# Patient Record
Sex: Female | Born: 1960
Health system: Southern US, Community
[De-identification: ages and names within clinical notes are randomized; demographics above are authoritative.]

## PROBLEM LIST (undated history)

## (undated) ENCOUNTER — Emergency Department (HOSPITAL_BASED_OUTPATIENT_CLINIC_OR_DEPARTMENT_OTHER): Discharge status: Against Medical Advice | Payer: 59

## (undated) DIAGNOSIS — J449 Chronic obstructive pulmonary disease, unspecified: Secondary | ICD-10-CM

## (undated) DIAGNOSIS — K635 Polyp of colon: Secondary | ICD-10-CM

## (undated) DIAGNOSIS — R011 Cardiac murmur, unspecified: Secondary | ICD-10-CM

## (undated) DIAGNOSIS — K760 Fatty (change of) liver, not elsewhere classified: Secondary | ICD-10-CM

## (undated) DIAGNOSIS — F419 Anxiety disorder, unspecified: Secondary | ICD-10-CM

## (undated) DIAGNOSIS — M1990 Unspecified osteoarthritis, unspecified site: Secondary | ICD-10-CM

## (undated) DIAGNOSIS — I10 Essential (primary) hypertension: Secondary | ICD-10-CM

## (undated) DIAGNOSIS — J45909 Unspecified asthma, uncomplicated: Secondary | ICD-10-CM

## (undated) DIAGNOSIS — F32A Depression, unspecified: Secondary | ICD-10-CM

## (undated) DIAGNOSIS — Z8619 Personal history of other infectious and parasitic diseases: Secondary | ICD-10-CM

## (undated) DIAGNOSIS — R748 Abnormal levels of other serum enzymes: Secondary | ICD-10-CM

## (undated) DIAGNOSIS — Z87442 Personal history of urinary calculi: Secondary | ICD-10-CM

## (undated) DIAGNOSIS — J40 Bronchitis, not specified as acute or chronic: Secondary | ICD-10-CM

## (undated) DIAGNOSIS — K529 Noninfective gastroenteritis and colitis, unspecified: Secondary | ICD-10-CM

## (undated) DIAGNOSIS — K219 Gastro-esophageal reflux disease without esophagitis: Secondary | ICD-10-CM

## (undated) DIAGNOSIS — F329 Major depressive disorder, single episode, unspecified: Secondary | ICD-10-CM

## (undated) HISTORY — DX: Noninfective gastroenteritis and colitis, unspecified: K52.9

## (undated) HISTORY — DX: Gastro-esophageal reflux disease without esophagitis: K21.9

## (undated) HISTORY — DX: Personal history of other infectious and parasitic diseases: Z86.19

## (undated) HISTORY — DX: Cardiac murmur, unspecified: R01.1

## (undated) HISTORY — PX: FRACTURE SURGERY: SHX138

## (undated) HISTORY — DX: Abnormal levels of other serum enzymes: R74.8

## (undated) HISTORY — DX: Polyp of colon: K63.5

## (undated) HISTORY — PX: CHOLECYSTECTOMY: SHX55

## (undated) HISTORY — DX: Fatty (change of) liver, not elsewhere classified: K76.0

## (undated) HISTORY — DX: Unspecified osteoarthritis, unspecified site: M19.90

## (undated) HISTORY — PX: NEPHRECTOMY: SHX65

## (undated) HISTORY — PX: TONSILLECTOMY: SUR1361

## (undated) HISTORY — PX: FETAL RADIO FREQUENCY ABLATION: SHX1614

---

## 1999-07-14 ENCOUNTER — Ambulatory Visit (HOSPITAL_COMMUNITY): Admission: RE | Admit: 1999-07-14 | Discharge: 1999-07-14 | Payer: Self-pay | Admitting: Family Medicine

## 1999-07-14 ENCOUNTER — Encounter: Payer: Self-pay | Admitting: Family Medicine

## 1999-12-03 ENCOUNTER — Ambulatory Visit (HOSPITAL_COMMUNITY): Admission: RE | Admit: 1999-12-03 | Discharge: 1999-12-03 | Payer: Self-pay | Admitting: Family Medicine

## 1999-12-03 ENCOUNTER — Encounter: Payer: Self-pay | Admitting: Family Medicine

## 2000-06-09 ENCOUNTER — Other Ambulatory Visit: Admission: RE | Admit: 2000-06-09 | Discharge: 2000-06-09 | Payer: Self-pay | Admitting: *Deleted

## 2001-03-21 ENCOUNTER — Ambulatory Visit (HOSPITAL_BASED_OUTPATIENT_CLINIC_OR_DEPARTMENT_OTHER): Admission: RE | Admit: 2001-03-21 | Discharge: 2001-03-21 | Payer: Self-pay | Admitting: Orthopaedic Surgery

## 2001-07-19 ENCOUNTER — Ambulatory Visit (HOSPITAL_BASED_OUTPATIENT_CLINIC_OR_DEPARTMENT_OTHER): Admission: RE | Admit: 2001-07-19 | Discharge: 2001-07-20 | Payer: Self-pay | Admitting: Orthopedic Surgery

## 2001-07-19 ENCOUNTER — Encounter: Payer: Self-pay | Admitting: Emergency Medicine

## 2003-01-08 ENCOUNTER — Encounter: Payer: Self-pay | Admitting: Family Medicine

## 2003-01-08 ENCOUNTER — Encounter: Admission: RE | Admit: 2003-01-08 | Discharge: 2003-01-08 | Payer: Self-pay | Admitting: Family Medicine

## 2003-01-21 ENCOUNTER — Encounter: Payer: Self-pay | Admitting: General Surgery

## 2003-01-24 ENCOUNTER — Encounter: Payer: Self-pay | Admitting: General Surgery

## 2003-01-24 ENCOUNTER — Ambulatory Visit (HOSPITAL_COMMUNITY): Admission: RE | Admit: 2003-01-24 | Discharge: 2003-01-25 | Payer: Self-pay | Admitting: General Surgery

## 2003-01-24 ENCOUNTER — Encounter (INDEPENDENT_AMBULATORY_CARE_PROVIDER_SITE_OTHER): Payer: Self-pay | Admitting: Specialist

## 2004-12-31 ENCOUNTER — Encounter: Admission: RE | Admit: 2004-12-31 | Discharge: 2005-03-31 | Payer: Self-pay | Admitting: Family Medicine

## 2005-07-07 ENCOUNTER — Ambulatory Visit: Payer: Self-pay | Admitting: Pulmonary Disease

## 2005-12-03 ENCOUNTER — Ambulatory Visit (HOSPITAL_COMMUNITY): Admission: RE | Admit: 2005-12-03 | Discharge: 2005-12-03 | Payer: Self-pay | Admitting: Family Medicine

## 2006-05-04 ENCOUNTER — Ambulatory Visit: Payer: Self-pay | Admitting: Internal Medicine

## 2006-05-20 ENCOUNTER — Ambulatory Visit: Payer: Self-pay | Admitting: Internal Medicine

## 2006-05-20 LAB — PULMONARY FUNCTION TEST

## 2006-05-23 ENCOUNTER — Ambulatory Visit: Payer: Self-pay | Admitting: Internal Medicine

## 2007-04-06 HISTORY — PX: JOINT REPLACEMENT: SHX530

## 2008-10-21 ENCOUNTER — Inpatient Hospital Stay (HOSPITAL_COMMUNITY): Admission: RE | Admit: 2008-10-21 | Discharge: 2008-10-24 | Payer: Self-pay | Admitting: Orthopedic Surgery

## 2009-03-12 ENCOUNTER — Ambulatory Visit (HOSPITAL_COMMUNITY): Admission: RE | Admit: 2009-03-12 | Discharge: 2009-03-12 | Payer: Self-pay | Admitting: Family Medicine

## 2009-06-24 ENCOUNTER — Emergency Department (HOSPITAL_COMMUNITY): Admission: EM | Admit: 2009-06-24 | Discharge: 2009-06-24 | Payer: Self-pay | Admitting: Family Medicine

## 2010-07-12 LAB — CBC
HCT: 36.9 % (ref 36.0–46.0)
Hemoglobin: 10.5 g/dL — ABNORMAL LOW (ref 12.0–15.0)
Hemoglobin: 12.1 g/dL (ref 12.0–15.0)
MCHC: 33.4 g/dL (ref 30.0–36.0)
MCHC: 33.6 g/dL (ref 30.0–36.0)
Platelets: 225 10*3/uL (ref 150–400)
Platelets: 244 10*3/uL (ref 150–400)
Platelets: 245 10*3/uL (ref 150–400)
RBC: 3.47 MIL/uL — ABNORMAL LOW (ref 3.87–5.11)
RBC: 3.81 MIL/uL — ABNORMAL LOW (ref 3.87–5.11)
RDW: 13.9 % (ref 11.5–15.5)
RDW: 14.1 % (ref 11.5–15.5)
RDW: 14.5 % (ref 11.5–15.5)
RDW: 14.3 % (ref 11.5–15.5)
WBC: 7.8 10*3/uL (ref 4.0–10.5)
WBC: 5.6 10*3/uL (ref 4.0–10.5)

## 2010-07-12 LAB — BASIC METABOLIC PANEL
BUN: 1 mg/dL — ABNORMAL LOW (ref 6–23)
BUN: 4 mg/dL — ABNORMAL LOW (ref 6–23)
CO2: 31 mEq/L (ref 19–32)
Calcium: 8.2 mg/dL — ABNORMAL LOW (ref 8.4–10.5)
Calcium: 8.4 mg/dL (ref 8.4–10.5)
Calcium: 8.4 mg/dL (ref 8.4–10.5)
Creatinine, Ser: 0.49 mg/dL (ref 0.4–1.2)
Creatinine, Ser: 0.53 mg/dL (ref 0.4–1.2)
Creatinine, Ser: 0.62 mg/dL (ref 0.4–1.2)
GFR calc Af Amer: >60 mL/min (ref >60)
GFR calc Af Amer: >60 mL/min (ref >60)
GFR calc non Af Amer: >60 mL/min (ref >60)
GFR calc non Af Amer: >60 mL/min (ref >60)
Glucose, Bld: 108 mg/dL — ABNORMAL HIGH (ref 70–99)
Potassium: 3.2 mEq/L — ABNORMAL LOW (ref 3.5–5.1)
Sodium: 140 mEq/L (ref 135–145)

## 2010-07-12 LAB — COMPREHENSIVE METABOLIC PANEL
ALT: 53 U/L — ABNORMAL HIGH (ref 0–35)
Albumin: 3.3 g/dL — ABNORMAL LOW (ref 3.5–5.2)
Alkaline Phosphatase: 145 U/L — ABNORMAL HIGH (ref 39–117)
BUN: 12 mg/dL (ref 6–23)
Chloride: 107 mEq/L (ref 96–112)
Potassium: 4 mEq/L (ref 3.5–5.1)
Sodium: 141 mEq/L (ref 135–145)
Total Bilirubin: 0.8 mg/dL (ref 0.3–1.2)
Total Protein: 6.6 g/dL (ref 6.0–8.3)

## 2010-07-12 LAB — URINALYSIS, ROUTINE W REFLEX MICROSCOPIC
Bilirubin Urine: NEGATIVE
Glucose, UA: NEGATIVE mg/dL
Hgb urine dipstick: NEGATIVE
Specific Gravity, Urine: 1.017 (ref 1.005–1.030)
pH: 7.5 (ref 5.0–8.0)

## 2010-07-12 LAB — PROTIME-INR
INR: 1.1 (ref 0.00–1.49)
INR: 1.2 (ref 0.00–1.49)
INR: 1.9 — ABNORMAL HIGH (ref 0.00–1.49)
Prothrombin Time: 14 seconds (ref 11.6–15.2)
Prothrombin Time: 15.9 seconds — ABNORMAL HIGH (ref 11.6–15.2)
Prothrombin Time: 23.3 seconds — ABNORMAL HIGH (ref 11.6–15.2)

## 2010-07-12 LAB — PREGNANCY, URINE: Preg Test, Ur: NEGATIVE

## 2010-07-12 LAB — TYPE AND SCREEN
ABO/RH(D): O POS
Antibody Screen: NEGATIVE

## 2010-08-18 NOTE — Discharge Summary (Signed)
[name], [name]                ACCOUNT NO.:  [id number]   MEDICAL RECORD NO.:  [id number]          PATIENT TYPE:  INP   LOCATION:  1601                         FACILITY:  [facility]   PHYSICIAN:  Ollen Gross, M.D.    DATE OF BIRTH:  01/19/1961   DATE OF ADMISSION:  10/21/2008  DATE OF DISCHARGE:  10/24/2008                               DISCHARGE SUMMARY   ADMITTING DIAGNOSES:  1. Osteoarthritis, left knee.  2. Past history of shingles.  3. Hypertension.  4. Hypercholesterolemia.  5. Hiatal hernia.  6. Hemorrhoids.  7. History of renal calculi.  8. History of renal cell carcinoma.  9. Past history of left hip fracture.   DISCHARGE DIAGNOSES:  1. Osteoarthritis, left knee, status post left total knee replacement      arthroplasty.  2. Postoperative hypokalemia.  3. Postoperative hyponatremia.  4. Past history of shingles.  5. Hypertension.  6. Hypercholesterolemia.  7. Hiatal hernia.  8. Hemorrhoids.  9. History of renal calculi.  10.History of renal cell carcinoma.  11.Past history of left hip fracture.   PROCEDURE:  On October 21, 2008, left total knee.  Surgeon Dr. Lequita Halt.  Assistant Avel Peace, P.A.-C.  Spinal anesthesia with Duramorph added.  Tourniquet time of 36 minutes.   CONSULTS:  None.   BRIEF HISTORY:  [name] is a 50 year old female with end-stage  arthritis of the left knee, progressively getting worse with time.  She  has failed nonoperative management including injection and now presents  for a total knee arthroplasty.   LABORATORY DATA:  Preoperative CBC showed a hemoglobin of 12.1,  hematocrit of 36.9, white cell count 5.6, platelets 245.  Chem panel on  admission:  Low albumin of 3.3, elevated AST of 60, elevated ALT of 53,  elevated alkaline phosphatase of 145.  PT/INR 13.1 and 1.0, PTT of 28.  Preop UA was negative.  Serial CBCs were followed throughout the  hospital course.  Hemoglobin dropped down to 11.5, then 10.9, last noted  at 10.5.   Serial BMET were followed.  Sodium did drop from 133 and came  back at 140.  Potassium dropped down to 3.2, last noted at 3.1.  She was  on potassium supplements and would recheck on an outpatient basis.  Serial pro times followed per Coumadin protocol.  Last noted PT/INR 23.3  and 1.9.   X-rays:  Chest x-ray showed no acute cardiopulmonary disease.  No change  from previous study.   EKG September 25, 2008:  Sinus rhythm, probably normal, was confirmed, unable  to read signature.   HOSPITAL COURSE:  The patient admitted to the [facility],  taken to the OR, underwent the above-stated procedure without  complication.  The patient tolerated the procedure well and later  transferred to the recovery room and orthopedic floor on PCA and p.o.  analgesics.  Given 24 hours postoperative IV antibiotics.  Did have  spinal with Duramorph added.  Doing pretty well on the morning of day 1.  Had some pain last night, especially after the spinal wore off.  A  little bit better on  the morning of day 1, started getting up out of  bed.  Hemoglobin was stable.  Sodium was a little low so decreased  fluids.  The patient was doing a little bit better by day 2.  She was up  ambulating over 60 feet.  Dressing changed, incision looked good.  Potassium was a little low so put on potassium supplements.  By day 3,  she was progressing well.  Potassium was still low.  She was on  potassium supplements when she went home.  Recheck on an outpatient  basis but meeting her goals and discharged home.   DISCHARGE INSTRUCTIONS:  1. The patient was discharged home on October 24, 2008.  2. Discharge diagnoses:  Please see above.  3. Discharge medications:  Darvocet, Coumadin and Robaxin.  4. Follow up in 2 weeks.   ACTIVITY:  Total knee protocol.  Home health PT.  Home health nursing.  Weightbearing as tolerated.   DISPOSITION:  Home.   CONDITION ON DISCHARGE:  Improved.      Alexzandrew L. Perkins,  P.A.C.      Ollen Gross, M.D.  Electronically Signed    ALP/MEDQ  D:  10/24/2008  T:  10/24/2008  Job:  [id number]   cc:   Ollen Gross, M.D.  Fax: [phone]   Dalbert Mayotte, M.D.

## 2010-08-18 NOTE — Op Note (Signed)
[name], [name]                ACCOUNT NO.:  [id number]   MEDICAL RECORD NO.:  [id number]          PATIENT TYPE:  INP   LOCATION:  0005                         FACILITY:  [facility]   PHYSICIAN:  Ollen Gross, M.D.    DATE OF BIRTH:  Nov 03, 1960   DATE OF PROCEDURE:  10/21/2008  DATE OF DISCHARGE:                               OPERATIVE REPORT   PREOPERATIVE DIAGNOSIS:  Osteoarthritis, left knee.   POSTOPERATIVE DIAGNOSIS:  Osteoarthritis, left knee.   PROCEDURE:  Left total knee arthroplasty.   SURGEON:  Dr. Lequita Halt.   ASSISTANT:  Avel Peace. P.A.-C.   ANESTHESIA:  Spinal with Duramorph.   ESTIMATED BLOOD LOSS:  Minimal.   DRAINS:  None.   TOURNIQUET TIME:  36 minutes at 300 mmHg.   COMPLICATIONS:  None.   CONDITION:  Stable to recovery.   BRIEF CLINICAL NOTE:  [name] is a 50 year old female who has end-stage  arthritis of the left knee with progressively worsening pain and  dysfunction.  She has failed nonoperative management including  injections and presents now for left total knee arthroplasty.   PROCEDURE IN DETAIL:  After successful administration of spinal  anesthetic, a tourniquet is placed on her left thigh, and left lower  extremity was prepped and draped in the usual sterile fashion.  Extremity is wrapped in Esmarch, knee flexed, tourniquet inflated to 300  mmHg.  Midline incision is made with a 10 blade through subcutaneous  tissue to the level of the extensor mechanism.  A fresh blade is used  make a medial parapatellar arthrotomy.  Soft tissue on the proximal  medial tibia subperiosteally elevated to the joint line with the knife  into the semimembranosus bursa with a Cobb elevator.  Soft tissue  laterally is elevated with attention being paid to avoiding patellar  tendon on the tibial tubercle.  Patella subluxed laterally, knee flexed  90 degrees and ACL and PCL removed.  Drill is used create a starting  hole in the distal femur, and the canal was  thoroughly irrigated.  A 5-  degree left valgus alignment guide is placed and referencing off the  posterior condyles rotations marked and a block pinned to remove 10 mm  off the distal femur.  Distal femoral resection is made with an  oscillating saw.  Sizing block is placed and size 2.5 is most  appropriate.  Rotation is marked off the epicondylar axis.  The size 2.5  cutting block is placed, and the anterior, posterior and chamfer cuts  were made.   The tibia is subluxed forward, and the menisci are removed.  Extramedullary tibial alignment guide is placed referencing proximally  at the medial aspect of the tibial tubercle and distally along the  second metatarsal axis of the tibial crest.  Block is pinned to remove  about 10 mm off the nondeficient lateral side.  Tibial resection is made  with an oscillating saw.  Sizing guide is placed, and size 2.5 is most  appropriate.  The proximal tibia is then prepared with a modular drill  and keel punch for the size 2.5.  Femoral  preparation is then completed  with the intercondylar cut.   Size 2.5 mobile bearing tibial trial, 2.5 posterior stabilized femoral  trial and a 10-mm posterior stabilized rotating platform insert trial  are placed.  With a 10 she, hyperextends a tiny bit.  We subsequently  went to a 12.5 which allowed for full extension with excellent varus-  valgus and anterior-posterior balance throughout full range of motion.  Patella is everted and thickness measured to be 23 mm.  Freehand  resection taken to 13 mm, 35 template is placed, lug holes are drilled,  trial patella is placed and it tracks normally.  Osteophytes are removed  off the posterior femur with the trial in place.  All trials are  removed, and the cut bone surfaces are prepared with pulsatile lavage.  Cement is mixed, and once ready for implantation, the size 2.5 mobile  bearing tibial tray, 2.5 posterior stabilized femur and 35 patella are  cemented in  place, and the patella is held with a clamp.  Trial 12.5-mm  insert is placed, knee held in full extension and all extruded cement  removed.  When the cement is fully hardened, then the permanent 12.5 mm  posterior stabilized rotating platform insert is placed into the tibial  tray.  The wound is copiously irrigated with saline solution and the  FloSeal injected on the posterior capsule, mediolateral gutters and  suprapatellar area.  Moist sponge is placed and tourniquet released for  a total time of 36 minutes.  Sponges removed after 2 minutes.  Minimal  bleeding is encountered.  The bleeding that is encountered is stopped  with electrocautery.  The wound is again irrigated to remove the FloSeal  and then the arthrotomy closed with interrupted #1 PDS.  Flexion against  gravity to 140 degrees.  Subcutaneous tissue closed with interrupted 2-0  Vicryl and subcuticular running 4-0 Monocryl.  The incision is then  cleaned and dried, and Steri-Strips and a bulky sterile dressing are  applied.  She is then placed into a knee immobilizer, awakened and  transferred to recovery in stable condition.      Ollen Gross, M.D.  Electronically Signed     FA/MEDQ  D:  10/21/2008  T:  10/21/2008  Job:  [id number]

## 2010-08-21 NOTE — Op Note (Signed)
NAME:  [name], [name]                          ACCOUNT NO.:  [id number]   MEDICAL RECORD NO.:  [id number]                   PATIENT TYPE:  OIB   LOCATION:  2888                                 FACILITY:  [facility]   PHYSICIAN:  Adolph Pollack, M.D.            DATE OF BIRTH:  1960/06/30   DATE OF PROCEDURE:  01/24/2003  DATE OF DISCHARGE:                                 OPERATIVE REPORT   PREOPERATIVE DIAGNOSES:  Symptomatic cholelithiasis and elevated liver  function tests.   POSTOPERATIVE DIAGNOSES:  Symptomatic cholelithiasis and elevated liver  function tests.   PROCEDURE:  1. Laparoscopic cholecystectomy with intraoperative cholangiogram.  2. A wedge liver biopsy.   SURGEON:  Adolph Pollack, M.D.   ASSISTANT:  Anselm Pancoast. Zachery Dakins, M.D.   ANESTHESIA:  General.   INDICATIONS FOR PROCEDURE:  The patient is a 50 year old female with biliary  colic-type pain.  She is noted to have elevation of alkaline phosphatase,  AST, ALT and cholesterol.  An ultrasound performed demonstrated multiple  gallstones, but no common bile duct dilatation, and no gallbladder wall  thickening.  She presents now for an elective cholecystectomy.  We also  discussed, in the holding area, the possibility of needing to do a liver  biopsy, if we could not explain her findings by way of a cholangiogram or  during the cholecystectomy.   DESCRIPTION OF PROCEDURE:  She is seen in the holding area and then brought  to the operating room and placed supine on the operating room table, and a  general anesthetic was administered.  Her abdominal wall was sterilely  prepped and draped.  Local anesthetic consisting of dilute Marcaine was  infiltrated in the subumbilical region, and a longitudinal small  subumbilical scar was made, incising the skin and  the subcutaneous tissue  sharply until the midline fascia was identified.  A small incision was made  in the midline fascia, and then using blunt  dissection, the peritoneal  cavity was entered under direct vision.  A pursestring suture of #0 Vicryl  was placed around the fascial edges.  A Hasson trocar was introduced into  the peritoneal cavity, and a pneumoperitoneum was created by the  insufflation of CO2 gas.  The laparoscope was introduced, and she was placed in a reverse  Trendelenburg position with the right side tilted slightly up.  Under direct  vision, a #11 mm trocar was placed through an epigastric incision, and two 5  mm trocars were placed through the right mid-lateral abdomen.  The fundus of  the gallbladder was grasped, and then adhesions between the duodenum and  omentum and the body of the gallbladder were noted and were lysed sharply.  The fundus was then retracted toward the right shoulder, and the  infundibulum grasped.  Using careful blunt dissection and select cautery,  the infundibulum was mobilized.  There is an anterior branch of the cystic  artery coursing  over the cystic duct, that was clipped and divided.  A  window was then created around the cystic duct, and a clip placed at the  cystic duct/gallbladder junction.  A small incision was created in the  cystic duct.  A cholangiocatheter was passed through the anterior abdominal  wall and placed into the cystic duct, and a cholangiogram was performed.  Under real time fluoroscopy dilute contrast material was injected into the  cystic duct, which was of moderate length.  The common hepatic, right and  left hepatic, and the common bile ducts all filled promptly, and contrast  was splashed into the duodenum promptly from the common bile duct without  obvious evidence of obstruction.  The final report is pending the  radiologist's interpretation.  The cholangiocatheter was removed.  The cystic duct was clipped three times,  on the staying inside, and then divided sharply.  The posterior branch of  the cystic artery was identified, clipped, and divided.  The  gallbladder was  then dissected free from the liver bed with electrocautery, and placed in an  Endo Pouch bag.  Because I could not find an obvious reason for  the elevated liver function  tests, based on what I had seen so far, I performed a wedge liver biopsy  bluntly, and placed this in formalin and sent it to pathology.  I then  cauterized the biopsy site.  I inspected the gallbladder fossa.  Bleeding points were controlled with  cautery.  I inspected again and irrigated out the area.  I noticed no  further bleeding.  I noticed no bile leak.  I then evacuated the fluid.  The  gallbladder was then removed in the Endo Pouch bag with the subumbilical  incision.  Under laparoscopic vision the subumbilical fascial defect was  closed by tightening up and tightening down the pursestring suture.  The  remaining trocars were removed, and the pneumoperitoneum was released.  The skin incisions were closed with #4-0 Monocryl subcuticular stitches.  Steri-Strips and sterile dressings were applied.  She tolerated the procedure well without any apparent complications, and was  taken to the recovery room in satisfactory condition.                                                Adolph Pollack, M.D.    Kari Baars  D:  01/24/2003  T:  01/24/2003  Job:  [id number]   cc:   Duncan Dull, M.D.  [address]  [place]  [place] [address]  Fax: [phone]

## 2010-08-21 NOTE — Assessment & Plan Note (Signed)
[facility]                             PULMONARY OFFICE NOTE   NAME:TERRELLHanya, [name]                       MRN:          [id number]  DATE:05/04/2006                            DOB:          [birth year]    PROBLEM:  A 50 year old woman, self referred, for evaluation of  pulmonary nodules and cough.   HISTORY:  She is a nonsmoker who has had several episodes of chest  tightness, shortness of breath, and cough particularly associated with  fall season.  With these episodes she also tends to have nasal  congestion and sneezing.  There was a past history of skin test positive  allergy evaluation and trial of allergy vaccine as a child.  She was  seen in April of 2007 by Dr. Danice Goltz who thought she might be  describing esophageal reflux and mild asthma with rhinitis, treated then  with Advair, Zegerid and Nasonex.  She had a normal spirometry at that  visit with an FEV1 of 117% predicted.  Subsequently, she had workup for  chest pain with CT that ruled out a pulmonary embolism.  I did not quite  follow the sequence, but with these evaluations she was found to have a  mass on her left kidney, and ultimately had a partial left nephrectomy  for renal cell carcinoma.  Her first CT scan had been done at [facility].  A second one was done at [facility] in December of 2007.  We do not  have the radiology reports, but that second CT reportedly showed some  small nodules.  It is not clear if they were seen on the first film done  at a different location.  Her primary concern now is with the nodules,  given her history of renal cell carcinoma.  Currently, she feels well.   MEDICATIONS:  1. Nexium 40 mg p.r.n.  2. Lexapro 10 mg.  3. Hydrochlorothiazide 25 mg.  4. Advair 250/50.  5. Albuterol resource inhaler.   No medication allergy.   REVIEW OF SYSTEMS:  Cough is productive of white sputum in the mornings.  Acid indigestion, usually prevented with her  Nexium.  Mild dyspnea,  which she thinks is probably normal, noticed mostly when she first tries  to climb 3 flights of stairs in to work in the morning.  She walks about  3 times a week to walk her mother, implying that this is not very  aggressive walking.  She does feel tight in the chest in cold air.  Nasal congestion, some nasal itching, and itching of skin without  visible rash.  She has not noticed bleeding, adenopathy, fever, or  weight loss.   PAST HISTORY:  1. Hypertension.  2. Asthma.  3. Renal cell carcinoma with left partial nephrectomy, July 2007.  4. Allergic rhinitis with allergy vaccine as a child.  5. Esophageal reflux.  6. Chronically elevated liver enzymes with negative workup for viral      infections.  She understands diagnosis was fatty liver.  7. Surgery for cholecystectomy and her nephrectomy.  8. Tuberculosis skin test negative, no history of  pneumonia.  No      history of blood clots or heart disease.   SOCIAL HISTORY:  Never smoked, no alcohol.  Divorced with a son who is  in his early 64s.  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   FAMILY HISTORY:  Her son is on allergy vaccine per Dr. Lucie Leather.  Father  and sister have allergy histories.  Father with asthma and heart  disease.  Maternal grandmother with rheumatism.  Father with prostate  cancer.   OBJECTIVE:  Weight 189 pounds, BP 122/78, pulse 75, room air saturation  98%.  She is overweight, pleasant, and seemingly quite comfortable.  SKIN:  No rash.  ADENOPATHY:  None found at the neck, shoulders, or axillae.  HEENT:  Crusting mucous in the nose without obstruction.  Pharynx clear,  voice quality normal.  No neck vein distension.  Conjunctivae not  injected.  CHEST:  Quiet, clear lung fields.  No cough or wheeze.  There is a  healed left flank incision.  HEART:  Regular rhythm without murmur or gallop.   EXTREMITIES:  No cyanosis, clubbing, or edema.   RADIOLOGY:  She brings a folder of prior CT scans and ultrasound results  with no reports.  Brief glance through the lung window views on her  chest CT does not show obvious nodules.  I will review the films again  when reports are available.   IMPRESSION:  1. Asthmatic bronchitis.  2. Allergic rhinitis.  3. Left partial nephrectomy for renal cell carcinoma, 2007.  4. Small lung nodules as incidental finding on chest CT.   PLAN:  1. She is going to provide CT scan reports for both prior studies.  2. Schedule return in 3 weeks, at which time we will plan either a      followup CT scan or PET scan going forward.  3. Schedule pulmonary function tests.  4. I will try to get addresses so we can send records to her other      treating physicians.     Clinton D. Maple Hudson, MD, Tonny Bollman, FACP  Electronically Signed    CDY/MedQ  DD: 05/04/2006  DT: 05/04/2006  Job #: [id number]   cc:   Dalbert Mayotte, M.D.  Bernie Covey, MD  Althea Charon, MD

## 2010-08-21 NOTE — Assessment & Plan Note (Signed)
[facility]                             PULMONARY OFFICE NOTE   NAME:[name], [name]                       MRN:          [id number]  DATE:05/23/2006                            DOB:          [birth year]    PROBLEM:  1. Small lung nodules.  2. Asthmatic bronchitis.  3. Allergic rhinitis.  4. Left partial nephrectomy for renal cell carcinoma 2007.  5. Esophageal reflux.   HISTORY:  She brings the CT scans that she had.  I am not impressed with  the one that I read from December 7, but I do not have the Radiology  reports.  There was hilar prominence noted no chest x-ray of November  2007.  She feels well, but does need to continue using her Advair, and  occasionally needs her albuterol.   MEDICATIONS:  1. Nexium 40 mg.  2. Lexapro 10 mg.  3. Hydrochlorothiazide 25 mg.  4. Advair 250/50.  5. Rescue albuterol inhaler.   NO MEDICATION ALLERGY.   OBJECTIVE:  Weight 191 pounds.  BP 118/64.  Pulse 69.  Room air  saturation 98%.  She looks well.  I find no adenopathy.  LUNG FIELDS:  Clear.  HEART:  Sounds normal.  There is no edema.  Pulmonary function test May 20, 2006 showed mild obstructive change  only in small airways where there was significant response to  bronchodilator.  Measured lung volumes and diffusion were normal.  On a  6-minute walk test she went 492 meters with oxygen saturation maintained  and normal blood pressure response.   IMPRESSION:  1. Mild asthma.  2. History of small lung nodules and hilar prominence significant in      this nonsmoker, particularly because of her history of renal cell      carcinoma.   PLAN:  1. She is going to continue Advair, and use it once a day if that is      sufficient.  We will try to get the Radiology reports from her CT      scans, and make a decision about whether she needs      to be followed with CT or could be adequately followed with      occasional chest x-ray.  2. Schedule  return in 6 months, earlier p.r.n.     Clinton D. Maple Hudson, MD, Tonny Bollman, FACP  Electronically Signed    CDY/MedQ  DD: 05/28/2006  DT: 05/28/2006  Job #: [id number]   cc:   Dalbert Mayotte, M.D.

## 2010-08-21 NOTE — Op Note (Signed)
[place]  Patient:    [name], [name] Visit Number: [id number] MRN: [id number]          Service Type: Attending:  Lubertha Basque. Jerl Santos, M.D. Dictated by:   Lubertha Basque Jerl Santos, M.D. Proc. Date: 03/21/01                             Operative Report  PREOPERATIVE DIAGNOSIS:  Left knee chondromalacia.  POSTOPERATIVE DIAGNOSIS:  Left knee chondromalacia.  OPERATION PERFORMED: 1. Left knee chondroplasty medial femoral condyle and patellofemoral joint. 2. Left knee arthroscopic lateral release.  ANESTHESIA:  General.  ATTENDING SURGEON:  Lubertha Basque. Jerl Santos, M.D.  ASSISTANT:  Lindwood Qua, P.A.  INDICATIONS FOR PROCEDURE:  The patient is a 50 year old woman with a long history of left knee pain.  This has made it difficult for her to exercise and it bothers her at rest as well.  She has failed an exercise program and an oral anti-inflammatory.  At this point she is offered an arthroscopy.  The procedure was discussed with the patient and informed operative consent was obtained after discussion of possible complications of reaction to anesthesia and infection.  DESCRIPTION OF PROCEDURE:  The patient was taken to an operating suite where general anesthetic was applied with LMA.  She was then positioned supine and prepped and draped in normal sterile fashion.  After administration of preop intravenous antibiotics, an arthroscopy of the left knee was performed through a total of three portals.  The suprapatellar pouch was benign while the patellofemoral joint did track in a lateral position.  This was addressed with an arthroscopic lateral release through the additional third portal.  Once this was accomplished, the knee cap tracked in a much better position.  She also had some break down of the intertrochlear groove cartilage which was minor, involved some grade three change.  The medial compartment was notable for some grade 2 and grade 3 change  over about half the medial femoral condyle.  This was addressed with a thorough chondroplasty.  The medial meniscus itself was benign.  The ACL and the PCL were intact.  The lateral compartment was completely benign.  The knee was thoroughly irrigated at the end of the case followed by placement of Marcaine with epinephrine and morphine.  Adaptic was placed over her portals followed by dry gauze and a loose Ace wrap.  Estimated blood loss and intraoperative fluids can be obtained from Anesthesia records.  DISPOSITION:  The patient was extubated in the operating room and taken to the recovery room in stable condition.  Plans were for her to go home the same day and to follow up in the office in less than a week.  I will contact her by phone tonight. Dictated by:   Lubertha Basque Jerl Santos, M.D. Attending:  Lubertha Basque. Jerl Santos, M.D. DD:  03/21/01 TD:  03/21/01 Job: [id number]

## 2010-08-21 NOTE — H&P (Signed)
[name], [name]                ACCOUNT NO.:  [id number]   MEDICAL RECORD NO.:  [id number]           PATIENT TYPE:  INP   LOCATION:                               FACILITY:  [facility]   PHYSICIAN:  Ollen Gross, M.D.    DATE OF BIRTH:  [birth year]   DATE OF ADMISSION:  10/21/2008  DATE OF DISCHARGE:                              HISTORY & PHYSICAL   CHIEF COMPLAINT:  Left knee pain.   HISTORY OF PRESENT ILLNESS:  The patient is a 50 year old female who has  seen by Dr. Lequita Halt for ongoing knee pain.  She has been diagnosed with  arthritis that has been progressively getting worse with time.  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  Progressive pain, felt to be a good candidate.  Risks and benefits discussed.  The patient subsequently admitted to the  hospital.   ALLERGIES:  NO KNOWN DRUG ALLERGIES.   CURRENT MEDICATIONS:  Hydrochlorothiazide, potassium, citalopram.   PAST MEDICAL HISTORY:  1. Past history of shingles.  2. Hypertension.  3. Hypercholesterolemia.  4. Hiatal hernia.  5. Hemorrhoids.  6. History of renal calculi.  7. History of renal cell carcinoma.  8. Past history of a left hip fracture.   PAST SURGICAL HISTORY:  1. Tonsillectomy.  2. __________ dilatation.  3. Exploratory lap with ectopic pregnancy.  4. Left lateral release knee scope.  5. Left ankle fracture, ORIF.  6. Gallbladder surgery.  7. Left partial nephrectomy.   FAMILY HISTORY:  Father with arthritis, heart failure, coronary artery  disease and diabetes.  Mother with dementia.   SOCIAL HISTORY:  Married, Public affairs consultant, nonsmoker, one to  two drinks of alcohol per week, one son.   REVIEW OF SYSTEMS:  GENERAL:  No fevers, chills, night sweats.  NEURO:  No seizures, syncope or paralysis.  RESPIRATORY:  No shortness breath,  productive cough or hemoptysis.  CARDIOVASCULAR:  No chest pain, angina,  orthopnea.  GI:  No  nausea, diarrhea, constipation.  GU:  No dysuria,  hematuria or discharge.  MUSCULOSKELETAL:  Joint pain and swelling.   PHYSICAL EXAMINATION:  VITAL SIGNS:  Pulse 76, respirations 14, blood  pressure 142/76.  GENERAL:  A 50 year old white female well-nourished, well-developed, no  acute distress.  She is alert, oriented and cooperative.  HEENT:  Normocephalic/atraumatic, pupils are round and reactive,  oropharynx clear, EOMs intact.  NECK:  Supple.  CHEST: Clear.  HEART:  Regular rate and rhythm with a faint early systolic ejection  murmur noted.  ABDOMEN:  Soft, nontender, bowel sounds present.  RECTAL, BREASTS, GENITALIA:  Not done, not pertinent to present illness.  EXTREMITIES:  Left knee no effusion, marked crepitus, varus malalignment  deformity.   IMPRESSION:  Osteoarthritis of the left knee.   PLAN:  The patient admitted to [facility] to undergo left  total knee replacement arthroplasty.  Surgery will be performed by Dr.  Ollen Gross.      Alexzandrew L. Perkins, P.A.C.      Homero Fellers Aluisio,  M.D.  Electronically Signed    ALP/MEDQ  D:  10/21/2008  T:  10/21/2008  Job:  [id number]   cc:   Dr. Anderson Malta [facility]  Alfonse Flavors, M.D.  Fax: [phone]

## 2010-08-21 NOTE — Op Note (Signed)
[facility]  Patient:    [name], [name] Visit Number: [id number] MRN: [id number]          Service Type: EMS Location: [facility] Attending Physician:  Hanley Seamen Dictated by:   Alinda Deem, M.D. Proc. Date: 07/19/01 Admit Date:  07/19/2001 Discharge Date: 07/19/2001                             Operative Report  PREOPERATIVE DIAGNOSIS:  Left ankle supination external rotation type 4 fracture, subluxation.  POSTOPERATIVE DIAGNOSIS:  Left ankle supination external rotation type 4 fracture, subluxation.  OPERATION PERFORMED:  Open reduction internal fixation using the Depuy small fragment titanium screw and plate set.  A 6-hole one third tubular lateral plate with a single anterior to posterior lag screw.  SURGEON:  Alinda Deem, M.D.  ASSISTANT:  Dorthula Matas, P.A.-C.  ANESTHESIA:  General endotracheal.  ESTIMATED BLOOD LOSS:  Minimal.  FLUID REPLACEMENT:  800 cc crystalloid.  TOURNIQUET TIME:  35 minutes.  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  She presented to the [facility] Emergency Department.  X-rays revealed lateral subluxation of the talus beneath the tibia and she was prepared for surgical intervention to stabilize the ankle, decrease pain and increase function.  DESCRIPTION OF PROCEDURE:  The patient was identified by arm band and taken to the operating room at [facility] where the appropriate anesthetic monitors were attached and general endotracheal anesthesia induced with the patient in the supine position.  Tourniquet was applied high to the left calf. The left lower extremity was prepped and draped in the usual sterile fashion from the toes to the tourniquet.  The limb was wrapped with an Esmarch bandage.  Tourniquet inflated to 300 mHg and we began  the procedure by making an 8 cm lateral incision starting at the tip of the lateral malleolus and going proximally for about 8 cm.  Small bleeders in the skin and subcutaneous tissues were identified and cauterized.  We dissected down to the bone of the fibula using the tenotomy scissors being careful to avoid any significant branches of the superficial peroneal nerve or the sural nerve.  We immediately identified the spiral fracture of the fibula, made a longitudinal incision in the periosteum, reflected it anteriorly and posteriorly at the fracture site and then washed out the fracture site with normal saline solution.  Using a lions jaw clamp, we were able to reduce the fibula fracture and then placed an anterior proximal to posterior distal 3.5 mm cortical lag screw that I believe was 16 mm in length.  A 6-hole one third tubular titanium plate was then contoured to fit the lateral aspect of the fibula and fixed with three proximal bicortical screws, a fourth unicortical screw and then holes 5 and 6 were filled with 4 mm cancellous screws.  C-arm images were taken confirming anatomic reduction of the fracture and an intact mortise joint.  A hook test revealed the syndesmosis to be intact.  At this point the tourniquet was let down.  The wound was washed out with normal saline solution.  The subcutaneous tissue was closed with running 3-0 Vicryl suture and the skin with running interlocking 3-0 nylon suture.  A dressing of Xeroform, 4 x 4 dressing sponges, Webril  and an Ace wrap applied.  The patient was was then placed back in a cam walker boot, awakened and taken to the recovery room without difficulty. Dictated by:   Alinda Deem, M.D. Attending Physician:  Hanley Seamen DD:  07/19/01 TD:  07/20/01 Job: [id number]

## 2010-08-29 ENCOUNTER — Emergency Department (HOSPITAL_COMMUNITY)
Admission: EM | Admit: 2010-08-29 | Discharge: 2010-08-29 | Disposition: A | Discharge status: Home / Self Care | Payer: Commercial Managed Care - PPO | Attending: Emergency Medicine | Admitting: Emergency Medicine

## 2010-08-29 DIAGNOSIS — F410 Panic disorder [episodic paroxysmal anxiety] without agoraphobia: Secondary | ICD-10-CM | POA: Insufficient documentation

## 2010-08-29 DIAGNOSIS — I10 Essential (primary) hypertension: Secondary | ICD-10-CM | POA: Insufficient documentation

## 2010-08-29 DIAGNOSIS — R209 Unspecified disturbances of skin sensation: Secondary | ICD-10-CM | POA: Insufficient documentation

## 2010-08-29 DIAGNOSIS — Z79899 Other long term (current) drug therapy: Secondary | ICD-10-CM | POA: Insufficient documentation

## 2010-08-29 DIAGNOSIS — R000 Tachycardia, unspecified: Secondary | ICD-10-CM | POA: Insufficient documentation

## 2010-08-29 DIAGNOSIS — Z85528 Personal history of other malignant neoplasm of kidney: Secondary | ICD-10-CM | POA: Insufficient documentation

## 2011-04-09 DIAGNOSIS — M170 Bilateral primary osteoarthritis of knee: Secondary | ICD-10-CM | POA: Insufficient documentation

## 2011-04-09 DIAGNOSIS — Z889 Allergy status to unspecified drugs, medicaments and biological substances status: Secondary | ICD-10-CM | POA: Insufficient documentation

## 2011-04-09 DIAGNOSIS — Z85528 Personal history of other malignant neoplasm of kidney: Secondary | ICD-10-CM | POA: Insufficient documentation

## 2011-04-09 DIAGNOSIS — C649 Malignant neoplasm of unspecified kidney, except renal pelvis: Secondary | ICD-10-CM | POA: Insufficient documentation

## 2011-04-09 HISTORY — DX: Bilateral primary osteoarthritis of knee: M17.0

## 2011-05-09 ENCOUNTER — Encounter: Payer: Self-pay | Admitting: *Deleted

## 2011-05-09 ENCOUNTER — Emergency Department (INDEPENDENT_AMBULATORY_CARE_PROVIDER_SITE_OTHER)
Admission: EM | Admit: 2011-05-09 | Discharge: 2011-05-09 | Disposition: A | Discharge status: Home / Self Care | Payer: 59 | Source: Home / Self Care | Attending: Emergency Medicine | Admitting: Emergency Medicine

## 2011-05-09 DIAGNOSIS — R112 Nausea with vomiting, unspecified: Secondary | ICD-10-CM

## 2011-05-09 HISTORY — DX: Depression, unspecified: F32.A

## 2011-05-09 HISTORY — DX: Anxiety disorder, unspecified: F41.9

## 2011-05-09 HISTORY — DX: Major depressive disorder, single episode, unspecified: F32.9

## 2011-05-09 MED ORDER — PROMETHAZINE HCL 12.5 MG PO TABS: 12.5000 mg | ORAL_TABLET | Freq: Four times a day (QID) | ORAL | Status: AC | PRN

## 2011-05-09 NOTE — ED Provider Notes (Signed)
History     CSN: [id number]  Arrival date & time 05/09/11  1140   First MD Initiated Contact with Patient 05/09/11 1303      Chief Complaint  Patient presents with  . Emesis  . Generalized Body Aches    (Consider location/radiation/quality/duration/timing/severity/associated sxs/prior treatment) HPI [name] is a 51 y.o. female who complains of onset of cold symptoms for 1 days. 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 She did have her flu shot about 3-4 months ago. She was exposed to her sister who tested positive for flu about 2 weeks ago.   No sore throat No cough No pleuritic pain No wheezing No nasal congestion No post-nasal drainage No sinus pain/pressure No chest congestion No itchy/red eyes No earache No hemoptysis No SOB No chills/sweats No fever + nausea + vomiting x2 No abdominal pain No diarrhea No skin rashes No fatigue + myalgias No headache     Past Medical History  Diagnosis Date  . Anxiety   . Depression   . Renal cell carcinoma     hx of    Past Surgical History  Procedure Date  . Cholecystectomy   . Tonsillectomy   . Nephrectomy     partial LT  . Fetal radio frequency ablation     Family History  Problem Relation Age of Onset  . Hypertension Mother   . Hypertension Father     History  Substance Use Topics  . Smoking status: Never Smoker   . Smokeless tobacco: Not on file  . Alcohol Use: No    OB History    Grav Para Term Preterm Abortions TAB SAB Ect Mult Living                  Review of Systems  Allergies  Review of patient's allergies indicates no known allergies.  Home Medications   Current Outpatient Rx  Name Route Sig Dispense Refill  . DESVENLAFAXINE SUCCINATE ER 50 MG PO TB24 Oral Take 50 mg by mouth daily.    Marland Kitchen HYDROCHLOROTHIAZIDE PO Oral Take by mouth.    Marland Kitchen POTASSIUM CHLORIDE CRYS ER  20 MEQ PO TBCR Oral Take 40 mEq by mouth 2 (two) times daily.    Marland Kitchen PROMETHAZINE HCL 12.5 MG PO TABS Oral Take 1 tablet (12.5 mg total) by mouth every 6 (six) hours as needed for nausea. 22 tablet 0    BP 125/79  Pulse 71  Temp(Src) 98.8 F (37.1 C) (Oral)  Resp 16  Ht 5\' 3"  (1.6 m)  Wt 175 lb (79.379 kg)  BMI 31.00 kg/m2  SpO2 100%  LMP 05/09/2011  Physical Exam  Nursing note and vitals reviewed. Constitutional: She is oriented to person, place, and time. She appears well-developed and well-nourished.  HENT:  Head: Normocephalic and atraumatic.  Eyes: No scleral icterus.  Neck: Neck supple.  Cardiovascular: Regular rhythm and normal heart sounds.   Pulmonary/Chest: Effort normal and breath sounds normal. No respiratory distress.  Abdominal: Soft. There is no tenderness. There is no rigidity, no rebound, no guarding and no CVA tenderness.  Neurological: She is alert and oriented to person, place, and time.  Skin: Skin is warm and dry.  Psychiatric: She has a normal mood and affect. Her speech is normal.    ED Course  Procedures (including critical care time)  Labs Reviewed - No data  to display No results found.   1. Nausea & vomiting       MDM   At this point, with her exposure in the hospital, this is most likely a stomach virus. I do not see any reason why this would be the normal influenza. I do not see any red flags concerning that would lead [place] to need to do further testing. However with the history of a left partial nephrectomy 6 years ago, if she is worsening, then a CMP may be appropriate at that time. I gave her prescription for Phenergan but she states that she probably will not use it. Instead I gave her a note for work which she was satisfied with and states that she would just like to go home and hydrate, rest, eat a bland diet for the next few days.  Lily Kocher, MD 05/09/11 1346

## 2011-05-09 NOTE — ED Notes (Signed)
Pt c/o body aches and vomit x 2 , today. Pt states that her sister tested positive for the flu last wk. She took IBF @ 12:00N.

## 2011-06-02 ENCOUNTER — Other Ambulatory Visit (HOSPITAL_COMMUNITY): Payer: Self-pay | Admitting: Family Medicine

## 2011-06-07 ENCOUNTER — Other Ambulatory Visit: Payer: Self-pay | Admitting: Family Medicine

## 2011-06-07 DIAGNOSIS — N644 Mastodynia: Secondary | ICD-10-CM

## 2011-06-07 DIAGNOSIS — N63 Unspecified lump in unspecified breast: Secondary | ICD-10-CM

## 2011-06-08 ENCOUNTER — Other Ambulatory Visit: Payer: Self-pay | Admitting: Family Medicine

## 2011-06-08 ENCOUNTER — Ambulatory Visit
Admission: RE | Admit: 2011-06-08 | Discharge: 2011-06-08 | Disposition: A | Discharge status: Home / Self Care | Payer: 59 | Source: Ambulatory Visit | Attending: Family Medicine | Admitting: Family Medicine

## 2011-06-08 DIAGNOSIS — N644 Mastodynia: Secondary | ICD-10-CM

## 2011-06-08 DIAGNOSIS — N63 Unspecified lump in unspecified breast: Secondary | ICD-10-CM

## 2012-01-24 ENCOUNTER — Telehealth: Payer: Self-pay | Admitting: Internal Medicine

## 2012-01-24 ENCOUNTER — Ambulatory Visit (INDEPENDENT_AMBULATORY_CARE_PROVIDER_SITE_OTHER): Payer: 59 | Admitting: Internal Medicine

## 2012-01-24 ENCOUNTER — Encounter: Payer: Self-pay | Admitting: Internal Medicine

## 2012-01-24 VITALS — BP 132/92 | HR 90 | Ht 63.0 in | Wt 180.0 lb

## 2012-01-24 DIAGNOSIS — J209 Acute bronchitis, unspecified: Secondary | ICD-10-CM

## 2012-01-24 MED ORDER — ALBUTEROL SULFATE (2.5 MG/3ML) 0.083% IN NEBU: 2.5000 mg | INHALATION_SOLUTION | Freq: Four times a day (QID) | RESPIRATORY_TRACT | Status: DC | PRN

## 2012-01-24 MED ORDER — NEBULIZER COMPRESSOR KIT: PACK | Status: AC

## 2012-01-24 MED ORDER — ESOMEPRAZOLE MAGNESIUM 40 MG PO CPDR: 80.0000 mg | DELAYED_RELEASE_CAPSULE | Freq: Every day | ORAL | Status: DC

## 2012-01-24 MED ORDER — CLARITHROMYCIN 500 MG PO TABS: ORAL_TABLET | ORAL | Status: DC

## 2012-01-24 NOTE — Progress Notes (Signed)
01/24/12- 51 yoF never smoker who comes to reestablish because of acute bronchitis. Last here 05/23/2006 when she was being followed for small lung nodules, asthmatic bronchitis, allergic rhinitis complicated by history of esophageal reflux and left partial nephrectomy for renal cell carcinoma recurrent in 2013/radio ablation.  She had worked as a Buyer, retail. Now reports an acute bronchitis syndrome with onset around 11/15/2011. Initial early fever, scant clear sputum, now green discharge from nose and much wheezing. Treated so far with Augmentin and Levaquin that prednisone course 40 mg daily x6 days. She was restarted on Advair with a rescue inhaler. Nebulizer treatment has helped. Chest x-ray 9/ 2013-at [facility] says was clear. She is aware of some reflux. TUMS and ranitidine.  Prior to Admission medications   Medication Sig Start Date End Date Taking? Authorizing Provider  albuterol (PROVENTIL HFA;VENTOLIN HFA) 108 (90 BASE) MCG/ACT inhaler Inhale 2 puffs into the lungs every 6 (six) hours as needed.   Yes Historical Provider, MD  desvenlafaxine (PRISTIQ) 50 MG 24 hr tablet Take 50 mg by mouth daily.   Yes Historical Provider, MD  Fluticasone-Salmeterol (ADVAIR) 250-50 MCG/DOSE AEPB Inhale 1 puff into the lungs every 12 (twelve) hours.   Yes Historical Provider, MD  HYDROCHLOROTHIAZIDE PO Take by mouth.   Yes Historical Provider, MD  potassium chloride SA (K-DUR,KLOR-CON) 20 MEQ tablet Take 40 mEq by mouth 2 (two) times daily.   Yes Historical Provider, MD  albuterol (PROVENTIL) (2.5 MG/3ML) 0.083% nebulizer solution Take 3 mLs (2.5 mg total) by nebulization every 6 (six) hours as needed for wheezing or shortness of breath. 01/24/12 01/23/13  Waymon Budge, MD  clarithromycin (BIAXIN) 500 MG tablet 1 twice daily after meals 01/24/12 01/23/13  Waymon Budge, MD  esomeprazole (NEXIUM) 40 MG capsule Take 2 capsules (80 mg total) by mouth daily before breakfast. 01/24/12   Waymon Budge, MD  Respiratory Therapy Supplies (NEBULIZER COMPRESSOR) KIT Use as directed 01/24/12 01/23/13  Waymon Budge, MD   Past Medical History  Diagnosis Date  . Anxiety   . Depression   . Renal cell carcinoma     hx of   Past Surgical History  Procedure Date  . Cholecystectomy   . Tonsillectomy   . Nephrectomy     partial LT  . Fetal radio frequency ablation    Family History  Problem Relation Age of Onset  . Hypertension Mother   . Hypertension Father    History   Social History  . Marital Status: Married    Spouse Name: N/A    Number of Children: N/A  . Years of Education: N/A   Occupational History  . Not on file.   Social History Main Topics  . Smoking status: Never Smoker   . Smokeless tobacco: Not on file  . Alcohol Use: No  . Drug Use: No  . Sexually Active:    Other Topics Concern  . Not on file   Social History Narrative  . No narrative on file   ROS-see HPI Constitutional:   No-   weight loss, night sweats, fevers, chills, fatigue, lassitude. HEENT:   No-  headaches, difficulty swallowing, tooth/dental problems, sore throat,       No-  sneezing, itching, ear ache, +nasal congestion, post nasal drip,  CV:  No-   chest pain, orthopnea, PND, swelling in lower extremities, anasarca, dizziness, palpitations Resp: No-   shortness of breath with exertion or at rest.              +  productive cough,  + non-productive cough,  No- coughing up of blood.              No-   change in color of mucus.  + wheezing.   Skin: No-   rash or lesions. GI:  + heartburn, indigestion, No-abdominal pain, nausea, vomiting, diarrhea,                 change in bowel habits, loss of appetite GU: No-   dysuria, change in color of urine, no urgency or frequency.  No- flank pain. MS:  No-   joint pain or swelling.  No- decreased range of motion.  No- back pain. Neuro-     nothing unusual Psych:  No- change in mood or affect. No depression or anxiety.  No memory  loss.  OBJ- Physical Exam General- Alert, Oriented, Affect-appropriate, Distress- none acute Skin- rash-none, lesions- none, excoriation- none Lymphadenopathy- none Head- atraumatic            Eyes- Gross vision intact, PERRLA, conjunctivae and secretions clear            Ears- Hearing, canals-normal            Nose- + turbinate edema, no-Septal dev, mucus, polyps, erosion, perforation             Throat- Mallampati II , mucosa clear , drainage- none, tonsils- atrophic Neck- flexible , trachea midline, no stridor , thyroid nl, carotid no bruit Chest - symmetrical excursion , unlabored           Heart/CV- RRR , no murmur , no gallop  , no rub, nl s1 s2                           - JVD- none , edema- none, stasis changes- none, varices- none           Lung- + coarse breath sounds, unlabored, wheeze- none, cough- none , dullness-none, rub- none           Chest wall-  Abd- tender-no, distended-no, bowel sounds-present, HSM- no Br/ Gen/ Rectal- Not done, not indicated Extrem- cyanosis- none, clubbing, none, atrophy- none, strength- nl Neuro- grossly intact to observation

## 2012-01-24 NOTE — Patient Instructions (Addendum)
Sample Dulera 200-  2 puffs then rinse twice daily    Use for now instead of Advair 250. When the sample is used, go back to Advair.  Script for nebulizer compressor and for albuterol neb solution  Script for Biaxin antibiotic

## 2012-01-24 NOTE — Telephone Encounter (Signed)
Pt can also be reached at [phone].  [name]

## 2012-01-24 NOTE — Telephone Encounter (Signed)
Set pt up w/ CY today @ 2:15 pm.  Pt verbalized understanding & stated nothing further needed at this time.   [name]

## 2012-02-01 DIAGNOSIS — J45901 Unspecified asthma with (acute) exacerbation: Secondary | ICD-10-CM | POA: Insufficient documentation

## 2012-02-01 NOTE — Assessment & Plan Note (Signed)
Onset sounds viral but subacute, persistent pattern suggests a sustaining process. May be a bacterial superinfection importance of reflux and aspiration is unclear. Key therapy is anti-inflammatory. Plan-sample Dulera 200, Biaxin, prescribed a nebulizer machine and albuterol for home use, Neti pot

## 2012-02-25 ENCOUNTER — Encounter: Payer: Self-pay | Admitting: Internal Medicine

## 2012-02-25 ENCOUNTER — Ambulatory Visit (INDEPENDENT_AMBULATORY_CARE_PROVIDER_SITE_OTHER): Payer: 59 | Admitting: Internal Medicine

## 2012-02-25 VITALS — BP 102/70 | HR 74 | Ht 63.0 in | Wt 185.4 lb

## 2012-02-25 DIAGNOSIS — J45909 Unspecified asthma, uncomplicated: Secondary | ICD-10-CM

## 2012-02-25 MED ORDER — MOMETASONE FURO-FORMOTEROL FUM 100-5 MCG/ACT IN AERO: 2.0000 | INHALATION_SPRAY | Freq: Two times a day (BID) | RESPIRATORY_TRACT | Status: DC

## 2012-02-25 NOTE — Progress Notes (Signed)
01/24/12- 51 yoF never smoker who comes to reestablish because of acute bronchitis. Last here 05/23/2006 when she was being followed for small lung nodules, asthmatic bronchitis, allergic rhinitis complicated by history of esophageal reflux and left partial nephrectomy for renal cell carcinoma recurrent in 2013/radio ablation.  She had worked as a Buyer, retail. Now reports an acute bronchitis syndrome with onset around 11/15/2011. Initial early fever, scant clear sputum, now green discharge from nose and much wheezing. Treated so far with Augmentin and Levaquin that prednisone course 40 mg daily x6 days. She was restarted on Advair with a rescue inhaler. Nebulizer treatment has helped. Chest x-ray 9/ 2013-at [facility] says was clear. She is aware of some reflux. TUMS and ranitidine.  02/25/12-51 yoF never smoker who comes to reestablish because of acute bronchitis. FOLLOWS FOR: patient reports she feels "much better"--states dulera has made a great difference--no other concerns at this time Feeling much better. Nebulizer machine has been a big help initially. She has gone back from [facility] 200 to her familiar Advair is much less need for rescue inhaler. Occasional frontal and maxillary sinus pressure treated with Sudafed  ROS-see HPI Constitutional:   No-   weight loss, night sweats, fevers, chills, fatigue, lassitude. HEENT:   No-  headaches, difficulty swallowing, tooth/dental problems, sore throat,       No-  sneezing, itching, ear ache, +nasal congestion, post nasal drip,  CV:  No-   chest pain, orthopnea, PND, swelling in lower extremities, anasarca, dizziness, palpitations Resp: No-   shortness of breath with exertion or at rest.              +  productive cough,  + non-productive cough,  No- coughing up of blood.              No-   change in color of mucus.  + wheezing.   Skin: No-   rash or lesions. GI:  + heartburn, indigestion, No-abdominal pain, nausea, vomiting,  GU:  . MS:  No-   joint pain or swelling.   Neuro-     nothing unusual Psych:  No- change in mood or affect. No depression or anxiety.  No memory loss.  OBJ- Physical Exam General- Alert, Oriented, Affect-appropriate, Distress- none acute. Overweight. Skin- rash-none, lesions- none, excoriation- none Lymphadenopathy- none Head- atraumatic            Eyes- Gross vision intact, PERRLA, conjunctivae and secretions clear            Ears- Hearing, canals-normal            Nose- + turbinate edema, no-Septal dev, mucus, polyps, erosion, perforation             Throat- Mallampati II , mucosa clear , drainage- none, tonsils- atrophic Neck- flexible , trachea midline, no stridor , thyroid nl, carotid no bruit Chest - symmetrical excursion , unlabored           Heart/CV- RRR , no murmur , no gallop  , no rub, nl s1 s2                           - JVD- none , edema- none, stasis changes- none, varices- none           Lung- clear, unlabored, wheeze- none, cough- none , dullness-none, rub- none           Chest wall-  Abd-  Br/ Gen/ Rectal- Not done, not indicated  Extrem- cyanosis- none, clubbing, none, atrophy- none, strength- nl Neuro- grossly intact to observation

## 2012-02-25 NOTE — Patient Instructions (Addendum)
Sample and script for Dulera 100       2 puffs then rinse mouth, twice daily   Try this as your maintenance inhaler instead of Advair  Please call as needed

## 2012-03-11 NOTE — Assessment & Plan Note (Signed)
Albert controlled, managing his chronic asthma with bronchitis. We discussed medication choices and she wants to retry [facility] as she compares this with Advair.

## 2012-04-17 ENCOUNTER — Telehealth: Payer: Self-pay | Admitting: Internal Medicine

## 2012-04-17 MED ORDER — PREDNISONE 20 MG PO TABS: 20.0000 mg | ORAL_TABLET | Freq: Every day | ORAL | Status: DC

## 2012-04-17 MED ORDER — AZITHROMYCIN 250 MG PO TABS: ORAL_TABLET | ORAL | Status: DC

## 2012-04-17 NOTE — Telephone Encounter (Signed)
Per CY-offer Zpak #1 take as directed no refills and Prednisone 20 mg #3 take 1 po qd x 3 days no refills.

## 2012-04-17 NOTE — Telephone Encounter (Signed)
I spoke with pt. She is aware of CDY recs. rx has been sent to the pharmacy. Nothing further was needed

## 2012-04-17 NOTE — Telephone Encounter (Signed)
Spoke with pt states she feels like she is getting bronchitis again. 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 Wants to know since she  Has issues with her esophagus should she maybe come in for a Steroid injection,an abx. No Known Allergies Dr Maple Hudson Please advise Thank you

## 2012-04-21 ENCOUNTER — Telehealth: Payer: Self-pay | Admitting: Internal Medicine

## 2012-04-21 MED ORDER — MOMETASONE FURO-FORMOTEROL FUM 100-5 MCG/ACT IN AERO: 2.0000 | INHALATION_SPRAY | Freq: Two times a day (BID) | RESPIRATORY_TRACT | Status: DC

## 2012-04-21 NOTE — Telephone Encounter (Signed)
RX has been sent to the pharmacy. Nothing further was needed 

## 2012-08-07 ENCOUNTER — Encounter: Payer: Self-pay | Admitting: Internal Medicine

## 2012-08-07 ENCOUNTER — Ambulatory Visit (INDEPENDENT_AMBULATORY_CARE_PROVIDER_SITE_OTHER): Payer: 59 | Admitting: Internal Medicine

## 2012-08-07 VITALS — BP 116/72 | HR 81 | Ht 63.0 in | Wt 194.8 lb

## 2012-08-07 DIAGNOSIS — J45909 Unspecified asthma, uncomplicated: Secondary | ICD-10-CM

## 2012-08-07 MED ORDER — ALBUTEROL SULFATE HFA 108 (90 BASE) MCG/ACT IN AERS: 2.0000 | INHALATION_SPRAY | Freq: Four times a day (QID) | RESPIRATORY_TRACT | Status: DC | PRN

## 2012-08-07 NOTE — Patient Instructions (Addendum)
Script sent to refill rescue inhaler  Try reducing Dulera 100 to 1 puff then rinse, twice daily. If you start feeling the asthma coming back up, then slide back up to 2 puffs, twice daily.

## 2012-08-07 NOTE — Progress Notes (Signed)
01/24/12- 51 yoF never smoker who comes to reestablish because of acute bronchitis. Last here 05/23/2006 when she was being followed for small lung nodules, asthmatic bronchitis, allergic rhinitis complicated by history of esophageal reflux and left partial nephrectomy for renal cell carcinoma recurrent in 2013/radio ablation.  She had worked as a Buyer, retail. Now reports an acute bronchitis syndrome with onset around 11/15/2011. Initial early fever, scant clear sputum, now green discharge from nose and much wheezing. Treated so far with Augmentin and Levaquin that prednisone course 40 mg daily x6 days. She was restarted on Advair with a rescue inhaler. Nebulizer treatment has helped. Chest x-ray 9/ 2013-at [facility] says was clear. She is aware of some reflux. TUMS and ranitidine.  02/25/12-51 yoF never smoker who comes to reestablish because of acute bronchitis. FOLLOWS FOR: patient reports she feels "much better"--states dulera has made a great difference--no other concerns at this time Feeling much better. Nebulizer machine has been a big help initially. She has gone back from [facility] to her familiar Advair is much less need for rescue inhaler. Occasional frontal and maxillary sinus pressure treated with Sudafed  08/07/12- 51 yoF never smoker who comes to reestablish because of acute bronchitis. FOLLOWS FOR: patient states she is doing much better than last visit; no flare ups yet. Using rescue inhaler once every 2 or 3 days. Little problems this spring with pollen. Continues Dulera 2 puffs twice daily. Has not needed nebulizer since last winter.  ROS-see HPI Constitutional:   No-   weight loss, night sweats, fevers, chills, fatigue, lassitude. HEENT:   No-  headaches, difficulty swallowing, tooth/dental problems, sore throat,       No-  sneezing, itching, ear ache, +nasal congestion, post nasal drip,  CV:  No-  chest pain, orthopnea, PND, swelling in lower extremities,  anasarca, dizziness, palpitations Resp: +shortness of breath with exertion or at rest.              No-productive cough,  + non-productive cough,  No- coughing up of blood.              No-   change in color of mucus.  + wheezing.   Skin: No-   rash or lesions. GI:  + heartburn, indigestion, No-abdominal pain, nausea, vomiting,  GU: . MS:  No-   joint pain or swelling.   Neuro-     nothing unusual Psych:  No- change in mood or affect. No depression or anxiety.  No memory loss.  OBJ- Physical Exam General- Alert, Oriented, Affect-appropriate, Distress- none acute. Overweight. Skin- rash-none, lesions- none, excoriation- none Lymphadenopathy- none Head- atraumatic            Eyes- Gross vision intact, PERRLA, conjunctivae and secretions clear            Ears- Hearing, canals-normal            Nose- no- turbinate edema, no-Septal dev, mucus, polyps, erosion, perforation             Throat- Mallampati II , mucosa clear , drainage- none, tonsils- atrophic Neck- flexible , trachea midline, no stridor , thyroid nl, carotid no bruit Chest - symmetrical excursion , unlabored           Heart/CV- RRR , no murmur , no gallop  , no rub, nl s1 s2                           - JVD-  none , edema- none, stasis changes- none, varices- none           Lung- clear, unlabored, wheeze- none, cough- none , dullness-none, rub- none           Chest wall-  Abd-  Br/ Gen/ Rectal- Not done, not indicated Extrem- cyanosis- none, clubbing, none, atrophy- none, strength- nl Neuro- grossly intact to observation

## 2012-08-16 NOTE — Assessment & Plan Note (Signed)
Good control. We discussed her medications. Plan-refill rescue inhaler. She can try reducing Dulera 21 puff twice daily, to reduce cost.

## 2012-09-12 ENCOUNTER — Other Ambulatory Visit: Payer: Self-pay | Admitting: Internal Medicine

## 2012-09-20 ENCOUNTER — Telehealth: Payer: Self-pay | Admitting: Internal Medicine

## 2012-09-20 MED ORDER — AMOXICILLIN-POT CLAVULANATE 875-125 MG PO TABS: 1.0000 | ORAL_TABLET | Freq: Two times a day (BID) | ORAL | Status: DC

## 2012-09-20 NOTE — Telephone Encounter (Signed)
Pt c/o nasal congestion, sore throat, sinus pressure HA, wheezing (esp in pm), low grade fever, sweating @pm , cough in the beginning of this week. Started on Sunday 09/17/12 Pt increased Dulera back up to 2 puff twice daily from 1 puff bid  No Known Allergies [facility] [address]  Pt requesting recs per CY. Please advise Dr Maple Hudson. Thanks.

## 2012-09-20 NOTE — Telephone Encounter (Signed)
Pt aware of recs. rx called in 

## 2012-09-20 NOTE — Telephone Encounter (Signed)
Suggest augmentin 875 mg # 14, 1 twice daily for possible sinus infection and bronchitis

## 2012-09-21 DIAGNOSIS — Z85528 Personal history of other malignant neoplasm of kidney: Secondary | ICD-10-CM | POA: Insufficient documentation

## 2012-10-04 ENCOUNTER — Telehealth: Payer: Self-pay | Admitting: Internal Medicine

## 2012-10-04 MED ORDER — ALBUTEROL SULFATE HFA 108 (90 BASE) MCG/ACT IN AERS: 2.0000 | INHALATION_SPRAY | Freq: Four times a day (QID) | RESPIRATORY_TRACT | Status: DC | PRN

## 2012-10-04 NOTE — Telephone Encounter (Signed)
Proair HFA is covered under patients insurance and does not require PA to be done. I have sent new RX to pharmacy.

## 2012-10-19 ENCOUNTER — Telehealth: Payer: Self-pay | Admitting: Internal Medicine

## 2012-10-19 NOTE — Telephone Encounter (Signed)
Per the pt, her insurance will not pay for Ventolin but will cover ProAir. She has used this in the past and it did not work as well as Ventolin.  I advised her to contact her pharmacy, they should fax [place] so we may initiate he prior authorization. She agreed and verbalized understanding.

## 2012-10-23 MED ORDER — ALBUTEROL SULFATE HFA 108 (90 BASE) MCG/ACT IN AERS: 2.0000 | INHALATION_SPRAY | Freq: Four times a day (QID) | RESPIRATORY_TRACT | Status: DC | PRN

## 2012-10-23 NOTE — Addendum Note (Signed)
Addended by: Orma Flaming D on: 10/23/2012 11:29 AM   Modules accepted: Orders

## 2012-10-23 NOTE — Telephone Encounter (Signed)
Per CY send in Pro air with prn refills (covered HFA)- no need to do PA for Ventolin Rx has been sent

## 2012-12-05 ENCOUNTER — Telehealth: Payer: Self-pay | Admitting: Internal Medicine

## 2012-12-05 MED ORDER — CLARITHROMYCIN 500 MG PO TABS: 500.0000 mg | ORAL_TABLET | Freq: Two times a day (BID) | ORAL | Status: DC

## 2012-12-05 NOTE — Telephone Encounter (Signed)
I spoke with pt. She c/o sore throat, fever of 99-101, nasal congestion, lots of PND, facial pressure, bilateral ear pain, blows out green phlem from nose x 1 week. She has been taking allegra D and OTC decongestants. Please advise Dr. Maple Hudson thanks  Last OV 08/07/12 Pending 02/22/13 No Known Allergies

## 2012-12-05 NOTE — Telephone Encounter (Signed)
Per CY-start Biaxin 500 mg #14 take 1 po BID no refills.

## 2012-12-05 NOTE — Telephone Encounter (Signed)
Pt advised and rx sent. Stephine Langbehn, CMA  

## 2013-01-15 ENCOUNTER — Other Ambulatory Visit: Payer: Self-pay | Admitting: Internal Medicine

## 2013-02-12 ENCOUNTER — Encounter: Payer: Self-pay | Admitting: Internal Medicine

## 2013-02-12 ENCOUNTER — Ambulatory Visit (INDEPENDENT_AMBULATORY_CARE_PROVIDER_SITE_OTHER): Payer: 59 | Admitting: Internal Medicine

## 2013-02-12 ENCOUNTER — Encounter (INDEPENDENT_AMBULATORY_CARE_PROVIDER_SITE_OTHER): Payer: Self-pay

## 2013-02-12 VITALS — BP 120/78 | HR 80 | Ht 63.0 in | Wt 184.8 lb

## 2013-02-12 DIAGNOSIS — J45909 Unspecified asthma, uncomplicated: Secondary | ICD-10-CM

## 2013-02-12 DIAGNOSIS — J4521 Mild intermittent asthma with (acute) exacerbation: Secondary | ICD-10-CM

## 2013-02-12 DIAGNOSIS — J45901 Unspecified asthma with (acute) exacerbation: Secondary | ICD-10-CM

## 2013-02-12 MED ORDER — BENZONATATE 200 MG PO CAPS: 200.0000 mg | ORAL_CAPSULE | Freq: Three times a day (TID) | ORAL | Status: DC | PRN

## 2013-02-12 MED ORDER — AZITHROMYCIN 250 MG PO TABS: ORAL_TABLET | ORAL | Status: DC

## 2013-02-12 MED ORDER — METHYLPREDNISOLONE ACETATE 80 MG/ML IJ SUSP
80.0000 mg | Freq: Once | INTRAMUSCULAR | Status: AC
  Administered 2013-02-12: 80 mg via INTRAMUSCULAR

## 2013-02-12 NOTE — Patient Instructions (Signed)
Depo 80  Script for [place] for [employer] can try using your Neti pot saline rinse and you can try Delsym

## 2013-02-12 NOTE — Progress Notes (Signed)
01/24/12- 51 yoF never smoker who comes to reestablish because of acute bronchitis. Last here 05/23/2006 when she was being followed for small lung nodules, asthmatic bronchitis, allergic rhinitis complicated by history of esophageal reflux and left partial nephrectomy for renal cell carcinoma recurrent in 2013/radio ablation.  She had worked as a Buyer, retail. Now reports an acute bronchitis syndrome with onset around 11/15/2011. Initial early fever, scant clear sputum, now green discharge from nose and much wheezing. Treated so far with Augmentin and Levaquin that prednisone course 40 mg daily x6 days. She was restarted on Advair with a rescue inhaler. Nebulizer treatment has helped. Chest x-ray 9/ 2013-at [facility] says was clear. She is aware of some reflux. TUMS and ranitidine.  02/25/12-51 yoF never smoker who comes to reestablish because of acute bronchitis. FOLLOWS FOR: patient reports she feels "much better"--states dulera has made a great difference--no other concerns at this time Feeling much better. Nebulizer machine has been a big help initially. She has gone back from [facility] to her familiar Advair is much less need for rescue inhaler. Occasional frontal and maxillary sinus pressure treated with Sudafed  08/07/12- 51 yoF never smoker who comes to reestablish because of acute bronchitis. FOLLOWS FOR: patient states she is doing much better than last visit; no flare ups yet. Using rescue inhaler once every 2 or 3 days. Little problems this spring with pollen. Continues Dulera 2 puffs twice daily. Has not needed nebulizer since last winter.  02/12/13- 52 yoF never smoker followed for recurrent acute bronchitis with asthma FOLLOWS FOR: Sinus and chest congestion w/ cough w/ yellow mucus. Onset in October of head congestion, post nasal drip and cough-thick green. Not much wheeze. Initial headache is gone. Ears were stopped up and popped-improved. Got Augmentin June 18, Biaxin  September 2. Continues Dulera 100 but did not find rescue inhaler helpful.  ROS-see HPI Constitutional:   No-   weight loss, night sweats, fevers, chills, fatigue, lassitude. HEENT:   +headaches, difficulty swallowing, tooth/dental problems, sore throat,       No-  sneezing, itching, ear ache, +nasal congestion, +post nasal drip,  CV:  No-  chest pain, orthopnea, PND, swelling in lower extremities, anasarca, dizziness, palpitations Resp: +shortness of breath with exertion or at rest.              +productive cough,  + non-productive cough,  No- coughing up of blood.              +change in color of mucus. No- wheezing.   Skin: No-   rash or lesions. GI:  + heartburn, indigestion, No-abdominal pain, nausea, vomiting,  GU: . MS:  No-   joint pain or swelling.   Neuro-     nothing unusual Psych:  No- change in mood or affect. No depression or anxiety.  No memory loss.  OBJ- Physical Exam General- Alert, Oriented, Affect-appropriate, Distress- none acute. Overweight. Skin- rash-none, lesions- none, excoriation- none Lymphadenopathy- none Head- atraumatic            Eyes- Gross vision intact, PERRLA, conjunctivae and secretions clear            Ears- Hearing, canals-normal            Nose- no- turbinate edema, no-Septal dev, mucus, polyps, erosion, perforation             Throat- Mallampati II , mucosa-red , drainage- none, tonsils- atrophic, + hoarse Neck- flexible , trachea midline, no stridor , thyroid nl, carotid  no bruit Chest - symmetrical excursion , unlabored           Heart/CV- RRR , no murmur , no gallop  , no rub, nl s1 s2                           - JVD- none , edema- none, stasis changes- none, varices- none           Lung- clear, unlabored, wheeze- none, cough+ dry , dullness-none, rub- none           Chest wall-  Abd-  Br/ Gen/ Rectal- Not done, not indicated Extrem- cyanosis- none, clubbing, none, atrophy- none, strength- nl Neuro- grossly intact to observation

## 2013-02-13 ENCOUNTER — Telehealth: Payer: Self-pay | Admitting: Internal Medicine

## 2013-02-15 MED ORDER — ALBUTEROL SULFATE HFA 108 (90 BASE) MCG/ACT IN AERS: 2.0000 | INHALATION_SPRAY | Freq: Four times a day (QID) | RESPIRATORY_TRACT | Status: DC | PRN

## 2013-02-15 NOTE — Telephone Encounter (Signed)
After speaking with Lindsay-pt [facility]. I have the paper and will take care of this.

## 2013-02-15 NOTE — Telephone Encounter (Signed)
Called to int itate PA.  Called [phone] ID: [id number] I was advised pt plan does not cover ventolin. It covers proair. I advised will send this in for pt.  I called pt NA and no VM WCB

## 2013-02-19 NOTE — Telephone Encounter (Signed)
Pt is aware that we have done the prior auth and her insurance will not cover Ventolin. ProAir has been sent, pt is aware.

## 2013-02-19 NOTE — Telephone Encounter (Signed)
Spoke with pt spouse who advised pt is at work and to try cell number. I [id number] on cell. Carron Curie, CMA

## 2013-02-25 NOTE — Assessment & Plan Note (Addendum)
Acute exacerbation, mainly an upper respiratory infection with tracheobronchitis Plan-Z-Pak, Depo-Medrol, Delsym, Tessalon Perles

## 2013-03-14 ENCOUNTER — Telehealth: Payer: Self-pay | Admitting: Internal Medicine

## 2013-03-14 MED ORDER — PREDNISONE 10 MG PO TABS: ORAL_TABLET | ORAL | Status: DC

## 2013-03-14 NOTE — Telephone Encounter (Signed)
Spoke wit CY-sounds like asthma flare up and given her Prednisone 10 mg #20 take 4 x 2 days, 3 x 2 days, 2 x 2 days, 1 x 2 days, then stop no refills. Pt is aware and will start tonight; will call us if no better for OV.

## 2013-03-15 ENCOUNTER — Telehealth: Payer: Self-pay | Admitting: Internal Medicine

## 2013-03-15 MED ORDER — HYDROCODONE-HOMATROPINE 5-1.5 MG/5ML PO SYRP: 5.0000 mL | ORAL_SOLUTION | Freq: Four times a day (QID) | ORAL | Status: DC | PRN

## 2013-03-15 NOTE — Telephone Encounter (Signed)
Rx has been printed to be signed. Pt is aware that this will be ready before lunch so that she may pick it up.

## 2013-03-15 NOTE — Telephone Encounter (Addendum)
Spoke with pt. Called yesterday and was given Prednisone for chest tightness, cough and SOB. Has been using Tessalon and Delsym without any relief. Feels like she needs something called in for cough. She is currently out of work for the rest of the week and weekend. Did state that some times Codeine can make her itch.  No Known Allergies  Current Outpatient Prescriptions on File Prior to Visit  Medication Sig Dispense Refill  . albuterol (PROAIR HFA) 108 (90 BASE) MCG/ACT inhaler Inhale 2 puffs into the lungs every 6 (six) hours as needed for wheezing.  1 Inhaler  prn  . albuterol (PROAIR HFA) 108 (90 BASE) MCG/ACT inhaler Inhale 2 puffs into the lungs every 6 (six) hours as needed for wheezing or shortness of breath.  1 Inhaler  3  . albuterol (PROVENTIL) (2.5 MG/3ML) 0.083% nebulizer solution Take 3 mLs (2.5 mg total) by nebulization every 6 (six) hours as needed for wheezing or shortness of breath.  75 mL  12  . azithromycin (ZITHROMAX) 250 MG tablet 2 today then one daily  6 each  0  . benzonatate (TESSALON) 200 MG capsule Take 1 capsule (200 mg total) by mouth 3 (three) times daily as needed for cough.  30 capsule  1  . DULERA 100-5 MCG/ACT AERO INHALE 2 PUFFS INTO THE LUNGS 2 (TWO) TIMES DAILY. RINSE MOUTH  1 Inhaler  4  . hydrochlorothiazide (HYDRODIURIL) 25 MG tablet Take 25 mg by mouth daily.      Marland Kitchen NEXIUM 40 MG capsule TAKE 2 CAPSULES (80 MG TOTAL) BY MOUTH DAILY BEFORE BREAKFAST.  60 capsule  0  . potassium chloride SA (K-DUR,KLOR-CON) 20 MEQ tablet Take 40 mEq by mouth 2 (two) times daily.      . predniSONE (DELTASONE) 10 MG tablet Take 4x2 days, 3x2 days, 2x2 days, 1x2 days, then stop  20 tablet  0  . Vortioxetine HBr (BRINTELLIX) 20 MG TABS Take 1 tablet by mouth daily.       No current facility-administered medications on file prior to visit.     CY - please advise. Thanks.

## 2013-03-15 NOTE — Telephone Encounter (Signed)
Offer hydromet 200 ml, 1 teaspoon every 6 hours if needed for cough

## 2013-04-02 ENCOUNTER — Telehealth: Payer: Self-pay | Admitting: Internal Medicine

## 2013-04-02 NOTE — Telephone Encounter (Signed)
I called spoke with pt. She reports she finished a round of prednisone about 2 weeks ago. She c/o nasal congestion, facial pressure, wheezing, chest congestion, cough w/ yellow colored phlem. Denies any f/c/s/n/v. She has Allegra D, proair, hycodan cough syrup. She is scared to use all these medications in addition to her brintellix depression medications. Please advise Dr. Maple Hudson thanks  No Known Allergies   Current Outpatient Prescriptions on File Prior to Visit  Medication Sig Dispense Refill  . albuterol (PROAIR HFA) 108 (90 BASE) MCG/ACT inhaler Inhale 2 puffs into the lungs every 6 (six) hours as needed for wheezing.  1 Inhaler  prn  . albuterol (PROAIR HFA) 108 (90 BASE) MCG/ACT inhaler Inhale 2 puffs into the lungs every 6 (six) hours as needed for wheezing or shortness of breath.  1 Inhaler  3  . albuterol (PROVENTIL) (2.5 MG/3ML) 0.083% nebulizer solution Take 3 mLs (2.5 mg total) by nebulization every 6 (six) hours as needed for wheezing or shortness of breath.  75 mL  12  . azithromycin (ZITHROMAX) 250 MG tablet 2 today then one daily  6 each  0  . benzonatate (TESSALON) 200 MG capsule Take 1 capsule (200 mg total) by mouth 3 (three) times daily as needed for cough.  30 capsule  1  . DULERA 100-5 MCG/ACT AERO INHALE 2 PUFFS INTO THE LUNGS 2 (TWO) TIMES DAILY. RINSE MOUTH  1 Inhaler  4  . hydrochlorothiazide (HYDRODIURIL) 25 MG tablet Take 25 mg by mouth daily.      Marland Kitchen HYDROcodone-homatropine (HYCODAN) 5-1.5 MG/5ML syrup Take 5 mLs by mouth every 6 (six) hours as needed for cough.  200 mL  0  . NEXIUM 40 MG capsule TAKE 2 CAPSULES (80 MG TOTAL) BY MOUTH DAILY BEFORE BREAKFAST.  60 capsule  0  . potassium chloride SA (K-DUR,KLOR-CON) 20 MEQ tablet Take 40 mEq by mouth 2 (two) times daily.      . predniSONE (DELTASONE) 10 MG tablet Take 4x2 days, 3x2 days, 2x2 days, 1x2 days, then stop  20 tablet  0  . Vortioxetine HBr (BRINTELLIX) 20 MG TABS Take 1 tablet by mouth daily.       No current  facility-administered medications on file prior to visit.

## 2013-04-02 NOTE — Telephone Encounter (Signed)
She can use the listed meds as needed to get through this.

## 2013-04-02 NOTE — Telephone Encounter (Signed)
I called and spoke with pt. Aware of recs. Nothing further needed 

## 2013-04-09 ENCOUNTER — Ambulatory Visit: Payer: 59 | Admitting: Internal Medicine

## 2013-05-08 ENCOUNTER — Encounter: Payer: Self-pay | Admitting: Internal Medicine

## 2013-05-08 ENCOUNTER — Ambulatory Visit (INDEPENDENT_AMBULATORY_CARE_PROVIDER_SITE_OTHER): Payer: 59 | Admitting: Internal Medicine

## 2013-05-08 ENCOUNTER — Ambulatory Visit (INDEPENDENT_AMBULATORY_CARE_PROVIDER_SITE_OTHER)
Admission: RE | Admit: 2013-05-08 | Discharge: 2013-05-08 | Disposition: A | Discharge status: Home / Self Care | Payer: 59 | Source: Ambulatory Visit | Attending: Internal Medicine | Admitting: Internal Medicine

## 2013-05-08 VITALS — BP 118/76 | HR 91 | Ht 63.0 in | Wt 184.2 lb

## 2013-05-08 DIAGNOSIS — R918 Other nonspecific abnormal finding of lung field: Secondary | ICD-10-CM

## 2013-05-08 DIAGNOSIS — J45909 Unspecified asthma, uncomplicated: Secondary | ICD-10-CM

## 2013-05-08 DIAGNOSIS — R911 Solitary pulmonary nodule: Secondary | ICD-10-CM

## 2013-05-08 NOTE — Patient Instructions (Signed)
Order- CXR   Dx lung nodules  Please try to get [place] the disk of your CT abdomen from [facility]

## 2013-05-08 NOTE — Progress Notes (Signed)
01/24/12- 36 yoF never smoker who comes to reestablish because of acute bronchitis. Last here 05/23/2006 when she was being followed for small lung nodules, asthmatic bronchitis, allergic rhinitis complicated by history of esophageal reflux and left partial nephrectomy for renal cell carcinoma recurrent in 2013/radio ablation.  She had worked as a Statistician. Now reports an acute bronchitis syndrome with onset around 11/15/2011. Initial early fever, scant clear sputum, now green discharge from nose and much wheezing. Treated so far with Augmentin and Levaquin that prednisone course 40 mg daily x6 days. She was restarted on Advair with a rescue inhaler. Nebulizer treatment has helped. Chest x-ray 9/ 2013-at [facility] says was clear. She is aware of some reflux. TUMS and ranitidine.  02/25/12-51 yoF never smoker who comes to reestablish because of acute bronchitis. FOLLOWS FOR: patient reports she feels "much better"--states dulera has made a great difference--no other concerns at this time Feeling much better. Nebulizer machine has been a big help initially. She has gone back from [facility] to her familiar Advair is much less need for rescue inhaler. Occasional frontal and maxillary sinus pressure treated with Sudafed  08/07/12- 73 yoF never smoker who comes to reestablish because of acute bronchitis. FOLLOWS FOR: patient states she is doing much better than last visit; no flare ups yet. Using rescue inhaler once every 2 or 3 days. Little problems this spring with pollen. Continues Dulera 2 puffs twice daily. Has not needed nebulizer since last winter.  02/12/13- 64 yoF never smoker followed for recurrent acute bronchitis with asthma FOLLOWS FOR: Sinus and chest congestion w/ cough w/ yellow mucus. Onset in October of head congestion, post nasal drip and cough-thick green. Not much wheeze. Initial headache is gone. Ears were stopped up and popped-improved. Got Augmentin June 18, Biaxin  September 2. Continues Dulera 100 but did not find rescue inhaler helpful.  05/07/13- 52 yoF never smoker followed for recurrent acute bronchitis with asthma, Lung nodules FOLLOWS FOR:Pt states she has been doing well; Pt needs PA form filled to help get Ventolin HFA as Proair HFA does not help. [employer] will not cover with out the letter/PA from [place]. Acute bronchitis late December  Being Rx'd for colitis. CT abd in [place] [facility]- Saw lung nodules. She had had nodules in past which had seemed to clear. She is to bring [place] that disk.  Wants to use Ventolin HFA, saying Proair doesn't get in as well. C/O frontal "sinus" headache.  ROS-see HPI Constitutional:   No-   weight loss, night sweats, fevers, chills, fatigue, lassitude. HEENT:   +headaches, difficulty swallowing, tooth/dental problems, sore throat,       No-  sneezing, itching, ear ache, +nasal congestion, +post nasal drip,  CV:  No-  chest pain, orthopnea, PND, swelling in lower extremities, anasarca, dizziness, palpitations Resp: +shortness of breath with exertion or at rest.              +productive cough,  + non-productive cough,  No- coughing up of blood.              +change in color of mucus. No- wheezing.   Skin: No-   rash or lesions. GI:  + heartburn, indigestion, No-abdominal pain, nausea, vomiting,  GU: . MS:  No-   joint pain or swelling.   Neuro-     nothing unusual Psych:  No- change in mood or affect. No depression or anxiety.  No memory loss.  OBJ- Physical Exam General- Alert, Oriented, Affect-appropriate, Distress- none  acute. Overweight. Skin- rash-none, lesions- none, excoriation- none Lymphadenopathy- none Head- atraumatic            Eyes- Gross vision intact, PERRLA, conjunctivae and secretions clear            Ears- Hearing, canals-normal            Nose- no- turbinate edema, no-Septal dev, mucus, polyps, erosion, perforation             Throat- Mallampati II , mucosa-red , drainage- none, tonsils-  atrophic, + hoarse Neck- flexible , trachea midline, no stridor , thyroid nl, carotid no bruit Chest - symmetrical excursion , unlabored           Heart/CV- RRR , no murmur , no gallop  , no rub, nl s1 s2                           - JVD- none , edema- none, stasis changes- none, varices- none           Lung- clear, unlabored, wheeze- none, cough- none , dullness-none, rub- none           Chest wall-  Abd-  Br/ Gen/ Rectal- Not done, not indicated Extrem- cyanosis- none, clubbing, none, atrophy- none, strength- nl Neuro- grossly intact to observation

## 2013-06-01 DIAGNOSIS — R918 Other nonspecific abnormal finding of lung field: Secondary | ICD-10-CM | POA: Insufficient documentation

## 2013-06-01 NOTE — Assessment & Plan Note (Signed)
Prefers Ventolin over [place]- Rx Ventolin HFA, order CXR

## 2013-06-01 NOTE — Assessment & Plan Note (Addendum)
We need to see the disk from [facility] of CT abdomen if possible. These may be old granulomas, although previous nodules had not been seen again on f/u/ Plan- order CXR, patient to bring disk of abd CT from [facility]

## 2013-06-07 ENCOUNTER — Other Ambulatory Visit: Payer: Self-pay | Admitting: Orthopedic Surgery

## 2013-06-07 DIAGNOSIS — M25559 Pain in unspecified hip: Secondary | ICD-10-CM

## 2013-06-15 ENCOUNTER — Other Ambulatory Visit: Payer: 59

## 2013-07-18 ENCOUNTER — Encounter: Payer: Self-pay | Admitting: Internal Medicine

## 2013-07-25 ENCOUNTER — Telehealth: Payer: Self-pay | Admitting: Internal Medicine

## 2013-07-25 MED ORDER — AMOXICILLIN-POT CLAVULANATE 875-125 MG PO TABS: 1.0000 | ORAL_TABLET | Freq: Two times a day (BID) | ORAL | Status: DC

## 2013-07-25 NOTE — Telephone Encounter (Signed)
Offer augmentin 875, # 14, one twice daily

## 2013-07-25 NOTE — Telephone Encounter (Signed)
Called spoke w/ pt. Aware of CDY recs.  RX sent in. Nothing further needed

## 2013-07-25 NOTE — Telephone Encounter (Signed)
Called spoke with pt. She is blowing out green phlem from nose, PND, nasal congestion, facial pressure, HA x last thursday. Taking mucinex D and drinking lots of water. Wants an ABX. Please advise Dr. Annamaria Boots thanks ---[facility] No Known Allergies   Current Outpatient Prescriptions on File Prior to Visit  Medication Sig Dispense Refill  . albuterol (PROVENTIL) (2.5 MG/3ML) 0.083% nebulizer solution Take 3 mLs (2.5 mg total) by nebulization every 6 (six) hours as needed for wheezing or shortness of breath.  75 mL  12  . albuterol (VENTOLIN HFA) 108 (90 BASE) MCG/ACT inhaler Inhale 2 puffs into the lungs every 6 (six) hours as needed for wheezing or shortness of breath.      . benzonatate (TESSALON) 200 MG capsule Take 1 capsule (200 mg total) by mouth 3 (three) times daily as needed for cough.  30 capsule  1  . DULERA 100-5 MCG/ACT AERO INHALE 2 PUFFS INTO THE LUNGS 2 (TWO) TIMES DAILY. RINSE MOUTH  1 Inhaler  4  . hydrochlorothiazide (HYDRODIURIL) 25 MG tablet Take 25 mg by mouth daily.      Marland Kitchen NEXIUM 40 MG capsule TAKE 2 CAPSULES (80 MG TOTAL) BY MOUTH DAILY BEFORE BREAKFAST.  60 capsule  0  . potassium chloride SA (K-DUR,KLOR-CON) 20 MEQ tablet Take 40 mEq by mouth 2 (two) times daily.      . Vortioxetine HBr (BRINTELLIX) 20 MG TABS Take 1 tablet by mouth daily.       No current facility-administered medications on file prior to visit.

## 2013-07-30 ENCOUNTER — Encounter: Payer: Self-pay | Admitting: Internal Medicine

## 2013-07-30 ENCOUNTER — Encounter (INDEPENDENT_AMBULATORY_CARE_PROVIDER_SITE_OTHER): Payer: Self-pay

## 2013-07-30 ENCOUNTER — Telehealth: Payer: Self-pay | Admitting: Internal Medicine

## 2013-07-30 ENCOUNTER — Ambulatory Visit (INDEPENDENT_AMBULATORY_CARE_PROVIDER_SITE_OTHER): Payer: 59 | Admitting: Internal Medicine

## 2013-07-30 VITALS — BP 140/88 | HR 90 | Ht 63.0 in | Wt 184.0 lb

## 2013-07-30 DIAGNOSIS — J45909 Unspecified asthma, uncomplicated: Secondary | ICD-10-CM

## 2013-07-30 DIAGNOSIS — J069 Acute upper respiratory infection, unspecified: Secondary | ICD-10-CM

## 2013-07-30 MED ORDER — SULFAMETHOXAZOLE-TMP DS 800-160 MG PO TABS: ORAL_TABLET | ORAL | Status: DC

## 2013-07-30 MED ORDER — HYDROCODONE-HOMATROPINE 5-1.5 MG/5ML PO SYRP: 5.0000 mL | ORAL_SOLUTION | Freq: Four times a day (QID) | ORAL | Status: DC | PRN

## 2013-07-30 MED ORDER — METHYLPREDNISOLONE ACETATE 80 MG/ML IJ SUSP
80.0000 mg | Freq: Once | INTRAMUSCULAR | Status: AC
  Administered 2013-07-30: 80 mg via INTRAMUSCULAR

## 2013-07-30 NOTE — Telephone Encounter (Signed)
See if she can come in for just a quick visit for depo shot. Needs to be checked in as usual, but can double her onto schedule.

## 2013-07-30 NOTE — Telephone Encounter (Signed)
Spoke with pt and advised that we would double book ok per CY pt wanting depo injeciton

## 2013-07-30 NOTE — Telephone Encounter (Signed)
Pt states she called here on 07/25/13 for flare up and was given Augmentin Rx; she has used this since Thursday and feels like she is not getting any better. She has been using her albuerol HFa and neb tx (up to QID) and Dulera inhaler. Pt states the congestion in her chest is not any better-cough sounded bad on phone; also having fevers. Pt has been around ICU patients at work, her sick husband and her sick granddaughter as well. Pt feels like she needs a steroid shot to help get over this. CY please advise. Thanks.   No Known Allergies

## 2013-07-30 NOTE — Progress Notes (Signed)
01/24/12- 6 yoF never smoker who comes to reestablish because of acute bronchitis. Last here 05/23/2006 when she was being followed for small lung nodules, asthmatic bronchitis, allergic rhinitis complicated by history of esophageal reflux and left partial nephrectomy for renal cell carcinoma recurrent in 2013/radio ablation.  She had worked as a Statistician. Now reports an acute bronchitis syndrome with onset around 11/15/2011. Initial early fever, scant clear sputum, now green discharge from nose and much wheezing. Treated so far with Augmentin and Levaquin that prednisone course 40 mg daily x6 days. She was restarted on Advair with a rescue inhaler. Nebulizer treatment has helped. Chest x-ray 9/ 2013-at [facility] says was clear. She is aware of some reflux. TUMS and ranitidine.  02/25/12-51 yoF never smoker who comes to reestablish because of acute bronchitis. FOLLOWS FOR: patient reports she feels "much better"--states dulera has made a great difference--no other concerns at this time Feeling much better. Nebulizer machine has been a big help initially. She has gone back from [facility] 200 to her familiar Advair is much less need for rescue inhaler. Occasional frontal and maxillary sinus pressure treated with Sudafed  08/07/12- 89 yoF never smoker who comes to reestablish because of acute bronchitis. FOLLOWS FOR: patient states she is doing much better than last visit; no flare ups yet. Using rescue inhaler once every 2 or 3 days. Little problems this spring with pollen. Continues Dulera 2 puffs twice daily. Has not needed nebulizer since last winter.  02/12/13- 14 yoF never smoker followed for recurrent acute bronchitis with asthma FOLLOWS FOR: Sinus and chest congestion w/ cough w/ yellow mucus. Onset in October of head congestion, post nasal drip and cough-thick green. Not much wheeze. Initial headache is gone. Ears were stopped up and popped-improved. Got Augmentin June 18, Biaxin  September 2. Continues Dulera 100 but did not find rescue inhaler helpful.  05/07/13- 52 yoF never smoker followed for recurrent acute bronchitis with asthma, Lung nodules FOLLOWS FOR:Pt states she has been doing well; Pt needs PA form filled to help get Ventolin HFA as Proair HFA does not help. UMR will not cover with out the letter/PA from [place]. Acute bronchitis late December  Being Rx'd for colitis. [place] abd [place] [facility]- Saw lung nodules. She had had nodules in past which had seemed to clear. She is to bring [place] that disk.  Wants to use Ventolin HFA, saying Proair doesn't get in as well. C/O frontal "sinus" headache.  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  On augmentin 1 week ago upper respiratory infection/bronchitis but kept working. Taking Augmentin,  Nebs, hydrocodone cough syrup. Says "Bactrim always works" CXR 05/08/13 IMPRESSION:  No acute abnormalities.  If patient has a history of lung nodule, recommend any prior imaging  be obtained for comparison, in order to determine if further workup  is required based on prior imaging.  Electronically Signed  By: Lavonia Dana M.D.  On: 05/08/2013 12:55   ROS-see HPI Constitutional:   No-   weight loss, night sweats, fevers, chills, fatigue, lassitude. HEENT:   +headaches, difficulty swallowing, tooth/dental problems, sore throat,       No-  sneezing, itching, ear ache, +nasal congestion, +post nasal drip,  CV:  No-  chest pain, orthopnea, PND, swelling in lower extremities, anasarca, dizziness, palpitations Resp: +shortness of breath with exertion or at rest.              +  productive cough,  + non-productive cough,  No- coughing up of blood.              +change in color of mucus. No- wheezing.   Skin: No-   rash or lesions. GI:  + heartburn, indigestion, No-abdominal pain, nausea,  vomiting,  GU: . MS:  No-   joint pain or swelling.   Neuro-     nothing unusual Psych:  No- change in mood or affect. No depression or anxiety.  No memory loss.  OBJ- Physical Exam General- Alert, Oriented, Affect-appropriate, Distress- none acute. Overweight. Skin- rash-none, lesions- none, excoriation- none Lymphadenopathy- none Head- atraumatic            Eyes- Gross vision intact, PERRLA, conjunctivae and secretions clear            Ears- ? Fluid TMS            Nose- no- turbinate edema, no-Septal dev, mucus, polyps, erosion, perforation             Throat- Mallampati II , mucosa-red , drainage- none, tonsils- atrophic, + hoarse Neck- flexible , trachea midline, no stridor , thyroid nl, carotid no bruit Chest - symmetrical excursion , unlabored           Heart/CV- RRR , no murmur , no gallop  , no rub, nl s1 s2                           - JVD- none , edema- none, stasis changes- none, varices- none           Lung- +coarse, unlabored, wheeze- none, cough- none , dullness-none, rub- none           Chest wall-  Abd-  Br/ Gen/ Rectal- Not done, not indicated Extrem- cyanosis- none, clubbing, none, atrophy- none, strength- nl Neuro- grossly intact to observation

## 2013-07-30 NOTE — Patient Instructions (Signed)
Depo 80  Script sent to [facility] for [employer] for cough syrup

## 2013-08-07 ENCOUNTER — Telehealth: Payer: Self-pay | Admitting: Internal Medicine

## 2013-08-07 MED ORDER — ALBUTEROL SULFATE (2.5 MG/3ML) 0.083% IN NEBU: 2.5000 mg | INHALATION_SOLUTION | Freq: Four times a day (QID) | RESPIRATORY_TRACT | Status: DC | PRN

## 2013-08-07 MED ORDER — PREDNISONE 10 MG PO TABS: ORAL_TABLET | ORAL | Status: DC

## 2013-08-07 NOTE — Telephone Encounter (Signed)
Offer prednisone taper 10 mg, # 20, 4 X 2 DAYS, 3 X 2 DAYS, 2 X 2 DAYS, 1 X 2 DAYS  

## 2013-08-07 NOTE — Telephone Encounter (Signed)
Called spoke with patient, advised of CY's recs as stated below.  Pt okay with this recommendation and verbalized her understanding.  Rx sent to verified pharmacy.  Pt does also request a refill on her Albuterol Neb Soln to the [place].  This has been done as well.  Pt aware to call the office for sooner follow up if her symptoms do not improve or worsen.  Nothing further needed at this time; will sign off.

## 2013-08-07 NOTE — Telephone Encounter (Signed)
Pt states that she was seen x 1 week ago by CDY. Completed 7day course Bactrim. Pt states that she still has a deep down wheeze that she cannot get rid of even with the use of her MDI and nebulizer. Pt states that she still has some chest congestion deep down with SOB. Pt states that she is more SOB than she was before she got sick and not yet reached her baseline.  Pt states that the steroid injection worked great x 1 week and then her symptoms returned.  Using Dulera 2 puff BID, Albuterol and Hydrocodone cough syrup daily (approx every 6 hrs). Pt states that she does not feel like she needs another abx. Denies fever.  Requesting further recommendations.  No Known Allergies  Please advise Dr Annamaria Boots. Thanks.

## 2013-08-25 DIAGNOSIS — J069 Acute upper respiratory infection, unspecified: Secondary | ICD-10-CM | POA: Insufficient documentation

## 2013-08-25 NOTE — Assessment & Plan Note (Signed)
Question mild serous otitis, rhinosinusitis and tracheobronchitis. She has more faith in Bactrim Plan-Depo-Medrol, Bactrim, refill hydrocodone cough syrup

## 2013-08-25 NOTE — Assessment & Plan Note (Signed)
Plan Depo-Medrol, refill cough syrup

## 2013-11-05 ENCOUNTER — Ambulatory Visit: Payer: 59 | Admitting: Internal Medicine

## 2013-11-14 ENCOUNTER — Ambulatory Visit: Payer: 59 | Admitting: Internal Medicine

## 2013-11-14 ENCOUNTER — Encounter (INDEPENDENT_AMBULATORY_CARE_PROVIDER_SITE_OTHER): Payer: Self-pay

## 2013-11-14 ENCOUNTER — Encounter: Payer: Self-pay | Admitting: Internal Medicine

## 2013-11-14 MED ORDER — MOMETASONE FURO-FORMOTEROL FUM 100-5 MCG/ACT IN AERO: INHALATION_SPRAY | RESPIRATORY_TRACT | Status: DC

## 2013-11-14 NOTE — Patient Instructions (Signed)
Please call as needed 

## 2013-11-14 NOTE — Progress Notes (Signed)
01/24/12- 26 yoF never smoker who comes to reestablish because of acute bronchitis. Last here 05/23/2006 when she was being followed for small lung nodules, asthmatic bronchitis, allergic rhinitis complicated by history of esophageal reflux and left partial nephrectomy for renal cell carcinoma recurrent in 2013/radio ablation.  She had worked as a Statistician. Now reports an acute bronchitis syndrome with onset around 11/15/2011. Initial early fever, scant clear sputum, now green discharge from nose and much wheezing. Treated so far with Augmentin and Levaquin that prednisone course 40 mg daily x6 days. She was restarted on Advair with a rescue inhaler. Nebulizer treatment has helped. Chest x-ray 9/ 2013-at [facility] says was clear. She is aware of some reflux. TUMS and ranitidine.  02/25/12-51 yoF never smoker who comes to reestablish because of acute bronchitis. FOLLOWS FOR: patient reports she feels "much better"--states dulera has made a great difference--no other concerns at this time Feeling much better. Nebulizer machine has been a big help initially. She has gone back from [facility] 200 to her familiar Advair is much less need for rescue inhaler. Occasional frontal and maxillary sinus pressure treated with Sudafed  08/07/12- 30 yoF never smoker who comes to reestablish because of acute bronchitis. FOLLOWS FOR: patient states she is doing much better than last visit; no flare ups yet. Using rescue inhaler once every 2 or 3 days. Little problems this spring with pollen. Continues Dulera 2 puffs twice daily. Has not needed nebulizer since last winter.  02/12/13- 29 yoF never smoker followed for recurrent acute bronchitis with asthma FOLLOWS FOR: Sinus and chest congestion w/ cough w/ yellow mucus. Onset in October of head congestion, post nasal drip and cough-thick green. Not much wheeze. Initial headache is gone. Ears were stopped up and popped-improved. Got Augmentin June 18, Biaxin  September 2. Continues Dulera 100 but did not find rescue inhaler helpful.  05/07/13- 52 yoF never smoker followed for recurrent acute bronchitis with asthma, Lung nodules FOLLOWS FOR:Pt states she has been doing well; Pt needs PA form filled to help get Ventolin HFA as Proair HFA does not help. UMR will not cover with out the letter/PA from [place]. Acute bronchitis late December  Being Rx'd for colitis. CT abd [place] [facility]- Saw lung nodules. She had had nodules in past which had seemed to clear. She is to bring [place] that disk.  Wants to use Ventolin HFA, saying Proair doesn't get in as well. C/O frontal "sinus" headache.  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  On augmentin 1 week ago upper respiratory infection/bronchitis but kept working. Taking Augmentin,  Nebs, hydrocodone cough syrup. Says "Bactrim always works" CXR 05/08/13 IMPRESSION:  No acute abnormalities.  If patient has a history of lung nodule, recommend any prior imaging  be obtained for comparison, in order to determine if further workup  is required based on prior imaging.  Electronically Signed  By: Lavonia Dana M.D.  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    ROS-see HPI Constitutional:   No-   weight loss, night sweats, fevers, chills, fatigue, lassitude. HEENT:   +headaches, difficulty swallowing, tooth/dental problems, sore throat,       No-  sneezing,  itching, ear ache, +nasal congestion, +post nasal drip,  CV:  No-  chest pain, orthopnea, PND, swelling in lower extremities, anasarca, dizziness, palpitations Resp: +shortness of breath with exertion or at rest.               +productive cough,  + non-productive cough,  No- coughing up of blood.              +change in color of mucus. No- wheezing.   Skin: No-   rash or lesions. GI:  + heartburn, indigestion, No-abdominal pain, nausea, vomiting,  GU: . MS:  No-   joint pain or swelling.   Neuro-     nothing unusual Psych:  No- change in mood or affect. No depression or anxiety.  No memory loss.  OBJ- Physical Exam General- Alert, Oriented, Affect-appropriate, Distress- none acute. Overweight. Skin- rash-none, lesions- none, excoriation- none Lymphadenopathy- none Head- atraumatic            Eyes- Gross vision intact, PERRLA, conjunctivae and secretions clear            Ears- ? Fluid TMS            Nose- no- turbinate edema, no-Septal dev, mucus, polyps, erosion, perforation             Throat- Mallampati II , mucosa-red , drainage- none, tonsils- atrophic, + hoarse Neck- flexible , trachea midline, no stridor , thyroid nl, carotid no bruit Chest - symmetrical excursion , unlabored           Heart/CV- RRR , no murmur , no gallop  , no rub, nl s1 s2                           - JVD- none , edema- none, stasis changes- none, varices- none           Lung- +coarse, unlabored, wheeze- none, cough- none , dullness-none, rub- none           Chest wall-  Abd-  Br/ Gen/ Rectal- Not done, not indicated Extrem- cyanosis- none, clubbing, none, atrophy- none, strength- nl Neuro- grossly intact to observation

## 2013-11-19 ENCOUNTER — Telehealth: Payer: Self-pay | Admitting: Internal Medicine

## 2013-11-19 MED ORDER — PREDNISONE 20 MG PO TABS: ORAL_TABLET | ORAL | Status: DC

## 2013-11-19 NOTE — Telephone Encounter (Signed)
Best bet would be to offer prednisone 20 mg, # 6,  2 today then one daily

## 2013-11-19 NOTE — Telephone Encounter (Signed)
Pt saw CDY 11/14/13. Pt reports on Friday she had to go help clean out her deceased mothers home. It was hot, dusty. It has caused her to have a coughing spell. Pt reports she has prod cough-yellow phlem, chest tx and wheezing w/ coughing spells. Pt is using her hand held nebs every couple hrs.  The MDI irritates her cough.  Please advise CDY thanks  Allergies  Allergen Reactions  . Codeine Itching     Current Outpatient Prescriptions on File Prior to Visit  Medication Sig Dispense Refill  . albuterol (PROVENTIL) (2.5 MG/3ML) 0.083% nebulizer solution Take 3 mLs (2.5 mg total) by nebulization every 6 (six) hours as needed for wheezing or shortness of breath.  75 mL  12  . albuterol (VENTOLIN HFA) 108 (90 BASE) MCG/ACT inhaler Inhale 2 puffs into the lungs every 6 (six) hours as needed for wheezing or shortness of breath.      . benzonatate (TESSALON) 200 MG capsule Take 1 capsule (200 mg total) by mouth 3 (three) times daily as needed for cough.  30 capsule  1  . hydrochlorothiazide (HYDRODIURIL) 25 MG tablet Take 25 mg by mouth daily.      . mometasone-formoterol (DULERA) 100-5 MCG/ACT AERO INHALE 2 PUFFS INTO THE LUNGS 2 (TWO) TIMES DAILY. RINSE MOUTH  1 Inhaler  10  . NEXIUM 40 MG capsule TAKE 2 CAPSULES (80 MG TOTAL) BY MOUTH DAILY BEFORE BREAKFAST.  60 capsule  0  . potassium chloride SA (K-DUR,KLOR-CON) 20 MEQ tablet Take 40 mEq by mouth 2 (two) times daily.      . Vortioxetine HBr (BRINTELLIX) 20 MG TABS Take 1 tablet by mouth daily.       No current facility-administered medications on file prior to visit.

## 2013-11-19 NOTE — Telephone Encounter (Signed)
Spoke with pt and advised of Dr Janee Morn recommendations.  Rx sent to pharmacy.  Pt verbalized understanding.

## 2013-12-13 ENCOUNTER — Encounter: Payer: Self-pay | Admitting: Internal Medicine

## 2013-12-14 ENCOUNTER — Telehealth: Payer: Self-pay | Admitting: Internal Medicine

## 2013-12-14 NOTE — Telephone Encounter (Signed)
Called and spoke with pt and she stated that she had a recent CT done yesterday morning at [facility]---they did send this to CY.  Pt stated that she has stopped coughing and she is feeling a little better.  Pt stated that she is off on Monday, Tuesday and Wednesday of next week.  She is wanting to see what CY recs for this and if she needs to come in to be seen before her appt on 03/06/2014.  CY please advise. Thanks  Last ov--11/14/2013 Next ov--03/06/2014  Allergies  Allergen Reactions  . Codeine Itching    Current Outpatient Prescriptions on File Prior to Visit  Medication Sig Dispense Refill  . albuterol (PROVENTIL) (2.5 MG/3ML) 0.083% nebulizer solution Take 3 mLs (2.5 mg total) by nebulization every 6 (six) hours as needed for wheezing or shortness of breath.  75 mL  12  . albuterol (VENTOLIN HFA) 108 (90 BASE) MCG/ACT inhaler Inhale 2 puffs into the lungs every 6 (six) hours as needed for wheezing or shortness of breath.      . benzonatate (TESSALON) 200 MG capsule Take 1 capsule (200 mg total) by mouth 3 (three) times daily as needed for cough.  30 capsule  1  . hydrochlorothiazide (HYDRODIURIL) 25 MG tablet Take 25 mg by mouth daily.      . mometasone-formoterol (DULERA) 100-5 MCG/ACT AERO INHALE 2 PUFFS INTO THE LUNGS 2 (TWO) TIMES DAILY. RINSE MOUTH  1 Inhaler  10  . NEXIUM 40 MG capsule TAKE 2 CAPSULES (80 MG TOTAL) BY MOUTH DAILY BEFORE BREAKFAST.  60 capsule  0  . potassium chloride SA (K-DUR,KLOR-CON) 20 MEQ tablet Take 40 mEq by mouth 2 (two) times daily.      . predniSONE (DELTASONE) 20 MG tablet Take 2 tablets today then 1 tablet daily until gone  6 tablet  0  . Vortioxetine HBr (BRINTELLIX) 20 MG TABS Take 1 tablet by mouth daily.       No current facility-administered medications on file prior to visit.

## 2013-12-14 NOTE — Telephone Encounter (Signed)
i have sent an email to the pt and she will be notified that CY will recheck the computer system for the results of the CT done at [facility].  Will forward back to [facility] .

## 2013-12-14 NOTE — Telephone Encounter (Signed)
I have not seen anything yet of a CT report on her from [facility]. Care Everywhere has nothing more recent than July. I will leave this query open and look again first of week.

## 2013-12-17 ENCOUNTER — Telehealth: Payer: Self-pay | Admitting: Internal Medicine

## 2013-12-17 ENCOUNTER — Ambulatory Visit (INDEPENDENT_AMBULATORY_CARE_PROVIDER_SITE_OTHER): Payer: 59 | Admitting: Internal Medicine

## 2013-12-17 ENCOUNTER — Encounter: Payer: Self-pay | Admitting: Internal Medicine

## 2013-12-17 VITALS — BP 124/72 | HR 71 | Ht 63.0 in | Wt 180.0 lb

## 2013-12-17 DIAGNOSIS — J4530 Mild persistent asthma, uncomplicated: Secondary | ICD-10-CM

## 2013-12-17 DIAGNOSIS — Z23 Encounter for immunization: Secondary | ICD-10-CM

## 2013-12-17 DIAGNOSIS — K219 Gastro-esophageal reflux disease without esophagitis: Secondary | ICD-10-CM

## 2013-12-17 DIAGNOSIS — J069 Acute upper respiratory infection, unspecified: Secondary | ICD-10-CM

## 2013-12-17 DIAGNOSIS — J45909 Unspecified asthma, uncomplicated: Secondary | ICD-10-CM

## 2013-12-17 DIAGNOSIS — R918 Other nonspecific abnormal finding of lung field: Secondary | ICD-10-CM

## 2013-12-17 MED ORDER — NEXIUM 40 MG PO PACK: 40.0000 mg | PACK | Freq: Every day | ORAL | Status: DC

## 2013-12-17 NOTE — Progress Notes (Signed)
01/24/12- 87 yoF never smoker who comes to reestablish because of acute bronchitis. Last here 05/23/2006 when she was being followed for small lung nodules, asthmatic bronchitis, allergic rhinitis complicated by history of esophageal reflux and left partial nephrectomy for renal cell carcinoma recurrent in 2013/radio ablation.  She had worked as a Statistician. Now reports an acute bronchitis syndrome with onset around 11/15/2011. Initial early fever, scant clear sputum, now green discharge from nose and much wheezing. Treated so far with Augmentin and Levaquin that prednisone course 40 mg daily x6 days. She was restarted on Advair with a rescue inhaler. Nebulizer treatment has helped. Chest x-ray 9/ 2013-at [facility] says was clear. She is aware of some reflux. TUMS and ranitidine.  02/25/12-51 yoF never smoker who comes to reestablish because of acute bronchitis. FOLLOWS FOR: patient reports she feels "much better"--states dulera has made a great difference--no other concerns at this time Feeling much better. Nebulizer machine has been a big help initially. She has gone back from [facility] 200 to her familiar Advair is much less need for rescue inhaler. Occasional frontal and maxillary sinus pressure treated with Sudafed  08/07/12- 21 yoF never smoker who comes to reestablish because of acute bronchitis. FOLLOWS FOR: patient states she is doing much better than last visit; no flare ups yet. Using rescue inhaler once every 2 or 3 days. Little problems this spring with pollen. Continues Dulera 2 puffs twice daily. Has not needed nebulizer since last winter.  02/12/13- 13 yoF never smoker followed for recurrent acute bronchitis with asthma FOLLOWS FOR: Sinus and chest congestion w/ cough w/ yellow mucus. Onset in October of head congestion, post nasal drip and cough-thick green. Not much wheeze. Initial headache is gone. Ears were stopped up and popped-improved. Got Augmentin June 18, Biaxin  September 2. Continues Dulera 100 but did not find rescue inhaler helpful.  05/07/13- 52 yoF never smoker followed for recurrent acute bronchitis with asthma, Lung nodules FOLLOWS FOR:Pt states she has been doing well; Pt needs PA form filled to help get Ventolin HFA as Proair HFA does not help. [employer] will not cover with out the letter/PA from [place]. Acute bronchitis late December  Being Rx'd for colitis. CT abd in [place]- Saw lung nodules. She had had nodules in past which had seemed to clear. She is to bring [place] that disk.  Wants to use Ventolin HFA, saying Proair doesn't get in as well. C/O frontal "sinus" headache.  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  On augmentin 1 week ago upper respiratory infection/bronchitis but kept working. Taking Augmentin,  Nebs, hydrocodone cough syrup. Says "Bactrim always works" CXR 05/08/13 IMPRESSION:  No acute abnormalities.  If patient has a history of lung nodule, recommend any prior imaging  be obtained for comparison, in order to determine if further workup  is required based on prior imaging.  Electronically Signed  By: Lavonia Dana M.D.  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  Brings disk Was at [facility] for follow-up of history of renal cell carcinoma and had CT searching for mets.   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 Now little cough producing  scant yellow sputum daily and occasional streak of blood. Night sweats which may be menopausal. No adenopathy. Prefers brand name Nexium for GERD control.  ROS-see HPI Constitutional:   No-   weight loss, night sweats, fevers, chills, fatigue, lassitude. HEENT:   +headaches, difficulty swallowing, tooth/dental problems, sore throat,       No-  sneezing, itching, ear ache, +nasal congestion, +post nasal drip,  CV:  No-  chest pain, orthopnea, PND, swelling in lower extremities, anasarca, dizziness, palpitations Resp: +shortness of breath with exertion or at rest.              +productive cough,  + non-productive cough,  + coughing up of blood.              +change in color of mucus. No- wheezing.   Skin: No-   rash or lesions. GI:  + heartburn, indigestion, No-abdominal pain, nausea, vomiting,  GU: . MS:  No-   joint pain or swelling.   Neuro-     nothing unusual Psych:  No- change in mood or affect. No depression or anxiety.  No memory loss.  OBJ- Physical Exam General- Alert, Oriented, Affect-appropriate, Distress- none acute. Overweight. Skin- rash-none, lesions- none, excoriation- none Lymphadenopathy- none Head- atraumatic            Eyes- Gross vision intact, PERRLA, conjunctivae and secretions clear            Ears- ? Fluid TMS            Nose- no- turbinate edema, no-Septal dev, mucus, polyps, erosion,                        perforation             Throat- Mallampati II , mucosa-red , drainage- none, tonsils- atrophic, +                      hoarse Neck- flexible , trachea midline, no stridor , thyroid nl, carotid no bruit Chest - symmetrical excursion , unlabored           Heart/CV- RRR , no murmur , no gallop  , no rub, nl s1 s2                           - JVD- none , edema- none, stasis changes- none, varices- none           Lung- clear, unlabored, wheeze- none, cough + slight dry , dullness-none,                            rub- none           Chest wall-  Abd-  Br/ Gen/  Rectal- Not done, not indicated Extrem- cyanosis- none, clubbing, none, atrophy- none, strength- nl Neuro- grossly intact to observation

## 2013-12-17 NOTE — Telephone Encounter (Signed)
Disk reviewed. Could only find mediastinal windows. She is going to bring another disk when able.

## 2013-12-17 NOTE — Telephone Encounter (Signed)
Patient is going to bring by hard disk of CT.

## 2013-12-17 NOTE — Telephone Encounter (Signed)
Per 12/17/13 OV: Script for brand name nexium sent   Called [facility] pharm and confirmed RX for brand name nexium was sent. Nothing further needed

## 2013-12-17 NOTE — Patient Instructions (Addendum)
Pneumococcal 23 pneumovax  Order- sputum culture - routine C&S, Fungal, AFB   Dx chronic bronchitis without exacerbation  If we were to culture Mycobacterium avium/ "atypical AFB" we might want your GI doctor's permission to treat with biaxin, rifampin and ethambutol, or a similar combination  Script for brand name nexium sent

## 2013-12-17 NOTE — Telephone Encounter (Signed)
Will forward to CY's box to wait for hard copy of CT disk.

## 2014-02-18 ENCOUNTER — Ambulatory Visit: Payer: 59 | Admitting: Internal Medicine

## 2014-03-06 ENCOUNTER — Ambulatory Visit (INDEPENDENT_AMBULATORY_CARE_PROVIDER_SITE_OTHER): Payer: 59 | Admitting: Internal Medicine

## 2014-03-06 ENCOUNTER — Encounter: Payer: Self-pay | Admitting: Internal Medicine

## 2014-03-06 ENCOUNTER — Encounter (INDEPENDENT_AMBULATORY_CARE_PROVIDER_SITE_OTHER): Payer: Self-pay

## 2014-03-06 VITALS — BP 122/80 | HR 84 | Ht 63.0 in | Wt 186.4 lb

## 2014-03-06 DIAGNOSIS — J4540 Moderate persistent asthma, uncomplicated: Secondary | ICD-10-CM

## 2014-03-06 DIAGNOSIS — R918 Other nonspecific abnormal finding of lung field: Secondary | ICD-10-CM

## 2014-03-06 NOTE — Patient Instructions (Signed)
Plan on the repeat CT scan in September, 2016 at [facility] as discussed.   Please call as needed. We will deal with the insurance and your inhaler as needed.

## 2014-03-06 NOTE — Progress Notes (Signed)
01/24/12- 89 yoF never smoker who comes to reestablish because of acute bronchitis. Last here 05/23/2006 when she was being followed for small lung nodules, asthmatic bronchitis, allergic rhinitis complicated by history of esophageal reflux and left partial nephrectomy for renal cell carcinoma recurrent in 2013/radio ablation.  She had worked as a Statistician. Now reports an acute bronchitis syndrome with onset around 11/15/2011. Initial early fever, scant clear sputum, now green discharge from nose and much wheezing. Treated so far with Augmentin and Levaquin that prednisone course 40 mg daily x6 days. She was restarted on Advair with a rescue inhaler. Nebulizer treatment has helped. Chest x-ray 9/ 2013-at [facility] says was clear. She is aware of some reflux. TUMS and ranitidine.  02/25/12-51 yoF never smoker who comes to reestablish because of acute bronchitis. FOLLOWS FOR: patient reports she feels "much better"--states dulera has made a great difference--no other concerns at this time Feeling much better. Nebulizer machine has been a big help initially. She has gone back from [facility] 200 to her familiar Advair is much less need for rescue inhaler. Occasional frontal and maxillary sinus pressure treated with Sudafed  08/07/12- 9 yoF never smoker who comes to reestablish because of acute bronchitis. FOLLOWS FOR: patient states she is doing much better than last visit; no flare ups yet. Using rescue inhaler once every 2 or 3 days. Little problems this spring with pollen. Continues Dulera 2 puffs twice daily. Has not needed nebulizer since last winter.  02/12/13- 59 yoF never smoker followed for recurrent acute bronchitis with asthma FOLLOWS FOR: Sinus and chest congestion w/ cough w/ yellow mucus. Onset in October of head congestion, post nasal drip and cough-thick green. Not much wheeze. Initial headache is gone. Ears were stopped up and popped-improved. Got Augmentin June 18, Biaxin  September 2. Continues Dulera 100 but did not find rescue inhaler helpful.  05/07/13- 52 yoF never smoker followed for recurrent acute bronchitis with asthma, Lung nodules FOLLOWS FOR:Pt states she has been doing well; Pt needs PA form filled to help get Ventolin HFA as Proair HFA does not help. [employer] will not cover with out the letter/PA from [place]. Acute bronchitis late December  Being Rx'd for colitis. CT abd in [place] [facility]- Saw lung nodules. She had had nodules in past which had seemed to clear. She is to bring [place] that disk.  Wants to use Ventolin HFA, saying Proair doesn't get in as well. C/O frontal "sinus" headache.  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  On augmentin 1 week ago upper respiratory infection/bronchitis but kept working. Taking Augmentin,  Nebs, hydrocodone cough syrup. Says "Bactrim always works" CXR 05/08/13 IMPRESSION:  No acute abnormalities.  If patient has a history of lung nodule, recommend any prior imaging  be obtained for comparison, in order to determine if further workup  is required based on prior imaging.  Electronically Signed  By: Lavonia Dana M.D.  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 Brings disk  Was sent to [facility] by Dr Baxter Flattery sed rate, ANA abnl. CT chest [facility] 12/13/13- report- RUL multiple ground glass, tree in bud c/w inflammation. I couldn't open  disk.   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 Unable to cough up sputum for culture as planned at last visit. Pending repeat CT chest/ abd/ pelvis at [facility] 9/ 2016 f/u hx renal cell CA.  ROS-see HPI Constitutional:   No-   weight loss, night sweats, fevers, chills, fatigue, lassitude. HEENT:   +headaches, difficulty swallowing, tooth/dental problems, sore throat,       No-  sneezing, itching, ear ache, +nasal congestion, +post nasal drip,  CV:  No-  chest pain, orthopnea, PND, swelling in lower extremities, anasarca, dizziness, palpitations Resp: +shortness of breath with exertion or at rest.             No-productive cough,  + non-productive cough,  No- coughing up of blood.              No-change in color of mucus. No- wheezing.   Skin: No-   rash or lesions. GI:  + heartburn, indigestion, No-abdominal pain, nausea, vomiting,  GU: . MS:  No-   joint pain or swelling.   Neuro-     nothing unusual Psych:  No- change in mood or affect. No depression or anxiety.  No memory loss.  OBJ- Physical Exam General- Alert, Oriented, Affect-appropriate, Distress- none acute. Overweight. Skin- rash-none, lesions- none, excoriation- none Lymphadenopathy- none Head- atraumatic            Eyes- Gross vision intact, PERRLA, conjunctivae and secretions clear            Ears- clear            Nose- no- turbinate edema, no-Septal dev, mucus, polyps, erosion,                                perforation             Throat- Mallampati II , mucosa-red , drainage- none, tonsils- atrophic, +                     hoarse Neck- flexible , trachea midline, no stridor , thyroid nl, carotid no bruit Chest - symmetrical  excursion , unlabored           Heart/CV- RRR , no murmur , no gallop  , no rub, nl s1 s2                           - JVD- none , edema- none, stasis changes- none, varices- none           Lung- +coarse, unlabored, wheeze- none, cough- none , dullness-none,                    rub- none           Chest wall-  Abd-  Br/ Gen/ Rectal- Not done, not indicated Extrem- cyanosis- none, clubbing, none, atrophy- none, strength- nl Neuro- grossly intact to observation

## 2014-03-07 DIAGNOSIS — K219 Gastro-esophageal reflux disease without esophagitis: Secondary | ICD-10-CM | POA: Insufficient documentation

## 2014-03-07 DIAGNOSIS — R918 Other nonspecific abnormal finding of lung field: Secondary | ICD-10-CM

## 2014-03-07 HISTORY — DX: Other nonspecific abnormal finding of lung field: R91.8

## 2014-03-07 NOTE — Assessment & Plan Note (Signed)
Nexium has worked better than other PPIs for symptom control plan-educated again on importance of reflux precautions to prevent aspiration

## 2014-03-07 NOTE — Assessment & Plan Note (Signed)
Better symptom control. She feels more "secure" with less cough, no productive cough, no wheezing. She credits [facility] which she says works better [employer].

## 2014-03-07 NOTE — Assessment & Plan Note (Signed)
Pattern of recurrent acute exacerbations of chronic bronchitis. Currently fair control

## 2014-03-07 NOTE — Assessment & Plan Note (Signed)
Pattern favors atypical infection such as MAIC Plan-sputum culture

## 2014-03-07 NOTE — Assessment & Plan Note (Signed)
Pattern on CT was consistent with [facility] but she denies active symptoms and has little or no cough now. If she were to remain this stable, we can wait until September 2016 for follow-up CT which is planned as [facility] to follow-up her renal cell cancer. She needs to establish a new primary physician as discussed.

## 2014-06-12 ENCOUNTER — Telehealth: Payer: Self-pay | Admitting: Internal Medicine

## 2014-06-12 NOTE — Telephone Encounter (Signed)
PA form for the dulera 100 was received and filled out and placed on CY cart to be signed so this can be faxed back.  Will forward to [place] to follow up on PA

## 2014-06-14 MED ORDER — BUDESONIDE-FORMOTEROL FUMARATE 160-4.5 MCG/ACT IN AERO: 2.0000 | INHALATION_SPRAY | Freq: Two times a day (BID) | RESPIRATORY_TRACT | Status: DC

## 2014-06-14 NOTE — Telephone Encounter (Signed)
PA was denied for dulera since the pt must try and fail 2 of the following medications:   advair diskus, advair HFA, Breo ellipta, symbicort.   Pt has tried and failed advair diskus.  Per CY---change the dulera to symbicort 160  2 puffs BID rinse after each use.  Called and spoke with pt and she is aware of med change per CY and nothing further is needed.

## 2014-07-16 ENCOUNTER — Ambulatory Visit: Payer: 59 | Admitting: Internal Medicine

## 2014-10-23 DIAGNOSIS — R748 Abnormal levels of other serum enzymes: Secondary | ICD-10-CM | POA: Insufficient documentation

## 2014-10-23 DIAGNOSIS — F329 Major depressive disorder, single episode, unspecified: Secondary | ICD-10-CM | POA: Insufficient documentation

## 2015-01-07 ENCOUNTER — Other Ambulatory Visit: Payer: Self-pay | Admitting: Internal Medicine

## 2015-03-05 ENCOUNTER — Encounter: Payer: Self-pay | Admitting: Family Medicine

## 2015-03-05 ENCOUNTER — Ambulatory Visit (INDEPENDENT_AMBULATORY_CARE_PROVIDER_SITE_OTHER): Payer: 59 | Admitting: Family Medicine

## 2015-03-05 VITALS — BP 119/77 | HR 80 | Temp 98.6°F | Resp 18 | Ht 62.5 in | Wt 187.0 lb

## 2015-03-05 DIAGNOSIS — Z8719 Personal history of other diseases of the digestive system: Secondary | ICD-10-CM | POA: Insufficient documentation

## 2015-03-05 DIAGNOSIS — F419 Anxiety disorder, unspecified: Secondary | ICD-10-CM | POA: Diagnosis not present

## 2015-03-05 DIAGNOSIS — I10 Essential (primary) hypertension: Secondary | ICD-10-CM

## 2015-03-05 DIAGNOSIS — E785 Hyperlipidemia, unspecified: Secondary | ICD-10-CM | POA: Diagnosis not present

## 2015-03-05 DIAGNOSIS — Z7689 Persons encountering health services in other specified circumstances: Secondary | ICD-10-CM

## 2015-03-05 DIAGNOSIS — R918 Other nonspecific abnormal finding of lung field: Secondary | ICD-10-CM

## 2015-03-05 DIAGNOSIS — R748 Abnormal levels of other serum enzymes: Secondary | ICD-10-CM

## 2015-03-05 DIAGNOSIS — F329 Major depressive disorder, single episode, unspecified: Secondary | ICD-10-CM

## 2015-03-05 DIAGNOSIS — R945 Abnormal results of liver function studies: Secondary | ICD-10-CM

## 2015-03-05 DIAGNOSIS — Z85528 Personal history of other malignant neoplasm of kidney: Secondary | ICD-10-CM | POA: Insufficient documentation

## 2015-03-05 DIAGNOSIS — Z1239 Encounter for other screening for malignant neoplasm of breast: Secondary | ICD-10-CM

## 2015-03-05 DIAGNOSIS — C649 Malignant neoplasm of unspecified kidney, except renal pelvis: Secondary | ICD-10-CM

## 2015-03-05 DIAGNOSIS — Z6833 Body mass index (BMI) 33.0-33.9, adult: Secondary | ICD-10-CM | POA: Diagnosis not present

## 2015-03-05 DIAGNOSIS — K760 Fatty (change of) liver, not elsewhere classified: Secondary | ICD-10-CM | POA: Insufficient documentation

## 2015-03-05 HISTORY — DX: Essential (primary) hypertension: I10

## 2015-03-05 NOTE — Progress Notes (Signed)
Patient ID: [name], female   DOB: 09/09/1960, 54 y.o.   MRN: [id number]      Patient ID: [name], _0 @    DOB: 09-02-1960, 54 y.o.   MRN: [id number]  Subjective:  Patient presents for new patient establishment . All past medical history, surgical history, allergies, family history, immunizations and social history was obtained from the patient today and entered into the electronic medical record. Records are requested from her prior PCP, and will be reviewed at the time they are received. All medical records will be updated at that time.  All recent labs, ED visits and hospitalizations within the last year were reviewed.  Hyperlipidemia: Patient states she has a history of hyperlipidemia, which was checked approximately one month ago. Her psychiatric medications also cause her to have increase in her cholesterol. She is currently not on any medications for her elevated cholesterol. She is attempting to watch her diet, choosing healthier options and exercising more frequently. She is not taking fish oil supplementation. She has a history of elevated liver enzymes.  Hypertension: Patient is compliant with HCTZ 25 mg daily. She denies any side effects, chest pain, shortness of breath, lower extremity edema.  Anxiety/depression: Patient is closely monitored by Dr.Kaur for anxiety and depression. She is prescribed Symbyax, Brintellix and Adderall. She is currently on short-term disability for her anxiety and depression. Patient has had the recent loss of her mother within the past year. She has her son and her granddaughter living with her, which she admits she enjoys, but does have added stress. He was the caretaker up with her parents who both had Alzheimer's. She had also been diagnosed with renal cell carcinoma. She feels like she is doing much better on the current medications, and continues to follow with psychiatry.  Asthma with bronchitis, moderate persistent/multiple  pulmonary nodules: Patient follows with Dr. Annamaria Boots, pulmonology. She is prescribed albuterol and Symbicort. She reports compliance with  inhalers and is using them correctly. Patient to follow-up with pulmonology. CT scans are followed through [facility].  Health maintenance:  Colonoscopy: No family history. Last colonoscopy 2015, follow-up every 5 years or colitis.  Mammogram: No family history. Last mammogram 2013, mammogram ordered today. Cervical cancer screening: Last Pap 3 years ago, abnormal Paps. Will have Pap smear completed with complete physical. Immunizations: Unknown immunization record, up-to-date on flu shot. Records requested. Infectious disease screening: Unknown HIV and hepatitis C screening, records are requested. DEXA: Screening to start at age 6 Assistive device: None Oxygen use: None Patient has a Dental home. Hospitalizations/ED visits: Reviewed gastroenterology visits at [facility]. All of the records requested.  Past Medical History  Diagnosis Date  . Anxiety   . Depression   . Renal cell carcinoma     hx of  . Colitis    Allergies  Allergen Reactions  . Codeine Itching   Past Surgical History  Procedure Laterality Date  . Cholecystectomy    . Tonsillectomy    . Nephrectomy      partial LT  . Fetal radio frequency ablation     Family History  Problem Relation Age of Onset  . Hypertension Mother   . Hypertension Father    Social History   Social History  . Marital Status: Married    Spouse Name: N/A  . Number of Children: N/A  . Years of Education: N/A   Occupational History  . Not on file.   Social History Main Topics  . Smoking status: Never  Smoker   . Smokeless tobacco: Never Used  . Alcohol Use: No  . Drug Use: No  . Sexual Activity: Not on file   Other Topics Concern  . Not on file   Social History Narrative    Objective: BP 119/77 mmHg  Pulse 80  Temp(Src) 98.6 F (37 C) (Temporal)  Resp 18  Ht 5' 2.5" (1.588 m)   Wt 187 lb (84.823 kg)  BMI 33.64 kg/m2  SpO2 97%  LMP 04/03/2013 Gen: Afebrile. No acute distress. Nontoxic in appearance, well-developed, well-nourished, female. Talkative, pleasant.  HENT: AT. [place]. Bilateral TM visualized rt TM normal in appearence, left TM with mild fluid/? Redness posterior TM, normal external auditory canal. MMM, no oral lesions,   Eyes:Pupils Equal Round Reactive to light, Extraocular movements intact,  Conjunctiva without redness, discharge or icterus. Neck/lymp/endocrine: Supple,No lymphadenopathy,  CV: RRR, No edema, +2/4 P posterior tibialis pulses. Chest: CTAB, no wheeze, rhonchi or crackles.  Abd: Soft. obese. NTND. BS present. No Masses palpated. No hepatosplenomegaly. No rebound tenderness or guarding. Skin: No rashes, purpura or petechiae.  Neuro/Msk:  Normal gait. PERLA. EOMi. Alert. Oriented x3.   Psych: Appears anxious and sad at times becomes tearful. Normal  dress and demeanor. Normal speech. Normal thought content and judgment.   Assessment/plan: Philomina Leon is a 54 y.o. female present for establishment of care. Health maintenance:  Colonoscopy: No family history. Last colonoscopy 2015, follow-up every 5 years or colitis.  Mammogram: No family history. Last mammogram 2013, mammogram ordered today. Cervical cancer screening: Last Pap 3 years ago, abnormal Paps. Will have Pap smear completed with complete physical in December, with fasting labs prior. Immunizations: Unknown immunization record, up-to-date on flu shot. Records requested. Infectious disease screening: Unknown HIV and hepatitis C screening, records are requested. DEXA: Screening to start at age 36 Patient was encouraged to exercise greater than 150 minutes a week. Patient was encouraged to choose a diet filled with fresh fruits and vegetables, and lean meats. AVS provided to patient today for education/recommendation on gender specific health and safety maintenance. I'll health  care maintenance will be called at the time of her complete physical with records have been reviewed.   Hyperlipidemia - Lipid panel; Future - TSH; Future   Anxiety and depression - Continue current medications and following with psychiatry.  Essential hypertension, benign - Continue HCTZ 25 mg daily. - CBC w/Diff; Future - Comp Met (CMET); Future  BMI 33.0-33.9,adult - HgB A1c; Future  Multiple pulmonary nodules determined by computed tomography of lung - Patient will continue monitoring through pulmonology, Dr. Annamaria Boots.  Renal cell carcinoma, unspecified laterality [facility]) Patient to continue follow-ups with [facility]  Elevated liver enzymes/Hepatic steatosis/History of colitis - Patient to continue follow-up with gastroenterologist Dr. Joyce Gross [facility])  1-2 months for CPE with PAP and fasting labs    Howard Pouch, [place]

## 2015-03-05 NOTE — Progress Notes (Signed)
Pre visit review using our clinic review tool, if applicable. No additional management support is needed unless otherwise documented below in the visit note. 

## 2015-03-05 NOTE — Patient Instructions (Signed)
It was a pleasure meeting you today. We will complete a preventive/PAP in 1 month. Make appt, for fasting labs a few days prior and we will review it all together.  Try to get records sent from prior PCP.   Health Maintenance, Female Adopting a healthy lifestyle and getting preventive care can go a long way to promote health and wellness. Talk with your health care provider about what schedule of regular examinations is right for you. This is a good chance for you to check in with your provider about disease prevention and staying healthy. In between checkups, there are plenty of things you can do on your own. Experts have done a lot of research about which lifestyle changes and preventive measures are most likely to keep you healthy. Ask your health care provider for more information. WEIGHT AND DIET  Eat a healthy diet  Be sure to include plenty of vegetables, fruits, low-fat dairy products, and lean protein.  Do not eat a lot of foods high in solid fats, added sugars, or salt.  Get regular exercise. This is one of the most important things you can do for your health.  Most adults should exercise for at least 150 minutes each week. The exercise should increase your heart rate and make you sweat (moderate-intensity exercise).  Most adults should also do strengthening exercises at least twice a week. This is in addition to the moderate-intensity exercise.  Maintain a healthy weight  Body mass index (BMI) is a measurement that can be used to identify possible weight problems. It estimates body fat based on height and weight. Your health care provider can help determine your BMI and help you achieve or maintain a healthy weight.  For females 64 years of age and older:   A BMI below 18.5 is considered underweight.  A BMI of 18.5 to 24.9 is normal.  A BMI of 25 to 29.9 is considered overweight.  A BMI of 30 and above is considered obese.  Watch levels of cholesterol and blood  lipids  You should start having your blood tested for lipids and cholesterol at 54 years of age, then have this test every 5 years.  You may need to have your cholesterol levels checked more often if:  Your lipid or cholesterol levels are high.  You are older than 54 years of age.  You are at high risk for heart disease.  CANCER SCREENING   Lung Cancer  Lung cancer screening is recommended for adults 15-47 years old who are at high risk for lung cancer because of a history of smoking.  A yearly low-dose CT scan of the lungs is recommended for people who:  Currently smoke.  Have quit within the past 15 years.  Have at least a 30-pack-year history of smoking. A pack year is smoking an average of one pack of cigarettes a day for 1 year.  Yearly screening should continue until it has been 15 years since you quit.  Yearly screening should stop if you develop a health problem that would prevent you from having lung cancer treatment.  Breast Cancer  Practice breast self-awareness. This means understanding how your breasts normally appear and feel.  It also means doing regular breast self-exams. Let your health care provider know about any changes, no matter how small.  If you are in your 20s or 30s, you should have a clinical breast exam (CBE) by a health care provider every 1-3 years as part of a regular health exam.  If you are 40 or older, have a CBE every year. Also consider having a breast X-ray (mammogram) every year.  If you have a family history of breast cancer, talk to your health care provider about genetic screening.  If you are at high risk for breast cancer, talk to your health care provider about having an MRI and a mammogram every year.  Breast cancer gene (BRCA) assessment is recommended for women who have family members with BRCA-related cancers. BRCA-related cancers include:  Breast.  Ovarian.  Tubal.  Peritoneal cancers.  Results of the assessment  will determine the need for genetic counseling and BRCA1 and BRCA2 testing. Cervical Cancer Your health care provider may recommend that you be screened regularly for cancer of the pelvic organs (ovaries, uterus, and vagina). This screening involves a pelvic examination, including checking for microscopic changes to the surface of your cervix (Pap test). You may be encouraged to have this screening done every 3 years, beginning at age 24.  For women ages 59-65, health care providers may recommend pelvic exams and Pap testing every 3 years, or they may recommend the Pap and pelvic exam, combined with testing for human papilloma virus (HPV), every 5 years. Some types of HPV increase your risk of cervical cancer. Testing for HPV may also be done on women of any age with unclear Pap test results.  Other health care providers may not recommend any screening for nonpregnant women who are considered low risk for pelvic cancer and who do not have symptoms. Ask your health care provider if a screening pelvic exam is right for you.  If you have had past treatment for cervical cancer or a condition that could lead to cancer, you need Pap tests and screening for cancer for at least 20 years after your treatment. If Pap tests have been discontinued, your risk factors (such as having a new sexual partner) need to be reassessed to determine if screening should resume. Some women have medical problems that increase the chance of getting cervical cancer. In these cases, your health care provider may recommend more frequent screening and Pap tests. Colorectal Cancer  This type of cancer can be detected and often prevented.  Routine colorectal cancer screening usually begins at 54 years of age and continues through 54 years of age.  Your health care provider may recommend screening at an earlier age if you have risk factors for colon cancer.  Your health care provider may also recommend using home test kits to check  for hidden blood in the stool.  A small camera at the end of a tube can be used to examine your colon directly (sigmoidoscopy or colonoscopy). This is done to check for the earliest forms of colorectal cancer.  Routine screening usually begins at age 26.  Direct examination of the colon should be repeated every 5-10 years through 54 years of age. However, you may need to be screened more often if early forms of precancerous polyps or small growths are found. Skin Cancer  Check your skin from head to toe regularly.  Tell your health care provider about any new moles or changes in moles, especially if there is a change in a mole's shape or color.  Also tell your health care provider if you have a mole that is larger than the size of a pencil eraser.  Always use sunscreen. Apply sunscreen liberally and repeatedly throughout the day.  Protect yourself by wearing long sleeves, pants, a wide-brimmed hat, and sunglasses whenever you  are outside. HEART DISEASE, DIABETES, AND HIGH BLOOD PRESSURE   High blood pressure causes heart disease and increases the risk of stroke. High blood pressure is more likely to develop in:  People who have blood pressure in the high end of the normal range (130-139/85-89 mm Hg).  People who are overweight or obese.  People who are African American.  If you are 18-39 years of age, have your blood pressure checked every 3-5 years. If you are 40 years of age or older, have your blood pressure checked every year. You should have your blood pressure measured twice--once when you are at a hospital or clinic, and once when you are not at a hospital or clinic. Record the average of the two measurements. To check your blood pressure when you are not at a hospital or clinic, you can use:  An automated blood pressure machine at a pharmacy.  A home blood pressure monitor.  If you are between 55 years and 79 years old, ask your health care provider if you should take  aspirin to prevent strokes.  Have regular diabetes screenings. This involves taking a blood sample to check your fasting blood sugar level.  If you are at a normal weight and have a low risk for diabetes, have this test once every three years after 54 years of age.  If you are overweight and have a high risk for diabetes, consider being tested at a younger age or more often. PREVENTING INFECTION  Hepatitis B  If you have a higher risk for hepatitis B, you should be screened for this virus. You are considered at high risk for hepatitis B if:  You were born in a country where hepatitis B is common. Ask your health care provider which countries are considered high risk.  Your parents were born in a high-risk country, and you have not been immunized against hepatitis B (hepatitis B vaccine).  You have HIV or AIDS.  You use needles to inject street drugs.  You live with someone who has hepatitis B.  You have had sex with someone who has hepatitis B.  You get hemodialysis treatment.  You take certain medicines for conditions, including cancer, organ transplantation, and autoimmune conditions. Hepatitis C  Blood testing is recommended for:  Everyone born from 1945 through 1965.  Anyone with known risk factors for hepatitis C. Sexually transmitted infections (STIs)  You should be screened for sexually transmitted infections (STIs) including gonorrhea and chlamydia if:  You are sexually active and are younger than 54 years of age.  You are older than 54 years of age and your health care provider tells you that you are at risk for this type of infection.  Your sexual activity has changed since you were last screened and you are at an increased risk for chlamydia or gonorrhea. Ask your health care provider if you are at risk.  If you do not have HIV, but are at risk, it may be recommended that you take a prescription medicine daily to prevent HIV infection. This is called  pre-exposure prophylaxis (PrEP). You are considered at risk if:  You are sexually active and do not regularly use condoms or know the HIV status of your partner(s).  You take drugs by injection.  You are sexually active with a partner who has HIV. Talk with your health care provider about whether you are at high risk of being infected with HIV. If you choose to begin PrEP, you should first be tested for   HIV. You should then be tested every 3 months for as long as you are taking PrEP.  PREGNANCY   If you are premenopausal and you may become pregnant, ask your health care provider about preconception counseling.  If you may become pregnant, take 400 to 800 micrograms (mcg) of folic acid every day.  If you want to prevent pregnancy, talk to your health care provider about birth control (contraception). OSTEOPOROSIS AND MENOPAUSE   Osteoporosis is a disease in which the bones lose minerals and strength with aging. This can result in serious bone fractures. Your risk for osteoporosis can be identified using a bone density scan.  If you are 65 years of age or older, or if you are at risk for osteoporosis and fractures, ask your health care provider if you should be screened.  Ask your health care provider whether you should take a calcium or vitamin D supplement to lower your risk for osteoporosis.  Menopause may have certain physical symptoms and risks.  Hormone replacement therapy may reduce some of these symptoms and risks. Talk to your health care provider about whether hormone replacement therapy is right for you.  HOME CARE INSTRUCTIONS   Schedule regular health, dental, and eye exams.  Stay current with your immunizations.   Do not use any tobacco products including cigarettes, chewing tobacco, or electronic cigarettes.  If you are pregnant, do not drink alcohol.  If you are breastfeeding, limit how much and how often you drink alcohol.  Limit alcohol intake to no more than 1  drink per day for nonpregnant women. One drink equals 12 ounces of beer, 5 ounces of wine, or 1 ounces of hard liquor.  Do not use street drugs.  Do not share needles.  Ask your health care provider for help if you need support or information about quitting drugs.  Tell your health care provider if you often feel depressed.  Tell your health care provider if you have ever been abused or do not feel safe at home.   This information is not intended to replace advice given to you by your health care provider. Make sure you discuss any questions you have with your health care provider.   Document Released: 10/05/2010 Document Revised: 04/12/2014 Document Reviewed: 02/21/2013 Elsevier Interactive Patient Education 2016 [employer]  

## 2015-03-07 ENCOUNTER — Ambulatory Visit (INDEPENDENT_AMBULATORY_CARE_PROVIDER_SITE_OTHER)
Admission: RE | Admit: 2015-03-07 | Discharge: 2015-03-07 | Disposition: A | Discharge status: Home / Self Care | Payer: 59 | Source: Ambulatory Visit | Attending: Internal Medicine | Admitting: Internal Medicine

## 2015-03-07 ENCOUNTER — Ambulatory Visit (INDEPENDENT_AMBULATORY_CARE_PROVIDER_SITE_OTHER): Payer: 59 | Admitting: Internal Medicine

## 2015-03-07 ENCOUNTER — Encounter: Payer: Self-pay | Admitting: Internal Medicine

## 2015-03-07 VITALS — BP 120/76 | HR 78 | Ht 63.0 in | Wt 187.0 lb

## 2015-03-07 DIAGNOSIS — K219 Gastro-esophageal reflux disease without esophagitis: Secondary | ICD-10-CM | POA: Diagnosis not present

## 2015-03-07 DIAGNOSIS — R918 Other nonspecific abnormal finding of lung field: Secondary | ICD-10-CM

## 2015-03-07 DIAGNOSIS — A310 Pulmonary mycobacterial infection: Secondary | ICD-10-CM | POA: Diagnosis not present

## 2015-03-07 MED ORDER — BUDESONIDE-FORMOTEROL FUMARATE 160-4.5 MCG/ACT IN AERO: 2.0000 | INHALATION_SPRAY | Freq: Two times a day (BID) | RESPIRATORY_TRACT | Status: DC

## 2015-03-07 MED ORDER — ALBUTEROL SULFATE HFA 108 (90 BASE) MCG/ACT IN AERS: 2.0000 | INHALATION_SPRAY | Freq: Four times a day (QID) | RESPIRATORY_TRACT | Status: DC | PRN

## 2015-03-07 NOTE — Assessment & Plan Note (Signed)
Frequent throat clearing is suggestive. Reflux precautions emphasized

## 2015-03-07 NOTE — Assessment & Plan Note (Signed)
We have thought she probably has MAIC, but without progressive disease based on chest x-ray and symptoms Plan-chest x-ray

## 2015-03-07 NOTE — Progress Notes (Signed)
01/24/12- 89 yoF never smoker who comes to reestablish because of acute bronchitis. Last here 05/23/2006 when she was being followed for small lung nodules, asthmatic bronchitis, allergic rhinitis complicated by history of esophageal reflux and left partial nephrectomy for renal cell carcinoma recurrent in 2013/radio ablation.  She had worked as a Statistician. Now reports an acute bronchitis syndrome with onset around 11/15/2011. Initial early fever, scant clear sputum, now green discharge from nose and much wheezing. Treated so far with Augmentin and Levaquin that prednisone course 40 mg daily x6 days. She was restarted on Advair with a rescue inhaler. Nebulizer treatment has helped. Chest x-ray 9/ 2013-at [facility] says was clear. She is aware of some reflux. TUMS and ranitidine.  02/25/12-51 yoF never smoker who comes to reestablish because of acute bronchitis. FOLLOWS FOR: patient reports she feels "much better"--states dulera has made a great difference--no other concerns at this time Feeling much better. Nebulizer machine has been a big help initially. She has gone back from [facility] 200 to her familiar Advair is much less need for rescue inhaler. Occasional frontal and maxillary sinus pressure treated with Sudafed  08/07/12- 9 yoF never smoker who comes to reestablish because of acute bronchitis. FOLLOWS FOR: patient states she is doing much better than last visit; no flare ups yet. Using rescue inhaler once every 2 or 3 days. Little problems this spring with pollen. Continues Dulera 2 puffs twice daily. Has not needed nebulizer since last winter.  02/12/13- 59 yoF never smoker followed for recurrent acute bronchitis with asthma FOLLOWS FOR: Sinus and chest congestion w/ cough w/ yellow mucus. Onset in October of head congestion, post nasal drip and cough-thick green. Not much wheeze. Initial headache is gone. Ears were stopped up and popped-improved. Got Augmentin June 18, Biaxin  September 2. Continues Dulera 100 but did not find rescue inhaler helpful.  05/07/13- 52 yoF never smoker followed for recurrent acute bronchitis with asthma, Lung nodules FOLLOWS FOR:Pt states she has been doing well; Pt needs PA form filled to help get Ventolin HFA as Proair HFA does not help. [employer] will not cover with out the letter/PA from [place]. Acute bronchitis late December  Being Rx'd for colitis. CT abd in [place] [facility]- Saw lung nodules. She had had nodules in past which had seemed to clear. She is to bring [place] that disk.  Wants to use Ventolin HFA, saying Proair doesn't get in as well. C/O frontal "sinus" headache.  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  On augmentin 1 week ago upper respiratory infection/bronchitis but kept working. Taking Augmentin,  Nebs, hydrocodone cough syrup. Says "Bactrim always works" CXR 05/08/13 IMPRESSION:  No acute abnormalities.  If patient has a history of lung nodule, recommend any prior imaging  be obtained for comparison, in order to determine if further workup  is required based on prior imaging.  Electronically Signed  By: Lavonia Dana M.D.  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 Brings disk  Was sent to [facility] by Dr Baxter Flattery sed rate, ANA abnl. CT chest [facility] 12/13/13- report- RUL multiple ground glass, tree in bud c/w inflammation. I couldn't open  disk.   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 Unable to cough up sputum for culture as planned at last visit. Pending repeat CT chest/ abd/ pelvis at [facility] 9/ 2015 f/u hx renal cell CA.  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 MAIC, GERD, complicated by hx renal cell CA FOLLOWS FOR:Pt states she has done well overall; would like to discuss her yearly follow up times. Says lungs feel "great" especially since she moved to a new home. She questions if there might of been a respiratory irritant associated with old home but unclear. Perfume of a coworker tightness or chest and she is going to speak to her Freight forwarder. She is not sure she needs Symbicort and we discussed tapering off it with observation for trial. Very rare need for rescue inhaler. CT chest/abdomen 12/13/2013/[facility] (care everywhere)-Ground glass, tree-in-bud, and micronodular opacities in a bronchovascular pattern involving the right upper lobe are favored to be related to an infectious process. Recommend repeat chest CT after appropriate therapy to document resolution. No definite evidence of recurrent neoplasm or metastatic disease.  ROS-see HPI Constitutional:   No-   weight loss, night sweats, fevers, chills, fatigue, lassitude. HEENT:   +headaches, difficulty swallowing, tooth/dental problems, sore throat,       No-  sneezing, itching, ear ache, +nasal congestion, +post nasal drip,  CV:  No-  chest pain, orthopnea, PND, swelling in lower extremities, anasarca, dizziness, palpitations Resp: +shortness of breath with exertion or at rest.              No-productive cough,  + non-productive cough,  No- coughing up of blood.              No-change in color of mucus. No- wheezing.   Skin: No-   rash or lesions. GI:  + heartburn, indigestion, No-abdominal pain, nausea, vomiting,  GU: . MS:  No-   joint pain or swelling.   Neuro-     nothing unusual Psych:  No- change in mood or affect. No depression or anxiety.  No memory loss.  OBJ- Physical Exam General- Alert, Oriented, Affect-appropriate, Distress- none acute. + Overweight. Skin- rash-none, lesions- none, excoriation- none Lymphadenopathy- none Head- atraumatic            Eyes- Gross vision intact, PERRLA, conjunctivae and secretions clear            Ears- clear            Nose- no- turbinate edema, no-Septal dev, mucus, polyps, erosion, perforation             Throat- Mallampati II , mucosa-red , drainage- none, tonsils- atrophic, + frequent throat clearing Neck- flexible , trachea midline, no stridor , thyroid nl, carotid no bruit Chest - symmetrical excursion , unlabored           Heart/CV- RRR , no murmur , no gallop  , no rub, nl s1 s2                           -  JVD- none , edema- none, stasis changes- none, varices- none           Lung- clear unlabored, wheeze- none, cough- none , dullness-none, rub- none           Chest wall-  Abd-  Br/ Gen/ Rectal- Not done, not indicated Extrem- cyanosis- none, clubbing, none, atrophy- none, strength- nl Neuro- grossly intact to observation

## 2015-03-07 NOTE — Patient Instructions (Addendum)
Order- CXR  [place] to explore what Symbicort is doing for you- try 1 puff twice daily for a month. If you find you are needing more of your rescue inhaler then go back. If you are fine, then you can try dropping Symbicort off and see how you do.   Scripts sent refilling Symbicort and your albuterol rescue inhaler  Please call as needed

## 2015-03-18 ENCOUNTER — Other Ambulatory Visit: Payer: 59

## 2015-03-20 ENCOUNTER — Other Ambulatory Visit (INDEPENDENT_AMBULATORY_CARE_PROVIDER_SITE_OTHER): Payer: 59

## 2015-03-20 DIAGNOSIS — Z6833 Body mass index (BMI) 33.0-33.9, adult: Secondary | ICD-10-CM | POA: Diagnosis not present

## 2015-03-20 DIAGNOSIS — I10 Essential (primary) hypertension: Secondary | ICD-10-CM | POA: Diagnosis not present

## 2015-03-20 DIAGNOSIS — E785 Hyperlipidemia, unspecified: Secondary | ICD-10-CM

## 2015-03-20 LAB — CBC WITH DIFFERENTIAL/PLATELET
BASOS PCT: 0.6 % (ref 0.0–3.0)
Basophils Absolute: 0 10*3/uL (ref 0.0–0.1)
EOS PCT: 7.3 % — AB (ref 0.0–5.0)
Eosinophils Absolute: 0.4 10*3/uL (ref 0.0–0.7)
HCT: 41.4 % (ref 36.0–46.0)
Hemoglobin: 13.4 g/dL (ref 12.0–15.0)
LYMPHS ABS: 2 10*3/uL (ref 0.7–4.0)
Lymphocytes Relative: 32.1 % (ref 12.0–46.0)
MCHC: 32.4 g/dL (ref 30.0–36.0)
MCV: 87.2 fl (ref 78.0–100.0)
MONOS PCT: 5.2 % (ref 3.0–12.0)
Monocytes Absolute: 0.3 10*3/uL (ref 0.1–1.0)
NEUTROS ABS: 3.4 10*3/uL (ref 1.4–7.7)
NEUTROS PCT: 54.8 % (ref 43.0–77.0)
PLATELETS: 393 10*3/uL (ref 150.0–400.0)
RBC: 4.74 Mil/uL (ref 3.87–5.11)
RDW: 15.5 % (ref 11.5–15.5)
WBC: 6.2 10*3/uL (ref 4.0–10.5)

## 2015-03-20 LAB — COMPREHENSIVE METABOLIC PANEL
ALBUMIN: 3.8 g/dL (ref 3.5–5.2)
ALK PHOS: 165 U/L — AB (ref 39–117)
ALT: 58 U/L — AB (ref 0–35)
AST: 40 U/L — AB (ref 0–37)
BILIRUBIN TOTAL: 0.5 mg/dL (ref 0.2–1.2)
BUN: 12 mg/dL (ref 6–23)
CO2: 34 mEq/L — ABNORMAL HIGH (ref 19–32)
CREATININE: 0.71 mg/dL (ref 0.40–1.20)
Calcium: 9.7 mg/dL (ref 8.4–10.5)
Chloride: 102 mEq/L (ref 96–112)
GFR: 91.03 mL/min (ref >60.00)
GLUCOSE: 96 mg/dL (ref 70–99)
Potassium: 3.7 mEq/L (ref 3.5–5.1)
SODIUM: 141 meq/L (ref 135–145)
TOTAL PROTEIN: 7 g/dL (ref 6.0–8.3)

## 2015-03-20 LAB — LIPID PANEL
Cholesterol: 212 mg/dL — ABNORMAL HIGH (ref 0–200)
HDL: 58.2 mg/dL (ref >39.00)
LDL Cholesterol: 130 mg/dL — ABNORMAL HIGH (ref 0–99)
NonHDL: 153.81
TRIGLYCERIDES: 121 mg/dL (ref 0.0–149.0)
Total CHOL/HDL Ratio: 4
VLDL: 24.2 mg/dL (ref 0.0–40.0)

## 2015-03-20 LAB — TSH: TSH: 1.66 u[IU]/mL (ref 0.35–4.50)

## 2015-03-20 LAB — HEMOGLOBIN A1C: Hgb A1c MFr Bld: 5.8 % (ref 4.6–6.5)

## 2015-03-21 ENCOUNTER — Encounter: Payer: 59 | Admitting: Family Medicine

## 2015-03-21 ENCOUNTER — Telehealth: Payer: Self-pay | Admitting: Family Medicine

## 2015-03-21 NOTE — Telephone Encounter (Signed)
Please call pt: - her labs are stable. Her liver enzymes are elevated, but consistent with prior studies. We will review in more detail at her upcoming appt.

## 2015-03-21 NOTE — Telephone Encounter (Signed)
Spoke with patient reviewed lab results. 

## 2015-03-24 ENCOUNTER — Encounter: Payer: Self-pay | Admitting: Family Medicine

## 2015-03-24 ENCOUNTER — Other Ambulatory Visit (HOSPITAL_COMMUNITY)
Admission: RE | Admit: 2015-03-24 | Discharge: 2015-03-24 | Disposition: A | Discharge status: Home / Self Care | Payer: 59 | Source: Ambulatory Visit | Attending: Family Medicine | Admitting: Family Medicine

## 2015-03-24 ENCOUNTER — Ambulatory Visit (INDEPENDENT_AMBULATORY_CARE_PROVIDER_SITE_OTHER): Payer: 59 | Admitting: Family Medicine

## 2015-03-24 VITALS — BP 118/73 | HR 80 | Temp 98.9°F | Resp 18 | Ht 62.5 in | Wt 188.0 lb

## 2015-03-24 DIAGNOSIS — Z23 Encounter for immunization: Secondary | ICD-10-CM | POA: Diagnosis not present

## 2015-03-24 DIAGNOSIS — Z124 Encounter for screening for malignant neoplasm of cervix: Secondary | ICD-10-CM | POA: Diagnosis not present

## 2015-03-24 DIAGNOSIS — Z1159 Encounter for screening for other viral diseases: Secondary | ICD-10-CM

## 2015-03-24 DIAGNOSIS — Z1151 Encounter for screening for human papillomavirus (HPV): Secondary | ICD-10-CM | POA: Diagnosis present

## 2015-03-24 DIAGNOSIS — Z01419 Encounter for gynecological examination (general) (routine) without abnormal findings: Secondary | ICD-10-CM

## 2015-03-24 DIAGNOSIS — Z0000 Encounter for general adult medical examination without abnormal findings: Secondary | ICD-10-CM | POA: Diagnosis not present

## 2015-03-24 DIAGNOSIS — Z114 Encounter for screening for human immunodeficiency virus [HIV]: Secondary | ICD-10-CM

## 2015-03-24 DIAGNOSIS — I10 Essential (primary) hypertension: Secondary | ICD-10-CM

## 2015-03-24 MED ORDER — POTASSIUM CHLORIDE CRYS ER 20 MEQ PO TBCR: 20.0000 meq | EXTENDED_RELEASE_TABLET | Freq: Two times a day (BID) | ORAL | Status: DC

## 2015-03-24 MED ORDER — HYDROCHLOROTHIAZIDE 25 MG PO TABS: 25.0000 mg | ORAL_TABLET | Freq: Every day | ORAL | Status: DC

## 2015-03-24 NOTE — Progress Notes (Signed)
Subjective:    Patient ID: [name], female    DOB: 03/11/1961, 54 y.o.   MRN: [id number]  HPI  Well women exam: pt presents for CPE with PAP today. Pt states she has not had any abnormal PAPs in the past. She has not had a menses for over a year. She endorses a history of fibroids. She also endorses dyspareunia. She uses vaginal lubrication and condoms for protection. She is married and in a monogamous relationship. G2P1, with ectopic, and FT removal. She does endorse hot flashes. She has started exercising a few times a week at the [facility] and is walking on the days she is not going to the gym. She is starting to count calories and watch her intake.   Health maintenance:  Colonoscopy: No family history. Last colonoscopy 2015, follow-up every 5 years or colitis.  Mammogram: No family history. Last mammogram 2013, mammogram scheduled 04/23/2015. Cervical cancer screening: Last Pap 3 years ago, no abnormal Paps.  Immunizations: Unknown immunization record, up-to-date on flu shot. UTD PPSV 23, pnc 13 indicated. Infectious disease screening: Unknown HIV and hepatitis C screening DEXA: Screening to start at age 83  Review of Systems Negative, with the exception of above mentioned in HPI     Objective:   Physical Exam BP 118/73 mmHg  Pulse 80  Temp(Src) 98.9 F (37.2 C) (Temporal)  Resp 18  Ht 5' 2.5" (1.588 m)  Wt 188 lb (85.276 kg)  BMI 33.82 kg/m2  SpO2 95%  LMP 04/03/2013 Gen: Afebrile. No acute distress. Nontoxic in appearance, well developed, well nourished, obese female. Pleasant,talkative.  HENT: AT. [place]. Bilateral TM visualized and normal in appearance. MMM, no oral lesions. Bilateral nares without erythema or swelling. Throat without erythema or exudates. Good dentition. Eyes:Pupils Equal Round Reactive to light, Extraocular movements intact,  Conjunctiva without redness, discharge or icterus. Neck/lymp/endocrine: Supple,No lymphadenopathy, No thyromegaly CV: RRR,  No edema, +2/4 P posterior tibialis pulses Chest: CTAB, no wheeze or crackles. Good air movement, normal resp effort. Abd: Soft. obese. NTND. BS present.  MSK: No erythema, no soft tissue swelling. No obvious deformities. Left knee midline scar. Neurovascularly intact distally.  Skin: No rashes, purpura or petechiae.  Neuro: Normal gait. PERLA. EOMi. Alert. Oriented x3  Psych: Normal affect, dress and demeanor. Normal speech. Normal thought content and judgment.Marland Kitchen   GYN:  External genitalia within normal limits, normal hair distribution, no lesions. Urethral meatus normal, no lesions. Vaginal mucosa pink, moist, normal rugae, no lesions. No cystocele or rectocele. friable cervix with lesion, no discharge,  or bleeding noted on speculum exam, stenotic cervix.  Bimanual exam revealed mildly enlarged uterus .  No bladder/suprapubic fullness, masses or tenderness. No cervical motion tenderness.. Anus and perineum within normal limits, no lesions.     Assessment & Plan:  1. Cervical cancer screening - Cytology - PAP with HPV  2. Encounter for routine gynecological examination > 150 minutes a week of exercise. Congratulated her on starting a gym and walking.  - Continue to follow healthy diet, myfitnesspal recommended.   3. Encounter for preventive health examination - Immunizations updated today - HIV and Hep c collected - PAP completed.  Health maintenance:  Colonoscopy: No family history. Last colonoscopy 2015, follow-up every 5 years or colitis.  Mammogram: No family history. Last mammogram 2013, mammogram scheduled 04/23/2015. Cervical cancer screening: Last Pap 3 years ago, no abnormal Paps. PAP completed today, abnormal exam. Consider [place] if PAP not adequate or abnormal.  Immunizations: UTD flu shot.  UTD PPSV 23, pnc 13 and Tdap administered  Today.   Infectious disease screening: HIV and Hep C collected today. DEXA: Screening to start at age 53 Vit D encouraged, daily 800 u 4.  Immunization due - Pneumococcal conjugate vaccine 13-valent IM - Tdap vaccine greater than or equal to 7yo IM  5. Essential hypertension - stable. Refills provided to pt today - review of labs, low salt diet, exercise  - potassium chloride SA (K-DUR,KLOR-CON) 20 MEQ tablet; Take 1 tablet (20 mEq total) by mouth 2 (two) times daily.  Dispense: 180 tablet; Refill: 2 - hydrochlorothiazide (HYDRODIURIL) 25 MG tablet; Take 1 tablet (25 mg total) by mouth daily.  Dispense: 90 tablet; Refill: 2  - AVS on health maintenance given to pt for educations.   6 months with A1c prior to rooming

## 2015-03-24 NOTE — Patient Instructions (Signed)
Health Maintenance, Female Adopting a healthy lifestyle and getting preventive care can go a long way to promote health and wellness. Talk with your health care provider about what schedule of regular examinations is right for you. This is a good chance for you to check in with your provider about disease prevention and staying healthy. In between checkups, there are plenty of things you can do on your own. Experts have done a lot of research about which lifestyle changes and preventive measures are most likely to keep you healthy. Ask your health care provider for more information. WEIGHT AND DIET  Eat a healthy diet  Be sure to include plenty of vegetables, fruits, low-fat dairy products, and lean protein.  Do not eat a lot of foods high in solid fats, added sugars, or salt.  Get regular exercise. This is one of the most important things you can do for your health.  Most adults should exercise for at least 150 minutes each week. The exercise should increase your heart rate and make you sweat (moderate-intensity exercise).  Most adults should also do strengthening exercises at least twice a week. This is in addition to the moderate-intensity exercise.  Maintain a healthy weight  Body mass index (BMI) is a measurement that can be used to identify possible weight problems. It estimates body fat based on height and weight. Your health care provider can help determine your BMI and help you achieve or maintain a healthy weight.  For females 20 years of age and older:   A BMI below 18.5 is considered underweight.  A BMI of 18.5 to 24.9 is normal.  A BMI of 25 to 29.9 is considered overweight.  A BMI of 30 and above is considered obese.  Watch levels of cholesterol and blood lipids  You should start having your blood tested for lipids and cholesterol at 54 years of age, then have this test every 5 years.  You may need to have your cholesterol levels checked more often if:  Your lipid  or cholesterol levels are high.  You are older than 54 years of age.  You are at high risk for heart disease.  CANCER SCREENING   Lung Cancer  Lung cancer screening is recommended for adults 55-80 years old who are at high risk for lung cancer because of a history of smoking.  A yearly low-dose CT scan of the lungs is recommended for people who:  Currently smoke.  Have quit within the past 15 years.  Have at least a 30-pack-year history of smoking. A pack year is smoking an average of one pack of cigarettes a day for 1 year.  Yearly screening should continue until it has been 15 years since you quit.  Yearly screening should stop if you develop a health problem that would prevent you from having lung cancer treatment.  Breast Cancer  Practice breast self-awareness. This means understanding how your breasts normally appear and feel.  It also means doing regular breast self-exams. Let your health care provider know about any changes, no matter how small.  If you are in your 20s or 30s, you should have a clinical breast exam (CBE) by a health care provider every 1-3 years as part of a regular health exam.  If you are 40 or older, have a CBE every year. Also consider having a breast X-ray (mammogram) every year.  If you have a family history of breast cancer, talk to your health care provider about genetic screening.  If you   are at high risk for breast cancer, talk to your health care provider about having an MRI and a mammogram every year.  Breast cancer gene (BRCA) assessment is recommended for women who have family members with BRCA-related cancers. BRCA-related cancers include:  Breast.  Ovarian.  Tubal.  Peritoneal cancers.  Results of the assessment will determine the need for genetic counseling and BRCA1 and BRCA2 testing. Cervical Cancer Your health care provider may recommend that you be screened regularly for cancer of the pelvic organs (ovaries, uterus, and  vagina). This screening involves a pelvic examination, including checking for microscopic changes to the surface of your cervix (Pap test). You may be encouraged to have this screening done every 3 years, beginning at age 21.  For women ages 30-65, health care providers may recommend pelvic exams and Pap testing every 3 years, or they may recommend the Pap and pelvic exam, combined with testing for human papilloma virus (HPV), every 5 years. Some types of HPV increase your risk of cervical cancer. Testing for HPV may also be done on women of any age with unclear Pap test results.  Other health care providers may not recommend any screening for nonpregnant women who are considered low risk for pelvic cancer and who do not have symptoms. Ask your health care provider if a screening pelvic exam is right for you.  If you have had past treatment for cervical cancer or a condition that could lead to cancer, you need Pap tests and screening for cancer for at least 20 years after your treatment. If Pap tests have been discontinued, your risk factors (such as having a new sexual partner) need to be reassessed to determine if screening should resume. Some women have medical problems that increase the chance of getting cervical cancer. In these cases, your health care provider may recommend more frequent screening and Pap tests. Colorectal Cancer  This type of cancer can be detected and often prevented.  Routine colorectal cancer screening usually begins at 54 years of age and continues through 54 years of age.  Your health care provider may recommend screening at an earlier age if you have risk factors for colon cancer.  Your health care provider may also recommend using home test kits to check for hidden blood in the stool.  A small camera at the end of a tube can be used to examine your colon directly (sigmoidoscopy or colonoscopy). This is done to check for the earliest forms of colorectal  cancer.  Routine screening usually begins at age 50.  Direct examination of the colon should be repeated every 5-10 years through 54 years of age. However, you may need to be screened more often if early forms of precancerous polyps or small growths are found. Skin Cancer  Check your skin from head to toe regularly.  Tell your health care provider about any new moles or changes in moles, especially if there is a change in a mole's shape or color.  Also tell your health care provider if you have a mole that is larger than the size of a pencil eraser.  Always use sunscreen. Apply sunscreen liberally and repeatedly throughout the day.  Protect yourself by wearing long sleeves, pants, a wide-brimmed hat, and sunglasses whenever you are outside. HEART DISEASE, DIABETES, AND HIGH BLOOD PRESSURE   High blood pressure causes heart disease and increases the risk of stroke. High blood pressure is more likely to develop in:  People who have blood pressure in the high end   of the normal range (130-139/85-89 mm Hg).  People who are overweight or obese.  People who are African American.  If you are 38-23 years of age, have your blood pressure checked every 3-5 years. If you are 61 years of age or older, have your blood pressure checked every year. You should have your blood pressure measured twice--once when you are at a hospital or clinic, and once when you are not at a hospital or clinic. Record the average of the two measurements. To check your blood pressure when you are not at a hospital or clinic, you can use:  An automated blood pressure machine at a pharmacy.  A home blood pressure monitor.  If you are between 45 years and 39 years old, ask your health care provider if you should take aspirin to prevent strokes.  Have regular diabetes screenings. This involves taking a blood sample to check your fasting blood sugar level.  If you are at a normal weight and have a low risk for diabetes,  have this test once every three years after 54 years of age.  If you are overweight and have a high risk for diabetes, consider being tested at a younger age or more often. PREVENTING INFECTION  Hepatitis B  If you have a higher risk for hepatitis B, you should be screened for this virus. You are considered at high risk for hepatitis B if:  You were born in a country where hepatitis B is common. Ask your health care provider which countries are considered high risk.  Your parents were born in a high-risk country, and you have not been immunized against hepatitis B (hepatitis B vaccine).  You have HIV or AIDS.  You use needles to inject street drugs.  You live with someone who has hepatitis B.  You have had sex with someone who has hepatitis B.  You get hemodialysis treatment.  You take certain medicines for conditions, including cancer, organ transplantation, and autoimmune conditions. Hepatitis C  Blood testing is recommended for:  Everyone born from [birth year] through 1965.  Anyone with known risk factors for hepatitis C. Sexually transmitted infections (STIs)  You should be screened for sexually transmitted infections (STIs) including gonorrhea and chlamydia if:  You are sexually active and are younger than 54 years of age.  You are older than 53 years of age and your health care provider tells you that you are at risk for this type of infection.  Your sexual activity has changed since you were last screened and you are at an increased risk for chlamydia or gonorrhea. Ask your health care provider if you are at risk.  If you do not have HIV, but are at risk, it may be recommended that you take a prescription medicine daily to prevent HIV infection. This is called pre-exposure prophylaxis (PrEP). You are considered at risk if:  You are sexually active and do not regularly use condoms or know the HIV status of your partner(s).  You take drugs by injection.  You are sexually  active with a partner who has HIV. Talk with your health care provider about whether you are at high risk of being infected with HIV. If you choose to begin PrEP, you should first be tested for HIV. You should then be tested every 3 months for as long as you are taking PrEP.  PREGNANCY   If you are premenopausal and you may become pregnant, ask your health care provider about preconception counseling.  If you may  become pregnant, take 400 to 800 micrograms (mcg) of folic acid every day.  If you want to prevent pregnancy, talk to your health care provider about birth control (contraception). OSTEOPOROSIS AND MENOPAUSE   Osteoporosis is a disease in which the bones lose minerals and strength with aging. This can result in serious bone fractures. Your risk for osteoporosis can be identified using a bone density scan.  If you are 27 years of age or older, or if you are at risk for osteoporosis and fractures, ask your health care provider if you should be screened.  Ask your health care provider whether you should take a calcium or vitamin D supplement to lower your risk for osteoporosis.  Menopause may have certain physical symptoms and risks.  Hormone replacement therapy may reduce some of these symptoms and risks. Talk to your health care provider about whether hormone replacement therapy is right for you.  HOME CARE INSTRUCTIONS   Schedule regular health, dental, and eye exams.  Stay current with your immunizations.   Do not use any tobacco products including cigarettes, chewing tobacco, or electronic cigarettes.  If you are pregnant, do not drink alcohol.  If you are breastfeeding, limit how much and how often you drink alcohol.  Limit alcohol intake to no more than 1 drink per day for nonpregnant women. One drink equals 12 ounces of beer, 5 ounces of wine, or 1 ounces of hard liquor.  Do not use street drugs.  Do not share needles.  Ask your health care provider for help if  you need support or information about quitting drugs.  Tell your health care provider if you often feel depressed.  Tell your health care provider if you have ever been abused or do not feel safe at home.   This information is not intended to replace advice given to you by your health care provider. Make sure you discuss any questions you have with your health care provider.   Document Released: 10/05/2010 Document Revised: 04/12/2014 Document Reviewed: 02/21/2013 Elsevier Interactive Patient Education 2016 [employer].  Make certain to take at least 800 units of Vitamin D daily.  Continue the good work with exercise.  Use myfitnnesspal app to help with weight management.  Exercise at least 150 minutes a day, increase your water intake to at least 60-70 ounces a day, more if exercising.

## 2015-03-24 NOTE — Progress Notes (Signed)
Pre visit review using our clinic review tool, if applicable. No additional management support is needed unless otherwise documented below in the visit note. 

## 2015-03-25 ENCOUNTER — Telehealth: Payer: Self-pay | Admitting: Family Medicine

## 2015-03-25 LAB — HEPATITIS C ANTIBODY: HCV AB: NEGATIVE

## 2015-03-25 LAB — HIV ANTIBODY (ROUTINE TESTING W REFLEX): HIV 1&2 Ab, 4th Generation: NONREACTIVE

## 2015-03-27 LAB — CYTOLOGY - PAP

## 2015-03-27 NOTE — Telephone Encounter (Addendum)
Pleas call pt: - PAP results are neg for malignancy and HPV. Next screen 3 years. (If she asks we did get the transformation zone I was concerned about not getting). - Her Hep c and HIV screenings are negative.

## 2015-03-27 NOTE — Telephone Encounter (Signed)
LMOM for pt to CB for results. 

## 2015-03-28 NOTE — Telephone Encounter (Signed)
Patient aware of results.  Pt had no questions at this time.  

## 2015-04-08 DIAGNOSIS — F332 Major depressive disorder, recurrent severe without psychotic features: Secondary | ICD-10-CM | POA: Diagnosis not present

## 2015-04-08 MED FILL — TRINTELLIX 10 MG TABLET: 10 | 30 days supply | Qty: 30 | Fill #2

## 2015-04-22 DIAGNOSIS — F332 Major depressive disorder, recurrent severe without psychotic features: Secondary | ICD-10-CM | POA: Diagnosis not present

## 2015-04-23 ENCOUNTER — Ambulatory Visit
Admission: RE | Admit: 2015-04-23 | Discharge: 2015-04-23 | Disposition: A | Discharge status: Home / Self Care | Payer: 59 | Source: Ambulatory Visit | Attending: Family Medicine | Admitting: Family Medicine

## 2015-04-23 DIAGNOSIS — Z1231 Encounter for screening mammogram for malignant neoplasm of breast: Secondary | ICD-10-CM | POA: Diagnosis not present

## 2015-04-23 DIAGNOSIS — Z1239 Encounter for other screening for malignant neoplasm of breast: Secondary | ICD-10-CM

## 2015-04-24 MED FILL — OLANZAPINE-FLUOXETINE 6-25: 6-25 | 30 days supply | Qty: 30 | Fill #3

## 2015-04-24 MED FILL — SYMBICORT 160-4.5 MCG INH: 160-4.5 | 30 days supply | Qty: 10 | Fill #0

## 2015-05-02 DIAGNOSIS — F410 Panic disorder [episodic paroxysmal anxiety] without agoraphobia: Secondary | ICD-10-CM | POA: Diagnosis not present

## 2015-05-02 MED FILL — RESTASIS 0.05% EYE EMULSION: 0.05 | 45 days supply | Qty: 90 | Fill #0

## 2015-05-02 MED FILL — PHENTERMINE 37.5 MG TABLET: 37.5 | 90 days supply | Qty: 90 | Fill #0

## 2015-05-07 DIAGNOSIS — F332 Major depressive disorder, recurrent severe without psychotic features: Secondary | ICD-10-CM | POA: Diagnosis not present

## 2015-05-16 ENCOUNTER — Ambulatory Visit (INDEPENDENT_AMBULATORY_CARE_PROVIDER_SITE_OTHER): Payer: 59 | Admitting: Family Medicine

## 2015-05-16 ENCOUNTER — Encounter: Payer: Self-pay | Admitting: Family Medicine

## 2015-05-16 VITALS — BP 127/80 | HR 63 | Temp 97.8°F | Resp 20 | Wt 191.0 lb

## 2015-05-16 DIAGNOSIS — R319 Hematuria, unspecified: Secondary | ICD-10-CM

## 2015-05-16 DIAGNOSIS — R8299 Other abnormal findings in urine: Secondary | ICD-10-CM | POA: Diagnosis not present

## 2015-05-16 DIAGNOSIS — R300 Dysuria: Secondary | ICD-10-CM

## 2015-05-16 DIAGNOSIS — N390 Urinary tract infection, site not specified: Secondary | ICD-10-CM | POA: Diagnosis not present

## 2015-05-16 DIAGNOSIS — R8290 Unspecified abnormal findings in urine: Secondary | ICD-10-CM | POA: Diagnosis not present

## 2015-05-16 LAB — POC URINALSYSI DIPSTICK (AUTOMATED)
Bilirubin, UA: NEGATIVE
GLUCOSE UA: NEGATIVE
Ketones, UA: NEGATIVE
NITRITE UA: POSITIVE
Protein, UA: NEGATIVE
Spec Grav, UA: 1.01
UROBILINOGEN UA: 0.2
pH, UA: 6

## 2015-05-16 MED ORDER — CEPHALEXIN 500 MG PO CAPS: 500.0000 mg | ORAL_CAPSULE | Freq: Four times a day (QID) | ORAL | Status: DC

## 2015-05-16 MED FILL — CEPHALEXIN 500 MG CAPSULE: 500 | 7 days supply | Qty: 28 | Fill #0

## 2015-05-16 NOTE — Patient Instructions (Signed)
Daily caloric intake calculator (google this, and use to calculate) this is a moving target as your weight changes.  You do have UTI today, I have called in keflex for you to take, this usually is most sensitive.   Urinary Tract Infection Urinary tract infections (UTIs) can develop anywhere along your urinary tract. Your urinary tract is your body's drainage system for removing wastes and extra water. Your urinary tract includes two kidneys, two ureters, a bladder, and a urethra. Your kidneys are a pair of bean-shaped organs. Each kidney is about the size of your fist. They are located below your ribs, one on each side of your spine. CAUSES Infections are caused by microbes, which are microscopic organisms, including fungi, viruses, and bacteria. These organisms are so small that they can only be seen through a microscope. Bacteria are the microbes that most commonly cause UTIs. SYMPTOMS  Symptoms of UTIs may vary by age and gender of the patient and by the location of the infection. Symptoms in young women typically include a frequent and intense urge to urinate and a painful, burning feeling in the bladder or urethra during urination. Older women and men are more likely to be tired, shaky, and weak and have muscle aches and abdominal pain. A fever may mean the infection is in your kidneys. Other symptoms of a kidney infection include pain in your back or sides below the ribs, nausea, and vomiting. DIAGNOSIS To diagnose a UTI, your caregiver will ask you about your symptoms. Your caregiver will also ask you to provide a urine sample. The urine sample will be tested for bacteria and white blood cells. White blood cells are made by your body to help fight infection. TREATMENT  Typically, UTIs can be treated with medication. Because most UTIs are caused by a bacterial infection, they usually can be treated with the use of antibiotics. The choice of antibiotic and length of treatment depend on your symptoms  and the type of bacteria causing your infection. HOME CARE INSTRUCTIONS  If you were prescribed antibiotics, take them exactly as your caregiver instructs you. Finish the medication even if you feel better after you have only taken some of the medication.  Drink enough water and fluids to keep your urine clear or pale yellow.  Avoid caffeine, tea, and carbonated beverages. They tend to irritate your bladder.  Empty your bladder often. Avoid holding urine for long periods of time.  Empty your bladder before and after sexual intercourse.  After a bowel movement, women should cleanse from front to back. Use each tissue only once. SEEK MEDICAL CARE IF:   You have back pain.  You develop a fever.  Your symptoms do not begin to resolve within 3 days. SEEK IMMEDIATE MEDICAL CARE IF:   You have severe back pain or lower abdominal pain.  You develop chills.  You have nausea or vomiting.  You have continued burning or discomfort with urination. MAKE SURE YOU:   Understand these instructions.  Will watch your condition.  Will get help right away if you are not doing well or get worse.   This information is not intended to replace advice given to you by your health care provider. Make sure you discuss any questions you have with your health care provider.   Document Released: 12/30/2004 Document Revised: 12/11/2014 Document Reviewed: 04/30/2011 Elsevier Interactive Patient Education [employer].

## 2015-05-16 NOTE — Progress Notes (Signed)
Patient ID: [name], female   DOB: 02/15/1961, 55 y.o.   MRN: [id number]    [name] , [birth year], 55 y.o., female MRN: [id number]  CC: Dysuria Subjective: Pt presents for an acute OV with complaints of dysuia of 1 week duration. Associated symptoms include suprapubic pressure, urinary frequency. Pt has tried AZO to ease their symptoms. She has a history of RCC, with partial nephrectomy. She denies any fever, chills, nausea, vomit, abdominal pain, back pain or vaginal irritation.   Allergies  Allergen Reactions  . Codeine Itching   Social History  Substance Use Topics  . Smoking status: Never Smoker   . Smokeless tobacco: Never Used  . Alcohol Use: No   Past Medical History  Diagnosis Date  . Anxiety     psychiatrist: Dr. Robina Ade   . Depression     psychiatrist: Dr. Robina Ade  . Renal cell carcinoma     hx of left and right; Dr. Nena Alexander  . Colitis   . Fatty liver   . Elevated liver enzymes     Dr. Rulon Abide following  . Arthritis   . History of chicken pox   . Colon polyps   . GERD (gastroesophageal reflux disease)   . Heart murmur    Past Surgical History  Procedure Laterality Date  . Cholecystectomy    . Tonsillectomy    . Nephrectomy      partial LT  . Fetal radio frequency ablation    . Fracture surgery Right     ankle  . Joint replacement  2009    knee   Family History  Problem Relation Age of Onset  . Hypertension Mother   . Alzheimer's disease Mother   . Arthritis Mother   . Hypertension Father   . Alzheimer's disease Father   . COPD Father   . Heart disease Father   . Arthritis Father   . Hypertension Sister      Medication List       This list is accurate as of: 05/16/15 11:24 AM.  Always use your most recent med list.               albuterol (2.5 MG/3ML) 0.083% nebulizer solution  Commonly known as:  PROVENTIL  Take 3 mLs (2.5 mg total) by nebulization every 6 (six) hours as needed for wheezing or shortness of breath.     albuterol 108 (90 Base) MCG/ACT inhaler  Commonly known as:  VENTOLIN HFA  Inhale 2 puffs into the lungs every 6 (six) hours as needed for wheezing or shortness of breath.     ALPRAZolam 0.5 MG 24 hr tablet  Commonly known as:  XANAX XR  Take 0.5 mg by mouth daily. 2 tabs q AM     amphetamine-dextroamphetamine 20 MG tablet  Commonly known as:  ADDERALL  Take 20 mg by mouth 3 (three) times daily.     BRINTELLIX 10 MG Tabs  Generic drug:  Vortioxetine HBr  Take 1 tablet by mouth daily.     budesonide-formoterol 160-4.5 MCG/ACT inhaler  Commonly known as:  SYMBICORT  Inhale 2 puffs into the lungs 2 (two) times daily. Rinse after each use     hydrochlorothiazide 25 MG tablet  Commonly known as:  HYDRODIURIL  Take 1 tablet (25 mg total) by mouth daily.     NEXIUM 40 MG capsule  Generic drug:  esomeprazole  TAKE 1 CAPSULE BY MOUTH ONCE DAILY BEFORE BREAKFAST     OLANZapine-FLUoxetine 6-25 MG capsule  Commonly known as:  SYMBYAX  Take 1 capsule by mouth at bedtime.     potassium chloride SA 20 MEQ tablet  Commonly known as:  K-DUR,KLOR-CON  Take 1 tablet (20 mEq total) by mouth 2 (two) times daily.     RESTASIS 0.05 % ophthalmic emulsion  Generic drug:  cycloSPORINE         ROS: Negative, with the exception of above mentioned in HPI   Objective:  BP 127/80 mmHg  Pulse 63  Temp(Src) 97.8 F (36.6 C) (Oral)  Resp 20  Wt 191 lb (86.637 kg)  SpO2 95%  LMP 04/03/2013 Body mass index is 34.36 kg/(m^2). Gen: Afebrile. No acute distress. Nontoxic in appearance. Well developed well nourished.  HENT: AT. [place]. Bilateral TM visualized and normal in appearance. MMM, no oral lesions.  Eyes:Pupils Equal Round Reactive to light, Extraocular movements intact,  Conjunctiva without redness, discharge or icterus. CV: RRR no murmur Chest: CTAB, no wheeze or crackles. Good air movement, normal resp effort.  Abd: Soft. Obese,  ND. Suprapubic pressure to palpation. BS present. No  guarding or rebound tenderness.  MSK: No CVA tenderness bilateral Neuro: Normal gait. PERLA. EOMi. Alert. Oriented x3   Assessment/Plan: [name] is a 55 y.o. female present for acute OV for  Dysuria/abnormal urine - POCT Urinalysis Dipstick (Automated): +leuks, nitrite and blood - Urine Culture Urinary tract infection with hematuria, site unspecified - hydrate - cephALEXin (KEFLEX) 500 MG capsule; Take 1 capsule (500 mg total) by mouth 4 (four) times daily.  Dispense: 28 capsule; Refill: 0   > 25 minutes spent with patient, >50% of time spent face to face counseling patient and coordinating care.  Renee Raoul Pitch, DO  [place]

## 2015-05-19 ENCOUNTER — Telehealth: Payer: Self-pay | Admitting: Family Medicine

## 2015-05-19 LAB — URINE CULTURE: Colony Count: >=100000

## 2015-05-19 NOTE — Telephone Encounter (Signed)
Spoke with patient reviewed culture results . 

## 2015-05-19 NOTE — Telephone Encounter (Signed)
Please call pt: - urine culture with E. Coli growth--> sensitive to abx we started. Continie abx to completion.

## 2015-05-20 DIAGNOSIS — F332 Major depressive disorder, recurrent severe without psychotic features: Secondary | ICD-10-CM | POA: Diagnosis not present

## 2015-05-22 MED FILL — VENTOLIN HFA 90 MCG INHALER: 108 (90 BAS | 25 days supply | Qty: 18 | Fill #1

## 2015-05-22 MED FILL — KLOR-CON M20 TABLET: 20 | 90 days supply | Qty: 180 | Fill #0

## 2015-05-22 MED FILL — SYMBICORT 160-4.5 MCG INH: 160-4.5 | 30 days supply | Qty: 10 | Fill #1

## 2015-05-22 MED FILL — HYDROCHLOROTHIAZIDE 25 MG T: 25 | 90 days supply | Qty: 90 | Fill #0

## 2015-05-22 MED FILL — OLANZAPINE-FLUOXETINE 6-25: 6-25 | 30 days supply | Qty: 30 | Fill #4

## 2015-05-23 MED FILL — AMPHETAMINE SALTS 20 MG TAB: 20 | 30 days supply | Qty: 90 | Fill #0

## 2015-06-03 DIAGNOSIS — F332 Major depressive disorder, recurrent severe without psychotic features: Secondary | ICD-10-CM | POA: Diagnosis not present

## 2015-06-10 DIAGNOSIS — R748 Abnormal levels of other serum enzymes: Secondary | ICD-10-CM | POA: Diagnosis not present

## 2015-06-10 DIAGNOSIS — Z8719 Personal history of other diseases of the digestive system: Secondary | ICD-10-CM | POA: Diagnosis not present

## 2015-06-10 DIAGNOSIS — K760 Fatty (change of) liver, not elsewhere classified: Secondary | ICD-10-CM | POA: Diagnosis not present

## 2015-06-10 DIAGNOSIS — K219 Gastro-esophageal reflux disease without esophagitis: Secondary | ICD-10-CM | POA: Diagnosis not present

## 2015-06-11 DIAGNOSIS — F900 Attention-deficit hyperactivity disorder, predominantly inattentive type: Secondary | ICD-10-CM | POA: Diagnosis not present

## 2015-06-11 DIAGNOSIS — F410 Panic disorder [episodic paroxysmal anxiety] without agoraphobia: Secondary | ICD-10-CM | POA: Diagnosis not present

## 2015-06-11 DIAGNOSIS — F411 Generalized anxiety disorder: Secondary | ICD-10-CM | POA: Diagnosis not present

## 2015-06-11 DIAGNOSIS — F341 Dysthymic disorder: Secondary | ICD-10-CM | POA: Diagnosis not present

## 2015-06-13 MED FILL — DEXTROAMP-AMP 30 MG TABLET: 30 | 30 days supply | Qty: 30 | Fill #0

## 2015-06-13 MED FILL — DEXTROAMP-AMPHET ER 30 MG C: 30 | 30 days supply | Qty: 30 | Fill #0

## 2015-06-19 MED FILL — OLANZAPINE-FLUOXETINE 3-25: 3-25 | 30 days supply | Qty: 30 | Fill #1

## 2015-06-21 ENCOUNTER — Encounter: Payer: Self-pay | Admitting: Emergency Medicine

## 2015-06-21 ENCOUNTER — Emergency Department (INDEPENDENT_AMBULATORY_CARE_PROVIDER_SITE_OTHER)
Admission: EM | Admit: 2015-06-21 | Discharge: 2015-06-21 | Disposition: A | Discharge status: Home / Self Care | Payer: 59 | Source: Home / Self Care | Attending: Family Medicine | Admitting: Family Medicine

## 2015-06-21 DIAGNOSIS — J101 Influenza due to other identified influenza virus with other respiratory manifestations: Secondary | ICD-10-CM

## 2015-06-21 LAB — POCT INFLUENZA A/B
INFLUENZA B, POC: NEGATIVE
Influenza A, POC: POSITIVE — AB

## 2015-06-21 MED ORDER — PREDNISONE 50 MG PO TABS: ORAL_TABLET | ORAL | Status: DC

## 2015-06-21 MED ORDER — METHYLPREDNISOLONE SODIUM SUCC 125 MG IJ SOLR
125.0000 mg | Freq: Once | INTRAMUSCULAR | Status: AC
  Administered 2015-06-21: 125 mg via INTRAMUSCULAR

## 2015-06-21 MED ORDER — OSELTAMIVIR PHOSPHATE 75 MG PO CAPS: 75.0000 mg | ORAL_CAPSULE | Freq: Two times a day (BID) | ORAL | Status: DC

## 2015-06-21 NOTE — Discharge Instructions (Signed)
Begin prednisone Sunday, June 22, 2015. Take plain guaifenesin (1200mg  extended release tabs such as Mucinex) twice daily, with plenty of water, for cough and congestion.  May add Pseudoephedrine (30mg , one or two every 4 to 6 hours) for sinus congestion.  Get adequate rest.   May use Afrin nasal spray (or generic oxymetazoline) twice daily for about 5 days and then discontinue.  Also recommend using saline nasal spray several times daily and saline nasal irrigation (AYR is a common brand).  Use Flonase nasal spray each morning after using Afrin nasal spray and saline nasal irrigation. Try warm salt water gargles for sore throat. Continue Symbicort.  Continue albuterol by nebulizer as needed. May take Delsym Cough Suppressant at bedtime for nighttime cough.   Stop all antihistamines for now, and other non-prescription cough/cold preparations. May take Tylenol as needed for pain, fever, etc.   Follow-up with family doctor if not improving about 6 days.    Influenza, Adult Influenza ("the flu") is a viral infection of the respiratory tract. It occurs more often in winter months because people spend more time in close contact with one another. Influenza can make you feel very sick. Influenza easily spreads from person to person (contagious). CAUSES  Influenza is caused by a virus that infects the respiratory tract. You can catch the virus by breathing in droplets from an infected person's cough or sneeze. You can also catch the virus by touching something that was recently contaminated with the virus and then touching your mouth, nose, or eyes. RISKS AND COMPLICATIONS You may be at risk for a more severe case of influenza if you smoke cigarettes, have diabetes, have chronic heart disease (such as heart failure) or lung disease (such as asthma), or if you have a weakened immune system. Elderly people and pregnant women are also at risk for more serious infections. The most common problem of influenza  is a lung infection (pneumonia). Sometimes, this problem can require emergency medical care and may be life threatening. SIGNS AND SYMPTOMS  Symptoms typically last 4 to 10 days and may include:  Fever.  Chills.  Headache, body aches, and muscle aches.  Sore throat.  Chest discomfort and cough.  Poor appetite.  Weakness or feeling tired.  Dizziness.  Nausea or vomiting. DIAGNOSIS  Diagnosis of influenza is often made based on your history and a physical exam. A nose or throat swab test can be done to confirm the diagnosis. TREATMENT  In mild cases, influenza goes away on its own. Treatment is directed at relieving symptoms. For more severe cases, your health care provider may prescribe antiviral medicines to shorten the sickness. Antibiotic medicines are not effective because the infection is caused by a virus, not by bacteria. HOME CARE INSTRUCTIONS  Take medicines only as directed by your health care provider.  Use a cool mist humidifier to make breathing easier.  Get plenty of rest until your temperature returns to normal. This usually takes 3 to 4 days.  Drink enough fluid to keep your urine clear or pale yellow.  Cover yourmouth and nosewhen coughing or sneezing,and wash your handswellto prevent thevirusfrom spreading.  Stay homefromwork orschool untilthe fever is gonefor at least 61full day. PREVENTION  An annual influenza vaccination (flu shot) is the best way to avoid getting influenza. An annual flu shot is now routinely recommended for all adults in the [place] IF:  You experiencechest pain, yourcough worsens,or you producemore mucus.  Youhave nausea,vomiting, ordiarrhea.  Your fever returns or  gets worse. SEEK IMMEDIATE MEDICAL CARE IF:  You havetrouble breathing, you become short of breath,or your skin ornails becomebluish.  You have severe painor stiffnessin the neck.  You develop a sudden headache, or pain in  the face or ear.  You have nausea or vomiting that you cannot control. MAKE SURE YOU:   Understand these instructions.  Will watch your condition.  Will get help right away if you are not doing well or get worse.   This information is not intended to replace advice given to you by your health care provider. Make sure you discuss any questions you have with your health care provider.   Document Released: 03/19/2000 Document Revised: 04/12/2014 Document Reviewed: 06/21/2011 Elsevier Interactive Patient Education [employer].

## 2015-06-21 NOTE — ED Notes (Signed)
Pt c/o fever, body aches and cough that started yesterday.

## 2015-06-21 NOTE — ED Provider Notes (Signed)
CSN: [id number]     Arrival date & time 06/21/15  1722 History   First MD Initiated Contact with Patient 06/21/15 1820     Chief Complaint  Patient presents with  . Generalized Body Aches      HPI Comments: Yesterday patient developed flu-like illness including myalgias, headache, fever/chills, fatigue, and cough.  Also has mild nasal congestion and sore throat.  Cough is non-productive and somewhat worse at night.  No pleuritic pain or shortness of breath, but she has been wheezing.  She has a history of bronchitis.  She notes that she has a family history of asthma (her father), and her respiratory illnesses are often prolonged.  The history is provided by the patient.    Past Medical History  Diagnosis Date  . Anxiety     psychiatrist: Dr. Robina Ade   . Depression     psychiatrist: Dr. Robina Ade  . Renal cell carcinoma     hx of left and right; Dr. Nena Alexander  . Colitis   . Fatty liver   . Elevated liver enzymes     Dr. Rulon Abide following  . Arthritis   . History of chicken pox   . Colon polyps   . GERD (gastroesophageal reflux disease)   . Heart murmur    Past Surgical History  Procedure Laterality Date  . Cholecystectomy    . Tonsillectomy    . Nephrectomy      partial LT  . Fetal radio frequency ablation    . Fracture surgery Right     ankle  . Joint replacement  2009    knee   Family History  Problem Relation Age of Onset  . Hypertension Mother   . Alzheimer's disease Mother   . Arthritis Mother   . Hypertension Father   . Alzheimer's disease Father   . COPD Father   . Heart disease Father   . Arthritis Father   . Hypertension Sister    Social History  Substance Use Topics  . Smoking status: Never Smoker   . Smokeless tobacco: Never Used  . Alcohol Use: No   OB History    Gravida Para Term Preterm AB TAB SAB Ectopic Multiple Living   2 1   1   1  1      Review of Systems + sore throat + cough + sneezing No pleuritic pain + wheezing + nasal congestion +  post-nasal drainage No sinus pain/pressure No itchy/red eyes ? Earache + dizzy No hemoptysis No SOB + fever, + chills No nausea No vomiting No abdominal pain No diarrhea No urinary symptoms No skin rash + fatigue + myalgias + headache Used OTC meds without relief  Allergies  Codeine  Home Medications   Prior to Admission medications   Medication Sig Start Date End Date Taking? Authorizing Provider  albuterol (PROVENTIL) (2.5 MG/3ML) 0.083% nebulizer solution Take 3 mLs (2.5 mg total) by nebulization every 6 (six) hours as needed for wheezing or shortness of breath. 08/07/13   Deneise Lever, MD  albuterol (VENTOLIN HFA) 108 (90 BASE) MCG/ACT inhaler Inhale 2 puffs into the lungs every 6 (six) hours as needed for wheezing or shortness of breath. 03/07/15   Deneise Lever, MD  ALPRAZolam (XANAX XR) 0.5 MG 24 hr tablet Take 0.5 mg by mouth daily. 2 tabs q AM    Historical Provider, MD  amphetamine-dextroamphetamine (ADDERALL) 20 MG tablet Take 20 mg by mouth 3 (three) times daily.    Historical Provider, MD  budesonide-formoterol (  SYMBICORT) 160-4.5 MCG/ACT inhaler Inhale 2 puffs into the lungs 2 (two) times daily. Rinse after each use 03/07/15   Deneise Lever, MD  cephALEXin (KEFLEX) 500 MG capsule Take 1 capsule (500 mg total) by mouth 4 (four) times daily. 05/16/15   Renee A Kuneff, DO  hydrochlorothiazide (HYDRODIURIL) 25 MG tablet Take 1 tablet (25 mg total) by mouth daily. 03/24/15   Renee A Kuneff, DO  NEXIUM 40 MG capsule TAKE 1 CAPSULE BY MOUTH ONCE DAILY BEFORE BREAKFAST 01/07/15   Deneise Lever, MD  OLANZapine-FLUoxetine (SYMBYAX) 6-25 MG capsule Take 1 capsule by mouth at bedtime.    Historical Provider, MD  oseltamivir (TAMIFLU) 75 MG capsule Take 1 capsule (75 mg total) by mouth every 12 (twelve) hours. 06/21/15   Kandra Nicolas, MD  potassium chloride SA (K-DUR,KLOR-CON) 20 MEQ tablet Take 1 tablet (20 mEq total) by mouth 2 (two) times daily. 03/24/15   Renee A Kuneff,  DO  predniSONE (DELTASONE) 50 MG tablet Take one tab by mouth with food once daily for five days 06/21/15   Kandra Nicolas, MD  RESTASIS 0.05 % ophthalmic emulsion  05/02/15   Historical Provider, MD  Vortioxetine HBr (BRINTELLIX) 10 MG TABS Take 1 tablet by mouth daily.    Historical Provider, MD   Meds Ordered and Administered this Visit   Medications  methylPREDNISolone sodium succinate (SOLU-MEDROL) 125 mg/2 mL injection 125 mg (125 mg Intramuscular Given 06/21/15 1910)    BP 124/83 mmHg  Pulse 112  Temp(Src) 99.8 F (37.7 C) (Oral)  SpO2 95%  LMP 04/03/2013 No data found.   Physical Exam Nursing notes and Vital Signs reviewed. Appearance:  Patient appears stated age, and in no acute distress Eyes:  Pupils are equal, round, and reactive to light and accomodation.  Extraocular movement is intact.  Conjunctivae are not inflamed  Ears:  Canals normal.  Tympanic membranes normal.  Nose:  Mildly congested turbinates.  No sinus tenderness.    Pharynx:  Normal Neck:  Supple.  Tender enlarged posterior nodes are palpated bilaterally  Lungs:  Clear to auscultation.  Breath sounds are equal.  Moving air well. Heart:  Regular rate and rhythm without murmurs, rubs, or gallops.  Abdomen:  Nontender without masses or hepatosplenomegaly.  Bowel sounds are present.  No CVA or flank tenderness.  Extremities:  No edema.  Skin:  No rash present.   ED Course  Procedures none    Labs Reviewed  POCT INFLUENZA A/B - Abnormal; Notable for the following:    Influenza A, POC Positive (*)    All other components within normal limits      MDM   1. Influenza A    Begin Tamiflu. Because of her history of reactive airways disease, administered Solumedrol 125mg  IM Begin prednisone burst Sunday, June 22, 2015. Take plain guaifenesin (1200mg  extended release tabs such as Mucinex) twice daily, with plenty of water, for cough and congestion.  May add Pseudoephedrine (30mg , one or two every 4 to 6  hours) for sinus congestion.  Get adequate rest.   May use Afrin nasal spray (or generic oxymetazoline) twice daily for about 5 days and then discontinue.  Also recommend using saline nasal spray several times daily and saline nasal irrigation (AYR is a common brand).  Use Flonase nasal spray each morning after using Afrin nasal spray and saline nasal irrigation. Try warm salt water gargles for sore throat. Continue Symbicort.  Continue albuterol by nebulizer as needed. May take Delsym Cough  Suppressant at bedtime for nighttime cough.   Stop all antihistamines for now, and other non-prescription cough/cold preparations. May take Tylenol as needed for pain, fever, etc.   Follow-up with family doctor if not improving about 6 days.    Kandra Nicolas, MD 06/24/15 2140

## 2015-06-26 ENCOUNTER — Telehealth: Payer: Self-pay | Admitting: Family Medicine

## 2015-06-26 NOTE — Telephone Encounter (Signed)
error 

## 2015-06-27 ENCOUNTER — Encounter: Payer: Self-pay | Admitting: Family Medicine

## 2015-06-27 ENCOUNTER — Ambulatory Visit (INDEPENDENT_AMBULATORY_CARE_PROVIDER_SITE_OTHER): Payer: 59 | Admitting: Family Medicine

## 2015-06-27 ENCOUNTER — Telehealth: Payer: Self-pay | Admitting: Family Medicine

## 2015-06-27 ENCOUNTER — Ambulatory Visit (INDEPENDENT_AMBULATORY_CARE_PROVIDER_SITE_OTHER)
Admission: RE | Admit: 2015-06-27 | Discharge: 2015-06-27 | Disposition: A | Discharge status: Home / Self Care | Payer: 59 | Source: Ambulatory Visit | Attending: Family Medicine | Admitting: Family Medicine

## 2015-06-27 VITALS — BP 128/67 | HR 76 | Temp 98.6°F | Resp 20 | Wt 189.8 lb

## 2015-06-27 DIAGNOSIS — J209 Acute bronchitis, unspecified: Secondary | ICD-10-CM

## 2015-06-27 DIAGNOSIS — R05 Cough: Secondary | ICD-10-CM | POA: Diagnosis not present

## 2015-06-27 MED ORDER — IPRATROPIUM-ALBUTEROL 0.5-2.5 (3) MG/3ML IN SOLN
3.0000 mL | Freq: Once | RESPIRATORY_TRACT | Status: AC
  Administered 2015-06-27: 3 mL via RESPIRATORY_TRACT

## 2015-06-27 MED ORDER — DOXYCYCLINE HYCLATE 100 MG PO TABS: 100.0000 mg | ORAL_TABLET | Freq: Two times a day (BID) | ORAL | Status: DC

## 2015-06-27 MED ORDER — HYDROCODONE-HOMATROPINE 5-1.5 MG/5ML PO SYRP: 5.0000 mL | ORAL_SOLUTION | Freq: Four times a day (QID) | ORAL | Status: DC | PRN

## 2015-06-27 MED FILL — DOXYCYCLINE HYCLATE 100 MG: 100 | 10 days supply | Qty: 20 | Fill #0

## 2015-06-27 MED FILL — HYDROCODONE-HOMATROPINE SYR: 5-1.5 | 9 days supply | Qty: 180 | Fill #0

## 2015-06-27 NOTE — Progress Notes (Signed)
Patient ID: [name], female   DOB: 07/31/1960, 55 y.o.   MRN: [id number]    [name] , [birth year], 55 y.o., female MRN: [id number]  CC: cough  Subjective: Pt presents for an acute OV with complaints of cough of 1 week duration. Associated symptoms include wheezing, cough, nasal congestion, rhinorhea, chest congestion.Pt has tried  Delsym, hot toddy/honey, to ease their symptoms. She can not sleep because of coughing. She is eating and drinking ok. Patient went to UC and was treated with solumedrol, tamiflu, prednisone 50 mg x5 d. She has been needing her albuterol and restarted Symbicort. Many people in the house are sick.  Asthma history.   Allergies  Allergen Reactions  . Codeine Itching   Social History  Substance Use Topics  . Smoking status: Never Smoker   . Smokeless tobacco: Never Used  . Alcohol Use: No   Past Medical History  Diagnosis Date  . Anxiety     psychiatrist: Dr. Robina Ade   . Depression     psychiatrist: Dr. Robina Ade  . Renal cell carcinoma     hx of left and right; Dr. Nena Alexander  . Colitis   . Fatty liver   . Elevated liver enzymes     Dr. Rulon Abide following  . Arthritis   . History of chicken pox   . Colon polyps   . GERD (gastroesophageal reflux disease)   . Heart murmur    Past Surgical History  Procedure Laterality Date  . Cholecystectomy    . Tonsillectomy    . Nephrectomy      partial LT  . Fetal radio frequency ablation    . Fracture surgery Right     ankle  . Joint replacement  2009    knee   Family History  Problem Relation Age of Onset  . Hypertension Mother   . Alzheimer's disease Mother   . Arthritis Mother   . Hypertension Father   . Alzheimer's disease Father   . COPD Father   . Heart disease Father   . Arthritis Father   . Hypertension Sister      Medication List       This list is accurate as of: 06/27/15 10:58 AM.  Always use your most recent med list.               albuterol (2.5 MG/3ML) 0.083%  nebulizer solution  Commonly known as:  PROVENTIL  Take 3 mLs (2.5 mg total) by nebulization every 6 (six) hours as needed for wheezing or shortness of breath.     albuterol 108 (90 Base) MCG/ACT inhaler  Commonly known as:  VENTOLIN HFA  Inhale 2 puffs into the lungs every 6 (six) hours as needed for wheezing or shortness of breath.     ALPRAZolam 0.5 MG 24 hr tablet  Commonly known as:  XANAX XR  Take 0.5 mg by mouth daily. 2 tabs q AM     amphetamine-dextroamphetamine 20 MG tablet  Commonly known as:  ADDERALL  Take 20 mg by mouth 3 (three) times daily.     BRINTELLIX 10 MG Tabs  Generic drug:  Vortioxetine HBr  Take 1 tablet by mouth daily.     TRINTELLIX 5 MG Tabs  Generic drug:  Vortioxetine HBr  Take 20 mg by mouth daily.     budesonide-formoterol 160-4.5 MCG/ACT inhaler  Commonly known as:  SYMBICORT  Inhale 2 puffs into the lungs 2 (two) times daily. Rinse after each use  hydrochlorothiazide 25 MG tablet  Commonly known as:  HYDRODIURIL  Take 1 tablet (25 mg total) by mouth daily.     NEXIUM 40 MG capsule  Generic drug:  esomeprazole  TAKE 1 CAPSULE BY MOUTH ONCE DAILY BEFORE BREAKFAST     potassium chloride SA 20 MEQ tablet  Commonly known as:  K-DUR,KLOR-CON  Take 1 tablet (20 mEq total) by mouth 2 (two) times daily.     RESTASIS 0.05 % ophthalmic emulsion  Generic drug:  cycloSPORINE         ROS: Negative, with the exception of above mentioned in HPI   Objective:  BP 128/67 mmHg  Pulse 76  Temp(Src) 98.6 F (37 C)  Resp 20  Wt 189 lb 12 oz (86.07 kg)  SpO2 95%  LMP 04/03/2013 Body mass index is 34.13 kg/(m^2). Gen: Afebrile. No acute distress. Nontoxic in appearance, well-developed, well-nourished, Caucasian female. Very pleasant. HENT: AT. [place]. Bilateral TM visualized, air fluid level left ear.. MMM, no oral lesions. Bilateral nares with erythema, no bogginess. Throat without erythema or exudates. Postnasal drip present. Deep cough present.  Hoarseness present.  Eyes:Pupils Equal Round Reactive to light, Extraocular movements intact,  Conjunctiva without redness, discharge or icterus. Neck/lymp/endocrine: Supple, No lymphadenopathy CV: RRR  Chest: Expiratory wheeze, crackles at the inspiration bilateral bases. Good air movement, normal resp effort.  Abd: Soft. NTND. BS present Skin: No rashes, purpura or petechiae.  Neuro: Normal gait. PERLA. EOMi. Alert. Oriented x3   Assessment/Plan: Velvia Ciresi is a 55 y.o. female present for acute OV for  1. Acute bronchitis, unspecified organism - DuoNeb treatment provided in the office, very minimal improvement in wheezing. Prescribe doxycycline and Hycodan for cough. Patient was encouraged to rest, hydrate. Continue Flonase and Mucinex. - DG Chest 2 View; Future - doxycycline (VIBRA-TABS) 100 MG tablet; Take 1 tablet (100 mg total) by mouth 2 (two) times daily.  Dispense: 20 tablet; Refill: 0 - Follow-up one week if no improvement, sooner if worsening.  electronically signed by:  Howard Pouch, DO  [place]

## 2015-06-27 NOTE — Addendum Note (Signed)
Addended by: Leota Jacobsen on: 06/27/2015 01:57 PM   Modules accepted: Orders

## 2015-06-27 NOTE — Patient Instructions (Signed)
Doxycycline prescribed. Hycodan called in for night time cough (no refills). Please get CXR.  Acute Bronchitis Bronchitis is inflammation of the airways that extend from the windpipe into the lungs (bronchi). The inflammation often causes mucus to develop. This leads to a cough, which is the most common symptom of bronchitis.  In acute bronchitis, the condition usually develops suddenly and goes away over time, usually in a couple weeks. Smoking, allergies, and asthma can make bronchitis worse. Repeated episodes of bronchitis may cause further lung problems.  CAUSES Acute bronchitis is most often caused by the same virus that causes a cold. The virus can spread from person to person (contagious) through coughing, sneezing, and touching contaminated objects. SIGNS AND SYMPTOMS   Cough.   Fever.   Coughing up mucus.   Body aches.   Chest congestion.   Chills.   Shortness of breath.   Sore throat.  DIAGNOSIS  Acute bronchitis is usually diagnosed through a physical exam. Your health care provider will also ask you questions about your medical history. Tests, such as chest X-rays, are sometimes done to rule out other conditions.  TREATMENT  Acute bronchitis usually goes away in a couple weeks. Oftentimes, no medical treatment is necessary. Medicines are sometimes given for relief of fever or cough. Antibiotic medicines are usually not needed but may be prescribed in certain situations. In some cases, an inhaler may be recommended to help reduce shortness of breath and control the cough. A cool mist vaporizer may also be used to help thin bronchial secretions and make it easier to clear the chest.  HOME CARE INSTRUCTIONS  Get plenty of rest.   Drink enough fluids to keep your urine clear or pale yellow (unless you have a medical condition that requires fluid restriction). Increasing fluids may help thin your respiratory secretions (sputum) and reduce chest congestion, and it  will prevent dehydration.   Take medicines only as directed by your health care provider.  If you were prescribed an antibiotic medicine, finish it all even if you start to feel better.  Avoid smoking and secondhand smoke. Exposure to cigarette smoke or irritating chemicals will make bronchitis worse. If you are a smoker, consider using nicotine gum or skin patches to help control withdrawal symptoms. Quitting smoking will help your lungs heal faster.   Reduce the chances of another bout of acute bronchitis by washing your hands frequently, avoiding people with cold symptoms, and trying not to touch your hands to your mouth, nose, or eyes.   Keep all follow-up visits as directed by your health care provider.  SEEK MEDICAL CARE IF: Your symptoms do not improve after 1 week of treatment.  SEEK IMMEDIATE MEDICAL CARE IF:  You develop an increased fever or chills.   You have chest pain.   You have severe shortness of breath.  You have bloody sputum.   You develop dehydration.  You faint or repeatedly feel like you are going to pass out.  You develop repeated vomiting.  You develop a severe headache. MAKE SURE YOU:   Understand these instructions.  Will watch your condition.  Will get help right away if you are not doing well or get worse.   This information is not intended to replace advice given to you by your health care provider. Make sure you discuss any questions you have with your health care provider.   Document Released: 04/29/2004 Document Revised: 04/12/2014 Document Reviewed: 09/12/2012 Elsevier Interactive Patient Education [employer].

## 2015-06-27 NOTE — Telephone Encounter (Signed)
Please call pt: CXR is normal, No signs of pneumonia. Complete abx as prescribed. F/U 1 week if worsening or not improving symptoms.

## 2015-06-30 NOTE — Telephone Encounter (Signed)
Spoke with patient reviewed xray results and instructions. Patient verbalized understanding. 

## 2015-07-01 DIAGNOSIS — F332 Major depressive disorder, recurrent severe without psychotic features: Secondary | ICD-10-CM | POA: Diagnosis not present

## 2015-07-07 DIAGNOSIS — K7581 Nonalcoholic steatohepatitis (NASH): Secondary | ICD-10-CM | POA: Diagnosis not present

## 2015-07-07 DIAGNOSIS — K760 Fatty (change of) liver, not elsewhere classified: Secondary | ICD-10-CM | POA: Diagnosis not present

## 2015-07-07 DIAGNOSIS — R748 Abnormal levels of other serum enzymes: Secondary | ICD-10-CM | POA: Diagnosis not present

## 2015-07-09 ENCOUNTER — Encounter: Payer: Self-pay | Admitting: Family Medicine

## 2015-07-09 ENCOUNTER — Ambulatory Visit (INDEPENDENT_AMBULATORY_CARE_PROVIDER_SITE_OTHER): Payer: 59 | Admitting: Family Medicine

## 2015-07-09 VITALS — BP 120/71 | HR 82 | Temp 98.7°F | Resp 20 | Wt 188.2 lb

## 2015-07-09 DIAGNOSIS — H65192 Other acute nonsuppurative otitis media, left ear: Secondary | ICD-10-CM | POA: Diagnosis not present

## 2015-07-09 DIAGNOSIS — J4540 Moderate persistent asthma, uncomplicated: Secondary | ICD-10-CM | POA: Diagnosis not present

## 2015-07-09 DIAGNOSIS — J209 Acute bronchitis, unspecified: Secondary | ICD-10-CM

## 2015-07-09 MED ORDER — AMOXICILLIN 500 MG PO CAPS: 500.0000 mg | ORAL_CAPSULE | Freq: Three times a day (TID) | ORAL | Status: DC

## 2015-07-09 NOTE — Progress Notes (Signed)
Patient ID: [name], female   DOB: Sep 30, 1960, 55 y.o.   MRN: [id number]    Kearsten Eaves , 11-13-1960, 55 y.o., female MRN: [id number]  CC: cough  Subjective: Pt presents for an acute OV with complaints of continued to cough, fatigue, hoarseness. Patient was diagnosed with influenza, treated with solumedrol, tamiflu, prednisone 50 mg x5 d in urgent care. She then presented to this office approximately a 1 week after with asthma exacerbation/bronchitis features with diffuse wheezing. She was treated with doxycycline at that time. Her chest x-ray was normal. She has continued Symbicort, and has not needed albuterol. She states her throat feels like there is a dry tickle or something stuck in her throat and she feels like she is constantly clearing her throat without any production. She has been keeping herself well-hydrated, still using cough syrup only as needed and occasional dose of Mucinex. She does admit to occasional sweats. She did have an event where she came dizzy and had nausea 1. She has never experienced anything like that prior. Was resolved within seconds.   Allergies  Allergen Reactions  . Codeine Itching   Social History  Substance Use Topics  . Smoking status: Never Smoker   . Smokeless tobacco: Never Used  . Alcohol Use: No   Past Medical History  Diagnosis Date  . Anxiety     psychiatrist: Dr. Robina Ade   . Depression     psychiatrist: Dr. Robina Ade  . Renal cell carcinoma     hx of left and right; Dr. Nena Alexander  . Colitis   . Fatty liver   . Elevated liver enzymes     Dr. Rulon Abide following  . Arthritis   . History of chicken pox   . Colon polyps   . GERD (gastroesophageal reflux disease)   . Heart murmur    Past Surgical History  Procedure Laterality Date  . Cholecystectomy    . Tonsillectomy    . Nephrectomy      partial LT  . Fetal radio frequency ablation    . Fracture surgery Right     ankle  . Joint replacement  2009    knee   Family  History  Problem Relation Age of Onset  . Hypertension Mother   . Alzheimer's disease Mother   . Arthritis Mother   . Hypertension Father   . Alzheimer's disease Father   . COPD Father   . Heart disease Father   . Arthritis Father   . Hypertension Sister      Medication List       This list is accurate as of: 07/09/15  4:07 PM.  Always use your most recent med list.               albuterol (2.5 MG/3ML) 0.083% nebulizer solution  Commonly known as:  PROVENTIL  Take 3 mLs (2.5 mg total) by nebulization every 6 (six) hours as needed for wheezing or shortness of breath.     albuterol 108 (90 Base) MCG/ACT inhaler  Commonly known as:  VENTOLIN HFA  Inhale 2 puffs into the lungs every 6 (six) hours as needed for wheezing or shortness of breath.     ALPRAZolam 0.5 MG 24 hr tablet  Commonly known as:  XANAX XR  Take 0.5 mg by mouth daily. 2 tabs q AM     amphetamine-dextroamphetamine 20 MG tablet  Commonly known as:  ADDERALL  Take 20 mg by mouth 3 (three) times daily.     budesonide-formoterol  160-4.5 MCG/ACT inhaler  Commonly known as:  SYMBICORT  Inhale 2 puffs into the lungs 2 (two) times daily. Rinse after each use     hydrochlorothiazide 25 MG tablet  Commonly known as:  HYDRODIURIL  Take 1 tablet (25 mg total) by mouth daily.     HYDROcodone-homatropine 5-1.5 MG/5ML syrup  Commonly known as:  HYCODAN  Take 5 mLs by mouth every 6 (six) hours as needed for cough.     NEXIUM 40 MG capsule  Generic drug:  esomeprazole  TAKE 1 CAPSULE BY MOUTH ONCE DAILY BEFORE BREAKFAST     potassium chloride SA 20 MEQ tablet  Commonly known as:  K-DUR,KLOR-CON  Take 1 tablet (20 mEq total) by mouth 2 (two) times daily.     RESTASIS 0.05 % ophthalmic emulsion  Generic drug:  cycloSPORINE     TRINTELLIX 20 MG Tabs  Generic drug:  Vortioxetine HBr  Take 20 mg by mouth daily.         ROS: Negative, with the exception of above mentioned in HPI  Objective:  BP 120/71 mmHg   Pulse 82  Temp(Src) 98.7 F (37.1 C)  Resp 20  Wt 188 lb 4 oz (85.39 kg)  SpO2 96%  LMP 04/03/2013 Body mass index is 33.86 kg/(m^2). Gen: Afebrile. No acute distress. Nontoxic in appearance, well-developed, well-nourished, Caucasian female. Very pleasant. HENT: AT. [place]. Bilateral TM visualized, air fluid level left ear ? Versus small red mass behind tympanic membrane. Right tympanic membrane without erythema, bulging or abnormality. Mildly dry mucous membranes. Bilateral nares with erythema, mild bogginess. Throat without erythema or exudates. Postnasal drip present. Deep cough present. Hoarseness present.  Eyes:Pupils Equal Round Reactive to light, Extraocular movements intact,  Conjunctiva without redness, discharge or icterus. Neck/lymp/endocrine: Supple, No lymphadenopathy CV: RRR  Chest: Clear to auscultation bilaterally, no wheezing, rhonchi or rales. Good air movement, normal respiratory effort. Skin: No rashes, purpura or petechiae.  Neuro: Normal gait. PERLA. EOMi. Alert. Oriented x3   Assessment/Plan: Yomira Marinelli is a 55 y.o. female present for acute OV for   Acute bronchitis, unspecified organism/sinus infection/left ear effusion - Patient still with bronchitis like symptoms and sinus symptoms. Left ear ?  persistent effusion, small pink/red area behind tympanic membrane at the 7:00 position. - Discussed with patient today to want to cover her persisting symptoms and sinus/ear symptoms with amoxicillin 3 times a day for 7 days. I do want to follow-up with her in approximately 3 weeks to ensure vertigo has resolved and left ear abnormality has resolved. His area of concern still remains on exam of her left ear, will refer her to ENT at that time. - Patient is continue antihistamine and Flonase, and asthma medications. - amoxicillin (AMOXIL) 500 MG capsule; Take 1 capsule (500 mg total) by mouth 3 (three) times daily.  Dispense: 21 capsule; Refill: 0  electronically signed  by:  Howard Pouch, DO  [place]

## 2015-07-09 NOTE — Patient Instructions (Signed)
Follow up in 3-4 weeks to re-check ear.  continue flonase and mucinex if needed. Rest and get more hydration.  Amoxicillin called in for 7 days TID .

## 2015-07-15 DIAGNOSIS — F332 Major depressive disorder, recurrent severe without psychotic features: Secondary | ICD-10-CM | POA: Diagnosis not present

## 2015-07-22 DIAGNOSIS — F411 Generalized anxiety disorder: Secondary | ICD-10-CM | POA: Diagnosis not present

## 2015-07-22 DIAGNOSIS — F900 Attention-deficit hyperactivity disorder, predominantly inattentive type: Secondary | ICD-10-CM | POA: Diagnosis not present

## 2015-07-22 DIAGNOSIS — F410 Panic disorder [episodic paroxysmal anxiety] without agoraphobia: Secondary | ICD-10-CM | POA: Diagnosis not present

## 2015-07-22 DIAGNOSIS — F3342 Major depressive disorder, recurrent, in full remission: Secondary | ICD-10-CM | POA: Diagnosis not present

## 2015-07-28 ENCOUNTER — Ambulatory Visit (INDEPENDENT_AMBULATORY_CARE_PROVIDER_SITE_OTHER): Payer: 59 | Admitting: Family Medicine

## 2015-07-28 ENCOUNTER — Encounter: Payer: Self-pay | Admitting: Family Medicine

## 2015-07-28 VITALS — BP 113/78 | HR 98 | Temp 98.9°F | Resp 20 | Wt 187.2 lb

## 2015-07-28 DIAGNOSIS — K219 Gastro-esophageal reflux disease without esophagitis: Secondary | ICD-10-CM

## 2015-07-28 DIAGNOSIS — R490 Dysphonia: Secondary | ICD-10-CM | POA: Diagnosis not present

## 2015-07-28 DIAGNOSIS — H9392 Unspecified disorder of left ear: Secondary | ICD-10-CM

## 2015-07-28 DIAGNOSIS — Z01118 Encounter for examination of ears and hearing with other abnormal findings: Secondary | ICD-10-CM | POA: Insufficient documentation

## 2015-07-28 DIAGNOSIS — H524 Presbyopia: Secondary | ICD-10-CM | POA: Diagnosis not present

## 2015-07-28 NOTE — Progress Notes (Signed)
Patient ID: [name], female   DOB: Sep 30, 1960, 55 y.o.   MRN: [id number]    [name] , [birth year], 55 y.o., female MRN: [id number]  CC: left ear pressure Subjective:  Left ear/hoarseness: patient returns for evaluation of her left ear. She has been seen a few times over the last 6 months for sinus infections/bronchitis. Last two exams noted an abnormality in left ear anatomy that is not consistent with right ear exam. There has been a small pink/red appearance behind TM at 7 o'clock position that has been unchanged. Pt denies hearing loss or tinnitus. She is experiencing vertigo, which is new. The vertigo started just prior to flu and her abnormal exam. Pt states the vertigo remains and is now daily, although not as severe as the first episode. Pt is has been changing medications over this time for her depression through her psychiatrist. She is still hoarse from "bronchitis". She does have GERD, and admits to stopping her  nexium. She has been treated for flu, sinus infection and bronchitis over the last 6 months. Her initial episode of vertigo she describes as severe and feeling like she needed somebody to hold her head up for her. She was seated at the time, eating her dinner. She reports feeling like her head was heavy and the room was spinning. She reports that lasted approximately 4 minutes, she then vomited twice in the feelings went away completely. Currently she reports her vertigo seems to occur mostly with head movements, although she is unable to decipher if it occurs with one side more than the other. She reports the vertigo is occurring daily. She is using the Flonase and the antihistamine. She does endorse mild left ear pressure, but no pain.   Prior note:  Pt presents for an acute OV with complaints of continued to cough, fatigue, hoarseness. Patient was diagnosed with influenza, treated with solumedrol, tamiflu, prednisone 50 mg x5 d in urgent care. She then  presented to this office approximately a 1 week after with asthma exacerbation/bronchitis features with diffuse wheezing. She was treated with doxycycline at that time. Her chest x-ray was normal. She has continued Symbicort, and has not needed albuterol. She states her throat feels like there is a dry tickle or something stuck in her throat and she feels like she is constantly clearing her throat without any production. She has been keeping herself well-hydrated, still using cough syrup only as needed and occasional dose of Mucinex. She does admit to occasional sweats. She did have an event where she came dizzy and had nausea 1. She has never experienced anything like that prior. Was resolved within seconds.   Allergies  Allergen Reactions  . Codeine Itching   Social History  Substance Use Topics  . Smoking status: Never Smoker   . Smokeless tobacco: Never Used  . Alcohol Use: No   Past Medical History  Diagnosis Date  . Anxiety     psychiatrist: Dr. Robina Ade   . Depression     psychiatrist: Dr. Robina Ade  . Renal cell carcinoma     hx of left and right; Dr. Nena Alexander  . Colitis   . Fatty liver   . Elevated liver enzymes     Dr. Rulon Abide following  . Arthritis   . History of chicken pox   . Colon polyps   . GERD (gastroesophageal reflux disease)   . Heart murmur    Past Surgical History  Procedure Laterality Date  . Cholecystectomy    .  Tonsillectomy    . Nephrectomy      partial LT  . Fetal radio frequency ablation    . Fracture surgery Right     ankle  . Joint replacement  2009    knee   Family History  Problem Relation Age of Onset  . Hypertension Mother   . Alzheimer's disease Mother   . Arthritis Mother   . Hypertension Father   . Alzheimer's disease Father   . COPD Father   . Heart disease Father   . Arthritis Father   . Hypertension Sister      Medication List       This list is accurate as of: 07/28/15  9:56 AM.  Always use your most recent med list.                 albuterol (2.5 MG/3ML) 0.083% nebulizer solution  Commonly known as:  PROVENTIL  Take 3 mLs (2.5 mg total) by nebulization every 6 (six) hours as needed for wheezing or shortness of breath.     albuterol 108 (90 Base) MCG/ACT inhaler  Commonly known as:  VENTOLIN HFA  Inhale 2 puffs into the lungs every 6 (six) hours as needed for wheezing or shortness of breath.     ALPRAZolam 0.5 MG 24 hr tablet  Commonly known as:  XANAX XR  Take 0.5 mg by mouth daily. 2 tabs q AM     amphetamine-dextroamphetamine 20 MG tablet  Commonly known as:  ADDERALL  Take 20 mg by mouth 3 (three) times daily.     budesonide-formoterol 160-4.5 MCG/ACT inhaler  Commonly known as:  SYMBICORT  Inhale 2 puffs into the lungs 2 (two) times daily. Rinse after each use     hydrochlorothiazide 25 MG tablet  Commonly known as:  HYDRODIURIL  Take 1 tablet (25 mg total) by mouth daily.     NEXIUM 40 MG capsule  Generic drug:  esomeprazole  TAKE 1 CAPSULE BY MOUTH ONCE DAILY BEFORE BREAKFAST     potassium chloride SA 20 MEQ tablet  Commonly known as:  K-DUR,KLOR-CON  Take 1 tablet (20 mEq total) by mouth 2 (two) times daily.     RESTASIS 0.05 % ophthalmic emulsion  Generic drug:  cycloSPORINE     TRINTELLIX 20 MG Tabs  Generic drug:  Vortioxetine HBr  Take 20 mg by mouth daily.         ROS: Negative, with the exception of above mentioned in HPI  Objective:  BP 113/78 mmHg  Pulse 98  Temp(Src) 98.9 F (37.2 C) (Oral)  Resp 20  Wt 187 lb 4 oz (84.936 kg)  SpO2 98%  LMP 04/03/2013 Body mass index is 33.68 kg/(m^2). Gen: Afebrile. No acute distress. Nontoxic in appearance, well-developed, well-nourished, Caucasian female. Very pleasant. HENT: AT. [place]. Bilateral TM visualized, small red mass? behind tympanic membrane 7:00 position. Right tympanic membrane without erythema, bulging or abnormality. Mildly dry mucous membranes. Bilateral nares with erythema or bogginess. Throat without erythema or  exudates. No cough present, mild hoarseness present.  Eyes:Pupils Equal Round Reactive to light, Extraocular movements intact,  Conjunctiva without redness, discharge or icterus. Neck/lymp/endocrine: Supple, No lymphadenopathy CV: RRR  Chest: Clear to auscultation bilaterally, no wheezing, rhonchi or rales. Good air movement, normal respiratory effort. Skin: No rashes, purpura or petechiae.  Neuro: Normal gait. PERLA. EOMi. Alert. Oriented x3   Assessment/Plan: [name] is a 55 y.o. female present for acute OV for  Abnormal ear exam, left With new vertigo  Hoarseness/history of GERD - Discussed with patient possible etiologies of difference exams between the 2 ears. Considered persistent effusion, however exam has not changed over the last few months. New vertigo, although sounds more positional would still prefer to send to ENT for formal evaluation. - Patient is to continue antihistamines including Flonase - AVS on vertigo and BPPV provided to patient today. - She is to restart her Nexium to ensure hoarseness is not from her GERD. - Ambulatory referral to ENT for all the above.   electronically signed by:  Howard Pouch, DO  [place]

## 2015-07-28 NOTE — Patient Instructions (Signed)
Vertigo Vertigo means you feel like you or your surroundings are moving when they are not. Vertigo can be dangerous if it occurs when you are at work, driving, or performing difficult activities.  CAUSES  Vertigo occurs when there is a conflict of signals sent to your brain from the visual and sensory systems in your body. There are many different causes of vertigo, including:  Infections, especially in the inner ear.  A bad reaction to a drug or misuse of alcohol and medicines.  Withdrawal from drugs or alcohol.  Rapidly changing positions, such as lying down or rolling over in bed.  A migraine headache.  Decreased blood flow to the brain.  Increased pressure in the brain from a head injury, infection, tumor, or bleeding. SYMPTOMS  You may feel as though the world is spinning around or you are falling to the ground. Because your balance is upset, vertigo can cause nausea and vomiting. You may have involuntary eye movements (nystagmus). DIAGNOSIS  Vertigo is usually diagnosed by physical exam. If the cause of your vertigo is unknown, your caregiver may perform imaging tests, such as an MRI scan (magnetic resonance imaging). TREATMENT  Most cases of vertigo resolve on their own, without treatment. Depending on the cause, your caregiver may prescribe certain medicines. If your vertigo is related to body position issues, your caregiver may recommend movements or procedures to correct the problem. In rare cases, if your vertigo is caused by certain inner ear problems, you may need surgery. HOME CARE INSTRUCTIONS   Follow your caregiver's instructions.  Avoid driving.  Avoid operating heavy machinery.  Avoid performing any tasks that would be dangerous to you or others during a vertigo episode.  Tell your caregiver if you notice that certain medicines seem to be causing your vertigo. Some of the medicines used to treat vertigo episodes can actually make them worse in some people. SEEK  IMMEDIATE MEDICAL CARE IF:   Your medicines do not relieve your vertigo or are making it worse.  You develop problems with talking, walking, weakness, or using your arms, hands, or legs.  You develop severe headaches.  Your nausea or vomiting continues or gets worse.  You develop visual changes.  A family member notices behavioral changes.  Your condition gets worse. MAKE SURE YOU:  Understand these instructions.  Will watch your condition.  Will get help right away if you are not doing well or get worse.   This information is not intended to replace advice given to you by your health care provider. Make sure you discuss any questions you have with your health care provider.   Document Released: 12/30/2004 Document Revised: 06/14/2011 Document Reviewed: 07/15/2014 Elsevier Interactive Patient Education 2016 [employer]  Benign Positional Vertigo Vertigo is the feeling that you or your surroundings are moving when they are not. Benign positional vertigo is the most common form of vertigo. The cause of this condition is not serious (is benign). This condition is triggered by certain movements and positions (is positional). This condition can be dangerous if it occurs while you are doing something that could endanger you or others, such as driving.  CAUSES In many cases, the cause of this condition is not known. It may be caused by a disturbance in an area of the inner ear that helps your brain to sense movement and balance. This disturbance can be caused by a viral infection (labyrinthitis), head injury, or repetitive motion. RISK FACTORS This condition is more likely to develop in:  Women.  People who are 55 years of age or older. SYMPTOMS Symptoms of this condition usually happen when you move your head or your eyes in different directions. Symptoms may start suddenly, and they usually last for less than a minute. Symptoms may include:  Loss of balance and  falling.  Feeling like you are spinning or moving.  Feeling like your surroundings are spinning or moving.  Nausea and vomiting.  Blurred vision.  Dizziness.  Involuntary eye movement (nystagmus). Symptoms can be mild and cause only slight annoyance, or they can be severe and interfere with daily life. Episodes of benign positional vertigo may return (recur) over time, and they may be triggered by certain movements. Symptoms may improve over time. DIAGNOSIS This condition is usually diagnosed by medical history and a physical exam of the head, neck, and ears. You may be referred to a health care provider who specializes in ear, nose, and throat (ENT) problems (otolaryngologist) or a provider who specializes in disorders of the nervous system (neurologist). You may have additional testing, including:  MRI.  A CT scan.  Eye movement tests. Your health care provider may ask you to change positions quickly while he or she watches you for symptoms of benign positional vertigo, such as nystagmus. Eye movement may be tested with an electronystagmogram (ENG), caloric stimulation, the Dix-Hallpike test, or the roll test.  An electroencephalogram (EEG). This records electrical activity in your brain.  Hearing tests. TREATMENT Usually, your health care provider will treat this by moving your head in specific positions to adjust your inner ear back to normal. Surgery may be needed in severe cases, but this is rare. In some cases, benign positional vertigo may resolve on its own in 2-4 weeks. HOME CARE INSTRUCTIONS Safety  Move slowly.Avoid sudden body or head movements.  Avoid driving.  Avoid operating heavy machinery.  Avoid doing any tasks that would be dangerous to you or others if a vertigo episode would occur.  If you have trouble walking or keeping your balance, try using a cane for stability. If you feel dizzy or unstable, sit down right away.  Return to your normal activities as  told by your health care provider. Ask your health care provider what activities are safe for you. General Instructions  Take over-the-counter and prescription medicines only as told by your health care provider.  Avoid certain positions or movements as told by your health care provider.  Drink enough fluid to keep your urine clear or pale yellow.  Keep all follow-up visits as told by your health care provider. This is important. SEEK MEDICAL CARE IF:  You have a fever.  Your condition gets worse or you develop new symptoms.  Your family or friends notice any behavioral changes.  Your nausea or vomiting gets worse.  You have numbness or a "pins and needles" sensation. SEEK IMMEDIATE MEDICAL CARE IF:  You have difficulty speaking or moving.  You are always dizzy.  You faint.  You develop severe headaches.  You have weakness in your legs or arms.  You have changes in your hearing or vision.  You develop a stiff neck.  You develop sensitivity to light.   This information is not intended to replace advice given to you by your health care provider. Make sure you discuss any questions you have with your health care provider.   Document Released: 12/28/2005 Document Revised: 12/11/2014 Document Reviewed: 07/15/2014 Elsevier Interactive Patient Education [employer].

## 2015-07-29 DIAGNOSIS — F332 Major depressive disorder, recurrent severe without psychotic features: Secondary | ICD-10-CM | POA: Diagnosis not present

## 2015-07-29 MED FILL — LASTACAFT 0.25% EYE DROPS: 0.25 | 30 days supply | Qty: 3 | Fill #0

## 2015-07-29 MED FILL — VENTOLIN HFA 90 MCG INHALER: 108 (90 BAS | 25 days supply | Qty: 18 | Fill #2

## 2015-07-29 MED FILL — RESTASIS MULTIDOSE 0.05% EY: 0.05 | 82 days supply | Qty: 17 | Fill #0

## 2015-07-29 MED FILL — ALPRAZolam ER 0.5 MG TB24: 0.5 | 90 days supply | Qty: 270 | Fill #0

## 2015-07-29 MED FILL — TRINTELLIX 5 MG TABLET: 5 | 90 days supply | Qty: 90 | Fill #0

## 2015-07-29 MED FILL — SYMBICORT 160-4.5 MCG INH: 160-4.5 | 30 days supply | Qty: 10 | Fill #2

## 2015-08-11 ENCOUNTER — Ambulatory Visit: Payer: 59 | Admitting: Family Medicine

## 2015-08-13 DIAGNOSIS — K219 Gastro-esophageal reflux disease without esophagitis: Secondary | ICD-10-CM | POA: Insufficient documentation

## 2015-08-19 MED FILL — HYDROCHLOROTHIAZIDE 25 MG T: 25 | 90 days supply | Qty: 90 | Fill #1

## 2015-08-19 MED FILL — OLANZAPINE-FLUOXETINE 3-25: 3-25 | 30 days supply | Qty: 30 | Fill #0

## 2015-08-27 ENCOUNTER — Ambulatory Visit: Payer: 59 | Admitting: Family Medicine

## 2015-09-02 MED FILL — DEXTROAMP-AMPHET ER 30 MG C: 30 | 30 days supply | Qty: 30 | Fill #0

## 2015-09-02 MED FILL — DEXTROAMP-AMP 30 MG TABLET: 30 | 30 days supply | Qty: 30 | Fill #0

## 2015-09-03 ENCOUNTER — Other Ambulatory Visit: Payer: Self-pay | Admitting: Internal Medicine

## 2015-09-03 MED ORDER — BUDESONIDE-FORMOTEROL FUMARATE 160-4.5 MCG/ACT IN AERO: 2.0000 | INHALATION_SPRAY | Freq: Two times a day (BID) | RESPIRATORY_TRACT | Status: DC

## 2015-09-03 NOTE — Telephone Encounter (Signed)
Per CY-okay to refill 90 day supply with 3 additional refills. Rx has been sent and nothing further needed at this time.

## 2015-09-22 ENCOUNTER — Ambulatory Visit (INDEPENDENT_AMBULATORY_CARE_PROVIDER_SITE_OTHER): Admitting: Family Medicine

## 2015-09-22 ENCOUNTER — Encounter: Payer: Self-pay | Admitting: Family Medicine

## 2015-09-22 VITALS — BP 134/81 | HR 67 | Temp 97.9°F | Resp 20 | Ht 63.0 in | Wt 189.0 lb

## 2015-09-22 DIAGNOSIS — R7309 Other abnormal glucose: Secondary | ICD-10-CM | POA: Insufficient documentation

## 2015-09-22 DIAGNOSIS — E785 Hyperlipidemia, unspecified: Secondary | ICD-10-CM

## 2015-09-22 DIAGNOSIS — F329 Major depressive disorder, single episode, unspecified: Secondary | ICD-10-CM

## 2015-09-22 DIAGNOSIS — Z6833 Body mass index (BMI) 33.0-33.9, adult: Secondary | ICD-10-CM

## 2015-09-22 DIAGNOSIS — I10 Essential (primary) hypertension: Secondary | ICD-10-CM | POA: Diagnosis not present

## 2015-09-22 DIAGNOSIS — F418 Other specified anxiety disorders: Secondary | ICD-10-CM | POA: Diagnosis not present

## 2015-09-22 DIAGNOSIS — F419 Anxiety disorder, unspecified: Secondary | ICD-10-CM

## 2015-09-22 LAB — HEMOGLOBIN A1C: HEMOGLOBIN A1C: 5.6 % (ref 4.6–6.5)

## 2015-09-22 NOTE — Patient Instructions (Signed)
Watch your diet closely.  Try the PREP program.  Take your BP a few times a week, and if above 130/80, would consider increasing HCTZ.  We will check your a1c today and call you with results. December for preventive exam with fasting labs.

## 2015-09-22 NOTE — Progress Notes (Signed)
Patient ID: [name], female   DOB: [birth year], 55 y.o.   MRN: [id number]    [name] , 1960-05-04, 55 y.o., female MRN: [id number]  CC: chronic medical issues Subjective: Hypertension: Patient reports compliance with HCTZ 25 mg daily. Her BP is borderline today. She reports not exercising as much as she would like. She does body pump sometimes and enjoys it. She denies chest pain, dizziness, shortness of breath or LE edema.  Hyperlipidemia/elevated a1c/BMI >30: Pts weight is stable. She is back on symbyax, and feels better on that medicine compared to the [place] she was tried on. She does not take fish oil supplement. Her last cholesterol in 03/2015 was just mildly above goal total 212 and LDL 130. Her HDL was good (58). Heart disease in her father. Her last a1c 03/2015 5.8, she is on on symbyax.   Allergies  Allergen Reactions  . Codeine Itching   Social History  Substance Use Topics  . Smoking status: Never Smoker   . Smokeless tobacco: Never Used  . Alcohol Use: No   Past Medical History  Diagnosis Date  . Anxiety     psychiatrist: Dr. Robina Ade   . Depression     psychiatrist: Dr. Robina Ade  . Renal cell carcinoma     hx of left and right; Dr. Nena Alexander  . Colitis   . Fatty liver   . Elevated liver enzymes     Dr. Rulon Abide following  . Arthritis   . History of chicken pox   . Colon polyps   . GERD (gastroesophageal reflux disease)   . Heart murmur    Past Surgical History  Procedure Laterality Date  . Cholecystectomy    . Tonsillectomy    . Nephrectomy      partial LT  . Fetal radio frequency ablation    . Fracture surgery Right     ankle  . Joint replacement  2009    knee   Family History  Problem Relation Age of Onset  . Hypertension Mother   . Alzheimer's disease Mother   . Arthritis Mother   . Hypertension Father   . Alzheimer's disease Father   . COPD Father   . Heart disease Father   . Arthritis Father   . Hypertension Sister        Medication List       This list is accurate as of: 09/22/15  9:25 AM.  Always use your most recent med list.               albuterol (2.5 MG/3ML) 0.083% nebulizer solution  Commonly known as:  PROVENTIL  Take 3 mLs (2.5 mg total) by nebulization every 6 (six) hours as needed for wheezing or shortness of breath.     albuterol 108 (90 Base) MCG/ACT inhaler  Commonly known as:  VENTOLIN HFA  Inhale 2 puffs into the lungs every 6 (six) hours as needed for wheezing or shortness of breath.     ALPRAZolam 0.5 MG 24 hr tablet  Commonly known as:  XANAX XR  Take 0.5 mg by mouth daily. 2 tabs q AM     amphetamine-dextroamphetamine 20 MG tablet  Commonly known as:  ADDERALL  Take 20 mg by mouth 3 (three) times daily.     budesonide-formoterol 160-4.5 MCG/ACT inhaler  Commonly known as:  SYMBICORT  Inhale 2 puffs into the lungs 2 (two) times daily. Rinse after each use     hydrochlorothiazide 25 MG tablet  Commonly known as:  HYDRODIURIL  Take 1 tablet (25 mg total) by mouth daily.     LASTACAFT 0.25 % Soln  Generic drug:  Alcaftadine     NEXIUM 40 MG capsule  Generic drug:  esomeprazole  TAKE 1 CAPSULE BY MOUTH ONCE DAILY BEFORE BREAKFAST     OLANZapine-FLUoxetine 3-25 MG capsule  Commonly known as:  SYMBYAX     potassium chloride SA 20 MEQ tablet  Commonly known as:  K-DUR,KLOR-CON  Take 1 tablet (20 mEq total) by mouth 2 (two) times daily.     RESTASIS 0.05 % ophthalmic emulsion  Generic drug:  cycloSPORINE     TRINTELLIX 20 MG Tabs  Generic drug:  vortioxetine HBr  Take 20 mg by mouth daily.         ROS: Negative, with the exception of above mentioned in HPI   Objective:  BP 134/81 mmHg  Pulse 67  Temp(Src) 97.9 F (36.6 C)  Resp 20  Ht 5\' 3"  (1.6 m)  Wt 189 lb (85.73 kg)  BMI 33.49 kg/m2  SpO2 96%  LMP 04/03/2013 Body mass index is 33.49 kg/(m^2). Gen: Afebrile. No acute distress. Nontoxic in appearance, well developed, well nourished female.  Pleasant.  HENT: AT. .  MMM, no oral lesions. Eyes:Pupils Equal Round Reactive to light, Extraocular movements intact,  Conjunctiva without redness, discharge or icterus. CV: RRR no murmur, trace (left only) edema, +2/4 P posterior tibialis pulses Chest: CTAB, no wheeze or crackles.  Abd: Soft.obese. NTND. BS present Neuro: Normal gait. PERLA. EOMi. Alert. Oriented x3 Psych: Normal affect, dress and demeanor. Normal speech. Normal thought content and judgment.  Assessment/Plan: [name] is a 55 y.o. female present for acute OV for  Essential hypertension, benign - stable, but borderline. Pt to take her BP at home a few times a week. If consisently 130's/80's or higher would consider HCTZ 50 mg daily.  - Lower sodium diet. - diet and exercise counseling.   BMI 33.0-33.9,adult - diet and exericise- PREP program encouraged.   Anxiety and depression - symbyax continued - Continue following with psych - Watch lipids and a1c on med  Elevated hemoglobin A1c - diet and exercise.  - HgB A1c  Hyperlipidemia - diet and exercise counseling.  - Consider fish oil supplement.  - Follow yearly.    - F/U December for Preventive/CPE/fasting labs.   > 25 minutes spent with patient, >50% of time spent face to face counseling patient and coordinating care.  electronically signed by:  Howard Pouch, DO  [place]

## 2015-09-23 ENCOUNTER — Telehealth: Payer: Self-pay | Admitting: Family Medicine

## 2015-09-23 NOTE — Telephone Encounter (Signed)
Spoke with patient reviewed lab results. 

## 2015-09-23 NOTE — Telephone Encounter (Signed)
Please call pt: - her a1c is good 5.6, this is mildly improved from prior (5.8).

## 2015-10-02 MED FILL — AMPHETAMINE SALTS 30 MG TAB: 30 | 30 days supply | Qty: 30 | Fill #0

## 2015-10-02 MED FILL — DEXTROAMP-AMPHET ER 30 MG C: 30 | 30 days supply | Qty: 30 | Fill #0

## 2015-10-20 ENCOUNTER — Telehealth: Payer: Self-pay | Admitting: Family Medicine

## 2015-10-20 NOTE — Telephone Encounter (Signed)
Patient states her insurance won't cover her last OV due to the coding incorrectly per billing office.  Can you please contact her?  Pt states she does have two insurance carriers, [employer] and [employer].

## 2015-10-31 NOTE — Telephone Encounter (Signed)
I called patient and informed her that her OV had been reviewed by coders and resubmitted to insurance.

## 2015-11-03 MED FILL — AMPHETAMINE SALTS 30 MG TAB: 30 | 30 days supply | Qty: 30 | Fill #0

## 2015-11-03 MED FILL — DEXTROAMP-AMPHET ER 30 MG C: 30 | 30 days supply | Qty: 30 | Fill #0

## 2015-11-11 MED FILL — HYDROCHLOROTHIAZIDE 25 MG T: 25 | 90 days supply | Qty: 90 | Fill #2

## 2015-11-18 ENCOUNTER — Telehealth: Payer: Self-pay | Admitting: Internal Medicine

## 2015-11-18 MED ORDER — PREDNISONE 10 MG PO TABS
ORAL_TABLET | ORAL | 0 refills | Status: DC
Start: 2015-11-18 — End: 2016-03-08

## 2015-11-18 MED ORDER — PREDNISONE 10 MG PO TABS: ORAL_TABLET | ORAL | 0 refills | Status: DC

## 2015-11-18 MED ORDER — DOXYCYCLINE HYCLATE 100 MG PO TABS
ORAL_TABLET | ORAL | 0 refills | Status: DC
Start: 2015-11-18 — End: 2015-11-18

## 2015-11-18 MED ORDER — DOXYCYCLINE HYCLATE 100 MG PO TABS: ORAL_TABLET | ORAL | 0 refills | Status: DC

## 2015-11-18 NOTE — Telephone Encounter (Signed)
Likely we will be able to get her in with an NP before I can see her.  Offer prednisone 10 mg, # 20, 4 X 2 DAYS, 3 X 2 DAYS, 2 X 2 DAYS, 1 X 2 DAYS  Offer doxycycline 100 mg, # 8, 2 today then one daily

## 2015-11-18 NOTE — Telephone Encounter (Signed)
Patient notified of Dr. Janee Morn recommendations. Rx sent to pharmacy in [place].  Patient aware. Nothing further needed.

## 2015-11-18 NOTE — Telephone Encounter (Signed)
Spoke with pt. States that she is getting bronchitis. Reports cough, chest congestion and chest tightness. Cough is producing green mucus. Denies wheezing, fever or SOB. She is currently out of town and is wondering if CY would give her some prednisone and antibiotic. CY - please advise. Thanks.  Allergies  Allergen Reactions  . Codeine Itching   Current Outpatient Prescriptions on File Prior to Visit  Medication Sig Dispense Refill  . albuterol (PROVENTIL) (2.5 MG/3ML) 0.083% nebulizer solution Take 3 mLs (2.5 mg total) by nebulization every 6 (six) hours as needed for wheezing or shortness of breath. 75 mL 12  . albuterol (VENTOLIN HFA) 108 (90 BASE) MCG/ACT inhaler Inhale 2 puffs into the lungs every 6 (six) hours as needed for wheezing or shortness of breath. 1 Inhaler 12  . ALPRAZolam (XANAX XR) 0.5 MG 24 hr tablet Take 0.5 mg by mouth daily. 2 tabs q AM    . amphetamine-dextroamphetamine (ADDERALL) 20 MG tablet Take 20 mg by mouth 3 (three) times daily.    . budesonide-formoterol (SYMBICORT) 160-4.5 MCG/ACT inhaler Inhale 2 puffs into the lungs 2 (two) times daily. Rinse after each use 3 Inhaler 3  . hydrochlorothiazide (HYDRODIURIL) 25 MG tablet Take 1 tablet (25 mg total) by mouth daily. 90 tablet 2  . LASTACAFT 0.25 % SOLN   2  . NEXIUM 40 MG capsule TAKE 1 CAPSULE BY MOUTH ONCE DAILY BEFORE BREAKFAST 30 capsule 2  . OLANZapine-FLUoxetine (SYMBYAX) 3-25 MG capsule     . potassium chloride SA (K-DUR,KLOR-CON) 20 MEQ tablet Take 1 tablet (20 mEq total) by mouth 2 (two) times daily. 180 tablet 2  . RESTASIS 0.05 % ophthalmic emulsion   3  . Vortioxetine HBr (TRINTELLIX) 20 MG TABS Take 20 mg by mouth daily.     No current facility-administered medications on file prior to visit.

## 2015-11-21 ENCOUNTER — Ambulatory Visit (INDEPENDENT_AMBULATORY_CARE_PROVIDER_SITE_OTHER): Admitting: Adult Health

## 2015-11-21 ENCOUNTER — Telehealth: Payer: Self-pay | Admitting: Internal Medicine

## 2015-11-21 ENCOUNTER — Encounter: Payer: Self-pay | Admitting: Adult Health

## 2015-11-21 DIAGNOSIS — J4541 Moderate persistent asthma with (acute) exacerbation: Secondary | ICD-10-CM

## 2015-11-21 MED ORDER — LEVALBUTEROL HCL 0.63 MG/3ML IN NEBU
0.6300 mg | INHALATION_SOLUTION | Freq: Once | RESPIRATORY_TRACT | Status: AC
  Administered 2015-11-21: 0.63 mg via RESPIRATORY_TRACT

## 2015-11-21 MED ORDER — HYDROCODONE-HOMATROPINE 5-1.5 MG/5ML PO SYRP: 5.0000 mL | ORAL_SOLUTION | Freq: Four times a day (QID) | ORAL | 0 refills | Status: DC | PRN

## 2015-11-21 MED ORDER — LEVALBUTEROL HCL 0.63 MG/3ML IN NEBU: 0.6300 mg | INHALATION_SOLUTION | Freq: Four times a day (QID) | RESPIRATORY_TRACT | 5 refills | Status: DC | PRN

## 2015-11-21 MED ORDER — LEVALBUTEROL TARTRATE 45 MCG/ACT IN AERO: 2.0000 | INHALATION_SPRAY | RESPIRATORY_TRACT | 5 refills | Status: DC | PRN

## 2015-11-21 MED ORDER — METHYLPREDNISOLONE ACETATE 80 MG/ML IJ SUSP
120.0000 mg | Freq: Once | INTRAMUSCULAR | Status: AC
  Administered 2015-11-21: 120 mg via INTRAMUSCULAR

## 2015-11-21 MED FILL — LEVALBUTEROL TAR HFA 45MCG: 45 | 16 days supply | Qty: 15 | Fill #0

## 2015-11-21 MED FILL — HYDROCODONE-HOMATROPINE SYR: 5-1.5 | 12 days supply | Qty: 240 | Fill #0

## 2015-11-21 NOTE — Telephone Encounter (Signed)
Pt states that she has not seen much improvement since starting the Prednisone and is having chest tightness and wheezing. Pt is requesting to come in and be seen and maybe get a Depo injection today.   Pt states that she is using Albuterol in her neb machine and it does not seem to be helping as well has it has recently - states that she has used Atrovent in the past and it worked pretty good. Pt asking if this could be an option in helping to improve her bronchitis. Please advise Dr Annamaria Boots. Thanks.    Medication List       Accurate as of 11/21/15 10:07 AM. Always use your most recent med list.          albuterol (2.5 MG/3ML) 0.083% nebulizer solution Commonly known as:  PROVENTIL Take 3 mLs (2.5 mg total) by nebulization every 6 (six) hours as needed for wheezing or shortness of breath.   albuterol 108 (90 Base) MCG/ACT inhaler Commonly known as:  VENTOLIN HFA Inhale 2 puffs into the lungs every 6 (six) hours as needed for wheezing or shortness of breath.   ALPRAZolam 0.5 MG 24 hr tablet Commonly known as:  XANAX XR Take 0.5 mg by mouth daily. 2 tabs q AM   amphetamine-dextroamphetamine 20 MG tablet Commonly known as:  ADDERALL Take 20 mg by mouth 3 (three) times daily.   budesonide-formoterol 160-4.5 MCG/ACT inhaler Commonly known as:  SYMBICORT Inhale 2 puffs into the lungs 2 (two) times daily. Rinse after each use   doxycycline 100 MG tablet Commonly known as:  VIBRA-TABS 2 tablets today, then 1 tablet daily   hydrochlorothiazide 25 MG tablet Commonly known as:  HYDRODIURIL Take 1 tablet (25 mg total) by mouth daily.   LASTACAFT 0.25 % Soln Generic drug:  Alcaftadine   NEXIUM 40 MG capsule Generic drug:  esomeprazole TAKE 1 CAPSULE BY MOUTH ONCE DAILY BEFORE BREAKFAST   OLANZapine-FLUoxetine 3-25 MG capsule Commonly known as:  SYMBYAX   potassium chloride SA 20 MEQ tablet Commonly known as:  K-DUR,KLOR-CON Take 1 tablet (20 mEq total) by mouth 2 (two) times  daily.   predniSONE 10 MG tablet Commonly known as:  DELTASONE 4 X 2 DAYS, 3 X 2 DAYS, 2 X 2 DAYS, 1 X 2 DAYS   RESTASIS 0.05 % ophthalmic emulsion Generic drug:  cycloSPORINE   TRINTELLIX 20 MG Tabs Generic drug:  vortioxetine HBr Take 20 mg by mouth daily.      Allergies  Allergen Reactions  . Codeine Itching

## 2015-11-21 NOTE — Addendum Note (Signed)
Addended by: Osa Craver on: 11/21/2015 02:55 PM   Modules accepted: Orders

## 2015-11-21 NOTE — Telephone Encounter (Signed)
TP are you able to work this pt in today?  Please advise. thanks

## 2015-11-21 NOTE — Progress Notes (Signed)
Subjective:    Patient ID: [name], female    DOB: [birth year], 55 y.o.   MRN: [id number]  HPI 55 yo female never smoker followed for  Hx of renal carcinoma s/p partial left nephrectomy and ablation to right.   11/21/2015 Acute OV  Pt presents for an acute office visit.  She complains of 2 weeks of cough, congestion , wheezing .  chest congestion/tightness, low grade fever at times, sinus congestion, wheezing, prod cough with green/white colored mucus, occ nausea due to the cough. Denies any vomiting.  Called in Doxycycline and Prednisone taper on 8/14. , has 2-3 days left.  Feels she needs a steroid shot, this has always helped her in the past.  Has a lot of throat clearing.  Cough is worse in evening and night.  Says she would like to change to xopenex from albuterol , told it works better .   Was told she could have MA[facility] at [facility] . Had CT chest and Abd that GG and tree in bud and micronodular opacities in bronchovascular patern in RUL . She is watched with serial CT chest  Was not able to give sputum culture.  Has upcoming CT soon.   She was a Therapist, sports.  Remains on Symbicort .  She denies chest pain, orthopnea, edema or fever.      Past Medical History:  Diagnosis Date  . Anxiety    psychiatrist: Dr. Robina Ade   . Arthritis   . Colitis   . Colon polyps   . Depression    psychiatrist: Dr. Robina Ade  . Elevated liver enzymes    Dr. Rulon Abide following  . Fatty liver   . GERD (gastroesophageal reflux disease)   . Heart murmur   . History of chicken pox   . Renal cell carcinoma    hx of left and right; Dr. Nena Alexander   Current Outpatient Prescriptions on File Prior to Visit  Medication Sig Dispense Refill  . albuterol (PROVENTIL) (2.5 MG/3ML) 0.083% nebulizer solution Take 3 mLs (2.5 mg total) by nebulization every 6 (six) hours as needed for wheezing or shortness of breath. 75 mL 12  . albuterol (VENTOLIN HFA) 108 (90 BASE) MCG/ACT inhaler Inhale 2 puffs into the lungs  every 6 (six) hours as needed for wheezing or shortness of breath. 1 Inhaler 12  . ALPRAZolam (XANAX XR) 0.5 MG 24 hr tablet Take 0.5 mg by mouth daily. 2 tabs q AM    . amphetamine-dextroamphetamine (ADDERALL) 20 MG tablet Take 20 mg by mouth 3 (three) times daily.    . budesonide-formoterol (SYMBICORT) 160-4.5 MCG/ACT inhaler Inhale 2 puffs into the lungs 2 (two) times daily. Rinse after each use 3 Inhaler 3  . doxycycline (VIBRA-TABS) 100 MG tablet 2 tablets today, then 1 tablet daily 8 tablet 0  . hydrochlorothiazide (HYDRODIURIL) 25 MG tablet Take 1 tablet (25 mg total) by mouth daily. 90 tablet 2  . LASTACAFT 0.25 % SOLN   2  . NEXIUM 40 MG capsule TAKE 1 CAPSULE BY MOUTH ONCE DAILY BEFORE BREAKFAST 30 capsule 2  . OLANZapine-FLUoxetine (SYMBYAX) 3-25 MG capsule Take 1 capsule by mouth every evening.     . potassium chloride SA (K-DUR,KLOR-CON) 20 MEQ tablet Take 1 tablet (20 mEq total) by mouth 2 (two) times daily. 180 tablet 2  . predniSONE (DELTASONE) 10 MG tablet 4 X 2 DAYS, 3 X 2 DAYS, 2 X 2 DAYS, 1 X 2 DAYS 20 tablet 0  . RESTASIS 0.05 % ophthalmic  emulsion Place 1 drop into both eyes 2 (two) times daily.   3  . Vortioxetine HBr (TRINTELLIX) 20 MG TABS Take 20 mg by mouth daily.     No current facility-administered medications on file prior to visit.      Review of Systems Constitutional:   No  weight loss, night sweats,  Fevers, chills, fatigue, or  lassitude.  HEENT:   No headaches,  Difficulty swallowing,  Tooth/dental problems, or  Sore throat,                No sneezing, itching, ear ache,  +nasal congestion, post nasal drip,   CV:  No chest pain,  Orthopnea, PND, swelling in lower extremities, anasarca, dizziness, palpitations, syncope.   GI  No heartburn, indigestion, abdominal pain, nausea, vomiting, diarrhea, change in bowel habits, loss of appetite, bloody stools.   Resp:    No chest wall deformity  Skin: no rash or lesions.  GU: no dysuria, change in color of  urine, no urgency or frequency.  No flank pain, no hematuria   MS:  No joint pain or swelling.  No decreased range of motion.  No back pain.  Psych:  No change in mood or affect. No depression or anxiety.  No memory loss.         Objective:   Physical Exam Vitals:   11/21/15 1417  BP: 126/74  Pulse: 81  Temp: 98.3 F (36.8 C)  TempSrc: Oral  SpO2: 97%  Weight: 196 lb (88.9 kg)  Height: 5\' 2"  (1.575 m)   GEN: A/Ox3; pleasant , NAD   HEENT:  [place]/AT,  EACs-clear, TMs-wnl, NOSE-clear, THROAT-clear, no lesions, no postnasal drip or exudate noted.   NECK:  Supple w/ fair ROM; no JVD; normal carotid impulses w/o bruits; no thyromegaly or nodules palpated; no lymphadenopathy.    RESP  Few trace wheezes noted ,  no accessory muscle use, no dullness to percussion  CARD:  RRR, no m/r/g  , no peripheral edema, pulses intact, no cyanosis or clubbing.  GI:   Soft & nt; nml bowel sounds; no organomegaly or masses detected.   Musco: Warm bil, no deformities or joint swelling noted.   Neuro: alert, no focal deficits noted.    Skin: Warm, no lesions or rashes  Tammy Parrett NP-C  [facility]  11/21/2015        Assessment & Plan:

## 2015-11-21 NOTE — Telephone Encounter (Signed)
Tell her to come at 2:15 and will work in , please place on 2:45 slot today

## 2015-11-21 NOTE — Telephone Encounter (Signed)
Please see if an NP can work her in today

## 2015-11-21 NOTE — Patient Instructions (Signed)
Finish Doxycycline and Prednisone .  Mucinex and Delsym As needed  Cough/congestion  Hydromet 1 tsp every 4-6 hr as needed for cough , may make you sleepy.  May try Xopenex neb in place of albuterol .  Please contact office for sooner follow up if symptoms do not improve or worsen or seek emergency care  .follow up Dr. Annamaria Boots  In 3 months and As needed  .

## 2015-11-21 NOTE — Assessment & Plan Note (Signed)
Slow to resolve flare  xopenex neb x 1 in office  Depo medrol 120mg  im x 1   Plan  Patient Instructions  Finish Doxycycline and Prednisone .  Mucinex and Delsym As needed  Cough/congestion  Hydromet 1 tsp every 4-6 hr as needed for cough , may make you sleepy.  May try Xopenex neb in place of albuterol .  Please contact office for sooner follow up if symptoms do not improve or worsen or seek emergency care  .follow up Dr. Annamaria Boots  In 3 months and As needed  .

## 2015-11-21 NOTE — Telephone Encounter (Signed)
Called and spoke with pt and she is aware of appt with TP today at 215.  Nothing further is needed.

## 2015-12-12 MED FILL — AMPHETAMINE SALTS 30 MG TAB: 30 | 30 days supply | Qty: 30 | Fill #0

## 2015-12-12 MED FILL — DEXTROAMP-AMPHET ER 30 MG C: 30 | 30 days supply | Qty: 30 | Fill #0

## 2016-01-06 ENCOUNTER — Ambulatory Visit (INDEPENDENT_AMBULATORY_CARE_PROVIDER_SITE_OTHER)

## 2016-01-06 DIAGNOSIS — Z23 Encounter for immunization: Secondary | ICD-10-CM

## 2016-02-09 MED FILL — HYDROCHLOROTHIAZIDE 25 MG T: 25 | 90 days supply | Qty: 90 | Fill #1

## 2016-03-08 ENCOUNTER — Encounter: Payer: Self-pay | Admitting: Internal Medicine

## 2016-03-08 ENCOUNTER — Ambulatory Visit (INDEPENDENT_AMBULATORY_CARE_PROVIDER_SITE_OTHER): Admitting: Internal Medicine

## 2016-03-08 VITALS — BP 112/62 | HR 86

## 2016-03-08 DIAGNOSIS — K219 Gastro-esophageal reflux disease without esophagitis: Secondary | ICD-10-CM | POA: Diagnosis not present

## 2016-03-08 DIAGNOSIS — R918 Other nonspecific abnormal finding of lung field: Secondary | ICD-10-CM

## 2016-03-08 DIAGNOSIS — J449 Chronic obstructive pulmonary disease, unspecified: Secondary | ICD-10-CM | POA: Diagnosis not present

## 2016-03-08 DIAGNOSIS — A310 Pulmonary mycobacterial infection: Secondary | ICD-10-CM

## 2016-03-08 MED ORDER — UMECLIDINIUM-VILANTEROL 62.5-25 MCG/INH IN AEPB: INHALATION_SPRAY | RESPIRATORY_TRACT | 12 refills | Status: DC

## 2016-03-08 MED ORDER — UMECLIDINIUM-VILANTEROL 62.5-25 MCG/INH IN AEPB: 1.0000 | INHALATION_SPRAY | Freq: Every day | RESPIRATORY_TRACT | 0 refills | Status: DC

## 2016-03-08 NOTE — Patient Instructions (Addendum)
Sample and Rx Anoro Ellipta  Inhale 1 puff, once daily       Try this instead of Symbicort  Order- CT chest  High Resolution-    Dx MAIC/ lung nodules RUL  Compare with [facility] 2015  Please call as needed

## 2016-03-08 NOTE — Progress Notes (Signed)
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 MAIC, GERD, complicated by history renal cell CA  CT chest [facility] 12/13/13- Ground glass, tree-in-bud, and micronodular opacities in a bronchovascular pattern involving the right upper lobe are favored to be related to an infectious process. Recommend repeat chest CT after appropriate therapy to document resolution.  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 MAIC, GERD, complicated by hx renal cell CA FOLLOWS FOR:Pt states she has done well overall; would like to discuss her yearly follow up times. Says lungs feel "great" especially since she moved to a new home. She questions if there might of been a respiratory irritant associated with old home but unclear. Perfume of a coworker tightness or chest and she is going to speak to her Freight forwarder. She is not sure she needs Symbicort and we discussed tapering off it with observation for trial. Very rare need for rescue inhaler. CT chest/abdomen 12/13/2013/[facility] (care everywhere)-Ground glass, tree-in-bud, and micronodular opacities in a bronchovascular pattern involving the right upper lobe are favored to be related to an infectious process. Recommend repeat chest CT after appropriate therapy to document resolution. No definite evidence of recurrent neoplasm or metastatic disease.  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 MAIC, GERD, complicated by history renal cell CA FOLLOW FOR: Yearly follow-up- LOV 11/21/15- NP-  acute bronchitis-doxycycline, prednisone, Hydromet, Depo-Medrol Follow-up at that [facility] with yearly chest CT tracking suspected, unproven MAIC Had flu type A March 2017. Acute bronchitis in  August which resolved. Today feels well but admits chronic cough. Very wheezy with exacerbations. Little seasonal rhinitis problem. CXR 06/27/2015 IMPRESSION: No active cardiopulmonary disease.  ROS-see HPI Constitutional:   No-   weight loss, night sweats, fevers, chills, fatigue, lassitude. HEENT:   +headaches, difficulty swallowing, tooth/dental problems, sore throat,       No-  sneezing, itching, ear ache, +nasal congestion, +post nasal drip,  CV:  No-  chest pain, orthopnea, PND, swelling in lower extremities, anasarca, dizziness, palpitations Resp: +shortness of breath with exertion or at rest.             No-productive cough,  + non-productive cough,  No- coughing up of blood.              No-change in color of mucus. No- wheezing.   Skin: No-   rash or lesions. GI:  + heartburn, indigestion, No-abdominal pain, nausea, vomiting,  GU: . MS:  No-   joint pain or swelling.   Neuro-     nothing unusual Psych:  No- change in mood or affect. No depression or anxiety.  No memory loss.  OBJ- Physical Exam General- Alert, Oriented, Affect-appropriate, Distress- none acute. + Overweight. Skin- rash-none, lesions- none, excoriation- none Lymphadenopathy- none Head- atraumatic            Eyes- Gross vision intact, PERRLA, conjunctivae and secretions clear            Ears- clear            Nose- no- turbinate edema, no-Septal dev, mucus, polyps, erosion, perforation             Throat- Mallampati II , mucosa-red , drainage- none, tonsils- atrophic,  Neck- flexible , trachea midline, no stridor , thyroid nl, carotid no bruit Chest - symmetrical excursion , unlabored  Heart/CV- RRR , no murmur , no gallop  , no rub, nl s1 s2                           - JVD- none , edema- none, stasis changes- none, varices- none           Lung- clear unlabored, wheeze- none, cough- none , dullness-none, rub- none           Chest wall-  Abd-  Br/ Gen/ Rectal- Not done, not indicated Extrem-  cyanosis- none, clubbing, none, atrophy- none, strength- nl Neuro- grossly intact to observation

## 2016-03-15 ENCOUNTER — Ambulatory Visit (HOSPITAL_BASED_OUTPATIENT_CLINIC_OR_DEPARTMENT_OTHER)
Admission: RE | Admit: 2016-03-15 | Discharge: 2016-03-15 | Disposition: A | Discharge status: Home / Self Care | Source: Ambulatory Visit | Attending: Internal Medicine | Admitting: Internal Medicine

## 2016-03-15 DIAGNOSIS — R918 Other nonspecific abnormal finding of lung field: Secondary | ICD-10-CM | POA: Insufficient documentation

## 2016-03-15 DIAGNOSIS — A310 Pulmonary mycobacterial infection: Secondary | ICD-10-CM | POA: Diagnosis present

## 2016-03-24 ENCOUNTER — Telehealth: Payer: Self-pay | Admitting: Family Medicine

## 2016-03-24 ENCOUNTER — Encounter: Payer: Self-pay | Admitting: Family Medicine

## 2016-03-24 ENCOUNTER — Ambulatory Visit (INDEPENDENT_AMBULATORY_CARE_PROVIDER_SITE_OTHER): Admitting: Family Medicine

## 2016-03-24 VITALS — BP 124/80 | HR 72 | Temp 97.8°F | Resp 18 | Ht 62.0 in | Wt 190.8 lb

## 2016-03-24 DIAGNOSIS — Z6833 Body mass index (BMI) 33.0-33.9, adult: Secondary | ICD-10-CM

## 2016-03-24 DIAGNOSIS — Z1329 Encounter for screening for other suspected endocrine disorder: Secondary | ICD-10-CM

## 2016-03-24 DIAGNOSIS — Z8719 Personal history of other diseases of the digestive system: Secondary | ICD-10-CM

## 2016-03-24 DIAGNOSIS — I10 Essential (primary) hypertension: Secondary | ICD-10-CM

## 2016-03-24 DIAGNOSIS — R7309 Other abnormal glucose: Secondary | ICD-10-CM | POA: Diagnosis not present

## 2016-03-24 DIAGNOSIS — Z0000 Encounter for general adult medical examination without abnormal findings: Secondary | ICD-10-CM | POA: Diagnosis not present

## 2016-03-24 DIAGNOSIS — E559 Vitamin D deficiency, unspecified: Secondary | ICD-10-CM | POA: Insufficient documentation

## 2016-03-24 DIAGNOSIS — E785 Hyperlipidemia, unspecified: Secondary | ICD-10-CM | POA: Diagnosis not present

## 2016-03-24 DIAGNOSIS — Z1239 Encounter for other screening for malignant neoplasm of breast: Secondary | ICD-10-CM

## 2016-03-24 DIAGNOSIS — E2839 Other primary ovarian failure: Secondary | ICD-10-CM

## 2016-03-24 DIAGNOSIS — Z1231 Encounter for screening mammogram for malignant neoplasm of breast: Secondary | ICD-10-CM | POA: Diagnosis not present

## 2016-03-24 LAB — COMPREHENSIVE METABOLIC PANEL
ALT: 58 U/L — AB (ref 0–35)
AST: 42 U/L — AB (ref 0–37)
Albumin: 4 g/dL (ref 3.5–5.2)
Alkaline Phosphatase: 184 U/L — ABNORMAL HIGH (ref 39–117)
BILIRUBIN TOTAL: 0.5 mg/dL (ref 0.2–1.2)
BUN: 10 mg/dL (ref 6–23)
CO2: 30 meq/L (ref 19–32)
CREATININE: 0.68 mg/dL (ref 0.40–1.20)
Calcium: 9.6 mg/dL (ref 8.4–10.5)
Chloride: 98 mEq/L (ref 96–112)
GFR: 95.32 mL/min (ref >60.00)
GLUCOSE: 106 mg/dL — AB (ref 70–99)
Potassium: 3.6 mEq/L (ref 3.5–5.1)
Sodium: 139 mEq/L (ref 135–145)
Total Protein: 7.3 g/dL (ref 6.0–8.3)

## 2016-03-24 LAB — LIPID PANEL
CHOLESTEROL: 259 mg/dL — AB (ref 0–200)
HDL: 61.6 mg/dL (ref >39.00)
LDL CALC: 169 mg/dL — AB (ref 0–99)
NONHDL: 197.41
Total CHOL/HDL Ratio: 4
Triglycerides: 143 mg/dL (ref 0.0–149.0)
VLDL: 28.6 mg/dL (ref 0.0–40.0)

## 2016-03-24 LAB — CBC WITH DIFFERENTIAL/PLATELET
BASOS ABS: 0.1 10*3/uL (ref 0.0–0.1)
Basophils Relative: 0.8 % (ref 0.0–3.0)
EOS ABS: 0.4 10*3/uL (ref 0.0–0.7)
Eosinophils Relative: 6.3 % — ABNORMAL HIGH (ref 0.0–5.0)
HCT: 40.6 % (ref 36.0–46.0)
Hemoglobin: 13.4 g/dL (ref 12.0–15.0)
LYMPHS ABS: 2.3 10*3/uL (ref 0.7–4.0)
LYMPHS PCT: 33.7 % (ref 12.0–46.0)
MCHC: 33 g/dL (ref 30.0–36.0)
MCV: 86.6 fl (ref 78.0–100.0)
Monocytes Absolute: 0.3 10*3/uL (ref 0.1–1.0)
Monocytes Relative: 5.1 % (ref 3.0–12.0)
NEUTROS ABS: 3.7 10*3/uL (ref 1.4–7.7)
NEUTROS PCT: 54.1 % (ref 43.0–77.0)
PLATELETS: 385 10*3/uL (ref 150.0–400.0)
RBC: 4.69 Mil/uL (ref 3.87–5.11)
RDW: 15.8 % — ABNORMAL HIGH (ref 11.5–15.5)
WBC: 6.9 10*3/uL (ref 4.0–10.5)

## 2016-03-24 LAB — HEMOGLOBIN A1C: Hgb A1c MFr Bld: 5.8 % (ref 4.6–6.5)

## 2016-03-24 LAB — TSH: TSH: 1.42 u[IU]/mL (ref 0.35–4.50)

## 2016-03-24 LAB — VITAMIN D 25 HYDROXY (VIT D DEFICIENCY, FRACTURES): VITD: 19.31 ng/mL — AB (ref 30.00–100.00)

## 2016-03-24 MED ORDER — VITAMIN D (ERGOCALCIFEROL) 1.25 MG (50000 UNIT) PO CAPS: 50000.0000 [IU] | ORAL_CAPSULE | ORAL | 0 refills | Status: DC

## 2016-03-24 MED ORDER — POTASSIUM CHLORIDE CRYS ER 20 MEQ PO TBCR: 20.0000 meq | EXTENDED_RELEASE_TABLET | Freq: Two times a day (BID) | ORAL | 2 refills | Status: DC

## 2016-03-24 MED ORDER — HYDROCHLOROTHIAZIDE 25 MG PO TABS: 25.0000 mg | ORAL_TABLET | Freq: Every day | ORAL | 1 refills | Status: DC

## 2016-03-24 MED FILL — KLOR-CON M20 TABLET: 20 | 90 days supply | Qty: 180 | Fill #0

## 2016-03-24 NOTE — Telephone Encounter (Signed)
Please call patient: - Her CMP/liver enzymes are stable from prior collections.  - her cholesterol is elevated, this can be secondary to her medication. Of course continuing to diet and exercise will help lower her cholesterol, she could also try 1-2 grams of *Fish oil supplementation daily if able to tolerate. Would want to try this before considering other medications for cholesterol secondary to her elevated liver enzymes. - Her vitamin D he is rather low (19), I have called in a once a week supplementation for 12 weeks, she will then use over-the-counter supplementation [phone] units. I would advise she be rechecked at the end of the 12 weeks to make certain that she is well supplemented (can be lab appointment only) order placed. - Her A1c is increasing to 5.8, this is again prediabetes. Will need to recheck in 6 months with a provider appointment.

## 2016-03-24 NOTE — Patient Instructions (Signed)

## 2016-03-24 NOTE — Progress Notes (Signed)
Patient ID: [name], female  DOB: [birth year], 55 y.o.   MRN: [id number] Patient Care Team    Relationship Specialty Notifications Start End  Betty Hillock, DO PCP - General Family Medicine  03/05/15     Subjective:  [name] is a 55 y.o.  Female  present for CPE. All past medical history, surgical history, allergies, family history, immunizations, medications and social history were updated in the electronic medical record today. All recent labs, ED visits and hospitalizations within the last year were reviewed.  Health maintenance:  Colonoscopy: No family history. Last colonoscopy 01/2016, follow-up every 5 years or colitis.  Mammogram: No family history. Last mammogram 2017, Birads 1.mammogram ordered today. 04/2016 scheduled. Breast center.  Cervical cancer screening: Last Pap 3 years ago, normal, follow every 3-5 yr. HPV neg. Immunizations: Tdap 03/2015 UTD,  flu shot UTD 2017. PNA series completed.  Infectious disease screening: HIV and Hep C completed.  DEXA: Screening to start at age 70, post meno, colitis. Will order screen today.  Assistive device: None Oxygen use: None Patient has a Dental home. Hospitalizations/ED visits: Reviewed gastroenterology visits  Depression screen [facility] 2/9 03/24/2016  Decreased Interest 0  Down, Depressed, Hopeless 0  PHQ - 2 Score 0     Immunization History  Administered Date(s) Administered  . Influenza Split 01/24/2012, 01/06/2013, 01/03/2014, 01/15/2015  . Influenza,inj,Quad PF,36+ Mos 01/06/2016  . Pneumococcal Conjugate-13 03/24/2015  . Pneumococcal Polysaccharide-23 12/17/2013  . Tdap 03/24/2015    Past Medical History:  Diagnosis Date  . Anxiety    psychiatrist: Dr. Robina Ade   . Arthritis   . Colitis   . Colon polyps   . Depression    psychiatrist: Dr. Robina Ade  . Elevated liver enzymes    Dr. Rulon Abide following  . Fatty liver   . GERD (gastroesophageal reflux disease)   . Heart murmur   . History  of chicken pox   . Renal cell carcinoma    hx of left and right; Dr. Nena Alexander   Allergies  Allergen Reactions  . Codeine Itching   Past Surgical History:  Procedure Laterality Date  . CHOLECYSTECTOMY    . FETAL RADIO FREQUENCY ABLATION    . FRACTURE SURGERY Right    ankle  . JOINT REPLACEMENT  2009   knee  . NEPHRECTOMY     partial LT  . TONSILLECTOMY     Family History  Problem Relation Age of Onset  . Hypertension Mother   . Alzheimer's disease Mother   . Arthritis Mother   . Hypertension Father   . Alzheimer's disease Father   . COPD Father   . Heart disease Father   . Arthritis Father   . Hypertension Sister    Social History   Social History  . Marital status: Married    Spouse name: N/A  . Number of children: N/A  . Years of education: N/A   Occupational History  . Not on file.   Social History Main Topics  . Smoking status: Never Smoker  . Smokeless tobacco: Never Used  . Alcohol use No  . Drug use: No  . Sexual activity: Yes   Other Topics Concern  . Not on file   Social History Narrative   Married. 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   Lives with her husband, son and granddaughter.   Drinks caffeinated beverages.   Wears her seatbelt, wears a bicycle helmet, there is a  smoke detector in home. There are no firearms in the home.   Patient feels safe in her relationships.   Allergies as of 03/24/2016      Reactions   Codeine Itching      Medication List       Accurate as of 03/24/16  2:48 PM. Always use your most recent med list.          ALPRAZolam 0.5 MG 24 hr tablet Commonly known as:  XANAX XR Take 0.5 mg by mouth daily. 2 tabs q AM   amphetamine-dextroamphetamine 20 MG tablet Commonly known as:  ADDERALL Take 20 mg by mouth 3 (three) times daily.   budesonide-formoterol 160-4.5 MCG/ACT inhaler Commonly known as:  SYMBICORT Inhale 2 puffs into the lungs 2 (two) times daily. Rinse after  each use   hydrochlorothiazide 25 MG tablet Commonly known as:  HYDRODIURIL Take 1 tablet (25 mg total) by mouth daily.   levalbuterol 0.63 MG/3ML nebulizer solution Commonly known as:  XOPENEX Take 3 mLs (0.63 mg total) by nebulization every 6 (six) hours as needed for wheezing or shortness of breath.   levalbuterol 45 MCG/ACT inhaler Commonly known as:  XOPENEX HFA Inhale 2 puffs into the lungs every 4 (four) hours as needed for wheezing or shortness of breath.   NEXIUM 40 MG capsule Generic drug:  esomeprazole TAKE 1 CAPSULE BY MOUTH ONCE DAILY BEFORE BREAKFAST   OLANZapine-FLUoxetine 3-25 MG capsule Commonly known as:  SYMBYAX Take 1 capsule by mouth every evening.   potassium chloride SA 20 MEQ tablet Commonly known as:  K-DUR,KLOR-CON Take 1 tablet (20 mEq total) by mouth 2 (two) times daily.   RESTASIS 0.05 % ophthalmic emulsion Generic drug:  cycloSPORINE Place 1 drop into both eyes 2 (two) times daily.   TRINTELLIX 20 MG Tabs Generic drug:  vortioxetine HBr Take 20 mg by mouth daily.   umeclidinium-vilanterol 62.5-25 MCG/INH Aepb Commonly known as:  ANORO ELLIPTA Inhale 1 puff daily   umeclidinium-vilanterol 62.5-25 MCG/INH Aepb Commonly known as:  ANORO ELLIPTA Inhale 1 puff into the lungs daily.        No results found for this or any previous visit (from the past 2160 hour(s)).  Ct Chest High Resolution  Result Date: 03/15/2016 CLINICAL DATA:  Lung nodules, renal cell carcinoma. Mycobacterium avium complex. EXAM: CT CHEST WITHOUT CONTRAST TECHNIQUE: Multidetector CT imaging of the chest was performed following the standard protocol without intravenous contrast. High resolution imaging of the lungs, as well as inspiratory and expiratory imaging, was performed. COMPARISON:  None. FINDINGS: Cardiovascular: Minimal atherosclerotic calcification of the aorta. Heart size normal. No pericardial effusion. Mediastinum/Nodes: No pathologically enlarged mediastinal  or axillary lymph nodes. Hilar regions are difficult to definitively evaluate without IV contrast. Esophagus is grossly unremarkable. Lungs/Pleura: No subpleural reticulation, traction bronchiectasis/ bronchiolectasis, ground-glass, architectural distortion or honeycombing. A few scattered pulmonary nodules measure up to 5 mm. No pleural fluid. Airway is unremarkable. No air trapping. Upper Abdomen: Visualized portions of the liver, adrenal glands, kidneys, spleen, pancreas, stomach and bowel are grossly unremarkable. Cholecystectomy. No upper abdominal adenopathy. Musculoskeletal: No worrisome lytic or sclerotic lesions. Degenerative changes are seen in the spine. IMPRESSION: 1. No evidence of interstitial lung disease or mycobacterium avium complex. 2. Scattered pulmonary nodules measure 5 mm or less in size. No follow-up needed if patient is low-risk (and has no known or suspected primary neoplasm). Non-contrast chest CT can be considered in 12 months if patient is high-risk. This recommendation follows the consensus statement: Guidelines  for Management of Incidental Pulmonary Nodules Detected on CT Images: From the Fleischner Society 2017; Radiology 2017; [phone]. Electronically Signed   By: Lorin Picket M.D.   On: 03/15/2016 12:30     ROS: 14 pt review of systems performed and negative (unless mentioned in an HPI)  Objective: BP 124/80 (BP Location: Right Arm, Patient Position: Sitting, Cuff Size: Large)   Pulse 72   Temp 97.8 F (36.6 C)   Resp 18   Ht 5' 2"  (1.575 m)   Wt 190 lb 12 oz (86.5 kg)   LMP 04/03/2013   SpO2 99%   BMI 34.89 kg/m  Gen: Afebrile. No acute distress. Nontoxic in appearance, well-developed, well-nourished,  pleasnat caucasian female.  HENT: AT. [place]. Bilateral TM visualized and normal in appearance, normal external auditory canal. MMM, no oral lesions, adequate dentition. Bilateral nares within normal limits. Throat without erythema, ulcerations or exudates. no  Cough on exam, no hoarseness on exam. Eyes:Pupils Equal Round Reactive to light, Extraocular movements intact,  Conjunctiva without redness, discharge or icterus. Neck/lymp/endocrine: Supple,no lymphadenopathy, no thyromegaly CV: RRR no murmur, no edema, +2/4 P posterior tibialis pulses. no carotid bruits. No JVD. Chest: CTAB, no wheeze, rhonchi or crackles. Normal  Respiratory effort. good Air movement. Abd: Soft. obese. NTND. BS present. no Masses palpated. No hepatosplenomegaly. No rebound tenderness or guarding. Skin: no rashes, purpura or petechiae. Warm and well-perfused. Skin intact. Neuro/Msk:  Normal gait. PERLA. EOMi. Alert. Oriented x3.  Cranial nerves II through XII intact. Muscle strength 5/5 upper/lower extremity. DTRs equal bilaterally. Psych: Normal affect, dress and demeanor. Normal speech. Normal thought content and judgment.   Assessment/plan: Tequita Marrs is a 55 y.o. female present for CPE. Encounter for preventive health examination BMI 33.0-33.9,adult Patient was encouraged to exercise greater than 150 minutes a week. Patient was encouraged to choose a diet filled with fresh fruits and vegetables, and lean meats. AVS provided to patient today for education/recommendation on gender specific health and safety maintenance. Colonoscopy: No family history. Last colonoscopy 01/2016, follow-up every 5 years or colitis.  Mammogram: No family history. Last mammogram 2017, Birads 1.mammogram ordered today. 04/2016 scheduled. Breast center.  Cervical cancer screening: Last Pap 3 years ago, normal, follow every 3-5 yr. HPV neg. Immunizations: Tdap 03/2015 UTD,  flu shot UTD 2017. PNA series completed.  Infectious disease screening: HIV and Hep C completed.  DEXA: Screening to start at age 85, post meno, colitis. Will order screen today. Breast cancer screening - MM DIGITAL SCREENING BILATERAL; Future Elevated hemoglobin A1c - HgB A1c History of colitis - Vitamin D (25  hydroxy) - TSH - HgB A1c - Lipid panel Hyperlipidemia, unspecified hyperlipidemia type - Lipid panel Essential hypertension, benign - CBC w/Diff - Comp Met (CMET) - hydrochlorothiazide (HYDRODIURIL) 25 MG tablet; Take 1 tablet (25 mg total) by mouth daily.  Dispense: 90 tablet; Refill: 1 - potassium chloride SA (K-DUR,KLOR-CON) 20 MEQ tablet; Take 1 tablet (20 mEq total) by mouth 2 (two) times daily.  Dispense: 180 tablet; Refill: 2 Estrogen deficiency - DG Bone Density; Future    Return in about 1 year (around 03/24/2017) for CPE.  Electronically signed by: Howard Pouch, DO [place]

## 2016-03-25 ENCOUNTER — Telehealth: Payer: Self-pay | Admitting: Family Medicine

## 2016-03-25 MED FILL — VIT D2 1.25 MG (50,000 UNIT: 1.25 MG | 84 days supply | Qty: 12 | Fill #0

## 2016-03-25 NOTE — Telephone Encounter (Signed)
Patient would like to get a hard copy of her lab results mailed to her. Thank you

## 2016-03-25 NOTE — Telephone Encounter (Signed)
Spoke with patient reviewed lab results and instructions. Patient verbalized understanding. 

## 2016-03-26 NOTE — Telephone Encounter (Signed)
Copy of labs mailed to patient.

## 2016-04-27 ENCOUNTER — Telehealth: Payer: Self-pay | Admitting: Internal Medicine

## 2016-04-27 MED ORDER — PREDNISONE 10 MG PO TABS: ORAL_TABLET | ORAL | 0 refills | Status: DC

## 2016-04-27 MED ORDER — AZITHROMYCIN 250 MG PO TABS: ORAL_TABLET | ORAL | 0 refills | Status: AC

## 2016-04-27 NOTE — Telephone Encounter (Signed)
Spoke with pt, who states last week she went on a family vacation to [place]. Pt states during her flight home, there were a lot of people coughing & sneezing. Pt has developed chest/head congestion, prod cough with green mucus & post nasal drip and is concerned that she is developing bronchitis X 2d  Pt denies any fever, chills or sweats. Pt is requesting an abx & prednisone to be sent in. Pt taking mucinex with no relief.  CY please advise. Thanks.   Current Outpatient Prescriptions on File Prior to Visit  Medication Sig Dispense Refill  . ALPRAZolam (XANAX XR) 0.5 MG 24 hr tablet Take 0.5 mg by mouth daily. 2 tabs q AM    . amphetamine-dextroamphetamine (ADDERALL) 20 MG tablet Take 20 mg by mouth 3 (three) times daily.    . budesonide-formoterol (SYMBICORT) 160-4.5 MCG/ACT inhaler Inhale 2 puffs into the lungs 2 (two) times daily. Rinse after each use 3 Inhaler 3  . hydrochlorothiazide (HYDRODIURIL) 25 MG tablet Take 1 tablet (25 mg total) by mouth daily. 90 tablet 1  . levalbuterol (XOPENEX HFA) 45 MCG/ACT inhaler Inhale 2 puffs into the lungs every 4 (four) hours as needed for wheezing or shortness of breath. 1 Inhaler 5  . levalbuterol (XOPENEX) 0.63 MG/3ML nebulizer solution Take 3 mLs (0.63 mg total) by nebulization every 6 (six) hours as needed for wheezing or shortness of breath. 75 mL 5  . NEXIUM 40 MG capsule TAKE 1 CAPSULE BY MOUTH ONCE DAILY BEFORE BREAKFAST 30 capsule 2  . OLANZapine-FLUoxetine (SYMBYAX) 3-25 MG capsule Take 1 capsule by mouth every evening.     . potassium chloride SA (K-DUR,KLOR-CON) 20 MEQ tablet Take 1 tablet (20 mEq total) by mouth 2 (two) times daily. 180 tablet 2  . RESTASIS 0.05 % ophthalmic emulsion Place 1 drop into both eyes 2 (two) times daily.   3  . umeclidinium-vilanterol (ANORO ELLIPTA) 62.5-25 MCG/INH AEPB Inhale 1 puff daily 60 each 12  . umeclidinium-vilanterol (ANORO ELLIPTA) 62.5-25 MCG/INH AEPB Inhale 1 puff into the lungs daily. 1 each 0   . Vitamin D, Ergocalciferol, (DRISDOL) 50000 units CAPS capsule Take 1 capsule (50,000 Units total) by mouth every 7 (seven) days. 12 capsule 0  . Vortioxetine HBr (TRINTELLIX) 20 MG TABS Take 20 mg by mouth daily.     No current facility-administered medications on file prior to visit.     Allergies  Allergen Reactions  . Codeine Itching

## 2016-04-27 NOTE — Telephone Encounter (Signed)
Offer prednisone 10 mg, # 20, 4 X 2 DAYS, 3 X 2 DAYS, 2 X 2 DAYS, 1 X 2 DAYS to use if needed           Zpak  250 mg, # 6, 2 today then one daily

## 2016-04-27 NOTE — Telephone Encounter (Signed)
Pt aware of CY's recommendations & voiced her understanding. Rx sent to preferred pharmacy. Nothing further needed.  

## 2016-05-06 ENCOUNTER — Telehealth: Payer: Self-pay | Admitting: Family Medicine

## 2016-05-06 NOTE — Telephone Encounter (Signed)
Hydrochlorothiazide 1 month supply [facility] . 90 day supply to [facility]. She only has enough medication to make it through Sunday. Please make sure all future refills go to [facility], Thanks

## 2016-05-07 ENCOUNTER — Other Ambulatory Visit: Payer: Self-pay | Admitting: *Deleted

## 2016-05-07 DIAGNOSIS — I10 Essential (primary) hypertension: Secondary | ICD-10-CM

## 2016-05-07 MED ORDER — HYDROCHLOROTHIAZIDE 25 MG PO TABS: 25.0000 mg | ORAL_TABLET | Freq: Every day | ORAL | 0 refills | Status: DC

## 2016-05-07 NOTE — Telephone Encounter (Signed)
HCTZ refilled. 

## 2016-05-13 ENCOUNTER — Ambulatory Visit
Admission: RE | Admit: 2016-05-13 | Discharge: 2016-05-13 | Disposition: A | Discharge status: Home / Self Care | Source: Ambulatory Visit | Attending: Family Medicine | Admitting: Family Medicine

## 2016-05-13 DIAGNOSIS — E2839 Other primary ovarian failure: Secondary | ICD-10-CM

## 2016-05-13 DIAGNOSIS — Z1239 Encounter for other screening for malignant neoplasm of breast: Secondary | ICD-10-CM

## 2016-05-14 ENCOUNTER — Telehealth: Payer: Self-pay | Admitting: Family Medicine

## 2016-05-14 DIAGNOSIS — M8580 Other specified disorders of bone density and structure, unspecified site: Secondary | ICD-10-CM | POA: Insufficient documentation

## 2016-05-14 DIAGNOSIS — M85851 Other specified disorders of bone density and structure, right thigh: Secondary | ICD-10-CM | POA: Insufficient documentation

## 2016-05-14 NOTE — Telephone Encounter (Signed)
Left message for patient to return call.

## 2016-05-14 NOTE — Telephone Encounter (Signed)
Please call pt: - Her bone density scan showed osteopenia (bone softening).  - maintaining bone health by adequate vit d and calcium supplementation. Weight bearing exercise.  - The medications available for osteopenia are mostly contraindicated in people with bad reflux (it can make worse). There is an IV format that is every two years for prevention, that has to be given in the hospital (sameday), but her condition is not quite to that point.  - Her last vit d was 03/24/2016 and she prescribed vit d,. I recommend she have the repeat lab 12 week after starting the Vit d with a provider appt to follow 2 days later, so we can discuss further recs and plan.  (please enter vit d repeat if not already in)

## 2016-05-17 NOTE — Telephone Encounter (Signed)
Spoke with patient reviewed results and instructions with patient . Patient has scheduled lab and office visit.

## 2016-06-18 ENCOUNTER — Other Ambulatory Visit (INDEPENDENT_AMBULATORY_CARE_PROVIDER_SITE_OTHER)

## 2016-06-18 DIAGNOSIS — E559 Vitamin D deficiency, unspecified: Secondary | ICD-10-CM

## 2016-06-18 LAB — VITAMIN D 25 HYDROXY (VIT D DEFICIENCY, FRACTURES): VITD: 41.4 ng/mL (ref 30.00–100.00)

## 2016-06-21 ENCOUNTER — Ambulatory Visit (INDEPENDENT_AMBULATORY_CARE_PROVIDER_SITE_OTHER): Admitting: Family Medicine

## 2016-06-21 ENCOUNTER — Encounter: Payer: Self-pay | Admitting: Family Medicine

## 2016-06-21 VITALS — BP 117/76 | HR 80 | Temp 98.2°F | Resp 20 | Ht 62.0 in | Wt 193.5 lb

## 2016-06-21 DIAGNOSIS — E559 Vitamin D deficiency, unspecified: Secondary | ICD-10-CM

## 2016-06-21 DIAGNOSIS — M8580 Other specified disorders of bone density and structure, unspecified site: Secondary | ICD-10-CM

## 2016-06-21 NOTE — Progress Notes (Signed)
[name] , [birth year], 56 y.o., female MRN: [id number] Patient Care Team    Relationship Specialty Notifications Start End  Ma Hillock, DO PCP - General Family Medicine  03/05/15     CC: osteopenia/Vit d deficiency Subjective:   Osteopenia/Vit D deficiency: pt present for follow up on vit d deficiency and osteopenia by DEXA last month. She has finished her vit d supplementation prescribed and has continued the 5000u PO daily. Her DEXA resulted with -1.5 density right femur and -1.3 lumbar spine. She has continued walking daily. She has severe GERD and would not be a candidate for bisphos. Vit D was 19 in December.   DEXA 05/13/2016:  ASSESSMENT: The BMD measured at Femur Neck Right is 0.829 g/cm2 with a T-score of -1.5. This patient is considered osteopenic according to [facility]) criteria.  Site Region Measured Date Measured Age YA BMD Significant CHANGE T-score DualFemur Neck Right 05/13/2016    55.5         -1.5    0.829 g/cm2  AP Spine  L1-L4      05/13/2016    55.5         -1.3    1.035 g/cm2   Depression screen PHQ 2/9 03/24/2016  Decreased Interest 0  Down, Depressed, Hopeless 0  PHQ - 2 Score 0    Allergies  Allergen Reactions  . Codeine Itching   Social History  Substance Use Topics  . Smoking status: Never Smoker  . Smokeless tobacco: Never Used  . Alcohol use No   Past Medical History:  Diagnosis Date  . Anxiety    psychiatrist: Dr. Robina Ade   . Arthritis   . Colitis   . Colon polyps   . Depression    psychiatrist: Dr. Robina Ade  . Elevated liver enzymes    Dr. Rulon Abide following  . Fatty liver   . GERD (gastroesophageal reflux disease)   . Heart murmur   . History of chicken pox   . Renal cell carcinoma    hx of left and right; Dr. Nena Alexander   Past Surgical History:  Procedure Laterality Date  . CHOLECYSTECTOMY    . FETAL RADIO FREQUENCY ABLATION    . FRACTURE SURGERY Right    ankle  . JOINT REPLACEMENT  2009   knee  . NEPHRECTOMY     partial LT  . TONSILLECTOMY     Family History  Problem Relation Age of Onset  . Hypertension Mother   . Alzheimer's disease Mother   . Arthritis Mother   . Hypertension Father   . Alzheimer's disease Father   . COPD Father   . Heart disease Father   . Arthritis Father   . Hypertension Sister    Allergies as of 06/21/2016      Reactions   Codeine Itching      Medication List       Accurate as of 06/21/16  8:17 AM. Always use your most recent med list.          ALPRAZolam 0.5 MG 24 hr tablet Commonly known as:  XANAX XR Take 0.5 mg by mouth daily. 2 tabs q AM   amphetamine-dextroamphetamine 20 MG tablet Commonly known as:  ADDERALL Take 20 mg by mouth 3 (three) times daily.   budesonide-formoterol 160-4.5 MCG/ACT inhaler Commonly known as:  SYMBICORT Inhale 2 puffs into the lungs 2 (two) times daily. Rinse after each use   hydrochlorothiazide 25 MG tablet Commonly known as:  HYDRODIURIL  Take 1 tablet (25 mg total) by mouth daily.   levalbuterol 0.63 MG/3ML nebulizer solution Commonly known as:  XOPENEX Take 3 mLs (0.63 mg total) by nebulization every 6 (six) hours as needed for wheezing or shortness of breath.   levalbuterol 45 MCG/ACT inhaler Commonly known as:  XOPENEX HFA Inhale 2 puffs into the lungs every 4 (four) hours as needed for wheezing or shortness of breath.   NEXIUM 40 MG capsule Generic drug:  esomeprazole TAKE 1 CAPSULE BY MOUTH ONCE DAILY BEFORE BREAKFAST   OLANZapine-FLUoxetine 3-25 MG capsule Commonly known as:  SYMBYAX Take 1 capsule by mouth every evening.   potassium chloride SA 20 MEQ tablet Commonly known as:  K-DUR,KLOR-CON Take 1 tablet (20 mEq total) by mouth 2 (two) times daily.   RESTASIS 0.05 % ophthalmic emulsion Generic drug:  cycloSPORINE Place 1 drop into both eyes 2 (two) times daily.   TRINTELLIX 20 MG Tabs Generic drug:  vortioxetine HBr Take 20 mg by mouth daily.     umeclidinium-vilanterol 62.5-25 MCG/INH Aepb Commonly known as:  ANORO ELLIPTA Inhale 1 puff into the lungs daily.   Vitamin D-3 5000 units Tabs Take 5,000 Units by mouth daily.       No results found for this or any previous visit (from the past 24 hour(s)). No results found.   ROS: Negative, with the exception of above mentioned in HPI   Objective:  BP 117/76 (BP Location: Right Arm, Patient Position: Sitting, Cuff Size: Normal)   Pulse 80   Temp 98.2 F (36.8 C)   Resp 20   Ht 5\' 2"  (1.575 m)   Wt 193 lb 8 oz (87.8 kg)   LMP 04/22/2013   SpO2 97%   BMI 35.39 kg/m  Body mass index is 35.39 kg/m. Gen: Afebrile. No acute distress. Nontoxic in appearance, well developed, well nourished.  HENT: AT. [place].MMM Eyes:Pupils Equal Round Reactive to light, Extraocular movements intact,  Conjunctiva without redness, discharge or icterus. Neuro: Normal gait.  Alert. Oriented x3  Psych: Normal affect, dress and demeanor. Normal speech. Normal thought content and judgment.  Assessment/Plan: [name] is a 56 y.o. female present for f/u OV for  Vitamin D deficiency Osteopenia, unspecified location - Vit d much improved and normal (41).  - continue Vit D OTC 5000u daily with a meal.  - discussed prescribed management options with her GERD and contraindication with Bisphos.  - Rpt DEXA in 3 years.  - continue weight bearing exercises.  - will follow Vit D yearly at [place] expectations re: course of current medical issues.  Discussed self-management of symptoms.  Outlined signs and symptoms indicating need for more acute intervention.  Patient verbalized understanding and all questions were answered.  Patient received an After-Visit Summary.   electronically signed by:  Howard Pouch, DO  [place]

## 2016-06-21 NOTE — Patient Instructions (Signed)
Continue walking and Vit D supplementation.    Osteoporosis Osteoporosis is the thinning and loss of density in the bones. Osteoporosis makes the bones more brittle, fragile, and likely to break (fracture). Over time, osteoporosis can cause the bones to become so weak that they fracture after a simple fall. The bones most likely to fracture are the bones in the hip, wrist, and spine. What are the causes? The exact cause is not known. What increases the risk? Anyone can develop osteoporosis. You may be at greater risk if you have a family history of the condition or have poor nutrition. You may also have a higher risk if you are:  Female.  21 years old or older.  A smoker.  Not physically active.  White or Asian.  Slender. What are the signs or symptoms? A fracture might be the first sign of the disease, especially if it results from a fall or injury that would not usually cause a bone to break. Other signs and symptoms include:  Low back and neck pain.  Stooped posture.  Height loss. How is this diagnosed? To make a diagnosis, your health care provider may:  Take a medical history.  Perform a physical exam.  Order tests, such as:  A bone mineral density test.  A dual-energy X-ray absorptiometry test. How is this treated? The goal of osteoporosis treatment is to strengthen your bones to reduce your risk of a fracture. Treatment may involve:  Making lifestyle changes, such as:  Eating a diet rich in calcium.  Doing weight-bearing and muscle-strengthening exercises.  Stopping tobacco use.  Limiting alcohol intake.  Taking medicine to slow the process of bone loss or to increase bone density.  Monitoring your levels of calcium and vitamin D. Follow these instructions at home:  Include calcium and vitamin D in your diet. Calcium is important for bone health, and vitamin D helps the body absorb calcium.  Perform weight-bearing and muscle-strengthening exercises  as directed by your health care provider.  Do not use any tobacco products, including cigarettes, chewing tobacco, and electronic cigarettes. If you need help quitting, ask your health care provider.  Limit your alcohol intake.  Take medicines only as directed by your health care provider.  Keep all follow-up visits as directed by your health care provider. This is important.  Take precautions at home to lower your risk of falling, such as:  Keeping rooms well lit and clutter free.  Installing safety rails on stairs.  Using rubber mats in the bathroom and other areas that are often wet or slippery. Get help right away if: You fall or injure yourself. This information is not intended to replace advice given to you by your health care provider. Make sure you discuss any questions you have with your health care provider. Document Released: 12/30/2004 Document Revised: 08/25/2015 Document Reviewed: 08/30/2013 Elsevier Interactive Patient Education  2017 [employer].

## 2016-08-02 ENCOUNTER — Other Ambulatory Visit: Payer: Self-pay | Admitting: Family Medicine

## 2016-08-02 DIAGNOSIS — I10 Essential (primary) hypertension: Secondary | ICD-10-CM

## 2016-08-15 DIAGNOSIS — J449 Chronic obstructive pulmonary disease, unspecified: Secondary | ICD-10-CM | POA: Insufficient documentation

## 2016-08-15 NOTE — Assessment & Plan Note (Addendum)
Without exacerbation at this time. Will need to watch for atypical AFB infection. Plan-CT chest without contrast to compare with the 2015 study. Change S[employer] to [employer]

## 2016-08-15 NOTE — Assessment & Plan Note (Signed)
Again explained potential contribution of low-grade recurrent aspiration to chronic cough and emphasized reflux precautions.

## 2016-08-16 ENCOUNTER — Telehealth: Payer: Self-pay | Admitting: Internal Medicine

## 2016-08-16 MED ORDER — AZITHROMYCIN 250 MG PO TABS: ORAL_TABLET | ORAL | 0 refills | Status: AC

## 2016-08-16 MED ORDER — ALBUTEROL SULFATE HFA 108 (90 BASE) MCG/ACT IN AERS: 2.0000 | INHALATION_SPRAY | Freq: Four times a day (QID) | RESPIRATORY_TRACT | 2 refills | Status: DC | PRN

## 2016-08-16 MED ORDER — PREDNISONE 20 MG PO TABS: ORAL_TABLET | ORAL | 0 refills | Status: DC

## 2016-08-16 NOTE — Telephone Encounter (Signed)
Pt reports of non prod cough, itchy throat, wheezing, nasal drainage green in color, and voice hoarseness x1w Pt states she has been doing neb treatment q6h with mild improvement. Pt states she does not feel that Xopenex helps as well as ventolin, pt is requesting Rx for ventolin. Rx for Ventolin has been sent to preferred pharmacy. Pt states previously CY has prescribed Zpak and Prednisone with improvement.   JN please advise, as CY is unavailable. Thanks.

## 2016-08-16 NOTE — Telephone Encounter (Signed)
Patient is also out of albuterol inhaler and needs this called in also which helping with her wheezing.

## 2016-08-16 NOTE — Telephone Encounter (Signed)
Go ahead and send in a Z-pak and Prednisone 40mg  daily x4 days. Thanks.

## 2016-08-16 NOTE — Telephone Encounter (Signed)
Pt is aware of JN's recommendations and voiced her understanding. Rx has been sent to preferred pharmacy. Nothing further needed.

## 2016-09-21 ENCOUNTER — Other Ambulatory Visit: Payer: Self-pay | Admitting: Internal Medicine

## 2016-09-24 ENCOUNTER — Ambulatory Visit: Admitting: Family Medicine

## 2016-10-09 ENCOUNTER — Other Ambulatory Visit: Payer: Self-pay | Admitting: Family Medicine

## 2016-10-09 DIAGNOSIS — I10 Essential (primary) hypertension: Secondary | ICD-10-CM

## 2016-10-11 ENCOUNTER — Telehealth: Payer: Self-pay | Admitting: *Deleted

## 2016-10-11 NOTE — Telephone Encounter (Signed)
Spoke with patient regarding refill on her HCTZ she states she thinks she has enough to get her through until her appt on 10/25/16. If she runs short she will call and let us know so we can call in a small amount to local pharmacy to get her through per Dr Raoul Pitch.

## 2016-10-25 ENCOUNTER — Encounter: Payer: Self-pay | Admitting: Family Medicine

## 2016-10-25 ENCOUNTER — Ambulatory Visit (INDEPENDENT_AMBULATORY_CARE_PROVIDER_SITE_OTHER): Admitting: Family Medicine

## 2016-10-25 DIAGNOSIS — I10 Essential (primary) hypertension: Secondary | ICD-10-CM | POA: Diagnosis not present

## 2016-10-25 MED ORDER — HYDROCHLOROTHIAZIDE 25 MG PO TABS: 25.0000 mg | ORAL_TABLET | Freq: Every day | ORAL | 1 refills | Status: DC

## 2016-10-25 NOTE — Patient Instructions (Signed)
Continue HCTZ 25 mg a day, refills provided.  Start fish oil supplement and baby ASA (if can tolerate). Continue exercising and making dietary modifications.  F/u 6 months on HTN.   If you need the referral placed, please call in next week and I will be happy to place for you.    Please help [place] help you:  We are honored you have chosen [facility] for your Primary Care home. Below you will find basic instructions that you may need to access in the future. Please help [place] help you by reading the instructions, which cover many of the frequent questions we experience.   Prescription refills and request:  -In order to allow more efficient response time, please call your pharmacy for all refills. They will forward the request electronically to [place]. This allows for the quickest possible response. Request left on a nurse line can take longer to refill, since these are checked as time allows between office patients and other phone calls.  - refill request can take up to 3-5 working days to complete.  - If request is sent electronically and request is appropiate, it is usually completed in 1-2 business days.  - all patients will need to be seen routinely for all chronic medical conditions requiring prescription medications (see follow-up below). If you are overdue for follow up on your condition, you will be asked to make an appointment and we will call in enough medication to cover you until your appointment (up to 30 days).  - all controlled substances will require a face to face visit to request/refill.  - if you desire your prescriptions to go through a new pharmacy, and have an active script at original pharmacy, you will need to call your pharmacy and have scripts transferred to new pharmacy. This is completed between the pharmacy locations and not by your provider.    Results: If any images or labs were ordered, it can take up to 1 week to get results depending on the test ordered and the  lab/facility running and resulting the test. - Normal or stable results, which do not need further discussion, may be released to your mychart immediately with attached note to you. A call may not be generated for normal results. Please make certain to sign up for mychart. If you have questions on how to activate your mychart you can call the front office.  - If your results need further discussion, our office will attempt to contact you via phone, and if unable to reach you after 2 attempts, we will release your abnormal result to your mychart with instructions.  - All results will be automatically released in mychart after 1 week.  - Your provider will provide you with explanation and instruction on all relevant material in your results. Please keep in mind, results and labs may appear confusing or abnormal to the untrained eye, but it does not mean they are actually abnormal for you personally. If you have any questions about your results that are not covered, or you desire more detailed explanation than what was provided, you should make an appointment with your provider to do so.   Our office handles many outgoing and incoming calls daily. If we have not contacted you within 1 week about your results, please check your mychart to see if there is a message first and if not, then contact our office.  In helping with this matter, you help decrease call volume, and therefore allow [place] to be able  to respond to patients needs more efficiently.   Acute office visits (sick visit):  An acute visit is intended for a new problem and are scheduled in shorter time slots to allow schedule openings for patients with new problems. This is the appropriate visit to discuss a new problem. In order to provide you with excellent quality medical care with proper time for you to explain your problem, have an exam and receive treatment with instructions, these appointments should be limited to one new problem per visit. If you  experience a new problem, in which you desire to be addressed, please make an acute office visit, we save openings on the schedule to accommodate you. Please do not save your new problem for any other type of visit, let us take care of it properly and quickly for you.   Follow up visits:  Depending on your condition(s) your provider will need to see you routinely in order to provide you with quality care and prescribe medication(s). Most chronic conditions (Example: hypertension, Diabetes, depression/anxiety... etc), require visits a couple times a year. Your provider will instruct you on proper follow up for your personal medical conditions and history. Please make certain to make follow up appointments for your condition as instructed. Failing to do so could result in lapse in your medication treatment/refills. If you request a refill, and are overdue to be seen on a condition, we will always provide you with a 30 day script (once) to allow you time to schedule.    Medicare wellness (well visit): - we have a wonderful Nurse Maudie Mercury), that will meet with you and provide you will yearly medicare wellness visits. These visits should occur yearly (can not be scheduled less than 1 calendar year apart) and cover preventive health, immunizations, advance directives and screenings you are entitled to yearly through your medicare benefits. Do not miss out on your entitled benefits, this is when medicare will pay for these benefits to be ordered for you.  These are strongly encouraged by your provider and is the appropriate type of visit to make certain you are up to date with all preventive health benefits. If you have not had your medicare wellness exam in the last 12 months, please make certain to schedule one by calling the office and schedule your medicare wellness with Maudie Mercury as soon as possible.   Yearly physical (well visit):  - Adults are recommended to be seen yearly for physicals. Check with your insurance  and date of your last physical, most insurances require one calendar year between physicals. Physicals include all preventive health topics, screenings, medical exam and labs that are appropriate for gender/age and history. You may have fasting labs needed at this visit. This is a well visit (not a sick visit), new problems should not be covered during this visit (see acute visit).  - Pediatric patients are seen more frequently when they are younger. Your provider will advise you on well child visit timing that is appropriate for your their age. - This is not a medicare wellness visit. Medicare wellness exams do not have an exam portion to the visit. Some medicare companies allow for a physical, some do not allow a yearly physical. If your medicare allows a yearly physical you can schedule the medicare wellness with our nurse Maudie Mercury and have your physical with your provider after, on the same day. Please check with insurance for your full benefits.   Late Policy/No Shows:  - all new patients should arrive 15-30 minutes  earlier than appointment to allow [place] time  to  obtain all personal demographics,  insurance information and for you to complete office paperwork. - All established patients should arrive 10-15 minutes earlier than appointment time to update all information and be checked in .  - In our best efforts to run on time, if you are late for your appointment you will be asked to either reschedule or if able, we will work you back into the schedule. There will be a wait time to work you back in the schedule,  depending on availability.  - If you are unable to make it to your appointment as scheduled, please call 24 hours ahead of time to allow [place] to fill the time slot with someone else who needs to be seen. If you do not cancel your appointment ahead of time, you may be charged a no show fee.

## 2016-10-25 NOTE — Progress Notes (Signed)
Patient ID: [name], female   DOB: 1960/06/17, 56 y.o.   MRN: [id number]    [name] , 01-28-1961, 56 y.o., female MRN: [id number]  Chief Complaint  Patient presents with  . Hypertension    Subjective: Hypertension/hyperlipidemia/BMI 30: Pt reports compliance with HCTZ 25 mr QD. Blood pressures ranges at home normal per pt. Patient denies chest pain, shortness of breath or lower extremity edema. Pt does not take a  daily baby ASA. Pt is not prescribed statin. BMP: 03/24/2016, normal GFR (chronic elevated LFT) CBC: 03/24/2016 WNL Lipid: 03/24/2016 Tch 259, HDL 61, LDL169, Tg 143 Diet: Cutting out soda, sugar Exercise: routinely.  RF: HTN, HLD, obesity, FHx HD Lost 4 lbs. Since last visit.   H/O RCC:  Pt ha sa h/o RCC left kidney with partial nephrectomy and RCC right kidney with cryoablation. Scans obtained 09/01/2016 at her f/u urology appt had some potential changes to the lesion in her right kidney. She reports they are discussing her in a tumor board next week to decide if they need to move forward with further evaluation. She is also considering a second opinion just to be certain her kidney health is ok.   Allergies  Allergen Reactions  . Codeine Itching   Social History  Substance Use Topics  . Smoking status: Never Smoker  . Smokeless tobacco: Never Used  . Alcohol use No   Past Medical History:  Diagnosis Date  . Anxiety    psychiatrist: Dr. Robina Ade   . Arthritis   . Colitis   . Colon polyps   . Depression    psychiatrist: Dr. Robina Ade  . Elevated liver enzymes    Dr. Rulon Abide following  . Fatty liver   . GERD (gastroesophageal reflux disease)   . Heart murmur   . History of chicken pox   . Renal cell carcinoma    hx of left and right; Dr. Nena Alexander   Past Surgical History:  Procedure Laterality Date  . CHOLECYSTECTOMY    . FETAL RADIO FREQUENCY ABLATION    . FRACTURE SURGERY Right    ankle  . JOINT REPLACEMENT  2009   knee  . NEPHRECTOMY      partial LT  . TONSILLECTOMY     Family History  Problem Relation Age of Onset  . Hypertension Mother   . Alzheimer's disease Mother   . Arthritis Mother   . Hypertension Father   . Alzheimer's disease Father   . COPD Father   . Heart disease Father   . Arthritis Father   . Hypertension Sister    Allergies as of 10/25/2016      Reactions   Codeine Itching      Medication List       Accurate as of 10/25/16  4:40 PM. Always use your most recent med list.          albuterol 108 (90 Base) MCG/ACT inhaler Commonly known as:  PROVENTIL HFA;VENTOLIN HFA Inhale 2 puffs into the lungs every 6 (six) hours as needed for wheezing or shortness of breath.   ALPRAZolam 0.5 MG 24 hr tablet Commonly known as:  XANAX XR Take 0.5 mg by mouth daily. 2 tabs q AM   amphetamine-dextroamphetamine 20 MG tablet Commonly known as:  ADDERALL Take 20 mg by mouth 3 (three) times daily.   hydrochlorothiazide 25 MG tablet Commonly known as:  HYDRODIURIL Take 1 tablet (25 mg total) by mouth daily.   levalbuterol 0.63 MG/3ML nebulizer solution Commonly known as:  XOPENEX Take 3 mLs (0.63 mg total) by nebulization every 6 (six) hours as needed for wheezing or shortness of breath.   OLANZapine-FLUoxetine 3-25 MG capsule Commonly known as:  SYMBYAX Take 1 capsule by mouth every evening.   potassium chloride SA 20 MEQ tablet Commonly known as:  K-DUR,KLOR-CON Take 1 tablet (20 mEq total) by mouth 2 (two) times daily.   RESTASIS 0.05 % ophthalmic emulsion Generic drug:  cycloSPORINE Place 1 drop into both eyes 2 (two) times daily.   SYMBICORT 160-4.5 MCG/ACT inhaler Generic drug:  budesonide-formoterol USE 2 INHALATIONS TWICE A DAY (RINSE AFTER EACH USE, THIS WILL REPLACE THE DULERA)   Vitamin D-3 5000 units Tabs Take 5,000 Units by mouth daily.        ROS: Negative, with the exception of above mentioned in HPI   Objective:  BP 122/78 (BP Location: Right Arm, Cuff Size: Normal)    Pulse 87   Temp 98.4 F (36.9 C)   Resp 20   Ht 5\' 2"  (1.575 m)   Wt 189 lb 4 oz (85.8 kg)   LMP 04/22/2013   SpO2 97%   BMI 34.61 kg/m  Body mass index is 34.61 kg/m. Gen: Afebrile. No acute distress. Very pleasant caucasian female.  HENT: AT. [place].MMM.  Eyes:Pupils Equal Round Reactive to light, Extraocular movements intact,  Conjunctiva without redness, discharge or icterus. CV: RRR no murmur, no edema, +2/4 P posterior tibialis pulses Chest: CTAB, no wheeze or crackles Abd: Soft. NTND. BS present. no Masses palpated.  Skin: no rashes, purpura or petechiae.  Neuro:  Normal gait. PERLA. EOMi. Alert. Oriented.   Assessment/Plan: [name] is a 56 y.o. female present for acute OV for  Essential hypertension, benign BMI 33.0-33.9,adult Hyperlipidemia - stable, repeat improved.  - Continue hctz 25 mg QD.  - Low sodium diet. - diet and exercise counseling.  - start ASA (81) - start fish oil supplement.  - F/U 6 months.   RCC history:  - discussed findings with her today on her MRI. Advised her to follow the expert recommendations.  Tumor board is meeting next week, and she will find out then their recs.  - if she would like a second opinion after that, would be happy to refer her to the provider she desires.   electronically signed by:  Howard Pouch, DO  [place]

## 2016-11-09 ENCOUNTER — Ambulatory Visit: Admitting: Family Medicine

## 2016-11-25 ENCOUNTER — Encounter: Payer: Self-pay | Admitting: Internal Medicine

## 2016-11-25 ENCOUNTER — Other Ambulatory Visit

## 2016-11-25 ENCOUNTER — Ambulatory Visit (INDEPENDENT_AMBULATORY_CARE_PROVIDER_SITE_OTHER): Admitting: Internal Medicine

## 2016-11-25 VITALS — BP 126/76 | HR 63 | Ht 62.0 in | Wt 188.0 lb

## 2016-11-25 DIAGNOSIS — J3089 Other allergic rhinitis: Secondary | ICD-10-CM | POA: Diagnosis not present

## 2016-11-25 DIAGNOSIS — Z23 Encounter for immunization: Secondary | ICD-10-CM | POA: Diagnosis not present

## 2016-11-25 DIAGNOSIS — J0100 Acute maxillary sinusitis, unspecified: Secondary | ICD-10-CM

## 2016-11-25 DIAGNOSIS — R918 Other nonspecific abnormal finding of lung field: Secondary | ICD-10-CM

## 2016-11-25 MED ORDER — AZITHROMYCIN 250 MG PO TABS: ORAL_TABLET | ORAL | 1 refills | Status: DC

## 2016-11-25 MED ORDER — METHYLPREDNISOLONE ACETATE 80 MG/ML IJ SUSP
80.0000 mg | Freq: Once | INTRAMUSCULAR | Status: AC
  Administered 2016-11-25: 80 mg via INTRAMUSCULAR

## 2016-11-25 MED ORDER — PHENYLEPHRINE HCL 1 % NA SOLN
3.0000 [drp] | Freq: Once | NASAL | Status: AC
  Administered 2016-11-25: 3 [drp] via NASAL

## 2016-11-25 NOTE — Patient Instructions (Addendum)
Script for Zpak sent, with 1 refill you can take with you on your trip  Order- neb neo nasal     Dx acute maxillary sinusitis              Depo 37  Don't run out of your asthma meds- send for refills so you stay stocked up  Order- lab- Allergy profile     Dx seasonal and perennial allergid rhinitis  Flu vax

## 2016-11-25 NOTE — Progress Notes (Signed)
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 MAIC, GERD, complicated by history renal cell CA  CT chest [facility] 12/13/13- Ground glass, tree-in-bud, and micronodular opacities in a bronchovascular pattern involving the right upper lobe are favored to be related to an infectious process. Recommend repeat chest CT after appropriate therapy to document resolution. CT chest 03/15/16 No evidence of interstitial lung disease or mycobacterium aviumcomplex. 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 MAIC, GERD, complicated by history renal cell CA FOLLOW FOR: Yearly follow-up- LOV 11/21/15- NP-  acute bronchitis-doxycycline, prednisone, Hydromet, Depo-Medrol Follow-up at that [facility] with yearly chest CT tracking suspected, unproven MAIC Had flu type A March 2017. Acute bronchitis in August which resolved. Today feels well but admits chronic cough. Very wheezy with exacerbations. Little seasonal rhinitis problem. CXR 06/27/2015 IMPRESSION: No active cardiopulmonary disease.  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 MAIC, GERD, complicated by history renal cell CA ACUTE VISIT: Started last week-used salt water mixture and continues to have tightness in forehead and under the eyes. Pounding pulse in head when lying down at night. Has also tried Mucinex D and no relief. Pt feels like nothing is working to help clear up ? sinus infection. Pt has started to have "itchy" feelings in her chest. Pt due for cruise in 4 weeks.  Tells me about sneezing, frontal pressure, retro-orbital pressure pain. Blames season change. Going on a cruise which I think is the real trigger for this visit. CT  chest 03/15/16 IMPRESSION: 1. No evidence of interstitial lung disease or mycobacterium avium complex. 2. Scattered pulmonary nodules measure 5 mm or less in size. No follow-up needed if patient is low-risk (and has no known or suspected primary neoplasm). Non-contrast chest CT can be considered in 12 months if patient is high-risk. This recommendation follows the consensus statement: Guidelines for Management of Incidental Pulmonary Nodules Detected on CT Images: From the Fleischner Society 2017; Radiology 2017; 284:228-243.  ROS-see HPI   + = positive Constitutional:   No-   weight loss, night sweats, fevers, chills, fatigue, lassitude. HEENT:   +headaches, difficulty swallowing, tooth/dental problems, sore throat,       No-  sneezing, itching, + ear ache, +nasal congestion, +post nasal drip,  CV:  No-  chest pain, orthopnea, PND, swelling in lower extremities, anasarca, dizziness, palpitations Resp: +shortness of breath with exertion or at rest.             No-productive cough,  + non-productive cough,  No- coughing up of blood.              No-change in color of mucus. No- wheezing.   Skin: No-   rash or lesions. GI:  + heartburn, indigestion, No-abdominal pain, nausea, vomiting,  GU: . MS:  No-   joint pain or swelling.   Neuro-     nothing unusual Psych:  No- change in mood or affect. No depression or anxiety.  No memory loss.  OBJ- Physical Exam General- Alert, Oriented, Affect-appropriate, Distress- none acute. + Overweight. Skin- rash-none, lesions- none, excoriation- none Lymphadenopathy- none Head- atraumatic            Eyes- Gross vision intact, PERRLA, conjunctivae and secretions clear            Ears- clear  Nose- no- turbinate edema, no-Septal dev, mucus, polyps, erosion, perforation + sniffing            Throat- Mallampati II , mucosa-red , drainage+, tonsils- atrophic, + raspy voice Neck- flexible , trachea midline, no stridor , thyroid nl, carotid no  bruit Chest - symmetrical excursion , unlabored           Heart/CV- RRR , no murmur , no gallop  , no rub, nl s1 s2                           - JVD- none , edema- none, stasis changes- none, varices- none           Lung- clear unlabored, wheeze- none, cough- none , dullness-none, rub- none           Chest wall-  Abd-  Br/ Gen/ Rectal- Not done, not indicated Extrem- cyanosis- none, clubbing, none, atrophy- none, strength- nl Neuro- grossly intact to observation

## 2016-11-26 LAB — RESPIRATORY ALLERGY PROFILE REGION II ~~LOC~~
Allergen, Cedar tree, t12: <0.1 kU/L
Allergen, Cottonwood, t14: <0.1 kU/L
Allergen, D pternoyssinus,d7: <0.1 kU/L
Allergen, Mouse Urine Protein, e78: <0.1 kU/L
Allergen, Oak,t7: <0.1 kU/L
Bermuda Grass: <0.1 kU/L
Cat Dander: 0.96 kU/L — ABNORMAL HIGH
D. farinae: <0.1 kU/L
Dog Dander: 2.86 kU/L — ABNORMAL HIGH
IGE (IMMUNOGLOBULIN E), SERUM: 52 kU/L (ref <115)
Pecan/Hickory Tree IgE: <0.1 kU/L
Timothy Grass: <0.1 kU/L

## 2016-12-07 ENCOUNTER — Other Ambulatory Visit: Payer: Self-pay | Admitting: *Deleted

## 2016-12-07 MED ORDER — ALBUTEROL SULFATE HFA 108 (90 BASE) MCG/ACT IN AERS: 1.0000 | INHALATION_SPRAY | Freq: Four times a day (QID) | RESPIRATORY_TRACT | 5 refills | Status: DC | PRN

## 2016-12-08 DIAGNOSIS — J0100 Acute maxillary sinusitis, unspecified: Secondary | ICD-10-CM | POA: Insufficient documentation

## 2016-12-08 NOTE — Assessment & Plan Note (Signed)
Small nodules remain stable on latest chest CT. Nonspecific but I favor old inflammatory lesions. Plan-continue to follow

## 2016-12-08 NOTE — Assessment & Plan Note (Signed)
We will manage this is a bacterial infection. Plan-continue saline nasal rinse. Z-Pak, nasal nebulizer decongestant, Depo-Medrol, lab for allergy profile

## 2017-03-11 IMAGING — DX DG CHEST 2V
2 series · 2 of 2 positions shown · non-contrast
Comparison: 03/07/2015

CLINICAL DATA: Congestion, productive cough, and wheezing for 1
week; recent flu diagnosis; h/o bronchitis; non-smoker

EXAM:
CHEST  2 VIEW

[chest pa]
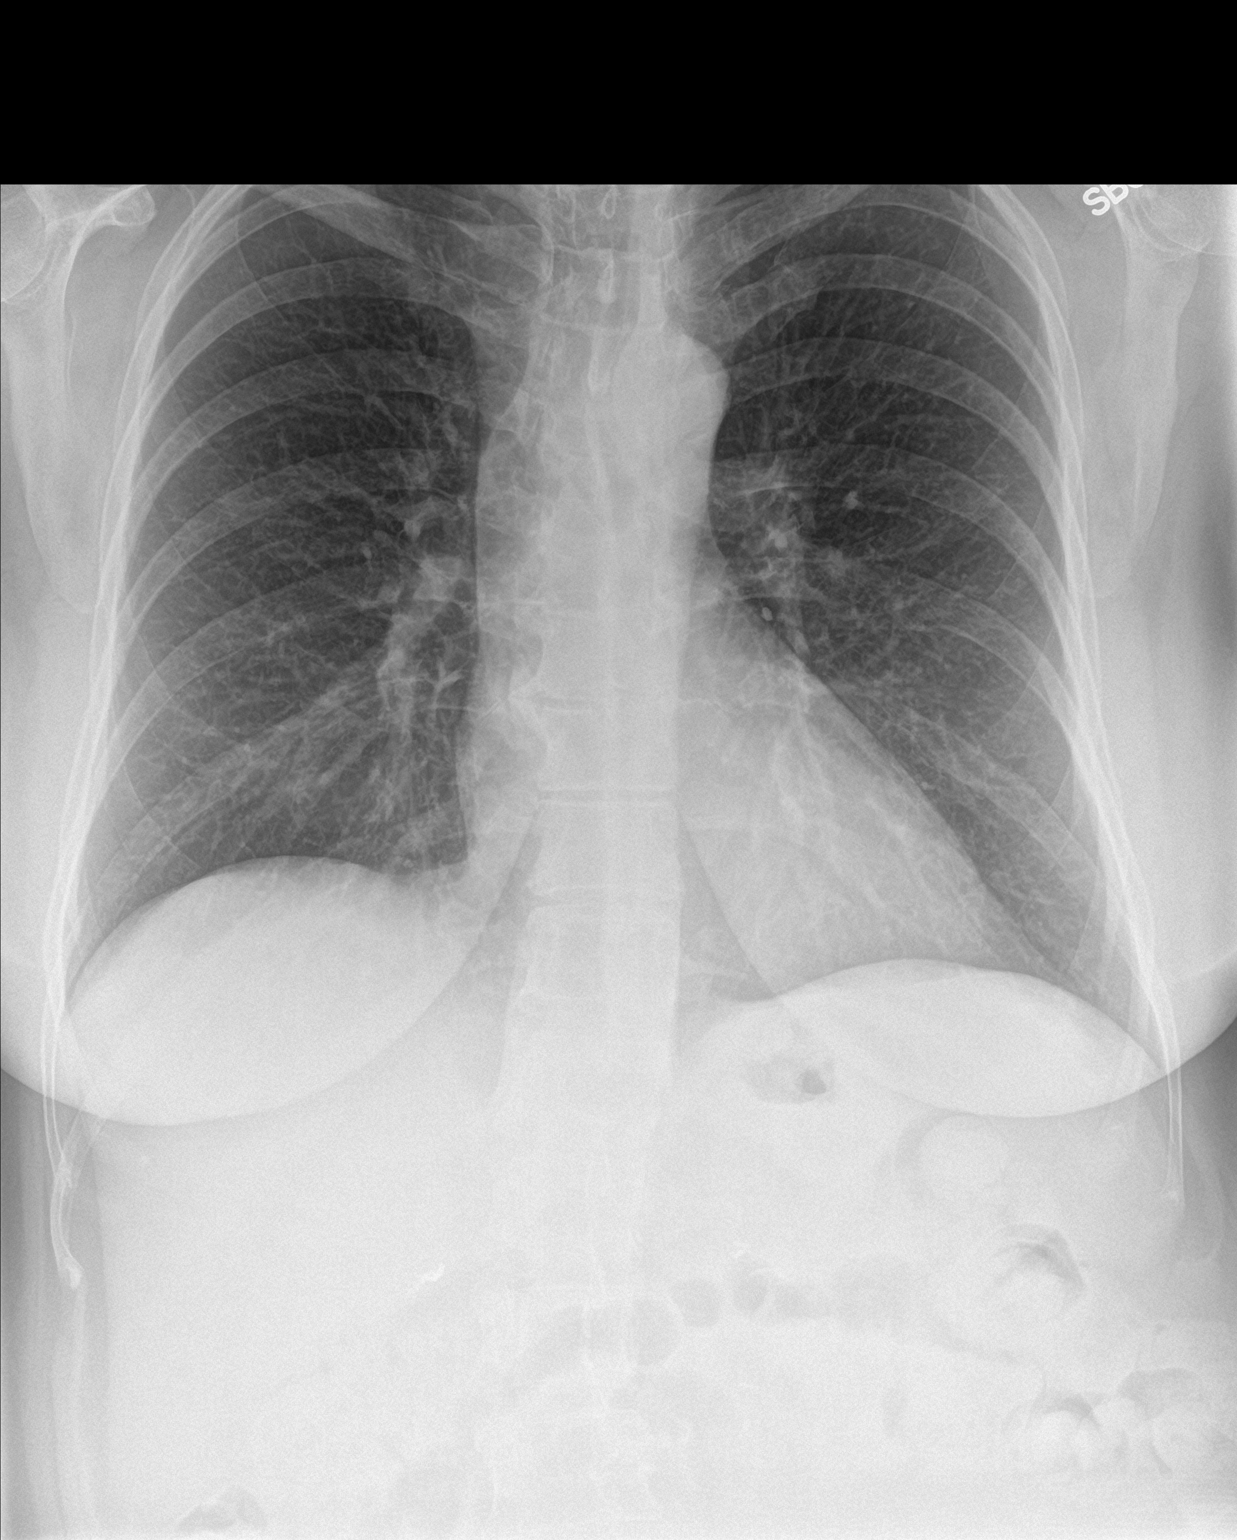

[chest lat]
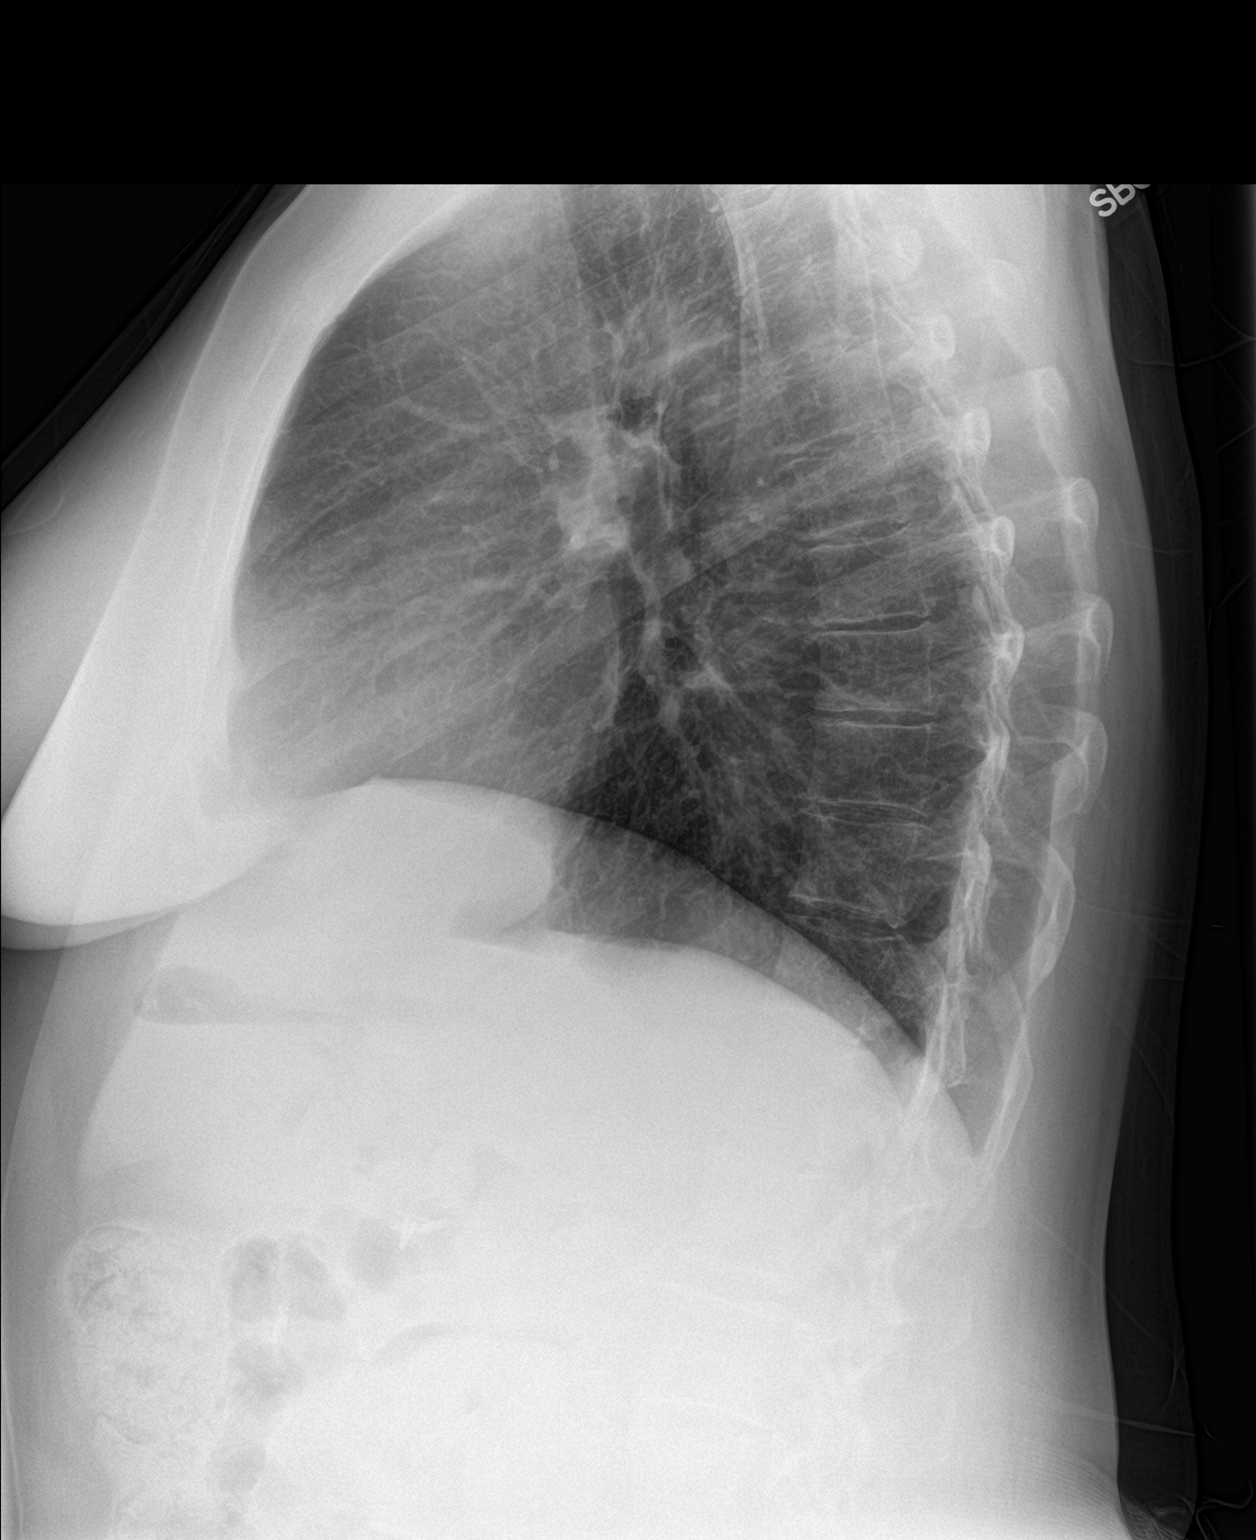

[2 of 2 positions shown; findings below may reference images not displayed]

FINDINGS: Cardiac silhouette is normal in size and configuration. No
mediastinal or hilar masses or evidence of adenopathy.

Lungs are clear.  No pleural effusion or pneumothorax.

Bony thorax is intact.
IMPRESSION: No active cardiopulmonary disease.

## 2017-03-30 ENCOUNTER — Encounter: Admitting: Family Medicine

## 2017-04-05 ENCOUNTER — Other Ambulatory Visit: Payer: Self-pay | Admitting: Family Medicine

## 2017-04-05 DIAGNOSIS — I10 Essential (primary) hypertension: Secondary | ICD-10-CM

## 2017-04-08 ENCOUNTER — Ambulatory Visit (INDEPENDENT_AMBULATORY_CARE_PROVIDER_SITE_OTHER): Admitting: Family Medicine

## 2017-04-08 ENCOUNTER — Encounter: Payer: Self-pay | Admitting: Family Medicine

## 2017-04-08 VITALS — BP 122/53 | HR 76 | Temp 97.9°F | Resp 20 | Ht 62.0 in | Wt 181.0 lb

## 2017-04-08 DIAGNOSIS — Z0000 Encounter for general adult medical examination without abnormal findings: Secondary | ICD-10-CM

## 2017-04-08 DIAGNOSIS — M8580 Other specified disorders of bone density and structure, unspecified site: Secondary | ICD-10-CM | POA: Diagnosis not present

## 2017-04-08 DIAGNOSIS — E876 Hypokalemia: Secondary | ICD-10-CM | POA: Diagnosis not present

## 2017-04-08 DIAGNOSIS — J449 Chronic obstructive pulmonary disease, unspecified: Secondary | ICD-10-CM

## 2017-04-08 DIAGNOSIS — I10 Essential (primary) hypertension: Secondary | ICD-10-CM

## 2017-04-08 DIAGNOSIS — C649 Malignant neoplasm of unspecified kidney, except renal pelvis: Secondary | ICD-10-CM

## 2017-04-08 DIAGNOSIS — R7309 Other abnormal glucose: Secondary | ICD-10-CM

## 2017-04-08 DIAGNOSIS — E559 Vitamin D deficiency, unspecified: Secondary | ICD-10-CM

## 2017-04-08 DIAGNOSIS — Z131 Encounter for screening for diabetes mellitus: Secondary | ICD-10-CM | POA: Diagnosis not present

## 2017-04-08 DIAGNOSIS — E785 Hyperlipidemia, unspecified: Secondary | ICD-10-CM

## 2017-04-08 LAB — LIPID PANEL
CHOLESTEROL: 239 mg/dL — AB (ref 0–200)
HDL: 64.4 mg/dL (ref >39.00)
LDL CALC: 149 mg/dL — AB (ref 0–99)
NonHDL: 174.68
TRIGLYCERIDES: 128 mg/dL (ref 0.0–149.0)
Total CHOL/HDL Ratio: 4
VLDL: 25.6 mg/dL (ref 0.0–40.0)

## 2017-04-08 LAB — CBC WITH DIFFERENTIAL/PLATELET
BASOS PCT: 0.4 % (ref 0.0–3.0)
Basophils Absolute: 0 10*3/uL (ref 0.0–0.1)
EOS ABS: 0.4 10*3/uL (ref 0.0–0.7)
EOS PCT: 6.7 % — AB (ref 0.0–5.0)
HEMATOCRIT: 40.7 % (ref 36.0–46.0)
HEMOGLOBIN: 13.4 g/dL (ref 12.0–15.0)
LYMPHS PCT: 40.7 % (ref 12.0–46.0)
Lymphs Abs: 2.2 10*3/uL (ref 0.7–4.0)
MCHC: 32.8 g/dL (ref 30.0–36.0)
MCV: 89.3 fl (ref 78.0–100.0)
Monocytes Absolute: 0.3 10*3/uL (ref 0.1–1.0)
Monocytes Relative: 5.3 % (ref 3.0–12.0)
Neutro Abs: 2.5 10*3/uL (ref 1.4–7.7)
Neutrophils Relative %: 46.9 % (ref 43.0–77.0)
Platelets: 352 10*3/uL (ref 150.0–400.0)
RBC: 4.55 Mil/uL (ref 3.87–5.11)
RDW: 15.5 % (ref 11.5–15.5)
WBC: 5.3 10*3/uL (ref 4.0–10.5)

## 2017-04-08 LAB — COMPREHENSIVE METABOLIC PANEL
ALBUMIN: 3.9 g/dL (ref 3.5–5.2)
ALK PHOS: 167 U/L — AB (ref 39–117)
ALT: 64 U/L — ABNORMAL HIGH (ref 0–35)
AST: 52 U/L — ABNORMAL HIGH (ref 0–37)
BILIRUBIN TOTAL: 0.4 mg/dL (ref 0.2–1.2)
BUN: 14 mg/dL (ref 6–23)
CALCIUM: 9.7 mg/dL (ref 8.4–10.5)
CHLORIDE: 99 meq/L (ref 96–112)
CO2: 30 mEq/L (ref 19–32)
Creatinine, Ser: 0.69 mg/dL (ref 0.40–1.20)
GFR: 93.38 mL/min (ref >60.00)
Glucose, Bld: 85 mg/dL (ref 70–99)
Potassium: 4.4 mEq/L (ref 3.5–5.1)
Sodium: 137 mEq/L (ref 135–145)
TOTAL PROTEIN: 7.1 g/dL (ref 6.0–8.3)

## 2017-04-08 LAB — HEMOGLOBIN A1C: Hgb A1c MFr Bld: 5.8 % (ref 4.6–6.5)

## 2017-04-08 LAB — TSH: TSH: 2.18 u[IU]/mL (ref 0.35–4.50)

## 2017-04-08 LAB — VITAMIN D 25 HYDROXY (VIT D DEFICIENCY, FRACTURES): VITD: 34.49 ng/mL (ref 30.00–100.00)

## 2017-04-08 MED ORDER — ZOSTER VAC RECOMB ADJUVANTED 50 MCG/0.5ML IM SUSR: 0.5000 mL | Freq: Once | INTRAMUSCULAR | 1 refills | Status: AC

## 2017-04-08 MED ORDER — POTASSIUM CHLORIDE CRYS ER 20 MEQ PO TBCR: 20.0000 meq | EXTENDED_RELEASE_TABLET | Freq: Two times a day (BID) | ORAL | 3 refills | Status: DC

## 2017-04-08 MED ORDER — HYDROCHLOROTHIAZIDE 25 MG PO TABS: 25.0000 mg | ORAL_TABLET | Freq: Every day | ORAL | 1 refills | Status: DC

## 2017-04-08 NOTE — Progress Notes (Signed)
Patient ID: [name], female  DOB: 1961-02-20, 57 y.o.   MRN: [id number] Patient Care Team    Relationship Specialty Notifications Start End  Ma Hillock, DO PCP - General Family Medicine  03/05/15   Gwendel Hanson, MD Referring Physician Urology  04/08/17   Aviva Signs, MD Referring Physician Gastroenterology  04/08/17   Deneise Lever, MD Consulting Physician Pulmonary Disease  04/08/17     Subjective:  [name] is a 57 y.o.  Female  present for CPE. All past medical history, surgical history, allergies, family history, immunizations, medications and social history were updated in the electronic medical record today. All recent labs, ED visits and hospitalizations within the last year were reviewed.  Health maintenance: updated 04/08/2017 Colonoscopy: No family history. Personal history of colitis. Last colonoscopy 01/2016, follow-up every 5 years or colitis. [facility] Mammogram: No family history. Last mammogram 05/2016, [facility]. Patient scheduling appointment for February 9 to have completed. Cervical cancer screening: Last Pap 03/24/2015, normal, follow every 3-5 yr. HPV neg.  Immunizations: Tdap 03/2015 UTD,  flu shot UTD 2018. PNA series completed.  Infectious disease screening: HIV and Hep C completed.  DEXA: DEXA 05/13/2016 osteopenia. Rpt due 2021  Assistive device: None Oxygen use: None Patient has a Dental home. Hospitalizations/ED visits: Reviewed   Depression screen [facility] 2/9 04/08/2017 03/24/2016  Decreased Interest 0 0  Down, Depressed, Hopeless 0 0  PHQ - 2 Score 0 0     Immunization History  Administered Date(s) Administered  . Influenza Split 01/24/2012, 01/06/2013, 01/03/2014, 01/15/2015  . Influenza,inj,Quad PF,6+ Mos 01/06/2016, 11/25/2016  . Pneumococcal Conjugate-13 03/24/2015  . Pneumococcal Polysaccharide-23 12/17/2013  . Tdap 03/24/2015    Past Medical History:    Diagnosis Date  . Anxiety    psychiatrist: Dr. Robina Ade   . Arthritis   . Colitis   . Colon polyps   . Depression    psychiatrist: Dr. Robina Ade  . Elevated liver enzymes    Dr. Rulon Abide following  . Fatty liver   . GERD (gastroesophageal reflux disease)   . Heart murmur   . History of chicken pox   . Renal cell carcinoma    hx of left and right; Dr. Nena Alexander   No Known Allergies Past Surgical History:  Procedure Laterality Date  . CHOLECYSTECTOMY    . FETAL RADIO FREQUENCY ABLATION    . FRACTURE SURGERY Right    ankle  . JOINT REPLACEMENT  2009   knee  . NEPHRECTOMY     partial LT  . TONSILLECTOMY     Family History  Problem Relation Age of Onset  . Hypertension Mother   . Alzheimer's disease Mother   . Arthritis Mother   . Hypertension Father   . Alzheimer's disease Father   . COPD Father   . Heart disease Father   . Arthritis Father   . Hypertension Sister    Social History   Socioeconomic History  . Marital status: Married    Spouse name: Not on file  . Number of children: Not on file  . Years of education: Not on file  . Highest education level: Not on file  Social Needs  . Financial resource strain: Not on file  . Food insecurity - worry: Not on file  . Food insecurity - inability: Not on file  . Transportation needs - medical: Not on file  . Transportation needs - non-medical: Not on file  Occupational History  .  Not on file  Tobacco Use  . Smoking status: Never Smoker  . Smokeless tobacco: Never Used  Substance and Sexual Activity  . Alcohol use: No  . Drug use: No  . Sexual activity: Yes  Other Topics Concern  . Not on file  Social History Narrative   Married. 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   Lives with her husband, son and granddaughter.   Drinks caffeinated beverages.   Wears her seatbelt, wears a bicycle helmet, there is a smoke detector in home. There are no firearms in the home.   Patient feels safe  in her relationships.   Allergies as of 04/08/2017   No Known Allergies     Medication List        Accurate as of 04/08/17 10:31 AM. Always use your most recent med list.          albuterol 108 (90 Base) MCG/ACT inhaler Commonly known as:  PROVENTIL HFA;VENTOLIN HFA Inhale 1-2 puffs into the lungs every 6 (six) hours as needed for wheezing or shortness of breath.   ALPRAZolam 0.5 MG 24 hr tablet Commonly known as:  XANAX XR Take 0.5 mg by mouth daily. 2 tabs q AM   amphetamine-dextroamphetamine 20 MG tablet Commonly known as:  ADDERALL Take 20 mg by mouth 3 (three) times daily.   hydrochlorothiazide 25 MG tablet Commonly known as:  HYDRODIURIL Take 1 tablet (25 mg total) by mouth daily.   OLANZapine-FLUoxetine 3-25 MG capsule Commonly known as:  SYMBYAX Take 1 capsule by mouth every evening.   potassium chloride SA 20 MEQ tablet Commonly known as:  K-DUR,KLOR-CON Take 1 tablet (20 mEq total) by mouth 2 (two) times daily.   RESTASIS 0.05 % ophthalmic emulsion Generic drug:  cycloSPORINE Place 1 drop into both eyes 2 (two) times daily.   SYMBICORT 160-4.5 MCG/ACT inhaler Generic drug:  budesonide-formoterol USE 2 INHALATIONS TWICE A DAY (RINSE AFTER EACH USE, THIS WILL REPLACE THE DULERA)   Vitamin D-3 5000 units Tabs Take 5,000 Units by mouth daily.   Zoster Vaccine Adjuvanted injection Commonly known as:  SHINGRIX Inject 0.5 mLs into the muscle once for 1 dose. Rpt dose once in 2-6 months.        No results found for this or any previous visit (from the past 2160 hour(s)).  Ct Chest High Resolution  Result Date: 03/15/2016 CLINICAL DATA:  Lung nodules, renal cell carcinoma. Mycobacterium avium complex. EXAM: CT CHEST WITHOUT CONTRAST TECHNIQUE: Multidetector CT imaging of the chest was performed following the standard protocol without intravenous contrast. High resolution imaging of the lungs, as well as inspiratory and expiratory imaging, was performed.  COMPARISON:  None. FINDINGS: Cardiovascular: Minimal atherosclerotic calcification of the aorta. Heart size normal. No pericardial effusion. Mediastinum/Nodes: No pathologically enlarged mediastinal or axillary lymph nodes. Hilar regions are difficult to definitively evaluate without IV contrast. Esophagus is grossly unremarkable. Lungs/Pleura: No subpleural reticulation, traction bronchiectasis/ bronchiolectasis, ground-glass, architectural distortion or honeycombing. A few scattered pulmonary nodules measure up to 5 mm. No pleural fluid. Airway is unremarkable. No air trapping. Upper Abdomen: Visualized portions of the liver, adrenal glands, kidneys, spleen, pancreas, stomach and bowel are grossly unremarkable. Cholecystectomy. No upper abdominal adenopathy. Musculoskeletal: No worrisome lytic or sclerotic lesions. Degenerative changes are seen in the spine. IMPRESSION: 1. No evidence of interstitial lung disease or mycobacterium avium complex. 2. Scattered pulmonary nodules measure 5 mm or less in size. No follow-up needed if patient is low-risk (and  has no known or suspected primary neoplasm). Non-contrast chest CT can be considered in 12 months if patient is high-risk. This recommendation follows the consensus statement: Guidelines for Management of Incidental Pulmonary Nodules Detected on CT Images: From the Fleischner Society 2017; Radiology 2017; 284:228-243. Electronically Signed   By: Lorin Picket M.D.   On: 03/15/2016 12:30     ROS: 14 pt review of systems performed and negative (unless mentioned in an HPI)  Objective: BP (!) 122/53 (BP Location: Right Arm, Patient Position: Sitting, Cuff Size: Normal)   Pulse 76   Temp 97.9 F (36.6 C)   Resp 20   Ht _0  (1.575 m)   Wt 181 lb (82.1 kg)   LMP 04/22/2013   SpO2 98%   BMI 33.11 kg/m  Gen: Afebrile. No acute distress. Nontoxic in appearance, well-developed, well-nourished, very pleasant Caucasian female. Obese. HENT: AT. [place]. Bilateral  TM visualized and normal in appearance. MMM. Bilateral nares without erythema or swelling. Throat without erythema or exudates. No cough, no hoarseness. Eyes:Pupils Equal Round Reactive to light, Extraocular movements intact,  Conjunctiva without redness, discharge or icterus. Neck/lymp/endocrine: Supple, no lymphadenopathy, no thyromegaly CV: RRR no murmur, no edema, +2/4 P posterior tibialis pulses Chest: CTAB, no wheeze or crackles Abd: Soft. Obese. NTND. BS present. No Masses palpated.  Skin: No rashes, purpura or petechiae. Warm, well perfused and intact. Neuro:  Normal gait. PERLA. EOMi. Alert. Oriented. Cranial nerves II through XII intact. Muscle strength 5/5 bilateral extremity. DTRs equal bilaterally. Psych: Normal affect, dress and demeanor. Normal speech. Normal thought content and judgment.   Assessment/plan: [name] is a 57 y.o. female present for CPE. Encounter for preventive health examination Patient was encouraged to exercise greater than 150 minutes a week. Patient was encouraged to choose a diet filled with fresh fruits and vegetables, and lean meats. AVS provided to patient today for education/recommendation on gender specific health and safety maintenance. Colonoscopy: Repeat due 2022 Mammogram:  Patient scheduling appointment for February 9 to have completed. Cervical cancer screening: Repeat due in December 2019  Immunizations: Tdap 03/2015 UTD,  flu shot UTD 2018. PNA series completed.  Infectious disease screening: HIV and Hep C completed.  DEXA: DEXA 05/13/2016 osteopenia. Rpt due 2021  Breast cancer screening - She will schedule for 05/14/2017 Elevated hemoglobin A1c - HgB A1c Hyperlipidemia, unspecified hyperlipidemia type - Lipid panel Essential hypertension, benign/morbid obesity - Stable today. Refills provided for 6 months on HCTZ. - Low-sodium and dietary modifications. Routine exercise. - CBC w/Diff - Comp Met (CMET) - hydrochlorothiazide  (HYDRODIURIL) 25 MG tablet; Take 1 tablet (25 mg total) by mouth daily.  Dispense: 90 tablet; Refill: 1 Chronic asthmatic bronchitis (HCC)/pulmonary nodules Continue to follow with pulmonology for routine surveillance and repeat CTs. Vitamin D deficiency - VITAMIN D 25 Hydroxy (Vit-D Deficiency, Fractures) Renal cell carcinoma, unspecified laterality ([place]) Continue follow-up with urologist for routine surveillance Hypokalemia - CMP, refills on potassium  Return in about 1 year (around 04/08/2018) for CPE.  Routine visits for chronic CMC- 6 months  Electronically signed by: Howard Pouch, DO [place]

## 2017-04-08 NOTE — Patient Instructions (Signed)

## 2017-04-11 ENCOUNTER — Telehealth: Payer: Self-pay | Admitting: Family Medicine

## 2017-04-11 ENCOUNTER — Encounter: Payer: Self-pay | Admitting: *Deleted

## 2017-04-11 NOTE — Telephone Encounter (Signed)
Please inform pt her labs are stable in comparison to last year. Her cholesterol is mildly elevated, but improved from last year.  a1c (diabetes screen) is the same at 5.8, which is technically "prediabetes" but again unchanged from last year.  - I am uncertain if she started the fish oil supplement recommended prior (not on her list). If she has not, I recommend she start 2000 mg daily.

## 2017-04-11 NOTE — Telephone Encounter (Signed)
Left message on patient voice mail and sent information in MY Chart.

## 2017-04-12 ENCOUNTER — Other Ambulatory Visit: Payer: Self-pay | Admitting: Family Medicine

## 2017-04-12 DIAGNOSIS — Z1231 Encounter for screening mammogram for malignant neoplasm of breast: Secondary | ICD-10-CM

## 2017-05-06 ENCOUNTER — Telehealth: Payer: Self-pay | Admitting: Internal Medicine

## 2017-05-06 MED ORDER — PREDNISONE 10 MG PO TABS: ORAL_TABLET | ORAL | 0 refills | Status: DC

## 2017-05-06 MED ORDER — DOXYCYCLINE HYCLATE 100 MG PO TABS: ORAL_TABLET | ORAL | 0 refills | Status: DC

## 2017-05-06 NOTE — Telephone Encounter (Signed)
Offer doxycycline 100 mg, # 8, 2 today then one daily           Prednisone 10 mg, # 20, 4 X 2 DAYS, 3 X 2 DAYS, 2 X 2 DAYS, 1 X 2 DAYS

## 2017-05-06 NOTE — Telephone Encounter (Signed)
Patient request to place a message for nurse. Patient is complaining of chest congestion and cough. Patient request prednisone to be called into [facility] in [place].  Called and spoke with patient - head cold began x2 days ago Pt reports chest tightness, non productive cough, wheezing, some SOB with exertion Pt denies fever, chills, or body aches  Pt is using Mucinex 1200mg  daily in last 3 days, Ibuprofen once daily, Symb x2 puff x2 daily, neb treatment once daily last two days, and denies using rescue inhaler any in last few weeks.  Patient is requesting message be sent to [facility] to see if antibiotic and prednisone can be called in for her, or advise if she needs to be seen by TP today for depo shot today  [facility] please advise  Current Outpatient Medications on File Prior to Visit  Medication Sig Dispense Refill  . albuterol (PROVENTIL HFA;VENTOLIN HFA) 108 (90 Base) MCG/ACT inhaler Inhale 1-2 puffs into the lungs every 6 (six) hours as needed for wheezing or shortness of breath. 1 Inhaler 5  . ALPRAZolam (XANAX XR) 0.5 MG 24 hr tablet Take 0.5 mg by mouth daily. 2 tabs q AM    . amphetamine-dextroamphetamine (ADDERALL) 20 MG tablet Take 20 mg by mouth 3 (three) times daily.    . Cholecalciferol (VITAMIN D-3) 5000 units TABS Take 5,000 Units by mouth daily.    . hydrochlorothiazide (HYDRODIURIL) 25 MG tablet Take 1 tablet (25 mg total) by mouth daily. 90 tablet 1  . OLANZapine-FLUoxetine (SYMBYAX) 3-25 MG capsule Take 1 capsule by mouth every evening.     . potassium chloride SA (K-DUR,KLOR-CON) 20 MEQ tablet Take 1 tablet (20 mEq total) by mouth 2 (two) times daily. 180 tablet 3  . RESTASIS 0.05 % ophthalmic emulsion Place 1 drop into both eyes 2 (two) times daily.   3  . SYMBICORT 160-4.5 MCG/ACT inhaler USE 2 INHALATIONS TWICE A DAY (RINSE AFTER EACH USE, THIS WILL REPLACE THE DULERA) 30.6 g 3   No current facility-administered medications on file prior to visit.    No Known Allergies

## 2017-05-06 NOTE — Telephone Encounter (Signed)
Called pt letting her know we were sending 2 scripts to her preferred pharmacy.  Pt expressed understanding. Verified the pharmacy with pt and send Rxs in.  Nothing further needed.

## 2017-05-12 ENCOUNTER — Ambulatory Visit (INDEPENDENT_AMBULATORY_CARE_PROVIDER_SITE_OTHER): Admitting: Internal Medicine

## 2017-05-12 ENCOUNTER — Ambulatory Visit: Admitting: Adult Health

## 2017-05-12 ENCOUNTER — Encounter: Payer: Self-pay | Admitting: Internal Medicine

## 2017-05-12 VITALS — BP 118/82 | HR 97 | Ht 62.0 in | Wt 180.2 lb

## 2017-05-12 DIAGNOSIS — J0100 Acute maxillary sinusitis, unspecified: Secondary | ICD-10-CM

## 2017-05-12 DIAGNOSIS — J0110 Acute frontal sinusitis, unspecified: Secondary | ICD-10-CM | POA: Diagnosis not present

## 2017-05-12 DIAGNOSIS — J4489 Other specified chronic obstructive pulmonary disease: Secondary | ICD-10-CM

## 2017-05-12 DIAGNOSIS — J449 Chronic obstructive pulmonary disease, unspecified: Secondary | ICD-10-CM | POA: Diagnosis not present

## 2017-05-12 MED ORDER — PHENYLEPHRINE HCL 1 % NA SOLN
3.0000 [drp] | Freq: Once | NASAL | Status: AC
  Administered 2017-05-12: 3 [drp] via NASAL

## 2017-05-12 MED ORDER — METHYLPREDNISOLONE ACETATE 80 MG/ML IJ SUSP
80.0000 mg | Freq: Once | INTRAMUSCULAR | Status: AC
  Administered 2017-05-12: 80 mg via INTRAMUSCULAR

## 2017-05-12 MED ORDER — AMOXICILLIN-POT CLAVULANATE 875-125 MG PO TABS: 1.0000 | ORAL_TABLET | Freq: Two times a day (BID) | ORAL | 0 refills | Status: DC

## 2017-05-12 MED ORDER — ALBUTEROL SULFATE (2.5 MG/3ML) 0.083% IN NEBU: 2.5000 mg | INHALATION_SOLUTION | Freq: Four times a day (QID) | RESPIRATORY_TRACT | 12 refills | Status: DC | PRN

## 2017-05-12 NOTE — Progress Notes (Signed)
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 MAIC, GERD, complicated by history renal cell CA  CT chest [facility] 12/13/13- Ground glass, tree-in-bud, and micronodular opacities in a bronchovascular pattern involving the right upper lobe are favored to be related to an infectious process. Recommend repeat chest CT after appropriate therapy to document resolution. CT chest 03/15/16 No evidence of interstitial lung disease or mycobacterium aviumcomplex. 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 MAIC, GERD, complicated by history renal cell CA ACUTE VISIT: Started last week-used salt water mixture and continues to have tightness in forehead and under the eyes. Pounding pulse in head when lying down at night. Has also tried Mucinex D and no relief. Pt feels like nothing is working to help clear up ? sinus infection. Pt has started to have "itchy" feelings in her chest. Pt due for cruise in 4 weeks.  Tells me about sneezing, frontal pressure, retro-orbital pressure pain. Blames season change. Going on a cruise which I think is the real trigger for this visit. CT chest 03/15/16 IMPRESSION: 1. No evidence of interstitial lung disease or mycobacterium avium complex. 2. Scattered pulmonary nodules measure 5 mm or less in size. No follow-up needed if patient is low-risk (and has no known or suspected primary neoplasm). Non-contrast chest CT can be considered in 12 months if patient is high-risk. This recommendation follows the consensus statement: Guidelines for Management of Incidental Pulmonary Nodules Detected on CT Images: From the Fleischner Society 2017; Radiology 2017; 284:228-243.  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 MAIC, GERD, complicated by history renal cell CA ----Chronic asthmatic bronchitis. coughing is worse when lying down. Not feeling any better, still coughing up green phlegm. Finishing pred taper and doxycycline Had minimal small airway reversible obstruction on PFT 2008. Symbicort 160, albuterol HFA,  Acute illness-sore throat, head congestion caught from family, low-grade initial fever.  Some better after prednisone or doxycycline but cough still productive green nasal discharge is green.  Some frontal headache.  ROS-see HPI   + = positive Constitutional:   No-   weight loss, night sweats, fevers, chills, fatigue, lassitude. HEENT:   +headaches, difficulty swallowing, tooth/dental problems, sore throat,       No-  sneezing, itching, + ear ache, +nasal congestion, +post nasal drip,  CV:  No-  chest pain, orthopnea, PND, swelling in lower extremities, anasarca, dizziness, palpitations Resp: +shortness of breath with exertion or at rest.             No-productive cough,  + non-productive cough,  No- coughing up of blood.              +-change in color of mucus. No- wheezing.   Skin: No-   rash or lesions. GI:  + heartburn, indigestion, No-abdominal pain, nausea, vomiting,  GU: . MS:  No-   joint pain or swelling.   Neuro-     nothing unusual Psych:  No- change in mood or affect. No depression or anxiety.  No memory loss.  OBJ- Physical Exam General- Alert, Oriented, Affect-appropriate, Distress- none acute. + Overweight. Skin- rash-none, lesions- none, excoriation- none Lymphadenopathy- none Head- atraumatic            Eyes- Gross vision intact, PERRLA, conjunctivae and secretions clear            Ears- clear  Nose- no- turbinate edema, no-Septal dev, mucus, polyps, erosion, perforation + sniffing            Throat- Mallampati II , mucosa-red , drainage+, tonsils- atrophic, + raspy voice Neck- flexible , trachea midline, no  stridor , thyroid nl, carotid no bruit Chest - symmetrical excursion , unlabored           Heart/CV- RRR , no murmur , no gallop  , no rub, nl s1 s2                           - JVD- none , edema- none, stasis changes- none, varices- none           Lung- clear unlabored, wheeze- none, cough+, dullness-none, rub- none           Chest wall-  Abd-  Br/ Gen/ Rectal- Not done, not indicated Extrem- cyanosis- none, clubbing, none, atrophy- none, strength- nl Neuro- grossly intact to observation

## 2017-05-12 NOTE — Patient Instructions (Addendum)
Order- neb neo nasal    Dx acute maxillary sinusitis              Depo 80  Script sent for augmentin  Script sent for refill albuterol neb solution   Keep August return visit or call sooner if needed

## 2017-05-15 DIAGNOSIS — J0110 Acute frontal sinusitis, unspecified: Secondary | ICD-10-CM | POA: Insufficient documentation

## 2017-05-15 NOTE — Assessment & Plan Note (Signed)
Plan-Augmentin, nasal nebulizer decongestant, Depo-Medrol, saline rinse

## 2017-05-15 NOTE — Assessment & Plan Note (Signed)
Acute exacerbation, infection, Plan depomedrol, Augmentin

## 2017-05-16 ENCOUNTER — Ambulatory Visit
Admission: RE | Admit: 2017-05-16 | Discharge: 2017-05-16 | Disposition: A | Discharge status: Home / Self Care | Source: Ambulatory Visit | Attending: Family Medicine | Admitting: Family Medicine

## 2017-05-16 DIAGNOSIS — Z1231 Encounter for screening mammogram for malignant neoplasm of breast: Secondary | ICD-10-CM

## 2017-10-05 ENCOUNTER — Other Ambulatory Visit: Payer: Self-pay | Admitting: Family Medicine

## 2017-10-05 DIAGNOSIS — I10 Essential (primary) hypertension: Secondary | ICD-10-CM

## 2017-10-18 ENCOUNTER — Ambulatory Visit (INDEPENDENT_AMBULATORY_CARE_PROVIDER_SITE_OTHER): Admitting: Family Medicine

## 2017-10-18 ENCOUNTER — Encounter: Payer: Self-pay | Admitting: Family Medicine

## 2017-10-18 VITALS — BP 108/70 | HR 80 | Temp 99.0°F | Resp 20 | Ht 62.0 in | Wt 181.0 lb

## 2017-10-18 DIAGNOSIS — E785 Hyperlipidemia, unspecified: Secondary | ICD-10-CM | POA: Diagnosis not present

## 2017-10-18 DIAGNOSIS — I10 Essential (primary) hypertension: Secondary | ICD-10-CM

## 2017-10-18 MED ORDER — HYDROCHLOROTHIAZIDE 25 MG PO TABS: 25.0000 mg | ORAL_TABLET | Freq: Every day | ORAL | 1 refills | Status: DC

## 2017-10-18 NOTE — Progress Notes (Signed)
Patient ID: [name], female   DOB: [birth year], 57 y.o.   MRN: [id number]    [name] , Nov 20, 1960, 57 y.o., female MRN: [id number]  Chief Complaint  Patient presents with  . Hypertension  . Hyperlipidemia    Subjective: Hypertension/hyperlipidemia/BMI 30/elevated a1c: Pt reports compliance with HCTZ 25 mg QD. Blood pressures ranges at home normal per pt. Patient denies chest pain, shortness of breath, dizziness or lower extremity edema.  . Pt does not take a  daily baby ASA. Pt is not prescribed statin. BMP: 04/08/2017, chronically elevated liver enzymes CBC: 04/08/2017 within normal limits Lipid: 04/08/2017 total cholesterol 239, HDL 64, LDL 149, triglycerides 128 Diet: Cutting out soda, sugar Exercise: routinely.  RF: HTN, HLD, obesity, FHx HD Lost 4 lbs. Since last visit.   H/O RCC:  Pt ha sa h/o RCC left kidney with partial nephrectomy and RCC right kidney with cryoablation.  Since her last visit she has undergone some more changes with her renal cell carcinoma.  She has a suspicious site they are considering additional treatment.  In she is also going for a second opinion with Dr. Lawanna Kobus, renal oncologist on August 5.  There is an area in her pancreas which they also plan to evaluate endoscopically July 25.     No Known Allergies Social History   Tobacco Use  . Smoking status: Never Smoker  . Smokeless tobacco: Never Used  Substance Use Topics  . Alcohol use: No   Past Medical History:  Diagnosis Date  . Anxiety    psychiatrist: Dr. Robina Ade   . Arthritis   . Colitis   . Colon polyps   . Depression    psychiatrist: Dr. Robina Ade  . Elevated liver enzymes    Dr. Rulon Abide following  . Fatty liver   . GERD (gastroesophageal reflux disease)   . Heart murmur   . History of chicken pox   . Renal cell carcinoma    hx of left and right; Dr. Nena Alexander   Past Surgical History:  Procedure Laterality Date  . CHOLECYSTECTOMY    . FETAL RADIO FREQUENCY  ABLATION    . FRACTURE SURGERY Right    ankle  . JOINT REPLACEMENT  2009   knee  . NEPHRECTOMY     partial LT  . TONSILLECTOMY     Family History  Problem Relation Age of Onset  . Hypertension Mother   . Alzheimer's disease Mother   . Arthritis Mother   . Hypertension Father   . Alzheimer's disease Father   . COPD Father   . Heart disease Father   . Arthritis Father   . Hypertension Sister    Allergies as of 10/18/2017   No Known Allergies     Medication List        Accurate as of 10/18/17 10:13 AM. Always use your most recent med list.          albuterol 108 (90 Base) MCG/ACT inhaler Commonly known as:  PROVENTIL HFA;VENTOLIN HFA Inhale 1-2 puffs into the lungs every 6 (six) hours as needed for wheezing or shortness of breath.   albuterol (2.5 MG/3ML) 0.083% nebulizer solution Commonly known as:  PROVENTIL Take 3 mLs (2.5 mg total) by nebulization every 6 (six) hours as needed for wheezing or shortness of breath.   amphetamine-dextroamphetamine 20 MG tablet Commonly known as:  ADDERALL Take 20 mg by mouth daily.   hydrochlorothiazide 25 MG tablet Commonly known as:  HYDRODIURIL Take 1 tablet (25 mg total)  by mouth daily.   OLANZapine-FLUoxetine 3-25 MG capsule Commonly known as:  SYMBYAX Take 1 capsule by mouth every evening.   potassium chloride SA 20 MEQ tablet Commonly known as:  K-DUR,KLOR-CON Take 1 tablet (20 mEq total) by mouth 2 (two) times daily.   RESTASIS 0.05 % ophthalmic emulsion Generic drug:  cycloSPORINE Place 1 drop into both eyes 2 (two) times daily.   SYMBICORT 160-4.5 MCG/ACT inhaler Generic drug:  budesonide-formoterol USE 2 INHALATIONS TWICE A DAY (RINSE AFTER EACH USE, THIS WILL REPLACE THE DULERA)   Vitamin D-3 5000 units Tabs Take 5,000 Units by mouth daily.        ROS: Negative, with the exception of above mentioned in HPI   Objective:  BP 108/70 (BP Location: Right Arm, Patient Position: Sitting, Cuff Size: Normal)    Pulse 80   Temp 99 F (37.2 C)   Resp 20   Ht 5\' 2"  (1.575 m)   Wt 181 lb (82.1 kg)   LMP 04/22/2013   SpO2 98%   BMI 33.11 kg/m  Body mass index is 33.11 kg/m. Gen: Afebrile. No acute distress.  Nontoxic and presentation, pleasant, obese Caucasian female. HENT: AT. [place].  MMM.  Eyes:Pupils Equal Round Reactive to light, Extraocular movements intact,  Conjunctiva without redness, discharge or icterus. Neck/lymp/endocrine: Supple, no lymphadenopathy, no thyromegaly CV: RRR no murmur, no edema, +2/4 P posterior tibialis pulses Chest: CTAB, no wheeze or crackles Abd: Soft.  Obese. NTND. BS active.  No masses palpated.  Skin: No rashes, purpura or petechiae.  Neuro:  Normal gait. PERLA. EOMi. Alert. Oriented.  Psych: Normal affect, dress and demeanor. Normal speech. Normal thought content and judgment.    Assessment/Plan: [name] is a 57 y.o. female present for acute OV for  Essential hypertension, benign BMI 33.0-33.9,adult Hyperlipidemia -Stable.  Continue HCTZ 25 mg daily. - Low sodium diet. - diet and exercise counseling.  - start ASA (81) - start fish oil supplement.  - F/U 6 months.   RCC history:  - lesion at prior site, has an appt with Dr. Philis Pique [facility]) for cryoablation (Potential).  Pancreatic lesion with endoscopic procedure scheduled for July 25.  Second opinion with Dr. Lawanna Kobus renal oncologist August 5.  electronically signed by:  Howard Pouch, DO  [place]

## 2017-10-18 NOTE — Patient Instructions (Addendum)
You look great.  I have refilled your medication today.  Please keep [place] in the loop about your surgeries .  Followup in 6 months for your CPE   Please help [place] help you:  We are honored you have chosen [facility] for your [facility] home. Below you will find basic instructions that you may need to access in the future. Please help [place] help you by reading the instructions, which cover many of the frequent questions we experience.   Prescription refills and request:  -In order to allow more efficient response time, please call your pharmacy for all refills. They will forward the request electronically to [place]. This allows for the quickest possible response. Request left on a nurse line can take longer to refill, since these are checked as time allows between office patients and other phone calls.  - refill request can take up to 3-5 working days to complete.  - If request is sent electronically and request is appropiate, it is usually completed in 1-2 business days.  - all patients will need to be seen routinely for all chronic medical conditions requiring prescription medications (see follow-up below). If you are overdue for follow up on your condition, you will be asked to make an appointment and we will call in enough medication to cover you until your appointment (up to 30 days).  - all controlled substances will require a face to face visit to request/refill.  - if you desire your prescriptions to go through a new pharmacy, and have an active script at original pharmacy, you will need to call your pharmacy and have scripts transferred to new pharmacy. This is completed between the pharmacy locations and not by your provider.    Results: If any images or labs were ordered, it can take up to 1 week to get results depending on the test ordered and the lab/facility running and resulting the test. - Normal or stable results, which do not need further discussion, may be released to your mychart  immediately with attached note to you. A call may not be generated for normal results. Please make certain to sign up for mychart. If you have questions on how to activate your mychart you can call the front office.  - If your results need further discussion, our office will attempt to contact you via phone, and if unable to reach you after 2 attempts, we will release your abnormal result to your mychart with instructions.  - All results will be automatically released in mychart after 1 week.  - Your provider will provide you with explanation and instruction on all relevant material in your results. Please keep in mind, results and labs may appear confusing or abnormal to the untrained eye, but it does not mean they are actually abnormal for you personally. If you have any questions about your results that are not covered, or you desire more detailed explanation than what was provided, you should make an appointment with your provider to do so.   Our office handles many outgoing and incoming calls daily. If we have not contacted you within 1 week about your results, please check your mychart to see if there is a message first and if not, then contact our office.  In helping with this matter, you help decrease call volume, and therefore allow [place] to be able to respond to patients needs more efficiently.   Acute office visits (sick visit):  An acute visit is intended for a new problem and  are scheduled in shorter time slots to allow schedule openings for patients with new problems. This is the appropriate visit to discuss a new problem. Problems will not be addressed by phone call or Echart message. Appointment is needed if requesting treatment. In order to provide you with excellent quality medical care with proper time for you to explain your problem, have an exam and receive treatment with instructions, these appointments should be limited to one new problem per visit. If you experience a new problem, in  which you desire to be addressed, please make an acute office visit, we save openings on the schedule to accommodate you. Please do not save your new problem for any other type of visit, let us take care of it properly and quickly for you.   Follow up visits:  Depending on your condition(s) your provider will need to see you routinely in order to provide you with quality care and prescribe medication(s). Most chronic conditions (Example: hypertension, Diabetes, depression/anxiety... etc), require visits a couple times a year. Your provider will instruct you on proper follow up for your personal medical conditions and history. Please make certain to make follow up appointments for your condition as instructed. Failing to do so could result in lapse in your medication treatment/refills. If you request a refill, and are overdue to be seen on a condition, we will always provide you with a 30 day script (once) to allow you time to schedule.    Medicare wellness (well visit): - we have a wonderful Nurse Maudie Mercury), that will meet with you and provide you will yearly medicare wellness visits. These visits should occur yearly (can not be scheduled less than 1 calendar year apart) and cover preventive health, immunizations, advance directives and screenings you are entitled to yearly through your medicare benefits. Do not miss out on your entitled benefits, this is when medicare will pay for these benefits to be ordered for you.  These are strongly encouraged by your provider and is the appropriate type of visit to make certain you are up to date with all preventive health benefits. If you have not had your medicare wellness exam in the last 12 months, please make certain to schedule one by calling the office and schedule your medicare wellness with Maudie Mercury as soon as possible.   Yearly physical (well visit):  - Adults are recommended to be seen yearly for physicals. Check with your insurance and date of your last physical,  most insurances require one calendar year between physicals. Physicals include all preventive health topics, screenings, medical exam and labs that are appropriate for gender/age and history. You may have fasting labs needed at this visit. This is a well visit (not a sick visit), new problems should not be covered during this visit (see acute visit).  - Pediatric patients are seen more frequently when they are younger. Your provider will advise you on well child visit timing that is appropriate for your their age. - This is not a medicare wellness visit. Medicare wellness exams do not have an exam portion to the visit. Some medicare companies allow for a physical, some do not allow a yearly physical. If your medicare allows a yearly physical you can schedule the medicare wellness with our nurse Maudie Mercury and have your physical with your provider after, on the same day. Please check with insurance for your full benefits.   Late Policy/No Shows:  - all new patients should arrive 15-30 minutes earlier than appointment to allow [place] time  to  obtain all personal demographics,  insurance information and for you to complete office paperwork. - All established patients should arrive 10-15 minutes earlier than appointment time to update all information and be checked in .  - In our best efforts to run on time, if you are late for your appointment you will be asked to either reschedule or if able, we will work you back into the schedule. There will be a wait time to work you back in the schedule,  depending on availability.  - If you are unable to make it to your appointment as scheduled, please call 24 hours ahead of time to allow [place] to fill the time slot with someone else who needs to be seen. If you do not cancel your appointment ahead of time, you may be charged a no show fee.

## 2017-10-21 ENCOUNTER — Encounter: Payer: Self-pay | Admitting: Family Medicine

## 2017-11-25 ENCOUNTER — Ambulatory Visit: Admitting: Internal Medicine

## 2017-12-15 ENCOUNTER — Ambulatory Visit (INDEPENDENT_AMBULATORY_CARE_PROVIDER_SITE_OTHER)

## 2017-12-15 DIAGNOSIS — Z23 Encounter for immunization: Secondary | ICD-10-CM | POA: Diagnosis not present

## 2018-02-02 ENCOUNTER — Ambulatory Visit (INDEPENDENT_AMBULATORY_CARE_PROVIDER_SITE_OTHER)
Admission: RE | Admit: 2018-02-02 | Discharge: 2018-02-02 | Disposition: A | Discharge status: Home / Self Care | Source: Ambulatory Visit | Attending: Internal Medicine | Admitting: Internal Medicine

## 2018-02-02 ENCOUNTER — Ambulatory Visit (INDEPENDENT_AMBULATORY_CARE_PROVIDER_SITE_OTHER): Admitting: Internal Medicine

## 2018-02-02 ENCOUNTER — Encounter: Payer: Self-pay | Admitting: Internal Medicine

## 2018-02-02 VITALS — BP 124/72 | HR 71 | Ht 62.0 in | Wt 188.0 lb

## 2018-02-02 DIAGNOSIS — C649 Malignant neoplasm of unspecified kidney, except renal pelvis: Secondary | ICD-10-CM | POA: Diagnosis not present

## 2018-02-02 DIAGNOSIS — J449 Chronic obstructive pulmonary disease, unspecified: Secondary | ICD-10-CM

## 2018-02-02 DIAGNOSIS — R918 Other nonspecific abnormal finding of lung field: Secondary | ICD-10-CM

## 2018-02-02 DIAGNOSIS — R911 Solitary pulmonary nodule: Secondary | ICD-10-CM | POA: Diagnosis not present

## 2018-02-02 MED ORDER — FLUTICASONE-SALMETEROL 100-50 MCG/DOSE IN AEPB: INHALATION_SPRAY | RESPIRATORY_TRACT | 3 refills | Status: DC

## 2018-02-02 NOTE — Patient Instructions (Signed)
Script sent changing Symbicort to Advair 100    Inhale 1 puff, then rinse mouth, twice daily  Order- CXR   Dx renal cell cancer, lung nodule  Please call if we can help

## 2018-02-02 NOTE — Progress Notes (Signed)
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 MAIC, GERD, complicated by history renal cell CA PFT- 2008-  CT chest [facility], [place] 12/13/13- Ground glass, tree-in-bud, and micronodular opacities in a bronchovascular pattern involving the right upper lobe are favored to be related to an infectious process. Recommend repeat chest CT after appropriate therapy to document resolution. CT chest 03/15/16 No evidence of interstitial lung disease or mycobacterium aviumcomplex. 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 MAIC, GERD, complicated by history renal cell CA ----Chronic asthmatic bronchitis. coughing is worse when lying down. Not feeling any better, still coughing up green phlegm. Finishing pred taper and doxycycline Had minimal small airway reversible obstruction on PFT 2008. Symbicort 160, albuterol HFA,  Acute illness-sore throat, head congestion caught from family, low-grade initial fever.  Some better after prednisone or doxycycline but cough still productive green nasal discharge is green.  Some frontal headache.  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 MAIC, GERD, complicated by history renal cell CA -----Lung Nodules:  Had right renal cryoablation yesterday at [place] 03/13/2017-[facility]- report only-normal cardiomediastinal silhouette and clear lungs without focal opacity Has had partial left nephrectomy, remote RFA right kidney lesion and now cryoablation. CT of abdomen and pelvis at [facility] had indicated 2 mm left lung base nodule.  She has not had dedicated chest imaging in a year. Breathing very well using Symbicort twice daily.  Her insurance favors Advair now and  she agrees to change.  Little need for rescue inhaler.  ROS-see HPI   + = positive Constitutional:   No-   weight loss, night sweats, fevers, chills, fatigue, lassitude. HEENT:   +headaches, difficulty swallowing, tooth/dental problems, sore throat,       No-  sneezing, itching, + ear ache, +nasal congestion, +post nasal drip,  CV:  No-  chest pain, orthopnea, PND, swelling in lower extremities, anasarca, dizziness, palpitations Resp: +shortness of breath with exertion or at rest.             No-productive cough,   non-productive cough,  No- coughing up of blood.              -change in color of mucus. No- wheezing.   Skin: No-   rash or lesions. GI:  + heartburn, indigestion, No-abdominal pain, nausea, vomiting,  GU: . MS:  No-   joint pain or swelling.   Neuro-     nothing unusual Psych:  No- change in mood or affect. No depression or anxiety.  No memory loss.  OBJ- Physical Exam General- Alert, Oriented, Affect-appropriate, Distress- none acute. + Overweight. Skin- rash-none, lesions- none, excoriation- none Lymphadenopathy- none Head- atraumatic            Eyes- Gross vision intact, PERRLA, conjunctivae and secretions clear            Ears- clear            Nose- no- turbinate edema, no-Septal dev, mucus, polyps, erosion, perforation + sniffing            Throat- Mallampati II , mucosa-red , drainage+, tonsils- atrophic,   Neck- flexible , trachea midline, no stridor , thyroid nl, carotid no bruit Chest - symmetrical excursion , unlabored           Heart/CV- RRR , no murmur , no gallop  , no rub,  nl s1 s2                           - JVD- none , edema- none, stasis changes- none, varices- none           Lung- clear unlabored, wheeze- none, cough- none, dullness-none, rub- none           Chest wall-  Abd-  Br/ Gen/ Rectal- Not done, not indicated Extrem- cyanosis- none, clubbing, none, atrophy- none, strength- nl Neuro- grossly intact to observation

## 2018-02-17 DIAGNOSIS — M25552 Pain in left hip: Secondary | ICD-10-CM | POA: Diagnosis not present

## 2018-02-17 DIAGNOSIS — M25561 Pain in right knee: Secondary | ICD-10-CM | POA: Diagnosis not present

## 2018-03-19 ENCOUNTER — Other Ambulatory Visit: Payer: Self-pay | Admitting: Family Medicine

## 2018-03-19 DIAGNOSIS — I10 Essential (primary) hypertension: Secondary | ICD-10-CM

## 2018-03-27 ENCOUNTER — Ambulatory Visit (INDEPENDENT_AMBULATORY_CARE_PROVIDER_SITE_OTHER): Payer: Medicare Other | Admitting: Pulmonary Disease

## 2018-03-27 ENCOUNTER — Encounter: Payer: Self-pay | Admitting: Pulmonary Disease

## 2018-03-27 VITALS — BP 140/82 | Temp 100.1°F | Ht 62.0 in | Wt 192.8 lb

## 2018-03-27 DIAGNOSIS — J111 Influenza due to unidentified influenza virus with other respiratory manifestations: Secondary | ICD-10-CM | POA: Insufficient documentation

## 2018-03-27 DIAGNOSIS — Z20828 Contact with and (suspected) exposure to other viral communicable diseases: Secondary | ICD-10-CM | POA: Diagnosis not present

## 2018-03-27 LAB — POCT INFLUENZA A/B
Influenza A, POC: NEGATIVE
Influenza B, POC: POSITIVE — AB

## 2018-03-27 MED ORDER — OSELTAMIVIR PHOSPHATE 75 MG PO CAPS: 75.0000 mg | ORAL_CAPSULE | Freq: Two times a day (BID) | ORAL | 0 refills | Status: DC

## 2018-03-27 NOTE — Patient Instructions (Addendum)
Flu swab today  >>> Positive for Flu B  Tamiflu >>> 75 mg twice a day for the next 5 days >>>take with flu   Start Nasal saline rinses   Start flonase   Please start taking a daily antihistamine:  >>>choose one of: zyrtec, claritin, allegra, or xyzal  >>>these are over the counter medications  >>>can choose generic option  >>>take daily  >>>this medication helps with allergies, post nasal drip, and cough    It is flu season:   >>>Remember to be washing your hands regularly, using hand sanitizer, be careful to use around herself with has contact with people who are sick will increase her chances of getting sick yourself. >>> Best ways to protect herself from the flu: Receive the yearly flu vaccine, practice good hand hygiene washing with soap and also using hand sanitizer when available, eat a nutritious meals, get adequate rest, hydrate appropriately   Please contact the office if your symptoms worsen or you have concerns that you are not improving.   Thank you for choosing [facility] for your healthcare, and for allowing [place] to partner with you on your healthcare journey. I am thankful to be able to provide care to you today.   Wyn Quaker FNP-C    Influenza, Adult Influenza, more commonly known as "the flu," is a viral infection that mainly affects the respiratory tract. The respiratory tract includes organs that help you breathe, such as the lungs, nose, and throat. The flu causes many symptoms similar to the common cold along with high fever and body aches. The flu spreads easily from person to person (is contagious). Getting a flu shot (influenza vaccination) every year is the best way to prevent the flu. What are the causes? This condition is caused by the influenza virus. You can get the virus by:  Breathing in droplets that are in the air from an infected person's cough or sneeze.  Touching something that has been exposed to the virus (has been contaminated)  and then touching your mouth, nose, or eyes. What increases the risk? The following factors may make you more likely to get the flu:  Not washing or sanitizing your hands often.  Having close contact with many people during cold and flu season.  Touching your mouth, eyes, or nose without first washing or sanitizing your hands.  Not getting a yearly (annual) flu shot. You may have a higher risk for the flu, including serious problems such as a lung infection (pneumonia), if you:  Are older than 65.  Are pregnant.  Have a weakened disease-fighting system (immune system). You may have a weakened immune system if you: ? Have HIV or AIDS. ? Are undergoing chemotherapy. ? Are taking medicines that reduce (suppress) the activity of your immune system.  Have a long-term (chronic) illness, such as heart disease, kidney disease, diabetes, or lung disease.  Have a liver disorder.  Are severely overweight (morbidly obese).  Have anemia. This is a condition that affects your red blood cells.  Have asthma. What are the signs or symptoms? Symptoms of this condition usually begin suddenly and last 4-14 days. They may include:  Fever and chills.  Headaches, body aches, or muscle aches.  Sore throat.  Cough.  Runny or stuffy (congested) nose.  Chest discomfort.  Poor appetite.  Weakness or fatigue.  Dizziness.  Nausea or vomiting. How is this diagnosed? This condition may be diagnosed based on:  Your symptoms and medical history.  A physical exam.  Swabbing your nose or throat and testing the fluid for the influenza virus. How is this treated? If the flu is diagnosed early, you can be treated with medicine that can help reduce how severe the illness is and how long it lasts (antiviral medicine). This may be given by mouth (orally) or through an IV. Taking care of yourself at home can help relieve symptoms. Your health care provider may recommend:  Taking  over-the-counter medicines.  Drinking plenty of fluids. In many cases, the flu goes away on its own. If you have severe symptoms or complications, you may be treated in a hospital. Follow these instructions at home: Activity  Rest as needed and get plenty of sleep.  Stay home from work or school as told by your health care provider. Unless you are visiting your health care provider, avoid leaving home until your fever has been gone for 24 hours without taking medicine. Eating and drinking  Take an oral rehydration solution (ORS). This is a drink that is sold at pharmacies and retail stores.  Drink enough fluid to keep your urine pale yellow.  Drink clear fluids in small amounts as you are able. Clear fluids include water, ice chips, diluted fruit juice, and low-calorie sports drinks.  Eat bland, easy-to-digest foods in small amounts as you are able. These foods include bananas, applesauce, rice, lean meats, toast, and crackers.  Avoid drinking fluids that contain a lot of sugar or caffeine, such as energy drinks, regular sports drinks, and soda.  Avoid alcohol.  Avoid spicy or fatty foods. General instructions      Take over-the-counter and prescription medicines only as told by your health care provider.  Use a cool mist humidifier to add humidity to the air in your home. This can make it easier to breathe.  Cover your mouth and nose when you cough or sneeze.  Wash your hands with soap and water often, especially after you cough or sneeze. If soap and water are not available, use alcohol-based hand sanitizer.  Keep all follow-up visits as told by your health care provider. This is important. How is this prevented?   Get an annual flu shot. You may get the flu shot in late summer, fall, or winter. Ask your health care provider when you should get your flu shot.  Avoid contact with people who are sick during cold and flu season. This is generally fall and winter. Contact a  health care provider if:  You develop new symptoms.  You have: ? Chest pain. ? Diarrhea. ? A fever.  Your cough gets worse.  You produce more mucus.  You feel nauseous or you vomit. Get help right away if:  You develop shortness of breath or difficulty breathing.  Your skin or nails turn a bluish color.  You have severe pain or stiffness in your neck.  You develop a sudden headache or sudden pain in your face or ear.  You cannot eat or drink without vomiting. Summary  Influenza, more commonly known as "the flu," is a viral infection that primarily affects your respiratory tract.  Symptoms of the flu usually begin suddenly and last 4-14 days.  Getting an annual flu shot is the best way to prevent getting the flu.  Stay home from work or school as told by your health care provider. Unless you are visiting your health care provider, avoid leaving home until your fever has been gone for 24 hours without taking medicine.  Keep all follow-up visits as told  by your health care provider. This is important. This information is not intended to replace advice given to you by your health care provider. Make sure you discuss any questions you have with your health care provider. Document Released: 03/19/2000 Document Revised: 09/07/2017 Document Reviewed: 09/07/2017 Elsevier Interactive Patient Education  2019 [employer].

## 2018-03-27 NOTE — Assessment & Plan Note (Signed)
Flu swab today  >>> Positive for Flu B  Tamiflu >>> 75 mg twice a day for the next 5 days >>>take with flu   Start Nasal saline rinses   Start flonase   Please start taking a daily antihistamine:  >>>choose one of: zyrtec, claritin, allegra, or xyzal  >>>these are over the counter medications  >>>can choose generic option  >>>take daily  >>>this medication helps with allergies, post nasal drip, and cough

## 2018-03-27 NOTE — Progress Notes (Signed)
Discussed results with patient in office. Started on tamiflu.  Nothing further is needed at this time.  Wyn Quaker FNP

## 2018-03-27 NOTE — Progress Notes (Signed)
@Patient  ID: [name], female    DOB: Aug 09, 1960, 57 y.o.   MRN: [id number]  Chief Complaint  Patient presents with  . Acute Visit    cough, flu exposure    Referring provider: Ma Hillock, DO  HPI:  57 year old female never smoker followed in our office for recurrent acute bronchitis with asthma, lung nodules, MAI  PMH: GERD, history of renal cell cancer Smoker/ Smoking History: Never smoker Maintenance: Advair 100 Pt of: Dr. Annamaria Boots  03/27/2018  - Visit   57 year old female patient presenting today for acute visit.  Patient reports that her granddaughter has been confirmed flu be positive with known exposure to granddaughter and granddaughter slept with her a few days ago.  Son is also been exposed with probable flu diagnosis although flu swab is been negative.  Patient reports that symptoms started on 03/25/2018.  Patient reports she has been trying to do over-the-counter measures but just feels increased fatigue, backaches, increased sinus pain and drainage, patient believes she may be having bronchitis or a sinus infection.  Patient does not believe that she is having the flu.  Patient did get the flu vaccine this year.  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    Tests:   CT chest [facility] 12/13/13- Ground glass, tree-in-bud, and micronodular opacities in a bronchovascular pattern involving the right upper lobe are favored to be related to an infectious process. Recommend repeat chest CT after appropriate therapy to document resolution.  CT chest 03/15/16 No evidence of interstitial lung disease or mycobacterium aviumcomplex. Scattered pulmonary nodules  02/02/18-chest x-ray-no active cardiopulmonary disease  05/20/2017-pulmonary function test- FVC 3.85 (120% predicted), postbronchodilator ratio 78, FEV1 122, DLCO 86   FENO:  No results found for: NITRICOXIDE  PFT: No flowsheet data found.  Imaging: No results  found.    Specialty Problems      Pulmonary Problems   Multiple pulmonary nodules determined by computed tomography of lung    Chest CT [facility] 2015-micronodules, groundglass, tree in bud right upper lobe      Chronic asthmatic bronchitis (HCC)     CT chest [facility] 12/13/13- Ground glass, tree-in-bud, and micronodular opacities in a bronchovascular pattern involving the right upper lobe are favored to be related to an infectious process. Recommend repeat chest CT after appropriate therapy to document resolutio      Acute frontal sinusitis   Influenza with sinusitis      No Known Allergies  Immunization History  Administered Date(s) Administered  . Influenza Split 01/24/2012, 01/06/2013, 01/03/2014, 01/15/2015  . Influenza,inj,Quad PF,6+ Mos 12/16/2014, 01/06/2016, 11/25/2016, 12/15/2017  . Pneumococcal Conjugate-13 03/24/2015  . Pneumococcal Polysaccharide-23 12/17/2013  . Tdap 03/24/2015    Past Medical History:  Diagnosis Date  . Anxiety    psychiatrist: Dr. Robina Ade   . Arthritis   . Colitis   . Colon polyps   . Depression    psychiatrist: Dr. Robina Ade  . Elevated liver enzymes    Dr. Rulon Abide following  . Fatty liver   . GERD (gastroesophageal reflux disease)   . Heart murmur   . History of chicken pox   . Renal cell carcinoma    hx of left and right; Dr. Nena Alexander    Tobacco History: Social History   Tobacco Use  Smoking Status Never Smoker  Smokeless Tobacco Never Used   Counseling given: Yes  Continue to not smoke  Outpatient Encounter Medications as of 03/27/2018  Medication Sig  . albuterol (PROVENTIL HFA;VENTOLIN  HFA) 108 (90 Base) MCG/ACT inhaler Inhale 1-2 puffs into the lungs every 6 (six) hours as needed for wheezing or shortness of breath.  Marland Kitchen albuterol (PROVENTIL) (2.5 MG/3ML) 0.083% nebulizer solution Take 3 mLs (2.5 mg total) by nebulization every 6 (six) hours as needed for wheezing or shortness of breath.  . Cholecalciferol (VITAMIN D-3) 5000  units TABS Take 5,000 Units by mouth daily.  . Fluticasone-Salmeterol (ADVAIR DISKUS) 100-50 MCG/DOSE AEPB Inhale 1 puff then rinse mouth, twice daily  . hydrochlorothiazide (HYDRODIURIL) 25 MG tablet Take 1 tablet (25 mg total) by mouth daily.  Marland Kitchen OLANZapine-FLUoxetine (SYMBYAX) 3-25 MG capsule Take 1 capsule by mouth every evening.   . potassium chloride SA (K-DUR,KLOR-CON) 20 MEQ tablet TAKE 1 TABLET TWICE A DAY  . amphetamine-dextroamphetamine (ADDERALL) 20 MG tablet Take 20 mg by mouth daily.   Marland Kitchen oseltamivir (TAMIFLU) 75 MG capsule Take 1 capsule (75 mg total) by mouth 2 (two) times daily.  . RESTASIS 0.05 % ophthalmic emulsion Place 1 drop into both eyes 2 (two) times daily.    No facility-administered encounter medications on file as of 03/27/2018.      Review of Systems  Review of Systems  Constitutional: Positive for fatigue and fever (Temp: 100.5). Negative for chills and unexpected weight change.  HENT: Positive for congestion, postnasal drip and sinus pressure. Negative for ear pain.   Respiratory: Positive for cough (dry cough, occasionally productive - yellow / green ). Negative for chest tightness, shortness of breath and wheezing.   Cardiovascular: Negative for chest pain, palpitations and leg swelling.  Gastrointestinal: Negative for diarrhea, nausea and vomiting.  Musculoskeletal: Positive for back pain (back aches ). Negative for arthralgias.  Skin: Negative for color change.  Allergic/Immunologic: Positive for environmental allergies (fall / winter ). Negative for food allergies.  Neurological: Negative for light-headedness and headaches.  Psychiatric/Behavioral: Negative for dysphoric mood. The patient is not nervous/anxious.   All other systems reviewed and are negative.    Physical Exam  BP 140/82 (BP Location: Left Arm, Cuff Size: Normal)   Temp 100.1 F (37.8 C) (Oral)   Ht 5' 2"  (1.575 m)   Wt 192 lb 12.8 oz (87.5 kg)   LMP 04/22/2013   SpO2 96%   BMI  35.26 kg/m   Wt Readings from Last 5 Encounters:  03/27/18 192 lb 12.8 oz (87.5 kg)  02/02/18 188 lb (85.3 kg)  10/18/17 181 lb (82.1 kg)  05/12/17 180 lb 3.2 oz (81.7 kg)  04/08/17 181 lb (82.1 kg)     Physical Exam  Constitutional: She is oriented to person, place, and time and well-developed, well-nourished, and in no distress. No distress.  HENT:  Head: Normocephalic and atraumatic.  Right Ear: Hearing, tympanic membrane, external ear and ear canal normal.  Left Ear: Hearing, tympanic membrane, external ear and ear canal normal.  Nose: Mucosal edema and rhinorrhea present. Right sinus exhibits maxillary sinus tenderness. Right sinus exhibits no frontal sinus tenderness. Left sinus exhibits maxillary sinus tenderness. Left sinus exhibits no frontal sinus tenderness.  Mouth/Throat: Uvula is midline and oropharynx is clear and moist. No oropharyngeal exudate.  Eyes: Pupils are equal, round, and reactive to light.  Neck: Normal range of motion. Neck supple. No JVD present.  Cardiovascular: Normal rate, regular rhythm and normal heart sounds.  Pulmonary/Chest: Effort normal and breath sounds normal. No accessory muscle usage. No respiratory distress. She has no decreased breath sounds. She has no wheezes. She has no rhonchi. She has no rales.  Abdominal:  Soft. Bowel sounds are normal. There is no abdominal tenderness.  Musculoskeletal: Normal range of motion.        General: No edema.  Lymphadenopathy:    She has no cervical adenopathy.  Neurological: She is alert and oriented to person, place, and time. Gait normal.  Skin: Skin is warm and dry. She is not diaphoretic. No erythema.  Psychiatric: Mood, memory, affect and judgment normal.  Nursing note and vitals reviewed.   03/27/18 -flu swab- positive for flu B  Lab Results:  CBC    Component Value Date/Time   WBC 5.3 04/08/2017 1040   RBC 4.55 04/08/2017 1040   HGB 13.4 04/08/2017 1040   HCT 40.7 04/08/2017 1040   PLT  352.0 04/08/2017 1040   MCV 89.3 04/08/2017 1040   MCHC 32.8 04/08/2017 1040   RDW 15.5 04/08/2017 1040   LYMPHSABS 2.2 04/08/2017 1040   MONOABS 0.3 04/08/2017 1040   EOSABS 0.4 04/08/2017 1040   BASOSABS 0.0 04/08/2017 1040    BMET    Component Value Date/Time   NA 137 04/08/2017 1040   K 4.4 04/08/2017 1040   CL 99 04/08/2017 1040   CO2 30 04/08/2017 1040   GLUCOSE 85 04/08/2017 1040   BUN 14 04/08/2017 1040   CREATININE 0.69 04/08/2017 1040   CALCIUM 9.7 04/08/2017 1040   GFRNONAA >60 10/24/2008 0410   GFRAA  10/24/2008 0410    >60        The eGFR has been calculated using the MDRD equation. This calculation has not been validated in all clinical situations. eGFR's persistently <60 mL/min signify possible Chronic Kidney Disease.    BNP No results found for: BNP  ProBNP No results found for: PROBNP    Assessment & Plan:   Pleasant 57 year old female patient complaining follow-up with our office today.  With patient's known exposure to flu B will swab today.  Patient's flu swab is positive for flu B will start patient on Tamiflu as it is within the 48-hour window since symptoms started.  We will have patient start Flonase, nasal saline rinses, and a daily antihistamine to help with symptom relief.  Patient can continue to take ibuprofen and or Tylenol to help with fever  Influenza with sinusitis Flu swab today  >>> Positive for Flu B  Tamiflu >>> 75 mg twice a day for the next 5 days >>>take with flu   Start Nasal saline rinses   Start flonase   Please start taking a daily antihistamine:  >>>choose one of: zyrtec, claritin, allegra, or xyzal  >>>these are over the counter medications  >>>can choose generic option  >>>take daily  >>>this medication helps with allergies, post nasal drip, and cough       Lauraine Rinne, NP 03/27/2018   This appointment was 36 min long with over 50% of the time in direct face-to-face patient care, assessment,  plan of care, and follow-up.

## 2018-03-31 ENCOUNTER — Encounter: Payer: Self-pay | Admitting: Pulmonary Disease

## 2018-03-31 ENCOUNTER — Ambulatory Visit (INDEPENDENT_AMBULATORY_CARE_PROVIDER_SITE_OTHER): Payer: Medicare Other | Admitting: Pulmonary Disease

## 2018-03-31 VITALS — BP 132/80 | HR 72 | Temp 98.4°F | Ht 62.0 in | Wt 192.0 lb

## 2018-03-31 DIAGNOSIS — J111 Influenza due to unidentified influenza virus with other respiratory manifestations: Secondary | ICD-10-CM

## 2018-03-31 MED ORDER — METHYLPREDNISOLONE ACETATE 80 MG/ML IJ SUSP
80.0000 mg | Freq: Once | INTRAMUSCULAR | Status: AC
  Administered 2018-03-31: 80 mg via INTRAMUSCULAR

## 2018-03-31 MED ORDER — DOXYCYCLINE HYCLATE 100 MG PO TABS: 100.0000 mg | ORAL_TABLET | Freq: Two times a day (BID) | ORAL | 0 refills | Status: DC

## 2018-03-31 MED ORDER — PREDNISONE 10 MG PO TABS: ORAL_TABLET | ORAL | 0 refills | Status: DC

## 2018-03-31 NOTE — Progress Notes (Signed)
@Patient  ID: [name], female    DOB: [birth year], 57 y.o.   MRN: [id number]  Chief Complaint  Patient presents with  . Acute Visit    4 day f/u. Increased wheezing. Productive cough with yellowish, greenish phlegm. Denies any fever.     Referring provider: Ma Hillock, DO  HPI:  57 year old female never smoker followed in our office for recurrent acute bronchitis with asthma, lung nodules, MAI  PMH: GERD, history of renal cell cancer Smoker/ Smoking History: Never smoker Maintenance: Advair 100 Pt of: Dr. Annamaria Boots  03/31/2018  - Visit   57 year old female patient initially seen on 03/27/2018 and was diagnosed with influenza infection started on Tamiflu.  Patient reports that her husband was also started on Tamiflu prophylactically due to exposure.  Patient's granddaughter positive for influenza B.  Patient son was suspected influenza but negative POCT test.  Patient reports that symptoms worsened on 03/28/2018 with body aches, chills, fever.  On 03/30/2018 body aches and flu symptoms have improved significantly.  Patient did receive the flu vaccine this year.  Patient reports that her wheezing, productive cough with green mucus, nasal drainage with green mucus, and sinus pressure persist.      Tests:   03/27/2017-flu swab-positive for flu B  CT chest [facility] 12/13/13- Ground glass, tree-in-bud, and micronodular opacities in a bronchovascular pattern involving the right upper lobe are favored to be related to an infectious process. Recommend repeat chest CT after appropriate therapy to document resolution.  CT chest 03/15/16 No evidence of interstitial lung disease or mycobacterium aviumcomplex. Scattered pulmonary nodules  02/02/18-chest x-ray-no active cardiopulmonary disease  05/20/2017-pulmonary function test- FVC 3.85 (120% predicted), postbronchodilator ratio 78, FEV1 122, DLCO 86  FENO:  No results found for: NITRICOXIDE  PFT: No flowsheet data  found.  Imaging: No results found.    Specialty Problems      Pulmonary Problems   Multiple pulmonary nodules determined by computed tomography of lung    Chest CT [facility] 2015-micronodules, groundglass, tree in bud right upper lobe      Chronic asthmatic bronchitis (HCC)     CT chest [facility] 12/13/13- Ground glass, tree-in-bud, and micronodular opacities in a bronchovascular pattern involving the right upper lobe are favored to be related to an infectious process. Recommend repeat chest CT after appropriate therapy to document resolutio      Acute frontal sinusitis   Influenza with sinusitis      No Known Allergies  Immunization History  Administered Date(s) Administered  . Influenza Split 01/24/2012, 01/06/2013, 01/03/2014, 01/15/2015  . Influenza,inj,Quad PF,6+ Mos 12/16/2014, 01/06/2016, 11/25/2016, 12/15/2017  . Pneumococcal Conjugate-13 03/24/2015  . Pneumococcal Polysaccharide-23 12/17/2013  . Tdap 03/24/2015    Past Medical History:  Diagnosis Date  . Anxiety    psychiatrist: Dr. Robina Ade   . Arthritis   . Colitis   . Colon polyps   . Depression    psychiatrist: Dr. Robina Ade  . Elevated liver enzymes    Dr. Rulon Abide following  . Fatty liver   . GERD (gastroesophageal reflux disease)   . Heart murmur   . History of chicken pox   . Renal cell carcinoma    hx of left and right; Dr. Nena Alexander    Tobacco History: Social History   Tobacco Use  Smoking Status Never Smoker  Smokeless Tobacco Never Used   Counseling given: Yes   Outpatient Encounter Medications as of 03/31/2018  Medication Sig  . albuterol (PROVENTIL HFA;VENTOLIN HFA) 108 (90 Base) MCG/ACT inhaler Inhale  1-2 puffs into the lungs every 6 (six) hours as needed for wheezing or shortness of breath.  Marland Kitchen albuterol (PROVENTIL) (2.5 MG/3ML) 0.083% nebulizer solution Take 3 mLs (2.5 mg total) by nebulization every 6 (six) hours as needed for wheezing or shortness of breath.  . amphetamine-dextroamphetamine  (ADDERALL) 20 MG tablet Take 20 mg by mouth daily.   . Cholecalciferol (VITAMIN D-3) 5000 units TABS Take 5,000 Units by mouth daily.  . Fluticasone-Salmeterol (ADVAIR DISKUS) 100-50 MCG/DOSE AEPB Inhale 1 puff then rinse mouth, twice daily  . hydrochlorothiazide (HYDRODIURIL) 25 MG tablet Take 1 tablet (25 mg total) by mouth daily.  Marland Kitchen OLANZapine-FLUoxetine (SYMBYAX) 3-25 MG capsule Take 1 capsule by mouth every evening.   Marland Kitchen oseltamivir (TAMIFLU) 75 MG capsule Take 1 capsule (75 mg total) by mouth 2 (two) times daily.  . potassium chloride SA (K-DUR,KLOR-CON) 20 MEQ tablet TAKE 1 TABLET TWICE A DAY  . RESTASIS 0.05 % ophthalmic emulsion Place 1 drop into both eyes 2 (two) times daily.   Marland Kitchen doxycycline (VIBRA-TABS) 100 MG tablet Take 1 tablet (100 mg total) by mouth 2 (two) times daily.  . predniSONE (DELTASONE) 10 MG tablet 4 tabs for 2 days, then 3 tabs for 2 days, 2 tabs for 2 days, then 1 tab for 2 days, then stop   No facility-administered encounter medications on file as of 03/31/2018.      Review of Systems  Review of Systems  Constitutional: Positive for activity change, chills, fatigue and fever.  HENT: Positive for congestion, postnasal drip, sinus pressure and sinus pain. Negative for ear pain and trouble swallowing.   Respiratory: Positive for cough and wheezing. Negative for chest tightness and shortness of breath.   Cardiovascular: Negative for chest pain and palpitations.  Gastrointestinal: Negative for diarrhea, nausea and vomiting.  Musculoskeletal: Positive for arthralgias and myalgias.  Neurological: Negative for dizziness and headaches.  Psychiatric/Behavioral: Negative for decreased concentration. The patient is not nervous/anxious.      Physical Exam  BP 132/80 (BP Location: Left Arm, Patient Position: Sitting, Cuff Size: Normal)   Pulse 72   Temp 98.4 F (36.9 C) (Oral)   Ht 5' 2"  (1.575 m)   Wt 192 lb (87.1 kg)   LMP 04/22/2013   SpO2 98%   BMI 35.12 kg/m    Wt Readings from Last 5 Encounters:  03/31/18 192 lb (87.1 kg)  03/27/18 192 lb 12.8 oz (87.5 kg)  02/02/18 188 lb (85.3 kg)  10/18/17 181 lb (82.1 kg)  05/12/17 180 lb 3.2 oz (81.7 kg)    Physical Exam  Constitutional: She is oriented to person, place, and time and well-developed, well-nourished, and in no distress. No distress.  HENT:  Head: Normocephalic and atraumatic.  Right Ear: Hearing, external ear and ear canal normal.  Left Ear: Hearing, external ear and ear canal normal.  Nose: Mucosal edema present. Right sinus exhibits maxillary sinus tenderness. Right sinus exhibits no frontal sinus tenderness. Left sinus exhibits maxillary sinus tenderness. Left sinus exhibits no frontal sinus tenderness.  Mouth/Throat: Uvula is midline and oropharynx is clear and moist. No oropharyngeal exudate.  + TMs with effusion without infection bilaterally + Postnasal drip  Eyes: Pupils are equal, round, and reactive to light.  Neck: Normal range of motion. Neck supple.  Cardiovascular: Normal rate, regular rhythm and normal heart sounds.  Pulmonary/Chest: Effort normal. No accessory muscle usage. No respiratory distress. She has no decreased breath sounds. She has wheezes.  Musculoskeletal: Normal range of motion.  Lymphadenopathy:  Head (right side): Tonsillar adenopathy present.       Head (left side): No tonsillar adenopathy present.    She has no cervical adenopathy.  Neurological: She is alert and oriented to person, place, and time. Gait normal.  Skin: Skin is warm and dry. She is not diaphoretic. No erythema.  Psychiatric: Mood, memory, affect and judgment normal.  Nursing note and vitals reviewed.     Lab Results:  CBC    Component Value Date/Time   WBC 5.3 04/08/2017 1040   RBC 4.55 04/08/2017 1040   HGB 13.4 04/08/2017 1040   HCT 40.7 04/08/2017 1040   PLT 352.0 04/08/2017 1040   MCV 89.3 04/08/2017 1040   MCHC 32.8 04/08/2017 1040   RDW 15.5 04/08/2017 1040    LYMPHSABS 2.2 04/08/2017 1040   MONOABS 0.3 04/08/2017 1040   EOSABS 0.4 04/08/2017 1040   BASOSABS 0.0 04/08/2017 1040    BMET    Component Value Date/Time   NA 137 04/08/2017 1040   K 4.4 04/08/2017 1040   CL 99 04/08/2017 1040   CO2 30 04/08/2017 1040   GLUCOSE 85 04/08/2017 1040   BUN 14 04/08/2017 1040   CREATININE 0.69 04/08/2017 1040   CALCIUM 9.7 04/08/2017 1040   GFRNONAA >60 10/24/2008 0410   GFRAA  10/24/2008 0410    >60        The eGFR has been calculated using the MDRD equation. This calculation has not been validated in all clinical situations. eGFR's persistently <60 mL/min signify possible Chronic Kidney Disease.    BNP No results found for: BNP  ProBNP No results found for: PROBNP    Assessment & Plan:   57 year old female presenting today for acute visit.  Patient with influenza with sinusitis.  Will treat sinusitis based off of length of symptoms.  Patient received Depo 80, prednisone, doxycycline.  Patient to finish Tamiflu which she was prescribed on 03/27/2018.  Influenza with sinusitis Assessment: Resolving influenza infection Persistent sinusitis With expiratory wheezes and rhonchi on lung exam  Plan: Depo 80 today   Prednisone 44m tablet  >>>4 tabs for 2 days, then 3 tabs for 2 days, 2 tabs for 2 days, then 1 tab for 2 days, then stop >>>take with food  >>>take in the morning   Doxycycline >>> 1 100 mg tablet every 12 hours for 7 days >>>take with food  >>>wear sunscreen   Finish Tamiflu as prescribed  Follow up in November / 2020 or sooner if symptoms worsen     BLauraine Rinne NP 03/31/2018   This appointment was 27 minutes along with over 50% of the time in direct face-to-face patient care, assessment, plan of care, and follow-up.

## 2018-03-31 NOTE — Assessment & Plan Note (Signed)
Assessment: Resolving influenza infection Persistent sinusitis With expiratory wheezes and rhonchi on lung exam  Plan: Depo 80 today   Prednisone 10mg  tablet  >>>4 tabs for 2 days, then 3 tabs for 2 days, 2 tabs for 2 days, then 1 tab for 2 days, then stop >>>take with food  >>>take in the morning   Doxycycline >>> 1 100 mg tablet every 12 hours for 7 days >>>take with food  >>>wear sunscreen   Finish Tamiflu as prescribed  Follow up in November / 2020 or sooner if symptoms worsen

## 2018-03-31 NOTE — Patient Instructions (Signed)
Depo 80 today   Prednisone 10mg  tablet  >>>4 tabs for 2 days, then 3 tabs for 2 days, 2 tabs for 2 days, then 1 tab for 2 days, then stop >>>take with food  >>>take in the morning   Doxycycline >>> 1 100 mg tablet every 12 hours for 7 days >>>take with food  >>>wear sunscreen   Follow up in November / 2020 or sooner if symptoms worsen  It is flu season:   >>>Remember to be washing your hands regularly, using hand sanitizer, be careful to use around herself with has contact with people who are sick will increase her chances of getting sick yourself. >>> Best ways to protect herself from the flu: Receive the yearly flu vaccine, practice good hand hygiene washing with soap and also using hand sanitizer when available, eat a nutritious meals, get adequate rest, hydrate appropriately   Please contact the office if your symptoms worsen or you have concerns that you are not improving.   Thank you for choosing [facility] for your healthcare, and for allowing [place] to partner with you on your healthcare journey. I am thankful to be able to provide care to you today.   Wyn Quaker FNP-C

## 2018-04-01 NOTE — Assessment & Plan Note (Signed)
CT abdomen and pelvis at [facility] 2019, indicated 2 mm left lung base nodule.  Never smoker.  Previous nodules were considered inflammatory.  She wants to be conservative with radiation exposure after all the imaging she has had. Plan-CXR

## 2018-04-01 NOTE — Assessment & Plan Note (Signed)
Mild persistent, uncomplicated. Because of insurance, we are changing Symbicort to Advair

## 2018-04-10 ENCOUNTER — Encounter: Admitting: Family Medicine

## 2018-05-01 ENCOUNTER — Encounter: Admitting: Family Medicine

## 2018-05-01 ENCOUNTER — Telehealth: Payer: Self-pay

## 2018-05-01 NOTE — Telephone Encounter (Signed)
Copied from [place] [id number]. Topic: General - Inquiry >> May 01, 2018  8:09 AM Scherrie Gerlach wrote: Reason for CRM: FYI :pt had to cancel her appt for today.  Pt states her husband is in [facility] with a brain bleed.  Was admitted Friday night and she had been there all weekend.just wanted you to know the reason she rescheduled this morning. >> May 01, 2018  9:33 AM Ralph Dowdy, CMA wrote: Does not look like she was charged No Show.  Just wanted to make Dr Raoul Pitch aware of patient's reason for canceling.

## 2018-05-16 DIAGNOSIS — G43B0 Ophthalmoplegic migraine, not intractable: Secondary | ICD-10-CM | POA: Diagnosis not present

## 2018-05-16 DIAGNOSIS — H524 Presbyopia: Secondary | ICD-10-CM | POA: Diagnosis not present

## 2018-05-16 DIAGNOSIS — H2513 Age-related nuclear cataract, bilateral: Secondary | ICD-10-CM | POA: Diagnosis not present

## 2018-05-25 ENCOUNTER — Encounter: Payer: TRICARE For Life (TFL) | Admitting: Family Medicine

## 2018-05-30 ENCOUNTER — Other Ambulatory Visit: Payer: Self-pay | Admitting: Internal Medicine

## 2018-06-01 ENCOUNTER — Telehealth: Payer: Self-pay | Admitting: Internal Medicine

## 2018-06-01 DIAGNOSIS — J449 Chronic obstructive pulmonary disease, unspecified: Secondary | ICD-10-CM

## 2018-06-01 MED ORDER — ALBUTEROL SULFATE (2.5 MG/3ML) 0.083% IN NEBU: 2.5000 mg | INHALATION_SOLUTION | Freq: Four times a day (QID) | RESPIRATORY_TRACT | 12 refills | Status: DC | PRN

## 2018-06-01 NOTE — Telephone Encounter (Signed)
Spoke with pt, she states she needs the tubing for her nebulizer? Can we send in order to [facility] please advise.  [facility]

## 2018-06-01 NOTE — Telephone Encounter (Signed)
Yes thanks 

## 2018-06-01 NOTE — Telephone Encounter (Signed)
.  Ok, order for nebulizer sent. Nothing further is needed.

## 2018-06-13 ENCOUNTER — Encounter: Payer: TRICARE For Life (TFL) | Admitting: Family Medicine

## 2018-06-13 ENCOUNTER — Ambulatory Visit (INDEPENDENT_AMBULATORY_CARE_PROVIDER_SITE_OTHER): Payer: Medicare Other | Admitting: Family Medicine

## 2018-06-13 ENCOUNTER — Encounter: Payer: Self-pay | Admitting: Family Medicine

## 2018-06-13 VITALS — BP 159/88 | HR 79 | Temp 97.5°F | Resp 16 | Ht 62.0 in | Wt 200.0 lb

## 2018-06-13 DIAGNOSIS — Z1239 Encounter for other screening for malignant neoplasm of breast: Secondary | ICD-10-CM | POA: Diagnosis not present

## 2018-06-13 DIAGNOSIS — E559 Vitamin D deficiency, unspecified: Secondary | ICD-10-CM

## 2018-06-13 DIAGNOSIS — F419 Anxiety disorder, unspecified: Secondary | ICD-10-CM | POA: Diagnosis not present

## 2018-06-13 DIAGNOSIS — Z23 Encounter for immunization: Secondary | ICD-10-CM | POA: Diagnosis not present

## 2018-06-13 DIAGNOSIS — R7309 Other abnormal glucose: Secondary | ICD-10-CM | POA: Diagnosis not present

## 2018-06-13 DIAGNOSIS — M8580 Other specified disorders of bone density and structure, unspecified site: Secondary | ICD-10-CM | POA: Diagnosis not present

## 2018-06-13 DIAGNOSIS — I10 Essential (primary) hypertension: Secondary | ICD-10-CM | POA: Diagnosis not present

## 2018-06-13 DIAGNOSIS — Z1231 Encounter for screening mammogram for malignant neoplasm of breast: Secondary | ICD-10-CM

## 2018-06-13 DIAGNOSIS — F329 Major depressive disorder, single episode, unspecified: Secondary | ICD-10-CM | POA: Diagnosis not present

## 2018-06-13 DIAGNOSIS — E785 Hyperlipidemia, unspecified: Secondary | ICD-10-CM | POA: Diagnosis not present

## 2018-06-13 DIAGNOSIS — Z85528 Personal history of other malignant neoplasm of kidney: Secondary | ICD-10-CM | POA: Diagnosis not present

## 2018-06-13 DIAGNOSIS — F32A Depression, unspecified: Secondary | ICD-10-CM

## 2018-06-13 LAB — VITAMIN D 25 HYDROXY (VIT D DEFICIENCY, FRACTURES): VITD: 43.8 ng/mL (ref 30.00–100.00)

## 2018-06-13 LAB — TSH: TSH: 1.8 u[IU]/mL (ref 0.35–4.50)

## 2018-06-13 LAB — LIPID PANEL
Cholesterol: 232 mg/dL — ABNORMAL HIGH (ref 0–200)
HDL: 67.4 mg/dL (ref >39.00)
LDL Cholesterol: 143 mg/dL — ABNORMAL HIGH (ref 0–99)
NONHDL: 165
Total CHOL/HDL Ratio: 3
Triglycerides: 110 mg/dL (ref 0.0–149.0)
VLDL: 22 mg/dL (ref 0.0–40.0)

## 2018-06-13 LAB — HEMOGLOBIN A1C: Hgb A1c MFr Bld: 5.7 % (ref 4.6–6.5)

## 2018-06-13 MED ORDER — ZOSTER VAC RECOMB ADJUVANTED 50 MCG/0.5ML IM SUSR: 0.5000 mL | Freq: Once | INTRAMUSCULAR | 1 refills | Status: AC

## 2018-06-13 MED ORDER — HYDROCHLOROTHIAZIDE 25 MG PO TABS: 25.0000 mg | ORAL_TABLET | Freq: Every day | ORAL | 1 refills | Status: DC

## 2018-06-13 MED ORDER — HYDROCHLOROTHIAZIDE 25 MG PO TABS: 25.0000 mg | ORAL_TABLET | Freq: Every day | ORAL | 0 refills | Status: DC

## 2018-06-13 NOTE — Patient Instructions (Addendum)
Monitor your BP when not taking mucinex D. Your BP is elevated today. This could be from not taking med and taking The "D" format. Coricidn or plan mucinex or DM version is safe with BP. Allegra is safe allergy med that does not cause drowsiness.   Health Maintenance, Female Adopting a healthy lifestyle and getting preventive care can go a long way to promote health and wellness. Talk with your health care provider about what schedule of regular examinations is right for you. This is a good chance for you to check in with your provider about disease prevention and staying healthy. In between checkups, there are plenty of things you can do on your own. Experts have done a lot of research about which lifestyle changes and preventive measures are most likely to keep you healthy. Ask your health care provider for more information. Weight and diet Eat a healthy diet  Be sure to include plenty of vegetables, fruits, low-fat dairy products, and lean protein.  Do not eat a lot of foods high in solid fats, added sugars, or salt.  Get regular exercise. This is one of the most important things you can do for your health. ? Most adults should exercise for at least 150 minutes each week. The exercise should increase your heart rate and make you sweat (moderate-intensity exercise). ? Most adults should also do strengthening exercises at least twice a week. This is in addition to the moderate-intensity exercise. Maintain a healthy weight  Body mass index (BMI) is a measurement that can be used to identify possible weight problems. It estimates body fat based on height and weight. Your health care provider can help determine your BMI and help you achieve or maintain a healthy weight.  For females 39 years of age and older: ? A BMI below 18.5 is considered underweight. ? A BMI of 18.5 to 24.9 is normal. ? A BMI of 25 to 29.9 is considered overweight. ? A BMI of 30 and above is considered obese. Watch  levels of cholesterol and blood lipids  You should start having your blood tested for lipids and cholesterol at 58 years of age, then have this test every 5 years.  You may need to have your cholesterol levels checked more often if: ? Your lipid or cholesterol levels are high. ? You are older than 58 years of age. ? You are at high risk for heart disease. Cancer screening Lung Cancer  Lung cancer screening is recommended for adults 45-35 years old who are at high risk for lung cancer because of a history of smoking.  A yearly low-dose CT scan of the lungs is recommended for people who: ? Currently smoke. ? Have quit within the past 15 years. ? Have at least a 30-pack-year history of smoking. A pack year is smoking an average of one pack of cigarettes a day for 1 year.  Yearly screening should continue until it has been 15 years since you quit.  Yearly screening should stop if you develop a health problem that would prevent you from having lung cancer treatment. Breast Cancer  Practice breast self-awareness. This means understanding how your breasts normally appear and feel.  It also means doing regular breast self-exams. Let your health care provider know about any changes, no matter how small.  If you are in your 20s or 30s, you should have a clinical breast exam (CBE) by a health care provider every 1-3 years as part of a regular health exam.  If you  are 79 or older, have a CBE every year. Also consider having a breast X-ray (mammogram) every year.  If you have a family history of breast cancer, talk to your health care provider about genetic screening.  If you are at high risk for breast cancer, talk to your health care provider about having an MRI and a mammogram every year.  Breast cancer gene (BRCA) assessment is recommended for women who have family members with BRCA-related cancers. BRCA-related cancers include: ? Breast. ? Ovarian. ? Tubal. ? Peritoneal  cancers.  Results of the assessment will determine the need for genetic counseling and BRCA1 and BRCA2 testing. Cervical Cancer Your health care provider may recommend that you be screened regularly for cancer of the pelvic organs (ovaries, uterus, and vagina). This screening involves a pelvic examination, including checking for microscopic changes to the surface of your cervix (Pap test). You may be encouraged to have this screening done every 3 years, beginning at age 97.  For women ages 59-65, health care providers may recommend pelvic exams and Pap testing every 3 years, or they may recommend the Pap and pelvic exam, combined with testing for human papilloma virus (HPV), every 5 years. Some types of HPV increase your risk of cervical cancer. Testing for HPV may also be done on women of any age with unclear Pap test results.  Other health care providers may not recommend any screening for nonpregnant women who are considered low risk for pelvic cancer and who do not have symptoms. Ask your health care provider if a screening pelvic exam is right for you.  If you have had past treatment for cervical cancer or a condition that could lead to cancer, you need Pap tests and screening for cancer for at least 20 years after your treatment. If Pap tests have been discontinued, your risk factors (such as having a new sexual partner) need to be reassessed to determine if screening should resume. Some women have medical problems that increase the chance of getting cervical cancer. In these cases, your health care provider may recommend more frequent screening and Pap tests. Colorectal Cancer  This type of cancer can be detected and often prevented.  Routine colorectal cancer screening usually begins at 58 years of age and continues through 58 years of age.  Your health care provider may recommend screening at an earlier age if you have risk factors for colon cancer.  Your health care provider may also  recommend using home test kits to check for hidden blood in the stool.  A small camera at the end of a tube can be used to examine your colon directly (sigmoidoscopy or colonoscopy). This is done to check for the earliest forms of colorectal cancer.  Routine screening usually begins at age 37.  Direct examination of the colon should be repeated every 5-10 years through 58 years of age. However, you may need to be screened more often if early forms of precancerous polyps or small growths are found. Skin Cancer  Check your skin from head to toe regularly.  Tell your health care provider about any new moles or changes in moles, especially if there is a change in a mole's shape or color.  Also tell your health care provider if you have a mole that is larger than the size of a pencil eraser.  Always use sunscreen. Apply sunscreen liberally and repeatedly throughout the day.  Protect yourself by wearing long sleeves, pants, a wide-brimmed hat, and sunglasses whenever you are outside.  Heart disease, diabetes, and high blood pressure  High blood pressure causes heart disease and increases the risk of stroke. High blood pressure is more likely to develop in: ? People who have blood pressure in the high end of the normal range (130-139/85-89 mm Hg). ? People who are overweight or obese. ? People who are African American.  If you are 53-43 years of age, have your blood pressure checked every 3-5 years. If you are 62 years of age or older, have your blood pressure checked every year. You should have your blood pressure measured twice-once when you are at a hospital or clinic, and once when you are not at a hospital or clinic. Record the average of the two measurements. To check your blood pressure when you are not at a hospital or clinic, you can use: ? An automated blood pressure machine at a pharmacy. ? A home blood pressure monitor.  If you are between 38 years and 33 years old, ask your health  care provider if you should take aspirin to prevent strokes.  Have regular diabetes screenings. This involves taking a blood sample to check your fasting blood sugar level. ? If you are at a normal weight and have a low risk for diabetes, have this test once every three years after 58 years of age. ? If you are overweight and have a high risk for diabetes, consider being tested at a younger age or more often. Preventing infection Hepatitis B  If you have a higher risk for hepatitis B, you should be screened for this virus. You are considered at high risk for hepatitis B if: ? You were born in a country where hepatitis B is common. Ask your health care provider which countries are considered high risk. ? Your parents were born in a high-risk country, and you have not been immunized against hepatitis B (hepatitis B vaccine). ? You have HIV or AIDS. ? You use needles to inject street drugs. ? You live with someone who has hepatitis B. ? You have had sex with someone who has hepatitis B. ? You get hemodialysis treatment. ? You take certain medicines for conditions, including cancer, organ transplantation, and autoimmune conditions. Hepatitis C  Blood testing is recommended for: ? Everyone born from [birth year] through 1965. ? Anyone with known risk factors for hepatitis C. Sexually transmitted infections (STIs)  You should be screened for sexually transmitted infections (STIs) including gonorrhea and chlamydia if: ? You are sexually active and are younger than 58 years of age. ? You are older than 57 years of age and your health care provider tells you that you are at risk for this type of infection. ? Your sexual activity has changed since you were last screened and you are at an increased risk for chlamydia or gonorrhea. Ask your health care provider if you are at risk.  If you do not have HIV, but are at risk, it may be recommended that you take a prescription medicine daily to prevent HIV  infection. This is called pre-exposure prophylaxis (PrEP). You are considered at risk if: ? You are sexually active and do not regularly use condoms or know the HIV status of your partner(s). ? You take drugs by injection. ? You are sexually active with a partner who has HIV. Talk with your health care provider about whether you are at high risk of being infected with HIV. If you choose to begin PrEP, you should first be tested for HIV. You should then  be tested every 3 months for as long as you are taking PrEP. Pregnancy  If you are premenopausal and you may become pregnant, ask your health care provider about preconception counseling.  If you may become pregnant, take 400 to 800 micrograms (mcg) of folic acid every day.  If you want to prevent pregnancy, talk to your health care provider about birth control (contraception). Osteoporosis and menopause  Osteoporosis is a disease in which the bones lose minerals and strength with aging. This can result in serious bone fractures. Your risk for osteoporosis can be identified using a bone density scan.  If you are 10 years of age or older, or if you are at risk for osteoporosis and fractures, ask your health care provider if you should be screened.  Ask your health care provider whether you should take a calcium or vitamin D supplement to lower your risk for osteoporosis.  Menopause may have certain physical symptoms and risks.  Hormone replacement therapy may reduce some of these symptoms and risks. Talk to your health care provider about whether hormone replacement therapy is right for you. Follow these instructions at home:  Schedule regular health, dental, and eye exams.  Stay current with your immunizations.  Do not use any tobacco products including cigarettes, chewing tobacco, or electronic cigarettes.  If you are pregnant, do not drink alcohol.  If you are breastfeeding, limit how much and how often you drink alcohol.  Limit  alcohol intake to no more than 1 drink per day for nonpregnant women. One drink equals 12 ounces of beer, 5 ounces of wine, or 1 ounces of hard liquor.  Do not use street drugs.  Do not share needles.  Ask your health care provider for help if you need support or information about quitting drugs.  Tell your health care provider if you often feel depressed.  Tell your health care provider if you have ever been abused or do not feel safe at home. This information is not intended to replace advice given to you by your health care provider. Make sure you discuss any questions you have with your health care provider. Document Released: 10/05/2010 Document Revised: 08/28/2015 Document Reviewed: 12/24/2014 Elsevier Interactive Patient Education  2019 [employer].

## 2018-06-13 NOTE — Progress Notes (Signed)
Patient ID: [name], female  DOB: 05-Sep-1960, 58 y.o.   MRN: [id number] Patient Care Team    Relationship Specialty Notifications Start End  Ma Hillock, DO PCP - General Family Medicine  03/05/15   Gwendel Hanson, MD Referring Physician Urology  04/08/17   Aviva Signs, MD Referring Physician Gastroenterology  04/08/17   Deneise Lever, MD Consulting Physician Pulmonary Disease  04/08/17     Chief Complaint  Patient presents with  . Annual Exam    Not fasting. Pt has not ate anything but accidentally added electrolytes to water to take medicaiton. Need refills on medications. HCTZ only has one pill left and needs enough  sent to local pharmacy until [facility] sends RX     Subjective:  [name] is a 58 y.o.  Female  present for CPE. All past medical history, surgical history, allergies, family history, immunizations, medications and social history were updated in the electronic medical record today. All recent labs, ED visits and hospitalizations within the last year were reviewed.  Hypertension/hyperlipidemia/BMI 30/elevated a1c: Pt reports compliance with HCTZ 25 mg QD. Blood pressures ranges at home WNL. She took a sinus med this morning and no HCTZ.  Patient denies chest pain, shortness of breath, dizziness or lower extremity edema.  Pt does not take a  daily baby ASA. Pt is not prescribed statin. BMP: 12//2019, chronically elevated liver enzymes CBC: 03/2018 within normal limits Lipid: 04/08/2017 total cholesterol 239, HDL 64, LDL 149, triglycerides 128 TSH: 2.18 04/2017 A1c: 5.8  04/2017 Diet: Cutting out soda, sugar Exercise: routinely.  RF: HTN, HLD, obesity, FHx HD Lost 4 lbs. Since last visit.   H/O RCC:  Pt has a h/o RCC left kidney with partial nephrectomy and RCC right kidney with cryoablation.  Since her last visit she has undergone some more changes with her renal cell carcinoma. She is following with specialties  routinely.     Health maintenance: updated 06/13/18 Colonoscopy: No family history. Personal history of colitis. Last colonoscopy 01/2016, follow-up every 5 years or colitis. [facility] Mammogram: No family history. Last mammogram 05/2017, [facility].  Cervical cancer screening: Last Pap 03/24/2015, normal, follow every 5 yr. HPV neg.  Immunizations: Tdap 03/2015 UTD,  flu shot UTD 2019. PNA series completed. Shingrix printed Infectious disease screening: HIV and Hep C completed.  DEXA: DEXA 05/13/2016 osteopenia. Rpt due 2021  Assistive device: none Oxygen use: none Patient has a Dental home. Hospitalizations/ED visits: reviewed  Depression screen [facility] 2/9 06/13/2018 04/08/2017 03/24/2016  Decreased Interest 1 0 0  Down, Depressed, Hopeless 1 0 0  PHQ - 2 Score 2 0 0  Altered sleeping 3 - -  Tired, decreased energy 2 - -  Change in appetite 2 - -  Feeling bad or failure about yourself  0 - -  Trouble concentrating 2 - -  Moving slowly or fidgety/restless 0 - -  Suicidal thoughts 0 - -  PHQ-9 Score 11 - -  Difficult doing work/chores Not difficult at all - -   GAD 7 : Generalized Anxiety Score 06/13/2018  Nervous, Anxious, on Edge 2  Control/stop worrying 1  Worry too much - different things 1  Trouble relaxing 0  Restless 0  Easily annoyed or irritable 2  Afraid - awful might happen 1  Total GAD 7 Score 7  Anxiety Difficulty Not difficult at all    Immunization History  Administered Date(s) Administered  . Influenza  Split 01/24/2012, 01/06/2013, 01/03/2014, 01/15/2015  . Influenza,inj,Quad PF,6+ Mos 12/16/2014, 01/06/2016, 11/25/2016, 12/15/2017  . Pneumococcal Conjugate-13 03/24/2015  . Pneumococcal Polysaccharide-23 12/17/2013  . Tdap 03/24/2015     Past Medical History:  Diagnosis Date  . Anxiety    psychiatrist: Dr. Robina Ade   . Arthritis   . Colitis   . Colon polyps   . Depression    psychiatrist: Dr. Robina Ade  . Elevated liver enzymes      Dr. Rulon Abide following  . Fatty liver   . GERD (gastroesophageal reflux disease)   . Heart murmur   . History of chicken pox   . Renal cell carcinoma    hx of left and right; Dr. Nena Alexander   No Known Allergies Past Surgical History:  Procedure Laterality Date  . CHOLECYSTECTOMY    . FETAL RADIO FREQUENCY ABLATION    . FRACTURE SURGERY Right    ankle  . JOINT REPLACEMENT  2009   knee  . NEPHRECTOMY     partial LT  . TONSILLECTOMY     Family History  Problem Relation Age of Onset  . Hypertension Mother   . Alzheimer's disease Mother   . Arthritis Mother   . Hypertension Father   . Alzheimer's disease Father   . COPD Father   . Heart disease Father   . Arthritis Father   . Hypertension Sister    Social History   Social History Narrative   Married. 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   Lives with her husband, son and granddaughter.   Drinks caffeinated beverages.   Wears her seatbelt, wears a bicycle helmet, there is a smoke detector in home. There are no firearms in the home.   Patient feels safe in her relationships.    Allergies as of 06/13/2018   No Known Allergies     Medication List       Accurate as of June 13, 2018 12:23 PM. Always use your most recent med list.        Fluticasone-Salmeterol 100-50 MCG/DOSE Aepb Commonly known as:  Advair Diskus Inhale 1 puff then rinse mouth, twice daily   hydrochlorothiazide 25 MG tablet Commonly known as:  HYDRODIURIL Take 1 tablet (25 mg total) by mouth daily.   OLANZapine-FLUoxetine 3-25 MG capsule Commonly known as:  SYMBYAX Take 1 capsule by mouth every evening.   potassium chloride SA 20 MEQ tablet Commonly known as:  K-DUR,KLOR-CON TAKE 1 TABLET TWICE A DAY   Ventolin HFA 108 (90 Base) MCG/ACT inhaler Generic drug:  albuterol USE 1 TO 2 INHALATIONS EVERY 6 HOURS AS NEEDED FOR WHEEZING OR SHORTNESS OF BREATH   albuterol (2.5 MG/3ML) 0.083% nebulizer  solution Commonly known as:  PROVENTIL Take 3 mLs (2.5 mg total) by nebulization every 6 (six) hours as needed for wheezing or shortness of breath.   Vitamin D-3 125 MCG (5000 UT) Tabs Take 5,000 Units by mouth daily.   Zoster Vaccine Adjuvanted injection Commonly known as:  SHINGRIX Inject 0.5 mLs into the muscle once for 1 dose. Rpt dose in 2-6 months       All past medical history, surgical history, allergies, family history, immunizations andmedications were updated in the EMR today and reviewed under the history and medication portions of their EMR.      ROS: 14 pt review of systems performed and negative (unless mentioned in an HPI)  Objective: BP (!) 159/88 (BP Location: Right Arm, Patient Position: Sitting, Cuff Size: Normal)  Pulse 79   Temp (!) 97.5 F (36.4 C) (Oral)   Resp 16   Ht 5\' 2"  (1.575 m)   Wt 200 lb (90.7 kg)   LMP 04/22/2013   SpO2 98%   BMI 36.58 kg/m  Gen: Afebrile. No acute distress. Nontoxic in appearance, well-developed, well-nourished,  Obese caucasian female.  HENT: AT. [place]. Bilateral TM visualized and normal in appearance, normal external auditory canal. MMM, no oral lesions, adequate dentition. Bilateral nares within normal limits. Throat without erythema, ulcerations or exudates. no Cough on exam, no hoarseness on exam. Eyes:Pupils Equal Round Reactive to light, Extraocular movements intact,  Conjunctiva without redness, discharge or icterus. Neck/lymp/endocrine: Supple,no lymphadenopathy, no thyromegaly CV: RRR no murmur, no edema, +2/4 P posterior tibialis pulses. no carotid bruits. No JVD. Chest: CTAB, no wheeze, rhonchi or crackles. normal Respiratory effort. good Air movement. Abd: Soft. obese. NTND. BS present. no Masses palpated. No hepatosplenomegaly. No rebound tenderness or guarding. Skin: no rashes, purpura or petechiae. Warm and well-perfused. Skin intact. Neuro/Msk: Normal gait. PERLA. EOMi. Alert. Oriented x3.  Cranial nerves II  through XII intact. Muscle strength 5/5 upper/lower extremity. DTRs equal bilaterally. Psych: Normal affect, dress and demeanor. Normal speech. Normal thought content and judgment.   No exam data present  Assessment/plan: [name] is a 58 y.o. female present for CPE. Anxiety and depression Follows w/ psych Elevated hemoglobin A1c Diet and exercise encouraged - Hemoglobin A1c Essential hypertension, benign/Hyperlipidemia, unspecified hyperlipidemia type/Morbid obesity ([place]) - above goal- she has not taken her meds today and also took and OTC sinus med with decongestant.  - Comprehensive metabolic panel and CBC reviewed in care everywhere tab from 03/2018 - Lipid panel - TSH - monitor at home, with BP medication on board and no decongestant - if > 135/85--> followup for BP management.  - f/u 6 months Breast cancer screening by mammogram - MM 3D SCREEN BREAST BILATERAL; Future History of renal cell cancer Following w/ specialist. Seeing Dr. Iona Beard q 6 months. She underwent cryoablation 01/11/2018.  Vitamin D deficiency - continue supplement OTC. - Vitamin D (25 hydroxy) Osteopenia, unspecified location DEXA Due 2021 Shingles vaccine: Printed for pt.   Return in about 6 months (around 12/14/2018) for [facility].   Greater than 40 minutes spent with patient, >50% of time spent face to face  Electronically signed by: Howard Pouch, [place]

## 2018-06-14 ENCOUNTER — Telehealth: Payer: Self-pay | Admitting: Family Medicine

## 2018-06-14 NOTE — Telephone Encounter (Signed)
Pt was called and given lab results. Pt verbalized understanding and at this time refuses to go on Statin. Pt would rather continue to monitor.   Pt verbalized understanding on keeping [facility] appt every 6 months and she stated she would need to call back to schedule the medicare wellness exam

## 2018-06-14 NOTE — Telephone Encounter (Signed)
Please inform patient the following information: Her labs all looked good except her cholesterol and it was the same as last year.  We could try a low dose statin and watch her liver enzymes 4 weeks after start of medicine. Since it is believed her elevated LFT are from fatty liver only - statins can help.  If agreeable to start will prescribe med for her and she will need to make lab appt in 4 weeks.  Please advise.    Also, will her now having Medicare and tricare secondary, she does not get a "physical."  I changed her office appt code from yesterday so she will not get charged for a physical that her insurance will not cover. In the future, She will have a medicare wellness visit with our healthcoach Maudie Mercury) and see me twice a year for her chronic problems.  Please explain the difference to her.   Medicare wellness cover all her preventive health screenings and immunizations etc

## 2018-07-13 DIAGNOSIS — M25552 Pain in left hip: Secondary | ICD-10-CM | POA: Diagnosis not present

## 2018-07-24 DIAGNOSIS — M25552 Pain in left hip: Secondary | ICD-10-CM | POA: Diagnosis not present

## 2018-08-01 DIAGNOSIS — S76012D Strain of muscle, fascia and tendon of left hip, subsequent encounter: Secondary | ICD-10-CM | POA: Diagnosis not present

## 2018-08-01 DIAGNOSIS — M7062 Trochanteric bursitis, left hip: Secondary | ICD-10-CM | POA: Diagnosis not present

## 2018-08-01 DIAGNOSIS — M25552 Pain in left hip: Secondary | ICD-10-CM | POA: Diagnosis not present

## 2018-09-11 DIAGNOSIS — Z08 Encounter for follow-up examination after completed treatment for malignant neoplasm: Secondary | ICD-10-CM | POA: Diagnosis not present

## 2018-09-11 DIAGNOSIS — C641 Malignant neoplasm of right kidney, except renal pelvis: Secondary | ICD-10-CM | POA: Diagnosis not present

## 2018-09-11 DIAGNOSIS — Z905 Acquired absence of kidney: Secondary | ICD-10-CM | POA: Diagnosis not present

## 2018-09-11 DIAGNOSIS — Z8553 Personal history of malignant neoplasm of renal pelvis: Secondary | ICD-10-CM | POA: Diagnosis not present

## 2018-09-11 DIAGNOSIS — K760 Fatty (change of) liver, not elsewhere classified: Secondary | ICD-10-CM | POA: Diagnosis not present

## 2018-09-20 NOTE — Patient Instructions (Addendum)
[name]    Your procedure is scheduled on: 09-27-2018  Report to [facility] Main  Entrance              Report to admitting at 245 PM   [name] 19 TEST ON_______ @_______ , THIS TEST MUST BE DONE BEFORE SURGERY, COME TO [place]. ONCE YOUR COVID TEST IS COMPLETED, PLEASE BEGIN THE QUARANTINE INSTRUCTIONS AS OUTLINED IN YOUR HANDOUT.   Call this number if you have problems the morning of surgery [phone]    Remember: [place], NO CHEWING GUM CANDY OR MINTS.   NO SOLID FOOD AFTER MIDNIGHT THE NIGHT PRIOR TO SURGERY. NOTHING BY MOUTH EXCEPT CLEAR LIQUIDS UNTIL145 PM . PLEASE FINISH ENSURE DRINK PER SURGEON ORDER 3 HOURS PRIOR TO SCHEDULED SURGERY TIME WHICH NEEDS TO BE COMPLETED AT 145 PM.    CLEAR LIQUID DIET   Foods Allowed                                                                     Foods Excluded  Coffee and tea, regular and decaf                             liquids that you cannot  Plain Jell-O in any flavor                                             see through such as: Fruit ices (not with fruit pulp)                                     milk, soups, orange juice  Iced Popsicles                                    All solid food Carbonated beverages, regular and diet                                    Cranberry, grape and apple juices Sports drinks like Gatorade Lightly seasoned clear broth or consume(fat free) Sugar, honey syrup  Sample Menu Breakfast                                Lunch                                     Supper Cranberry juice                    Beef broth  Chicken broth Jell-O                                     Grape juice                           Apple juice Coffee or tea                        Jell-O                                      Popsicle                     Coffee or tea                        Coffee or tea  _____________________________________________________________________    Take these medicines the morning of surgery with A SIP OF WATER: ALPRAZOLAM (XANAX) IF NEEDED, ALBUTEROL NEBULIZER, ADVAIR, VENTOLIN INHALER IF NEEDED AND BRING INHALER                                You may not have any metal on your body including hair pins and              piercings  Do not wear jewelry, make-up, lotions, powders or perfumes, deodorant             Do not wear nail polish.  Do not shave  48 hours prior to surgery.              Do not bring valuables to the hospital. [place].  Contacts, dentures or bridgework may not be worn into surgery.  Leave suitcase in the car. After surgery it may be brought to your room.      _____________________________________________________________________             [facility] - Preparing for Surgery Before surgery, you can play an important role.  Because skin is not sterile, your skin needs to be as free of germs as possible.  You can reduce the number of germs on your skin by washing with CHG (chlorahexidine gluconate) soap before surgery.  CHG is an antiseptic cleaner which kills germs and bonds with the skin to continue killing germs even after washing. Please DO NOT use if you have an allergy to CHG or antibacterial soaps.  If your skin becomes reddened/irritated stop using the CHG and inform your nurse when you arrive at Short Stay. Do not shave (including legs and underarms) for at least 48 hours prior to the first CHG shower.  You may shave your face/neck. Please follow these instructions carefully:  1.  Shower with CHG Soap the night before surgery and the  morning of Surgery.  2.  If you choose to wash your hair, wash your hair first as usual with your  normal  shampoo.  3.  After you shampoo, rinse your hair and body thoroughly to  remove the  shampoo.  4.  Use CHG as you would any other liquid soap.  You can apply chg directly  to the skin and wash                       Gently with a scrungie or clean washcloth.  5.  Apply the CHG Soap to your body ONLY FROM THE NECK DOWN.   Do not use on face/ open                           Wound or open sores. Avoid contact with eyes, ears mouth and genitals (private parts).                       Wash face,  Genitals (private parts) with your normal soap.             6.  Wash thoroughly, paying special attention to the area where your surgery  will be performed.  7.  Thoroughly rinse your body with warm water from the neck down.  8.  DO NOT shower/wash with your normal soap after using and rinsing off  the CHG Soap.                9.  Pat yourself dry with a clean towel.            10.  Wear clean pajamas.            11.  Place clean sheets on your bed the night of your first shower and do not  sleep with pets. Day of Surgery : Do not apply any lotions/deodorants the morning of surgery.  Please wear clean clothes to the hospital/surgery center.  FAILURE TO FOLLOW THESE INSTRUCTIONS MAY RESULT IN THE CANCELLATION OF YOUR SURGERY PATIENT SIGNATURE_________________________________  NURSE SIGNATURE__________________________________  ________________________________________________________________________   Adam Phenix  An incentive spirometer is a tool that can help keep your lungs clear and active. This tool measures how well you are filling your lungs with each breath. Taking long deep breaths may help reverse or decrease the chance of developing breathing (pulmonary) problems (especially infection) following:  A long period of time when you are unable to move or be active. BEFORE THE PROCEDURE   If the spirometer includes an indicator to show your best effort, your nurse or respiratory therapist will set it to a desired goal.  If possible, sit  up straight or lean slightly forward. Try not to slouch.  Hold the incentive spirometer in an upright position. INSTRUCTIONS FOR USE  1. Sit on the edge of your bed if possible, or sit up as far as you can in bed or on a chair. 2. Hold the incentive spirometer in an upright position. 3. Breathe out normally. 4. Place the mouthpiece in your mouth and seal your lips tightly around it. 5. Breathe in slowly and as deeply as possible, raising the piston or the ball toward the top of the column. 6. Hold your breath for 3-5 seconds or for as long as possible. Allow the piston or ball to fall to the bottom of the column. 7. Remove the mouthpiece from your mouth and breathe out normally. 8. Rest for a few seconds and repeat Steps 1 through 7 at least 10 times every 1-2 hours when you are awake. Take your time and take a few normal breaths between deep breaths. 9. The spirometer may include an indicator to show  your best effort. Use the indicator as a goal to work toward during each repetition. 10. After each set of 10 deep breaths, practice coughing to be sure your lungs are clear. If you have an incision (the cut made at the time of surgery), support your incision when coughing by placing a pillow or rolled up towels firmly against it. Once you are able to get out of bed, walk around indoors and cough well. You may stop using the incentive spirometer when instructed by your caregiver.  RISKS AND COMPLICATIONS  Take your time so you do not get dizzy or light-headed.  If you are in pain, you may need to take or ask for pain medication before doing incentive spirometry. It is harder to take a deep breath if you are having pain. AFTER USE  Rest and breathe slowly and easily.  It can be helpful to keep track of a log of your progress. Your caregiver can provide you with a simple table to help with this. If you are using the spirometer at home, follow these instructions: [place] IF:   You are  having difficultly using the spirometer.  You have trouble using the spirometer as often as instructed.  Your pain medication is not giving enough relief while using the spirometer.  You develop fever of 100.5 F (38.1 C) or higher. SEEK IMMEDIATE MEDICAL CARE IF:   You cough up bloody sputum that had not been present before.  You develop fever of 102 F (38.9 C) or greater.  You develop worsening pain at or near the incision site. MAKE SURE YOU:   Understand these instructions.  Will watch your condition.  Will get help right away if you are not doing well or get worse. Document Released: 08/02/2006 Document Revised: 06/14/2011 Document Reviewed: 10/03/2006 [facility] Patient Information 2014 [place], [place].   ________________________________________________________________________

## 2018-09-21 ENCOUNTER — Encounter (HOSPITAL_COMMUNITY): Payer: Self-pay

## 2018-09-21 ENCOUNTER — Encounter (HOSPITAL_COMMUNITY)
Admission: RE | Admit: 2018-09-21 | Discharge: 2018-09-21 | Disposition: A | Discharge status: Home / Self Care | Payer: Medicare Other | Source: Ambulatory Visit | Attending: Orthopedic Surgery | Admitting: Orthopedic Surgery

## 2018-09-21 ENCOUNTER — Other Ambulatory Visit: Payer: Self-pay

## 2018-09-21 DIAGNOSIS — Z01818 Encounter for other preprocedural examination: Secondary | ICD-10-CM | POA: Diagnosis not present

## 2018-09-21 HISTORY — DX: Personal history of urinary calculi: Z87.442

## 2018-09-21 HISTORY — DX: Chronic obstructive pulmonary disease, unspecified: J44.9

## 2018-09-21 LAB — CBC
HCT: 43.3 % (ref 36.0–46.0)
Hemoglobin: 13.5 g/dL (ref 12.0–15.0)
MCH: 28.7 pg (ref 26.0–34.0)
MCHC: 31.2 g/dL (ref 30.0–36.0)
MCV: 91.9 fL (ref 80.0–100.0)
Platelets: 321 10*3/uL (ref 150–400)
RBC: 4.71 MIL/uL (ref 3.87–5.11)
RDW: 15.5 % (ref 11.5–15.5)
WBC: 5.1 10*3/uL (ref 4.0–10.5)
nRBC: 0 % (ref 0.0–0.2)

## 2018-09-21 LAB — BASIC METABOLIC PANEL
Anion gap: 11 (ref 5–15)
BUN: 15 mg/dL (ref 6–20)
CO2: 26 mmol/L (ref 22–32)
Calcium: 10.1 mg/dL (ref 8.9–10.3)
Chloride: 104 mmol/L (ref 98–111)
Creatinine, Ser: 0.79 mg/dL (ref 0.44–1.00)
GFR calc Af Amer: >60 mL/min (ref >60)
GFR calc non Af Amer: >60 mL/min (ref >60)
Glucose, Bld: 101 mg/dL — ABNORMAL HIGH (ref 70–99)
Potassium: 3.6 mmol/L (ref 3.5–5.1)
Sodium: 141 mmol/L (ref 135–145)

## 2018-09-21 NOTE — Progress Notes (Signed)
lov Dr. Annamaria Boots 02-02-18 Oncology Dr. Woody Seller 09-11-18

## 2018-09-23 ENCOUNTER — Other Ambulatory Visit (HOSPITAL_COMMUNITY)
Admission: RE | Admit: 2018-09-23 | Discharge: 2018-09-23 | Disposition: A | Discharge status: Home / Self Care | Payer: Medicare Other | Source: Ambulatory Visit | Attending: Orthopedic Surgery | Admitting: Orthopedic Surgery

## 2018-09-23 DIAGNOSIS — Z01818 Encounter for other preprocedural examination: Secondary | ICD-10-CM | POA: Diagnosis not present

## 2018-09-23 LAB — SARS CORONAVIRUS 2 (TAT 6-24 HRS): SARS Coronavirus 2: NEGATIVE

## 2018-09-26 MED ORDER — BUPIVACAINE LIPOSOME 1.3 % IJ SUSP
20.0000 mL | Freq: Once | INTRAMUSCULAR | Status: DC
  Filled 2018-09-26: qty 20

## 2018-09-27 ENCOUNTER — Ambulatory Visit (HOSPITAL_COMMUNITY): Payer: Medicare Other | Admitting: Certified Registered"

## 2018-09-27 ENCOUNTER — Encounter (HOSPITAL_COMMUNITY)
Admission: RE | Disposition: A | Discharge status: Home / Self Care | Payer: Self-pay | Source: Home / Self Care | Attending: Orthopedic Surgery

## 2018-09-27 ENCOUNTER — Observation Stay (HOSPITAL_COMMUNITY)
Admission: RE | Admit: 2018-09-27 | Discharge: 2018-09-28 | Disposition: A | Discharge status: Home / Self Care | Payer: Medicare Other | Attending: Orthopedic Surgery | Admitting: Orthopedic Surgery

## 2018-09-27 ENCOUNTER — Ambulatory Visit (HOSPITAL_COMMUNITY): Payer: Medicare Other | Admitting: Physician Assistant

## 2018-09-27 ENCOUNTER — Encounter (HOSPITAL_COMMUNITY): Payer: Self-pay | Admitting: *Deleted

## 2018-09-27 ENCOUNTER — Other Ambulatory Visit: Payer: Self-pay

## 2018-09-27 DIAGNOSIS — R011 Cardiac murmur, unspecified: Secondary | ICD-10-CM | POA: Diagnosis not present

## 2018-09-27 DIAGNOSIS — Z9049 Acquired absence of other specified parts of digestive tract: Secondary | ICD-10-CM | POA: Diagnosis not present

## 2018-09-27 DIAGNOSIS — K760 Fatty (change of) liver, not elsewhere classified: Secondary | ICD-10-CM | POA: Diagnosis not present

## 2018-09-27 DIAGNOSIS — F418 Other specified anxiety disorders: Secondary | ICD-10-CM | POA: Diagnosis not present

## 2018-09-27 DIAGNOSIS — Z79899 Other long term (current) drug therapy: Secondary | ICD-10-CM | POA: Insufficient documentation

## 2018-09-27 DIAGNOSIS — M7062 Trochanteric bursitis, left hip: Secondary | ICD-10-CM | POA: Diagnosis present

## 2018-09-27 DIAGNOSIS — M1990 Unspecified osteoarthritis, unspecified site: Secondary | ICD-10-CM | POA: Insufficient documentation

## 2018-09-27 DIAGNOSIS — F329 Major depressive disorder, single episode, unspecified: Secondary | ICD-10-CM | POA: Diagnosis not present

## 2018-09-27 DIAGNOSIS — Z7982 Long term (current) use of aspirin: Secondary | ICD-10-CM | POA: Insufficient documentation

## 2018-09-27 DIAGNOSIS — Z96652 Presence of left artificial knee joint: Secondary | ICD-10-CM | POA: Diagnosis not present

## 2018-09-27 DIAGNOSIS — M7602 Gluteal tendinitis, left hip: Secondary | ICD-10-CM | POA: Insufficient documentation

## 2018-09-27 DIAGNOSIS — Z905 Acquired absence of kidney: Secondary | ICD-10-CM | POA: Insufficient documentation

## 2018-09-27 DIAGNOSIS — S76012A Strain of muscle, fascia and tendon of left hip, initial encounter: Secondary | ICD-10-CM | POA: Insufficient documentation

## 2018-09-27 DIAGNOSIS — M7072 Other bursitis of hip, left hip: Secondary | ICD-10-CM | POA: Diagnosis not present

## 2018-09-27 DIAGNOSIS — F419 Anxiety disorder, unspecified: Secondary | ICD-10-CM | POA: Insufficient documentation

## 2018-09-27 DIAGNOSIS — Z7951 Long term (current) use of inhaled steroids: Secondary | ICD-10-CM | POA: Diagnosis not present

## 2018-09-27 DIAGNOSIS — X58XXXA Exposure to other specified factors, initial encounter: Secondary | ICD-10-CM | POA: Insufficient documentation

## 2018-09-27 DIAGNOSIS — K219 Gastro-esophageal reflux disease without esophagitis: Secondary | ICD-10-CM | POA: Diagnosis not present

## 2018-09-27 DIAGNOSIS — I10 Essential (primary) hypertension: Secondary | ICD-10-CM | POA: Diagnosis not present

## 2018-09-27 DIAGNOSIS — Z85528 Personal history of other malignant neoplasm of kidney: Secondary | ICD-10-CM | POA: Diagnosis not present

## 2018-09-27 DIAGNOSIS — S76312A Strain of muscle, fascia and tendon of the posterior muscle group at thigh level, left thigh, initial encounter: Secondary | ICD-10-CM | POA: Diagnosis not present

## 2018-09-27 DIAGNOSIS — Z86010 Personal history of colonic polyps: Secondary | ICD-10-CM | POA: Insufficient documentation

## 2018-09-27 DIAGNOSIS — J449 Chronic obstructive pulmonary disease, unspecified: Secondary | ICD-10-CM | POA: Diagnosis not present

## 2018-09-27 DIAGNOSIS — Z87442 Personal history of urinary calculi: Secondary | ICD-10-CM | POA: Insufficient documentation

## 2018-09-27 HISTORY — PX: OPEN SURGICAL REPAIR OF GLUTEAL TENDON: SHX5995

## 2018-09-27 HISTORY — DX: Trochanteric bursitis, left hip: M70.62

## 2018-09-27 SURGERY — REPAIR, TENDON, GLUTEUS MEDIUS, OPEN
Anesthesia: General | Laterality: Left

## 2018-09-27 MED ORDER — ROCURONIUM BROMIDE 10 MG/ML (PF) SYRINGE
PREFILLED_SYRINGE | INTRAVENOUS | Status: AC
  Filled 2018-09-27: qty 10

## 2018-09-27 MED ORDER — MORPHINE SULFATE (PF) 2 MG/ML IV SOLN: 0.5000 mg | INTRAVENOUS | Status: DC | PRN

## 2018-09-27 MED ORDER — EPHEDRINE 5 MG/ML INJ
INTRAVENOUS | Status: AC
  Filled 2018-09-27: qty 10

## 2018-09-27 MED ORDER — METOCLOPRAMIDE HCL 5 MG/ML IJ SOLN: 5.0000 mg | Freq: Three times a day (TID) | INTRAMUSCULAR | Status: DC | PRN

## 2018-09-27 MED ORDER — DEXAMETHASONE SODIUM PHOSPHATE 10 MG/ML IJ SOLN
INTRAMUSCULAR | Status: DC | PRN
  Administered 2018-09-27: 8 mg via INTRAVENOUS

## 2018-09-27 MED ORDER — MEPERIDINE HCL 50 MG/ML IJ SOLN: 6.2500 mg | INTRAMUSCULAR | Status: DC | PRN

## 2018-09-27 MED ORDER — ONDANSETRON HCL 4 MG/2ML IJ SOLN: 4.0000 mg | Freq: Four times a day (QID) | INTRAMUSCULAR | Status: DC | PRN

## 2018-09-27 MED ORDER — MOMETASONE FURO-FORMOTEROL FUM 200-5 MCG/ACT IN AERO
2.0000 | INHALATION_SPRAY | Freq: Two times a day (BID) | RESPIRATORY_TRACT | Status: DC
  Filled 2018-09-27: qty 8.8

## 2018-09-27 MED ORDER — OLANZAPINE-FLUOXETINE HCL 3-25 MG PO CAPS
1.0000 | ORAL_CAPSULE | Freq: Every evening | ORAL | Status: DC
  Administered 2018-09-27: 1 via ORAL
  Filled 2018-09-27 (×2): qty 1

## 2018-09-27 MED ORDER — ALBUTEROL SULFATE HFA 108 (90 BASE) MCG/ACT IN AERS: 1.0000 | INHALATION_SPRAY | Freq: Four times a day (QID) | RESPIRATORY_TRACT | Status: DC | PRN

## 2018-09-27 MED ORDER — LIDOCAINE 2% (20 MG/ML) 5 ML SYRINGE
INTRAMUSCULAR | Status: AC
  Filled 2018-09-27: qty 5

## 2018-09-27 MED ORDER — 0.9 % SODIUM CHLORIDE (POUR BTL) OPTIME
TOPICAL | Status: DC | PRN
  Administered 2018-09-27: 1000 mL

## 2018-09-27 MED ORDER — POVIDONE-IODINE 10 % EX SWAB: 2.0000 "application " | Freq: Once | CUTANEOUS | Status: DC

## 2018-09-27 MED ORDER — DOCUSATE SODIUM 100 MG PO CAPS
100.0000 mg | ORAL_CAPSULE | Freq: Two times a day (BID) | ORAL | Status: DC
  Administered 2018-09-27 – 2018-09-28 (×2): 100 mg via ORAL
  Filled 2018-09-27 (×2): qty 1

## 2018-09-27 MED ORDER — FENTANYL CITRATE (PF) 250 MCG/5ML IJ SOLN
INTRAMUSCULAR | Status: DC | PRN
  Administered 2018-09-27 (×4): 50 ug via INTRAVENOUS

## 2018-09-27 MED ORDER — CHLORHEXIDINE GLUCONATE 4 % EX LIQD: 60.0000 mL | Freq: Once | CUTANEOUS | Status: DC

## 2018-09-27 MED ORDER — METOCLOPRAMIDE HCL 5 MG PO TABS: 5.0000 mg | ORAL_TABLET | Freq: Three times a day (TID) | ORAL | Status: DC | PRN

## 2018-09-27 MED ORDER — HYDROMORPHONE HCL 1 MG/ML IJ SOLN
INTRAMUSCULAR | Status: AC
  Filled 2018-09-27: qty 1

## 2018-09-27 MED ORDER — ALPRAZOLAM 0.5 MG PO TABS: 0.5000 mg | ORAL_TABLET | Freq: Two times a day (BID) | ORAL | Status: DC | PRN

## 2018-09-27 MED ORDER — ROCURONIUM BROMIDE 10 MG/ML (PF) SYRINGE
PREFILLED_SYRINGE | INTRAVENOUS | Status: DC | PRN
  Administered 2018-09-27: 10 mg via INTRAVENOUS
  Administered 2018-09-27: 40 mg via INTRAVENOUS

## 2018-09-27 MED ORDER — FENTANYL CITRATE (PF) 100 MCG/2ML IJ SOLN
INTRAMUSCULAR | Status: AC
  Filled 2018-09-27: qty 2

## 2018-09-27 MED ORDER — SUGAMMADEX SODIUM 200 MG/2ML IV SOLN
INTRAVENOUS | Status: DC | PRN
  Administered 2018-09-27: 200 mg via INTRAVENOUS

## 2018-09-27 MED ORDER — TRAMADOL HCL 50 MG PO TABS
50.0000 mg | ORAL_TABLET | Freq: Four times a day (QID) | ORAL | Status: DC | PRN
  Administered 2018-09-28: 50 mg via ORAL
  Filled 2018-09-27: qty 1

## 2018-09-27 MED ORDER — ACETAMINOPHEN 325 MG PO TABS
325.0000 mg | ORAL_TABLET | Freq: Four times a day (QID) | ORAL | Status: DC | PRN
  Administered 2018-09-28: 650 mg via ORAL
  Filled 2018-09-27: qty 2

## 2018-09-27 MED ORDER — BUPIVACAINE HCL (PF) 0.25 % IJ SOLN
INTRAMUSCULAR | Status: DC | PRN
  Administered 2018-09-27: 20 mL

## 2018-09-27 MED ORDER — METHOCARBAMOL 500 MG IVPB - SIMPLE MED
500.0000 mg | Freq: Four times a day (QID) | INTRAVENOUS | Status: DC | PRN
  Filled 2018-09-27: qty 50

## 2018-09-27 MED ORDER — ASPIRIN EC 325 MG PO TBEC
325.0000 mg | DELAYED_RELEASE_TABLET | Freq: Every day | ORAL | Status: DC
  Administered 2018-09-28: 325 mg via ORAL
  Filled 2018-09-27: qty 1

## 2018-09-27 MED ORDER — ONDANSETRON HCL 4 MG PO TABS: 4.0000 mg | ORAL_TABLET | Freq: Four times a day (QID) | ORAL | Status: DC | PRN

## 2018-09-27 MED ORDER — EPHEDRINE SULFATE-NACL 50-0.9 MG/10ML-% IV SOSY
PREFILLED_SYRINGE | INTRAVENOUS | Status: DC | PRN
  Administered 2018-09-27: 5 mg via INTRAVENOUS
  Administered 2018-09-27: 10 mg via INTRAVENOUS

## 2018-09-27 MED ORDER — HYDROMORPHONE HCL 1 MG/ML IJ SOLN
0.2500 mg | INTRAMUSCULAR | Status: DC | PRN
  Administered 2018-09-27 (×2): 0.5 mg via INTRAVENOUS

## 2018-09-27 MED ORDER — BUPIVACAINE HCL 0.25 % IJ SOLN
INTRAMUSCULAR | Status: AC
  Filled 2018-09-27: qty 1

## 2018-09-27 MED ORDER — SUGAMMADEX SODIUM 200 MG/2ML IV SOLN
INTRAVENOUS | Status: AC
  Filled 2018-09-27: qty 2

## 2018-09-27 MED ORDER — ACETAMINOPHEN 10 MG/ML IV SOLN
1000.0000 mg | Freq: Four times a day (QID) | INTRAVENOUS | Status: DC
  Administered 2018-09-27: 1000 mg via INTRAVENOUS
  Filled 2018-09-27: qty 100

## 2018-09-27 MED ORDER — CEFAZOLIN SODIUM-DEXTROSE 2-4 GM/100ML-% IV SOLN
2.0000 g | INTRAVENOUS | Status: AC
  Administered 2018-09-27: 2 g via INTRAVENOUS
  Filled 2018-09-27: qty 100

## 2018-09-27 MED ORDER — LACTATED RINGERS IV SOLN
INTRAVENOUS | Status: DC
  Administered 2018-09-27 (×2): via INTRAVENOUS

## 2018-09-27 MED ORDER — PROPOFOL 10 MG/ML IV BOLUS
INTRAVENOUS | Status: DC | PRN
  Administered 2018-09-27: 20 mg via INTRAVENOUS
  Administered 2018-09-27: 180 mg via INTRAVENOUS

## 2018-09-27 MED ORDER — LIDOCAINE 2% (20 MG/ML) 5 ML SYRINGE
INTRAMUSCULAR | Status: DC | PRN
  Administered 2018-09-27: 40 mg via INTRAVENOUS

## 2018-09-27 MED ORDER — PROPOFOL 10 MG/ML IV BOLUS
INTRAVENOUS | Status: AC
  Filled 2018-09-27: qty 20

## 2018-09-27 MED ORDER — ONDANSETRON HCL 4 MG/2ML IJ SOLN: 4.0000 mg | Freq: Once | INTRAMUSCULAR | Status: DC | PRN

## 2018-09-27 MED ORDER — ONDANSETRON HCL 4 MG/2ML IJ SOLN
INTRAMUSCULAR | Status: DC | PRN
  Administered 2018-09-27: 4 mg via INTRAVENOUS

## 2018-09-27 MED ORDER — MIDAZOLAM HCL 2 MG/2ML IJ SOLN
INTRAMUSCULAR | Status: AC
  Filled 2018-09-27: qty 2

## 2018-09-27 MED ORDER — MIDAZOLAM HCL 2 MG/2ML IJ SOLN
INTRAMUSCULAR | Status: DC | PRN
  Administered 2018-09-27: 2 mg via INTRAVENOUS

## 2018-09-27 MED ORDER — DEXAMETHASONE SODIUM PHOSPHATE 10 MG/ML IJ SOLN
INTRAMUSCULAR | Status: AC
  Filled 2018-09-27: qty 1

## 2018-09-27 MED ORDER — ONDANSETRON HCL 4 MG/2ML IJ SOLN
INTRAMUSCULAR | Status: AC
  Filled 2018-09-27: qty 2

## 2018-09-27 MED ORDER — POTASSIUM CHLORIDE CRYS ER 20 MEQ PO TBCR
20.0000 meq | EXTENDED_RELEASE_TABLET | Freq: Two times a day (BID) | ORAL | Status: DC
  Administered 2018-09-27 – 2018-09-28 (×2): 20 meq via ORAL
  Filled 2018-09-27 (×2): qty 1

## 2018-09-27 MED ORDER — METHOCARBAMOL 500 MG PO TABS
500.0000 mg | ORAL_TABLET | Freq: Four times a day (QID) | ORAL | Status: DC | PRN
  Administered 2018-09-28: 500 mg via ORAL
  Filled 2018-09-27: qty 1

## 2018-09-27 MED ORDER — SUCCINYLCHOLINE CHLORIDE 200 MG/10ML IV SOSY
PREFILLED_SYRINGE | INTRAVENOUS | Status: AC
  Filled 2018-09-27: qty 10

## 2018-09-27 MED ORDER — HYDROCHLOROTHIAZIDE 25 MG PO TABS
25.0000 mg | ORAL_TABLET | Freq: Every day | ORAL | Status: DC
  Administered 2018-09-28: 25 mg via ORAL
  Filled 2018-09-27: qty 1

## 2018-09-27 MED ORDER — SODIUM CHLORIDE 0.9 % IV SOLN
INTRAVENOUS | Status: DC
  Administered 2018-09-27: 21:00:00 via INTRAVENOUS

## 2018-09-27 MED ORDER — ALBUTEROL SULFATE (2.5 MG/3ML) 0.083% IN NEBU: 2.5000 mg | INHALATION_SOLUTION | Freq: Four times a day (QID) | RESPIRATORY_TRACT | Status: DC | PRN

## 2018-09-27 MED ORDER — HYDROCODONE-ACETAMINOPHEN 5-325 MG PO TABS: 1.0000 | ORAL_TABLET | ORAL | Status: DC | PRN

## 2018-09-27 MED ORDER — CEFAZOLIN SODIUM-DEXTROSE 1-4 GM/50ML-% IV SOLN
1.0000 g | Freq: Once | INTRAVENOUS | Status: AC
  Administered 2018-09-27: 1 g via INTRAVENOUS
  Filled 2018-09-27: qty 50

## 2018-09-27 SURGICAL SUPPLY — 52 items
ANCH SUT 2 CP-2 EBND QANCHR+ (Anchor) ×1 IMPLANT
ANCHOR SUPER QUICK (Anchor) ×1 IMPLANT
BAG SPEC THK2 15X12 ZIP CLS (MISCELLANEOUS)
BAG ZIPLOCK 12X15 (MISCELLANEOUS) IMPLANT
BIT DRILL 2.8X128 (BIT) IMPLANT
BLADE EXTENDED COATED 6.5IN (ELECTRODE) IMPLANT
CLSR STERI-STRIP ANTIMIC 1/2X4 (GAUZE/BANDAGES/DRESSINGS) ×1 IMPLANT
COVER SURGICAL LIGHT HANDLE (MISCELLANEOUS) ×2 IMPLANT
COVER WAND RF STERILE (DRAPES) IMPLANT
DRAPE INCISE IOBAN 66X45 STRL (DRAPES) ×2 IMPLANT
DRAPE ORTHO SPLIT 77X108 STRL (DRAPES) ×4
DRAPE POUCH INSTRU U-SHP 10X18 (DRAPES) ×2 IMPLANT
DRAPE SURG ORHT 6 SPLT 77X108 (DRAPES) ×2 IMPLANT
DRAPE U-SHAPE 47X51 STRL (DRAPES) ×2 IMPLANT
DRSG ADAPTIC 3X8 NADH LF (GAUZE/BANDAGES/DRESSINGS) ×2 IMPLANT
DRSG MEPILEX BORDER 4X4 (GAUZE/BANDAGES/DRESSINGS) ×1 IMPLANT
DRSG MEPILEX BORDER 4X8 (GAUZE/BANDAGES/DRESSINGS) ×2 IMPLANT
DURAPREP 26ML APPLICATOR (WOUND CARE) ×2 IMPLANT
ELECT BLADE 6.5 .24CM SHAFT (ELECTRODE) ×1 IMPLANT
ELECT REM PT RETURN 15FT ADLT (MISCELLANEOUS) ×2 IMPLANT
EVACUATOR 1/8 PVC DRAIN (DRAIN) IMPLANT
FACESHIELD WRAPAROUND (MASK) ×6 IMPLANT
FACESHIELD WRAPAROUND OR TEAM (MASK) ×2 IMPLANT
GAUZE SPONGE 4X4 12PLY STRL (GAUZE/BANDAGES/DRESSINGS) ×2 IMPLANT
GLOVE BIO SURGEON STRL SZ7 (GLOVE) ×2 IMPLANT
GLOVE BIO SURGEON STRL SZ8 (GLOVE) ×2 IMPLANT
GLOVE BIOGEL PI IND STRL 7.0 (GLOVE) ×1 IMPLANT
GLOVE BIOGEL PI IND STRL 8 (GLOVE) ×2 IMPLANT
GLOVE BIOGEL PI INDICATOR 7.0 (GLOVE) ×1
GLOVE BIOGEL PI INDICATOR 8 (GLOVE) ×2
GOWN STRL REUS W/TWL LRG LVL3 (GOWN DISPOSABLE) ×4 IMPLANT
KIT BASIN OR (CUSTOM PROCEDURE TRAY) ×2 IMPLANT
KIT TURNOVER KIT A (KITS) ×1 IMPLANT
MANIFOLD NEPTUNE II (INSTRUMENTS) ×2 IMPLANT
NDL MA TROC 1/2 (NEEDLE) IMPLANT
NDL SAFETY ECLIPSE 18X1.5 (NEEDLE) ×2 IMPLANT
NEEDLE HYPO 18GX1.5 SHARP (NEEDLE) ×4
NEEDLE MA TROC 1/2 (NEEDLE) IMPLANT
PACK TOTAL JOINT (CUSTOM PROCEDURE TRAY) ×2 IMPLANT
PASSER SUT SWANSON 36MM LOOP (INSTRUMENTS) IMPLANT
PROTECTOR NERVE ULNAR (MISCELLANEOUS) ×2 IMPLANT
STAPLER VISISTAT 35W (STAPLE) IMPLANT
STRIP CLOSURE SKIN 1/2X4 (GAUZE/BANDAGES/DRESSINGS) ×4 IMPLANT
SUT ETHIBOND NAB CT1 #1 30IN (SUTURE) ×1 IMPLANT
SUT MNCRL AB 4-0 PS2 18 (SUTURE) ×2 IMPLANT
SUT STRATAFIX 0 PDS 27 VIOLET (SUTURE)
SUT VIC AB 1 CT1 36 (SUTURE) ×2 IMPLANT
SUT VIC AB 2-0 CT1 27 (SUTURE) ×4
SUT VIC AB 2-0 CT1 TAPERPNT 27 (SUTURE) ×2 IMPLANT
SUTURE STRATFX 0 PDS 27 VIOLET (SUTURE) IMPLANT
SYR 30ML LL (SYRINGE) ×3 IMPLANT
TOWEL OR 17X26 10 PK STRL BLUE (TOWEL DISPOSABLE) ×4 IMPLANT

## 2018-09-27 NOTE — Plan of Care (Signed)
progressing 

## 2018-09-27 NOTE — Op Note (Signed)
[name], [name] [facility] MEDICAL RECORD OE:[id number] ACCOUNT [id number] DATE OF BIRTH:30-Sep-1960 FACILITY: [facility] LOCATION: [facility] PHYSICIAN:Takyra Cantrall Zella Ball, MD  OPERATIVE REPORT  DATE OF PROCEDURE:  09/27/2018  PREOPERATIVE DIAGNOSIS:  Left hip bursitis and gluteal tendon tear.  POSTOPERATIVE DIAGNOSIS:  Left hip bursitis and gluteal tendon tear.  PROCEDURE:  Left hip bursectomy and gluteal tendon repair.  SURGEON:  Gaynelle Arabian, MD  ASSISTANT:  Theresa Duty, PA-C  ANESTHESIA:  General.  ESTIMATED BLOOD LOSS:  25 mL.  DRAINS:  Hemovac x1.  COMPLICATIONS:  None.  CONDITION:  Stable to recovery.  BRIEF CLINICAL NOTE:  The patient is a 58 year old female with long history of significant lateral hip pain.  She has failed nonoperative management including injection and therapy and had an MRI showing a gluteal tendon tear.  She presents now for  bursectomy and tendon repair.  PROCEDURE IN DETAIL:  After successful administration of general anesthetic, the patient was placed in right lateral decubitus position with the left side up and held with a hip positioner.  Left lower extremity was isolated from her perineum with  plastic drapes and prepped and draped in the usual sterile fashion.  A longitudinal incision was made on the left thigh overlying the tip of the greater trochanter, about a 6-inch incision.  A very thick layer of subcutaneous tissue was dissected through  down to the layer of the fascia lata.  The fascia lata was incised in line with the skin incision.  There was an element of a thickened bursa, which was excised at this time.  She does have evidence of a tear of the posterior and central fibers of the  gluteus medius tendon, and there was some retraction off of the tip of the greater trochanter.  Anteriorly, the tendon is intact, as is the muscle.  I placed a Mitek anchor into the tip of the greater trochanter, and the sutures were passed through the  free  edge of the tendon.  The tendon was advanced back onto the bone and then oversewn.  The split in the fibers superiorly was sewn with interrupted Ethibond suture.  This led to a very sturdy repair.  I palpated and did not feel any other defects in  the tendon.  The wound was then copiously irrigated with saline solution.  The fascia lata was closed with a #1 Vicryl suture.  Further irrigation was performed.  A Hemovac drain was placed in the deep subcu.  Subcu was closed in multiple layers with  interrupted #1 Vicryl and interrupted 2-0 Vicryl more superficially.  Subcuticular layer was closed with a running 4-0 Monocryl.  Incisions were cleaned and dried and Steri-Strips and a bulky sterile dressing were applied.  The drain was hooked to  suction, and she was awakened and transported to recovery in stable condition.  LN/NUANCE  D:09/27/2018 T:09/27/2018 JOB:[id number]

## 2018-09-27 NOTE — Anesthesia Preprocedure Evaluation (Signed)
Anesthesia Evaluation  Patient identified by MRN, date of birth, ID band Patient awake    Reviewed: Allergy & Precautions, NPO status , Patient's Chart, lab work & pertinent test results  Airway Mallampati: I  TM Distance: >3 FB Neck ROM: Full    Dental   Pulmonary    Pulmonary exam normal        Cardiovascular hypertension, Pt. on medications Normal cardiovascular exam     Neuro/Psych Anxiety Depression    GI/Hepatic GERD  Medicated and Controlled,  Endo/Other    Renal/GU      Musculoskeletal   Abdominal   Peds  Hematology   Anesthesia Other Findings   Reproductive/Obstetrics                             Anesthesia Physical Anesthesia Plan  ASA: II  Anesthesia Plan: General   Post-op Pain Management:    Induction: Intravenous  PONV Risk Score and Plan: 3 and Ondansetron, Midazolam and Treatment may vary due to age or medical condition  Airway Management Planned: Oral ETT  Additional Equipment:   Intra-op Plan:   Post-operative Plan: Extubation in OR  Informed Consent: I have reviewed the patients History and Physical, chart, labs and discussed the procedure including the risks, benefits and alternatives for the proposed anesthesia with the patient or authorized representative who has indicated his/her understanding and acceptance.       Plan Discussed with: CRNA and Surgeon  Anesthesia Plan Comments:         Anesthesia Quick Evaluation

## 2018-09-27 NOTE — Transfer of Care (Signed)
Immediate Anesthesia Transfer of Care Note  Patient: [name]  Procedure(s) Performed: Left hip bursectomy; gluteal tendon repair (Left )  Patient Location: PACU  Anesthesia Type:General  Level of Consciousness: awake, alert , oriented and patient cooperative  Airway & Oxygen Therapy: Patient Spontanous Breathing and Patient connected to nasal cannula oxygen  Post-op Assessment: Report given to RN, Post -op Vital signs reviewed and stable and Patient moving all extremities  Post vital signs: Reviewed and stable  Last Vitals:  Vitals Value Taken Time  BP 121/75 09/27/18 1654  Temp    Pulse 87 09/27/18 1655  Resp 18 09/27/18 1655  SpO2 100 % 09/27/18 1655  Vitals shown include unvalidated device data.  Last Pain:  Vitals:   09/27/18 1347  TempSrc: Oral  PainSc: 0-No pain      Patients Stated Pain Goal: 4 (87/56/43 [id number])  Complications: No apparent anesthesia complications

## 2018-09-27 NOTE — Brief Op Note (Signed)
09/27/2018  4:31 PM  PATIENT:  [name]  58 y.o. female  PRE-OPERATIVE DIAGNOSIS:  left hip bursitis; gluteal tendon tear  POST-OPERATIVE DIAGNOSIS:  left hip bursitis; gluteal tendon tear  PROCEDURE:  Procedure(s) with comments: Left hip bursectomy; gluteal tendon repair (Left) - 16min  SURGEON:  Surgeon(s) and Role:    Gaynelle Arabian, MD - Primary  PHYSICIAN ASSISTANT:   ASSISTANTS: Theresa Duty, PA-C   ANESTHESIA:   general  EBL:  50 mL   BLOOD ADMINISTERED:none  DRAINS: (Medium) Hemovact drain(s) in the left hip with  Suction Open   LOCAL MEDICATIONS USED:  MARCAINE     COUNTS:  YES  TOURNIQUET:  * No tourniquets in log *  DICTATION: .Other Dictation: Dictation Number [id number]  PLAN OF CARE: Admit for overnight observation  PATIENT DISPOSITION:  PACU - hemodynamically stable.

## 2018-09-27 NOTE — Anesthesia Procedure Notes (Signed)
Procedure Name: Intubation Date/Time: 09/27/2018 3:31 PM Performed by: Niel Hummer, CRNA Pre-anesthesia Checklist: Patient being monitored, Suction available, Emergency Drugs available and Patient identified Patient Re-evaluated:Patient Re-evaluated prior to induction Oxygen Delivery Method: Circle system utilized Preoxygenation: Pre-oxygenation with 100% oxygen Induction Type: IV induction Ventilation: Mask ventilation without difficulty and Oral airway inserted - appropriate to patient size Laryngoscope Size: Mac and 4 Grade View: Grade II Tube type: Oral Tube size: 7.0 mm Number of attempts: 1 Airway Equipment and Method: Stylet Placement Confirmation: ETT inserted through vocal cords under direct vision,  positive ETCO2 and breath sounds checked- equal and bilateral Secured at: 22 cm Tube secured with: Tape Dental Injury: Teeth and Oropharynx as per pre-operative assessment

## 2018-09-27 NOTE — H&P (Signed)
CC- [name] is a 58 y.o. female who presents with left hip pain  Hip Pain: Patient complains of left hip pain. Onset of the symptoms was several months ago. Inciting event: none. She has significant lateral hip pain which has not responded to exercise or injection. She had an MRI of the hip showing a gluteal tendon tear. She has failed non-operative management and presents for left hp bursectomy and gluteal tendon repair  Past Medical History:  Diagnosis Date  . Anxiety    psychiatrist: Dr. Robina [name]   . Arthritis   . Colitis   . Colon polyps   . COPD (chronic obstructive pulmonary disease) (HCC)    chronic bronchitis  . Depression    psychiatrist: Dr. Robina [name]  . Elevated liver enzymes    Dr. Rulon [name] following  . Fatty liver   . GERD (gastroesophageal reflux disease)   . Heart murmur   . History of chicken pox   . History of kidney stones   . Renal cell carcinoma    hx of left and right; Dr. Nena [name]    Past Surgical History:  Procedure Laterality Date  . CHOLECYSTECTOMY    . FETAL RADIO FREQUENCY ABLATION    . FRACTURE SURGERY Left    ankle  . JOINT REPLACEMENT Left 2009   knee  . NEPHRECTOMY     partial LT  . TONSILLECTOMY      Prior to Admission medications   Medication Sig Start Date End Date Taking? Authorizing Provider  ALPRAZolam Duanne Moron) 0.5 MG tablet Take 0.5 mg by mouth 2 (two) times daily as needed for anxiety.   Yes [provider]  Cholecalciferol (VITAMIN D-3) 5000 units TABS Take 5,000 Units by mouth daily.   Yes [provider]  Cyanocobalamin (VITAMIN B-12) 3000 MCG SUBL Place 3,000 mcg under the tongue daily.   Yes [provider]  fluticasone-salmeterol (ADVAIR HFA) 230-21 MCG/ACT inhaler Inhale 2 puffs into the lungs 2 (two) times daily.   Yes [provider]  hydrochlorothiazide (HYDRODIURIL) 25 MG tablet Take 1 tablet (25 mg total) by mouth daily. 06/13/18  Yes [name], Betty A, DO  Magnesium 250 MG TABS Take 250  mg by mouth daily.   Yes [provider]  OLANZapine-FLUoxetine (SYMBYAX) 3-25 MG capsule Take 1 capsule by mouth every evening.  08/19/15  Yes [provider]  potassium chloride SA (K-DUR,KLOR-CON) 20 MEQ tablet TAKE 1 TABLET TWICE A DAY Patient taking differently: Take 20 mEq by mouth 2 (two) times daily.  03/20/18  Yes [name], Betty A, DO  VENTOLIN HFA 108 (90 Base) MCG/ACT inhaler USE 1 TO 2 INHALATIONS EVERY 6 HOURS AS NEEDED FOR WHEEZING OR SHORTNESS OF BREATH Patient taking differently: Inhale 1-2 puffs into the lungs every 6 (six) hours as needed for shortness of breath.  05/31/18  Yes [name], Betty D, MD  vitamin C (ASCORBIC ACID) 500 MG tablet Take 500 mg by mouth daily.   Yes [provider]  albuterol (PROVENTIL) (2.5 MG/3ML) 0.083% nebulizer solution Take 3 mLs (2.5 mg total) by nebulization every 6 (six) hours as needed for wheezing or shortness of breath. 06/01/18   Deneise Lever, MD    Physical Examination: General appearance - alert, well appearing, and in no distress Mental status - alert, oriented to person, place, and time Chest - clear to auscultation, no wheezes, rales or rhonchi, symmetric air entry Heart - normal rate, regular rhythm, normal S1, S2, no murmurs, rubs, clicks or gallops Abdomen -  soft, nontender, nondistended, no masses or organomegaly Neurological - alert, oriented, normal speech, no focal findings or movement disorder noted  A left hip exam was performed. GENERAL: no acute distress SKIN: intact SWELLING: none WARMTH: no warmth TENDERNESS: maximal at greater trochanter ROM: normal STRENGTH: normal except 4/5 left hip abduction GAIT: antalgic  ASSESSMENT: Left hip intractable bursitis and gluteal tendon tear  Plan  Left hip bursectomy and gluteal tendon repair. Discussed procedure, risks and potential compliations with the patient who elects to proceed.  Betty Plover [name], MD    09/27/2018, 2:13 PM

## 2018-09-27 NOTE — Anesthesia Postprocedure Evaluation (Signed)
Anesthesia Post Note  Patient: [name]  Procedure(s) Performed: Left hip bursectomy; gluteal tendon repair (Left )     Patient location during evaluation: PACU Anesthesia Type: General Level of consciousness: awake and alert Pain management: pain level controlled Vital Signs Assessment: post-procedure vital signs reviewed and stable Respiratory status: spontaneous breathing, nonlabored ventilation, respiratory function stable and patient connected to nasal cannula oxygen Cardiovascular status: blood pressure returned to baseline and stable Postop Assessment: no apparent nausea or vomiting Anesthetic complications: no    Last Vitals:  Vitals:   09/27/18 1653 09/27/18 1700  BP: 121/75 110/75  Pulse: 85 82  Resp: 12 14  Temp: 36.9 C   SpO2: 100% 100%    Last Pain:  Vitals:   09/27/18 1655  TempSrc:   PainSc: 10-Worst pain ever                 Aadvika Konen DAVID

## 2018-09-28 ENCOUNTER — Encounter (HOSPITAL_COMMUNITY): Payer: Self-pay | Admitting: Orthopedic Surgery

## 2018-09-28 DIAGNOSIS — F419 Anxiety disorder, unspecified: Secondary | ICD-10-CM | POA: Diagnosis not present

## 2018-09-28 DIAGNOSIS — M7072 Other bursitis of hip, left hip: Secondary | ICD-10-CM | POA: Diagnosis not present

## 2018-09-28 DIAGNOSIS — Z86010 Personal history of colonic polyps: Secondary | ICD-10-CM | POA: Diagnosis not present

## 2018-09-28 DIAGNOSIS — S76012A Strain of muscle, fascia and tendon of left hip, initial encounter: Secondary | ICD-10-CM | POA: Diagnosis not present

## 2018-09-28 DIAGNOSIS — F329 Major depressive disorder, single episode, unspecified: Secondary | ICD-10-CM | POA: Diagnosis not present

## 2018-09-28 DIAGNOSIS — M7602 Gluteal tendinitis, left hip: Secondary | ICD-10-CM | POA: Diagnosis not present

## 2018-09-28 MED ORDER — METHOCARBAMOL 500 MG PO TABS: 500.0000 mg | ORAL_TABLET | Freq: Four times a day (QID) | ORAL | 0 refills | Status: DC | PRN

## 2018-09-28 MED ORDER — ASPIRIN 325 MG PO TBEC: 325.0000 mg | DELAYED_RELEASE_TABLET | Freq: Every day | ORAL | 0 refills | Status: AC

## 2018-09-28 MED ORDER — TRAMADOL HCL 50 MG PO TABS: 50.0000 mg | ORAL_TABLET | Freq: Four times a day (QID) | ORAL | 0 refills | Status: DC | PRN

## 2018-09-28 MED ORDER — HYDROCODONE-ACETAMINOPHEN 5-325 MG PO TABS: 1.0000 | ORAL_TABLET | Freq: Four times a day (QID) | ORAL | 0 refills | Status: DC | PRN

## 2018-09-28 NOTE — Progress Notes (Signed)
Patient has DME

## 2018-09-28 NOTE — Discharge Instructions (Signed)
Dr. Gaynelle Arabian Total Joint Specialist [facility] [address] [place], [place] [address] [phone]  Bursectomy / Gluteal Tendon Repair Discharge Instructions  HOME CARE INSTRUCTIONS  Remove items at home which could result in a fall. This includes throw rugs or furniture in walking pathways.   ICE to the affected hip every three hours for 30 minutes at a time and then as needed for pain and swelling.  Continue to use ice on the hip for pain and swelling from surgery. You may notice swelling that will progress down to the foot and ankle.  This is normal after surgery.  Elevate the leg when you are not up walking on it.    Continue to use the breathing machine which will help keep your temperature down.  It is common for your temperature to cycle up and down following surgery, especially at night when you are not up moving around and exerting yourself.  The breathing machine keeps your lungs expanded and your temperature down. Sit on high chairs which makes it easier to stand.  Sit on chairs with arms. Use the chair arms to help push yourself up when arising.   No Active Abduction of the leg (No pulling leg out to the side away from the body).  Use your walker for first several days until comfortable ambulating.  DIET You may resume your previous home diet once your are discharged from the hospital.  DRESSING / WOUND CARE / SHOWERING You may change your dressing 3-5 days after surgery.  Then change the dressing every day with sterile gauze.  Please use good hand washing techniques before changing the dressing.  Do not use any lotions or creams on the incision until instructed by your surgeon. You may start showering three days following surgery but do not submerge the incision under water.Just pat the incision dry and apply a dry gauze dressing on daily. Change the surgical dressing daily and reapply a dry dressing each time.  ACTIVITY Walk with your walker as  instructed. Use walker as long as suggested by your caregivers. Avoid periods of inactivity such as sitting longer than an hour when not asleep. This helps prevent blood clots.  You may resume a sexual relationship in one month or when given the OK by your doctor.  You may return to work once you are cleared by your doctor.  Do not drive a car for 6 weeks or until released by you surgeon.  Do not drive while taking narcotics.  WEIGHT BEARING Weight bearing as tolerated with assist device (walker, cane, etc) as directed, use it as long as suggested by your surgeon or therapist, typically at least 4-6 weeks.  POSTOPERATIVE CONSTIPATION PROTOCOL Constipation - defined medically as fewer than three stools per week and severe constipation as less than one stool per week.  One of the most common issues patients have following surgery is constipation.  Even if you have a regular bowel pattern at home, your normal regimen is likely to be disrupted due to multiple reasons following surgery.  Combination of anesthesia, postoperative narcotics, change in appetite and fluid intake all can affect your bowels.  In order to avoid complications following surgery, here are some recommendations in order to help you during your recovery period.  Colace (docusate) - Pick up an over-the-counter form of Colace or another stool softener and take twice a day as long as you are requiring postoperative pain medications.  Take with a full glass of water  daily.  If you experience loose stools or diarrhea, hold the colace until you stool forms back up.  If your symptoms do not get better within 1 week or if they get worse, check with your doctor.  Dulcolax (bisacodyl) - Pick up over-the-counter and take as directed by the product packaging as needed to assist with the movement of your bowels.  Take with a full glass of water.  Use this product as needed if not relieved by Colace only.   MiraLax (polyethylene glycol) - Pick  up over-the-counter to have on hand.  MiraLax is a solution that will increase the amount of water in your bowels to assist with bowel movements.  Take as directed and can mix with a glass of water, juice, soda, coffee, or tea.  Take if you go more than two days without a movement. Do not use MiraLax more than once per day. Call your doctor if you are still constipated or irregular after using this medication for 7 days in a row.  If you continue to have problems with postoperative constipation, please contact the office for further assistance and recommendations.  If you experience "the worst abdominal pain ever" or develop nausea or vomiting, please contact the office immediatly for further recommendations for treatment.  ITCHING  If you experience itching with your medications, try taking only a single pain pill, or even half a pain pill at a time.  You can also use Benadryl over the counter for itching or also to help with sleep.   TED HOSE STOCKINGS Wear the elastic stockings on both legs for three weeks following surgery during the day but you may remove then at night for sleeping.  MEDICATIONS See your medication summary on the After Visit Summary that the nursing staff will review with you prior to discharge.  You may have some home medications which will be placed on hold until you complete the course of blood thinner medication.  It is important for you to complete the blood thinner medication as prescribed by your surgeon.  Continue your approved medications as instructed at time of discharge.  PRECAUTIONS If you experience chest pain or shortness of breath - call 911 immediately for transfer to the hospital emergency department.  If you develop a fever greater that 101 F, purulent drainage from wound, increased redness or drainage from wound, foul odor from the wound/dressing, or calf pain - CONTACT YOUR SURGEON.                                                   FOLLOW-UP  APPOINTMENTS Make sure you keep all of your appointments after your operation with your surgeon and caregivers. You should call the office at the above phone number and make an appointment for approximately two weeks after the date of your surgery or on the date instructed by your surgeon outlined in the "After Visit Summary".   Pick up stool softner and laxative for home use following surgery while on pain medications. Do not submerge incision under water. Please use good hand washing techniques while changing dressing each day. May shower starting three days after surgery. Please use a clean towel to pat the incision dry following showers. Continue to use ice for pain and swelling after surgery. Do not use any lotions or creams on the incision until instructed by your  Psychologist, sport and exercise.

## 2018-09-28 NOTE — Evaluation (Addendum)
Physical Therapy Evaluation Patient Details Name: [name] MRN: [id number] DOB: 1960/05/28 Today's Date: 09/28/2018   History of Present Illness  s/p Left hip bursectomy; gluteal tendon repair (Left)  Clinical Impression  Patient evaluated by Physical Therapy with no further acute PT needs identified. All education has been completed and the patient has no further questions.  See below for any follow-up Physical Therapy or equipment needs. PT is signing off. Thank you for this referral.     Follow Up Recommendations No PT follow up    Equipment Recommendations  None recommended by PT    Recommendations for Other Services       Precautions / Restrictions Precautions Precaution Comments: no active abduction LLE Restrictions Weight Bearing Restrictions: No Other Position/Activity Restrictions: WBAT      Mobility  Bed Mobility               General bed mobility comments: pt OOB  Transfers Overall transfer level: Needs assistance Equipment used: Rolling walker (2 wheeled) Transfers: Sit to/from Stand Sit to Stand: Supervision         General transfer comment: cues for safety  Ambulation/Gait Ambulation/Gait assistance: Supervision Gait Distance (Feet): 180 Feet Assistive device: Rolling walker (2 wheeled) Gait Pattern/deviations: Step-to pattern     General Gait Details: cues for sequence  Stairs  up/down 3 steps with rail and cane, min guard, cues for sequence          Wheelchair Mobility    Modified Rankin (Stroke Patients Only)       Balance                                             Pertinent Vitals/Pain Pain Assessment: 0-10 Pain Score: 4  Pain Location: left hip Pain Descriptors / Indicators: Sore Pain Intervention(s): Monitored during session;Repositioned;Ice applied    Home Living Family/patient expects to be discharged to:: Private residence Living Arrangements: Spouse/significant  other Available Help at Discharge: Family Type of Home: House Home Access: Stairs to enter Entrance Stairs-Rails: Right Entrance Stairs-Number of Steps: 3 Home Layout: One level Home Equipment: Environmental consultant - 2 wheels;Cane - single point      Prior Function Level of Independence: Independent               Hand Dominance        Extremity/Trunk Assessment   Upper Extremity Assessment Upper Extremity Assessment: Overall WFL for tasks assessed    Lower Extremity Assessment Lower Extremity Assessment: LLE deficits/detail LLE Deficits / Details: ankle and knee grossly WFL, hip NT d/t precautions       Communication   Communication: No difficulties  Cognition Arousal/Alertness: Awake/alert Behavior During Therapy: WFL for tasks assessed/performed Overall Cognitive Status: Within Functional Limits for tasks assessed                                        General Comments      Exercises General Exercises - Lower Extremity Ankle Circles/Pumps: AROM;5 reps;Both Short Arc Quad: AROM;Left;10 reps Long Arc Quad: AROM;Left;10 reps Heel Slides: AAROM;Left;10 reps   Assessment/Plan    PT Assessment Patent does not need any further PT services  PT Problem List         PT Treatment Interventions      PT  Goals (Current goals can be found in the Care Plan section)  Acute Rehab PT Goals PT Goal Formulation: All assessment and education complete, DC therapy    Frequency     Barriers to discharge        Co-evaluation               AM-PAC PT "6 Clicks" Mobility  Outcome Measure Help needed turning from your back to your side while in a flat bed without using bedrails?: A Little Help needed moving from lying on your back to sitting on the side of a flat bed without using bedrails?: A Little Help needed moving to and from a bed to a chair (including a wheelchair)?: A Little Help needed standing up from a chair using your arms (e.g., wheelchair or  bedside chair)?: A Little Help needed to walk in hospital room?: A Little Help needed climbing 3-5 steps with a railing? : A Little 6 Click Score: 18    End of Session Equipment Utilized During Treatment: Gait belt Activity Tolerance: Patient tolerated treatment well Patient left: in chair;with call bell/phone within reach   PT Visit Diagnosis: Difficulty in walking, not elsewhere classified (R26.2)    Time: 1015-1040 PT Time Calculation (min) (ACUTE ONLY): 25 min   Charges:   PT Evaluation $PT Eval Low Complexity: 1 Low PT Treatments $Gait Training: 8-22 mins        Kenyon Ana, PT  Pager: [phone] Acute Rehab Dept [facility]): [phone]   09/28/2018   [facility] 09/28/2018, 11:03 AM

## 2018-09-28 NOTE — Progress Notes (Signed)
   Subjective: 1 Day Post-Op Procedure(s) (LRB): Left hip bursectomy; gluteal tendon repair (Left) Patient reports pain as mild.   Patient seen in rounds by Dr. Wynelle Link. Patient is well, and has had no acute complaints or problems. States she is ready to go home. Denies chest pain or SOB. No issues overnight. Voiding without difficulty.  Objective: Vital signs in last 24 hours: Temp:  [97.9 F (36.6 C)-100.2 F (37.9 C)] 98.1 F (36.7 C) (06/25 0449) Pulse Rate:  [75-85] 79 (06/25 0449) Resp:  [11-19] 16 (06/25 0449) BP: (109-140)/(58-84) 110/68 (06/25 0449) SpO2:  [93 %-100 %] 99 % (06/25 0449) Weight:  [91.1 kg] 91.1 kg (06/24 1347)  Intake/Output from previous day:  Intake/Output Summary (Last 24 hours) at 09/28/2018 0808 Last data filed at 09/28/2018 0600 Gross per 24 hour  Intake 2064.12 ml  Output 860 ml  Net 1204.12 ml    Exam: General - Patient is Alert and Oriented Extremity - Neurologically intact Neurovascular intact Sensation intact distally Dorsiflexion/Plantar flexion intact Dressing - dressing C/D/I Motor Function - intact, moving foot and toes well on exam.   Past Medical History:  Diagnosis Date  . Anxiety    psychiatrist: Dr. Robina Ade   . Arthritis   . Colitis   . Colon polyps   . COPD (chronic obstructive pulmonary disease) (HCC)    chronic bronchitis  . Depression    psychiatrist: Dr. Robina Ade  . Elevated liver enzymes    Dr. Rulon Abide following  . Fatty liver   . GERD (gastroesophageal reflux disease)   . Heart murmur   . History of chicken pox   . History of kidney stones   . Renal cell carcinoma    hx of left and right; Dr. Nena Alexander    Assessment/Plan: 1 Day Post-Op Procedure(s) (LRB): Left hip bursectomy; gluteal tendon repair (Left) Principal Problem:   Trochanteric bursitis of left hip Active Problems:   Trochanteric bursitis, left hip  Estimated body mass index is 36.73 kg/m as calculated from the following:   Height as of this  encounter: 5\' 2"  (1.575 m).   Weight as of this encounter: 91.1 kg. Advance diet Up with therapy D/C IV fluids  DVT Prophylaxis - Aspirin Weight bearing as tolerated. D/C O2 and pulse ox and try on room air. Hemovac pulled without difficulty.  Plan is to go Home after hospital stay. Plan for discharge to home after one session of physical therapy this AM.  No active abduction or leading with the left leg up stairs x 6 weeks. Precautions discussed with patient.  Follow-up in the office in 2 weeks.   Theresa Duty, PA-C Orthopedic Surgery 09/28/2018, 8:08 AM

## 2018-10-02 NOTE — Discharge Summary (Signed)
Physician Discharge Summary   Patient ID: [name] MRN: [id number] DOB/AGE: Jun 13, 1960 58 y.o.  Admit date: 09/27/2018 Discharge date: 09/28/2018  Primary Diagnosis: Left hip bursitis; gluteal tendon tear   Admission Diagnoses:  Past Medical History:  Diagnosis Date  . Anxiety    psychiatrist: Dr. Robina Ade   . Arthritis   . Colitis   . Colon polyps   . COPD (chronic obstructive pulmonary disease) (HCC)    chronic bronchitis  . Depression    psychiatrist: Dr. Robina Ade  . Elevated liver enzymes    Dr. Rulon Abide following  . Fatty liver   . GERD (gastroesophageal reflux disease)   . Heart murmur   . History of chicken pox   . History of kidney stones   . Renal cell carcinoma    hx of left and right; Dr. Nena Alexander   Discharge Diagnoses:   Principal Problem:   Trochanteric bursitis of left hip Active Problems:   Trochanteric bursitis, left hip  Estimated body mass index is 36.73 kg/m as calculated from the following:   Height as of this encounter: 5\' 2"  (1.575 m).   Weight as of this encounter: 91.1 kg.  Procedure:  Procedure(s) (LRB): Left hip bursectomy; gluteal tendon repair (Left)   Consults: None  HPI: The patient is a 58 year old female with long history of significant lateral hip pain.  She has failed nonoperative management including injection and therapy and had an MRI showing a gluteal tendon tear. She presents now for bursectomy and tendon repair.  Laboratory Data: Hospital Outpatient Visit on 09/23/2018  Component Date Value Ref Range Status  . SARS Coronavirus 2 09/23/2018 NEGATIVE  NEGATIVE Final   Comment: (NOTE) SARS-CoV-2 target nucleic acids are NOT DETECTED. The SARS-CoV-2 RNA is generally detectable in upper and lower respiratory specimens during the acute phase of infection. Negative results do not preclude SARS-CoV-2 infection, do not rule out co-infections with other pathogens, and should not be used as the sole basis for treatment or other  patient management decisions. Negative results must be combined with clinical observations, patient history, and epidemiological information. The expected result is Negative. Fact Sheet for Patients: TrashEliminator.se Fact Sheet for Healthcare Providers: WhoisBlogging.ch This test is not yet approved or cleared by the [place] FDA and  has been authorized for detection and/or diagnosis of SARS-CoV-2 by FDA under an Emergency Use Authorization (EUA). This EUA will remain  in effect (meaning this test can be used) for the duration of the COVID-19 declaration under Section 56                          4(b)(1) of the Act, 21 U.S.C. section 360bbb-3(b)(1), unless the authorization is terminated or revoked sooner. Performed at [facility], [place] [address], [place], [place] [address]   Hospital Outpatient Visit on 09/21/2018  Component Date Value Ref Range Status  . Sodium 09/21/2018 141  135 - 145 mmol/L Final  . Potassium 09/21/2018 3.6  3.5 - 5.1 mmol/L Final  . Chloride 09/21/2018 104  98 - 111 mmol/L Final  . CO2 09/21/2018 26  22 - 32 mmol/L Final  . Glucose, Bld 09/21/2018 101* 70 - 99 mg/dL Final  . BUN 09/21/2018 15  6 - 20 mg/dL Final  . Creatinine, Ser 09/21/2018 0.79  0.44 - 1.00 mg/dL Final  . Calcium 09/21/2018 10.1  8.9 - 10.3 mg/dL Final  . GFR calc non Af Amer 09/21/2018 >60  >60 mL/min Final  .  GFR calc Af Amer 09/21/2018 >60  >60 mL/min Final  . Anion gap 09/21/2018 11  5 - 15 Final   Performed at [facility], [place] [address], [place], [place] [address]  . WBC 09/21/2018 5.1  4.0 - 10.5 K/uL Final  . RBC 09/21/2018 4.71  3.87 - 5.11 MIL/uL Final  . Hemoglobin 09/21/2018 13.5  12.0 - 15.0 g/dL Final  . HCT 09/21/2018 43.3  36.0 - 46.0 % Final  . MCV 09/21/2018 91.9  80.0 - 100.0 fL Final  . MCH 09/21/2018 28.7  26.0 - 34.0 pg Final  . MCHC 09/21/2018 31.2  30.0 - 36.0 g/dL Final  . RDW  09/21/2018 15.5  11.5 - 15.5 % Final  . Platelets 09/21/2018 321  150 - 400 K/uL Final  . nRBC 09/21/2018 0.0  0.0 - 0.2 % Final   Performed at [facility], [place] [address] [place], [place] [address]     X-Rays:No results found.  EKG: Orders placed or performed during the hospital encounter of 09/21/18  . EKG 12 lead  . EKG 12 lead     Hospital Course: Zhanae Proffit is a 58 y.o. who was admitted to [facility]. They were brought to the operating room on 09/27/2018 and underwent Procedure(s): Left hip bursectomy; gluteal tendon repair.  Patient tolerated the procedure well and was later transferred to the recovery room and then to the orthopaedic floor for postoperative care. They were given PO and IV analgesics for pain control following their surgery. They were given 24 hours of postoperative antibiotics of  Anti-infectives (From admission, onward)   Start     Dose/Rate Route Frequency Ordered Stop   09/27/18 2130  ceFAZolin (ANCEF) IVPB 1 g/50 mL premix     1 g 100 mL/hr over 30 Minutes Intravenous  Once 09/27/18 1801 09/27/18 2105   09/27/18 1315  ceFAZolin (ANCEF) IVPB 2g/100 mL premix     2 g 200 mL/hr over 30 Minutes Intravenous On call to O.R. 09/27/18 1310 09/27/18 1555     and started on DVT prophylaxis in the form of Aspirin.   Physical therapy was ordered. Patient had a good night on the evening of surgery. They started to get up OOB with therapy on POD #1. Pt was seen during rounds and was ready to go home pending progress with therapy. Hemovac drain was pulled without difficulty. She worked with therapy on POD #1 for one session and was meeting her goals. Pt was discharged to home later that day in stable condition.  Diet: Regular diet Activity: WBAT No active abduction or leading with the left leg up stairs x 6 weeks Follow-up: in 2 weeks Disposition: Home Discharged Condition: stable   Discharge Instructions    Call MD / Call 911    Complete by: As directed    If you experience chest pain or shortness of breath, CALL 911 and be transported to the hospital emergency room.  If you develope a fever above 101 F, pus (white drainage) or increased drainage or redness at the wound, or calf pain, call your surgeon's office.   Change dressing   Complete by: As directed    You may change your dressing on Friday, then change the dressing daily with sterile 4 x 4 inch gauze dressing and paper tape.   Constipation Prevention   Complete by: As directed    Drink plenty of fluids.  Prune juice may be helpful.  You may use  a stool softener, such as Colace (over the counter) 100 mg twice a day.  Use MiraLax (over the counter) for constipation as needed.   Diet - low sodium heart healthy   Complete by: As directed    Weight bearing as tolerated   Complete by: As directed      Allergies as of 09/28/2018   No Known Allergies     Medication List    TAKE these medications   ALPRAZolam 0.5 MG tablet Commonly known as: XANAX Take 0.5 mg by mouth 2 (two) times daily as needed for anxiety.   aspirin 325 MG EC tablet Take 1 tablet (325 mg total) by mouth daily for 20 days. Then take one 81 mg aspirin once a day for three weeks. Then discontinue aspirin.   fluticasone-salmeterol 230-21 MCG/ACT inhaler Commonly known as: ADVAIR HFA Inhale 2 puffs into the lungs 2 (two) times daily.   hydrochlorothiazide 25 MG tablet Commonly known as: HYDRODIURIL Take 1 tablet (25 mg total) by mouth daily.   HYDROcodone-acetaminophen 5-325 MG tablet Commonly known as: NORCO/VICODIN Take 1-2 tablets by mouth every 6 (six) hours as needed for severe pain.   Magnesium 250 MG Tabs Take 250 mg by mouth daily.   methocarbamol 500 MG tablet Commonly known as: ROBAXIN Take 1 tablet (500 mg total) by mouth every 6 (six) hours as needed for muscle spasms.   OLANZapine-FLUoxetine 3-25 MG capsule Commonly known as: SYMBYAX Take 1 capsule by mouth every  evening.   potassium chloride SA 20 MEQ tablet Commonly known as: K-DUR TAKE 1 TABLET TWICE A DAY   traMADol 50 MG tablet Commonly known as: ULTRAM Take 1-2 tablets (50-100 mg total) by mouth every 6 (six) hours as needed for moderate pain.   Ventolin HFA 108 (90 Base) MCG/ACT inhaler Generic drug: albuterol USE 1 TO 2 INHALATIONS EVERY 6 HOURS AS NEEDED FOR WHEEZING OR SHORTNESS OF BREATH What changed: See the new instructions.   albuterol (2.5 MG/3ML) 0.083% nebulizer solution Commonly known as: PROVENTIL Take 3 mLs (2.5 mg total) by nebulization every 6 (six) hours as needed for wheezing or shortness of breath. What changed: Another medication with the same name was changed. Make sure you understand how and when to take each.   Vitamin B-12 3000 MCG Subl Place 3,000 mcg under the tongue daily.   vitamin C 500 MG tablet Commonly known as: ASCORBIC ACID Take 500 mg by mouth daily.   Vitamin D-3 125 MCG (5000 UT) Tabs Take 5,000 Units by mouth daily.            Discharge Care Instructions  (From admission, onward)         Start     Ordered   09/28/18 0000  Weight bearing as tolerated     09/28/18 0812   09/28/18 0000  Change dressing    Comments: You may change your dressing on Friday, then change the dressing daily with sterile 4 x 4 inch gauze dressing and paper tape.   09/28/18 8119         Follow-up Information    Gaynelle Arabian, MD. Schedule an appointment as soon as possible for a visit on 10/12/2018.   Specialty: Orthopedic Surgery Contact information: [address] [place] [address] [phone]           Signed: Theresa Duty, PA-C Orthopedic Surgery 10/02/2018, 7:52 AM

## 2018-11-22 DIAGNOSIS — M6281 Muscle weakness (generalized): Secondary | ICD-10-CM | POA: Diagnosis not present

## 2018-11-24 DIAGNOSIS — M6281 Muscle weakness (generalized): Secondary | ICD-10-CM | POA: Diagnosis not present

## 2018-11-29 DIAGNOSIS — M6281 Muscle weakness (generalized): Secondary | ICD-10-CM | POA: Diagnosis not present

## 2018-12-01 DIAGNOSIS — M6281 Muscle weakness (generalized): Secondary | ICD-10-CM | POA: Diagnosis not present

## 2018-12-05 DIAGNOSIS — M6281 Muscle weakness (generalized): Secondary | ICD-10-CM | POA: Diagnosis not present

## 2018-12-08 DIAGNOSIS — M6281 Muscle weakness (generalized): Secondary | ICD-10-CM | POA: Diagnosis not present

## 2018-12-10 ENCOUNTER — Other Ambulatory Visit: Payer: Self-pay | Admitting: Family Medicine

## 2018-12-10 DIAGNOSIS — I10 Essential (primary) hypertension: Secondary | ICD-10-CM

## 2018-12-13 DIAGNOSIS — M6281 Muscle weakness (generalized): Secondary | ICD-10-CM | POA: Diagnosis not present

## 2018-12-15 ENCOUNTER — Encounter: Payer: Self-pay | Admitting: Family Medicine

## 2018-12-15 ENCOUNTER — Other Ambulatory Visit: Payer: Self-pay

## 2018-12-15 ENCOUNTER — Ambulatory Visit (INDEPENDENT_AMBULATORY_CARE_PROVIDER_SITE_OTHER): Payer: Medicare Other | Admitting: Family Medicine

## 2018-12-15 VITALS — BP 127/80 | HR 67 | Temp 98.2°F | Resp 18 | Ht 62.0 in | Wt 210.4 lb

## 2018-12-15 DIAGNOSIS — F419 Anxiety disorder, unspecified: Secondary | ICD-10-CM

## 2018-12-15 DIAGNOSIS — F329 Major depressive disorder, single episode, unspecified: Secondary | ICD-10-CM

## 2018-12-15 DIAGNOSIS — I10 Essential (primary) hypertension: Secondary | ICD-10-CM

## 2018-12-15 DIAGNOSIS — E785 Hyperlipidemia, unspecified: Secondary | ICD-10-CM

## 2018-12-15 DIAGNOSIS — M6281 Muscle weakness (generalized): Secondary | ICD-10-CM | POA: Diagnosis not present

## 2018-12-15 DIAGNOSIS — Z23 Encounter for immunization: Secondary | ICD-10-CM | POA: Diagnosis not present

## 2018-12-15 MED ORDER — HYDROCHLOROTHIAZIDE 25 MG PO TABS: 25.0000 mg | ORAL_TABLET | Freq: Every day | ORAL | 1 refills | Status: DC

## 2018-12-15 NOTE — Progress Notes (Signed)
Patient ID: [name], female  DOB: [birth year], 58 y.o.   MRN: [id number] Patient Care Team    Relationship Specialty Notifications Start End  Ma Hillock, DO PCP - General Family Medicine  03/05/15   Gwendel Hanson, MD Referring Physician Urology  04/08/17   Aviva Signs, MD Referring Physician Gastroenterology  04/08/17   Deneise Lever, MD Consulting Physician Pulmonary Disease  04/08/17     Chief Complaint  Patient presents with  . Hypertension    Pt is doing well with no complaints.   . Depression  . Anxiety    Subjective:  [name] is a 58 y.o.  Female  present for [facility] follow up Hypertension/hyperlipidemia/BMI 30/elevated a1c: Pt reports compliance with HCTZ 25 mg QD. Blood pressures ranges at home WNL. Patient denies chest pain, shortness of breath, dizziness or lower extremity edema.  Pt does not take a  daily baby ASA. Pt is not prescribed statin. BMP: 09/2018, chronically elevated liver enzymes CBC: 6/22020 within normal limits Lipid: 06/2018- elevated ldl- refuses statin.  TSH: 06/2018 A1c: 5.7- 6 months ago. Diet: Cutting out soda, sugar Exercise: routinely.  RF: HTN, HLD, obesity, FHx HD Lost 4 lbs. Since last visit.   H/O RCC:  Pt has a h/o RCC left kidney with partial nephrectomy and RCC right kidney with cryoablation.  Since her last visit she has undergone some more changes with her renal cell carcinoma. She is following with specialties routinely.     Depression screen [facility] 2/9 12/15/2018 06/13/2018 04/08/2017 03/24/2016  Decreased Interest 0 1 0 0  Down, Depressed, Hopeless 0 1 0 0  PHQ - 2 Score 0 2 0 0  Altered sleeping 0 3 - -  Tired, decreased energy 0 2 - -  Change in appetite 0 2 - -  Feeling bad or failure about yourself  0 0 - -  Trouble concentrating 0 2 - -  Moving slowly or fidgety/restless 0 0 - -  Suicidal thoughts 0 0 - -  PHQ-9 Score 0 11 - -  Difficult doing work/chores Not difficult at all Not  difficult at all - -   GAD 7 : Generalized Anxiety Score 12/15/2018 06/13/2018  Nervous, Anxious, on Edge 0 2  Control/stop worrying 0 1  Worry too much - different things 0 1  Trouble relaxing 0 0  Restless 0 0  Easily annoyed or irritable 0 2  Afraid - awful might happen 0 1  Total GAD 7 Score 0 7  Anxiety Difficulty Not difficult at all Not difficult at all    Immunization History  Administered Date(s) Administered  . Influenza Split 01/24/2012, 01/06/2013, 01/03/2014, 01/15/2015  . Influenza,inj,Quad PF,6+ Mos 12/16/2014, 01/06/2016, 11/25/2016, 12/15/2017, 12/15/2018  . Pneumococcal Conjugate-13 03/24/2015  . Pneumococcal Polysaccharide-23 12/17/2013  . Tdap 03/24/2015     Past Medical History:  Diagnosis Date  . Anxiety    psychiatrist: Dr. Robina Ade   . Arthritis   . Colitis   . Colon polyps   . COPD (chronic obstructive pulmonary disease) (HCC)    chronic bronchitis  . Depression    psychiatrist: Dr. Robina Ade  . Elevated liver enzymes    Dr. Rulon Abide following  . Fatty liver   . GERD (gastroesophageal reflux disease)   . Heart murmur   . History of chicken pox   . History of kidney stones   . Renal cell carcinoma    hx of left and right; Dr. Nena Alexander  No Known Allergies Past Surgical History:  Procedure Laterality Date  . CHOLECYSTECTOMY    . FETAL RADIO FREQUENCY ABLATION    . FRACTURE SURGERY Left    ankle  . JOINT REPLACEMENT Left 2009   knee  . NEPHRECTOMY     partial LT  . OPEN SURGICAL REPAIR OF GLUTEAL TENDON Left 09/27/2018   Procedure: Left hip bursectomy; gluteal tendon repair;  Surgeon: Gaynelle Arabian, MD;  Location: [facility];  Service: Orthopedics;  Laterality: Left;  51min  . TONSILLECTOMY     Family History  Problem Relation Age of Onset  . Hypertension Mother   . Alzheimer's disease Mother   . Arthritis Mother   . Hypertension Father   . Alzheimer's disease Father   . COPD Father   . Heart disease Father   . Arthritis Father   . 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   Lives with her husband, son and granddaughter.   Drinks caffeinated beverages.   Wears her seatbelt, wears a bicycle helmet, there is a smoke detector in home. There are no firearms in the home.   Patient feels safe in her relationships.    Allergies as of 12/15/2018   No Known Allergies     Medication List       Accurate as of December 15, 2018 10:15 AM. If you have any questions, ask your nurse or doctor.        ALPRAZolam 0.5 MG tablet Commonly known as: XANAX Take 0.5 mg by mouth 2 (two) times daily as needed for anxiety.   fluticasone-salmeterol 230-21 MCG/ACT inhaler Commonly known as: ADVAIR HFA Inhale 2 puffs into the lungs 2 (two) times daily.   hydrochlorothiazide 25 MG tablet Commonly known as: HYDRODIURIL TAKE 1 TABLET DAILY   HYDROcodone-acetaminophen 5-325 MG tablet Commonly known as: NORCO/VICODIN Take 1-2 tablets by mouth every 6 (six) hours as needed for severe pain.   Magnesium 250 MG Tabs Take 250 mg by mouth daily.   methocarbamol 500 MG tablet Commonly known as: ROBAXIN Take 1 tablet (500 mg total) by mouth every 6 (six) hours as needed for muscle spasms.   OLANZapine-FLUoxetine 3-25 MG capsule Commonly known as: SYMBYAX Take 1 capsule by mouth every evening.   potassium chloride SA 20 MEQ tablet Commonly known as: K-DUR TAKE 1 TABLET TWICE A DAY   traMADol 50 MG tablet Commonly known as: ULTRAM Take 1-2 tablets (50-100 mg total) by mouth every 6 (six) hours as needed for moderate pain.   Ventolin HFA 108 (90 Base) MCG/ACT inhaler Generic drug: albuterol USE 1 TO 2 INHALATIONS EVERY 6 HOURS AS NEEDED FOR WHEEZING OR SHORTNESS OF BREATH What changed: See the new instructions.   albuterol (2.5 MG/3ML) 0.083% nebulizer solution Commonly known as: PROVENTIL Take 3 mLs (2.5 mg total) by nebulization  every 6 (six) hours as needed for wheezing or shortness of breath. What changed: Another medication with the same name was changed. Make sure you understand how and when to take each.   Vitamin B-12 3000 MCG Subl Place 3,000 mcg under the tongue daily.   vitamin C 500 MG tablet Commonly known as: ASCORBIC ACID Take 500 mg by mouth daily.   Vitamin D-3 125 MCG (5000 UT) Tabs Take 5,000 Units by mouth daily.       All past medical history, surgical history, allergies, family history, immunizations  andmedications were updated in the EMR today and reviewed under the history and medication portions of their EMR.      ROS: 14 pt review of systems performed and negative (unless mentioned in an HPI)  Objective: BP 127/80 (BP Location: Right Arm, Patient Position: Sitting, Cuff Size: Normal)   Pulse 67   Temp 98.2 F (36.8 C) (Temporal)   Resp 18   Ht 5\' 2"  (1.575 m)   Wt 210 lb 6 oz (95.4 kg)   LMP 04/22/2013   SpO2 97%   BMI 38.48 kg/m  Gen: Afebrile. No acute distress.  HENT: AT. .  MMM.  Eyes:Pupils Equal Round Reactive to light, Extraocular movements intact,  Conjunctiva without redness, discharge or icterus. Neck/lymp/endocrine: Supple,no thyromegaly CV: RRR no murmur, no edema, +2/4 P posterior tibialis pulses Chest: CTAB, no wheeze or crackles Skin: no rashes, purpura or petechiae.  Neuro:  Normal gait. PERLA. EOMi. Alert. Oriented x3  Psych: Normal affect, dress and demeanor. Normal speech. Normal thought content and judgment.   No exam data present  Assessment/plan: [name] is a 58 y.o. female present for CPE. Anxiety and depression Follows w/ psych  Essential hypertension, benign/Hyperlipidemia, unspecified hyperlipidemia type/Morbid obesity ([employer]) - Stable. Refills provided today on HCTZ 25 mg - labs UTD. Collect next visit. - f/u 6 months  Flu shot administered today.   Return in about 6 months (around 06/14/2019).   Electronically signed  by: Howard Pouch, DO [place]

## 2018-12-15 NOTE — Patient Instructions (Signed)
It was great to see you today.  I have refilled your med.

## 2018-12-19 DIAGNOSIS — M6281 Muscle weakness (generalized): Secondary | ICD-10-CM | POA: Diagnosis not present

## 2018-12-21 DIAGNOSIS — M25561 Pain in right knee: Secondary | ICD-10-CM | POA: Diagnosis not present

## 2018-12-21 DIAGNOSIS — M1711 Unilateral primary osteoarthritis, right knee: Secondary | ICD-10-CM | POA: Diagnosis not present

## 2018-12-21 DIAGNOSIS — M7602 Gluteal tendinitis, left hip: Secondary | ICD-10-CM | POA: Diagnosis not present

## 2019-01-24 ENCOUNTER — Other Ambulatory Visit: Payer: Self-pay | Admitting: Internal Medicine

## 2019-01-29 ENCOUNTER — Ambulatory Visit: Payer: Medicare Other

## 2019-02-02 ENCOUNTER — Other Ambulatory Visit: Payer: Self-pay

## 2019-02-02 ENCOUNTER — Ambulatory Visit
Admission: RE | Admit: 2019-02-02 | Discharge: 2019-02-02 | Disposition: A | Discharge status: Home / Self Care | Payer: Medicare Other | Source: Ambulatory Visit | Attending: Family Medicine | Admitting: Family Medicine

## 2019-02-02 DIAGNOSIS — Z1231 Encounter for screening mammogram for malignant neoplasm of breast: Secondary | ICD-10-CM | POA: Diagnosis not present

## 2019-02-05 ENCOUNTER — Other Ambulatory Visit: Payer: Self-pay

## 2019-02-05 ENCOUNTER — Ambulatory Visit (INDEPENDENT_AMBULATORY_CARE_PROVIDER_SITE_OTHER): Payer: Medicare Other | Admitting: Internal Medicine

## 2019-02-05 ENCOUNTER — Encounter: Payer: Self-pay | Admitting: Internal Medicine

## 2019-02-05 DIAGNOSIS — J449 Chronic obstructive pulmonary disease, unspecified: Secondary | ICD-10-CM | POA: Diagnosis not present

## 2019-02-05 DIAGNOSIS — R918 Other nonspecific abnormal finding of lung field: Secondary | ICD-10-CM

## 2019-02-05 NOTE — Assessment & Plan Note (Signed)
Very stable, low risk based on CT chest 2017 and CXR 01/2018 Appropriate to consider CXR every few years

## 2019-02-05 NOTE — Assessment & Plan Note (Signed)
Very clear and well controlled now with no recent exacerbation. Plan- continue Advair 100

## 2019-02-05 NOTE — Progress Notes (Signed)
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 MAIC, GERD, complicated by history renal cell CA PFT- 2008-  CT chest [facility] 12/13/13- Ground glass, tree-in-bud, and micronodular opacities in a bronchovascular pattern involving the right upper lobe are favored to be related to an infectious process. Recommend repeat chest CT after appropriate therapy to document resolution. CT chest 03/15/16 No evidence of interstitial lung disease or mycobacterium aviumcomplex. Scattered pulmonary nodules. 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 MAIC, GERD, complicated by history renal cell CA -----Lung Nodules:  Had right renal cryoablation yesterday at [facility] 03/13/2017-[facility]- report only-normal cardiomediastinal silhouette and clear lungs without focal opacity Has had partial left nephrectomy, remote RFA right kidney lesion and now cryoablation. CT of abdomen and pelvis at [facility] had indicated 2 mm left lung base nodule.  She has not had dedicated chest imaging in a year. Breathing very well using Symbicort twice daily.  Her insurance favors Advair now and she agrees to change.  Little need for rescue inhaler.  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 MAIC, GERD, complicated by history renal cell CA Hosp in June for L hip bursitis repair Had ablation recurrent renal Ca- followed at [facility]. Doing well. -----1 year follow up for Asthma Asthma control score 25 Breathing has been very good. Insurance changed Symbicort to Advair 100 which works fine.  Covid careful. Discussed potential vaccine. UTD flu and Pvax. Hasn't needed rescue inhaler all summer. Lung nodules stable- discussed  imaging.  CXR 02/02/18-  No active cardiopulmonary disease.  ROS-see HPI   + = positive Constitutional:   No-   weight loss, night sweats, fevers, chills, fatigue, lassitude. HEENT:   +headaches, difficulty swallowing, tooth/dental problems, sore throat,       No-  sneezing, itching,  ear ache, nasal congestion, post nasal drip,  CV:  No-  chest pain, orthopnea, PND, swelling in lower extremities, anasarca, dizziness, palpitations Resp: +shortness of breath with exertion or at rest.             No-productive cough,   non-productive cough,  No- coughing up of blood.              -change in color of mucus. No- wheezing.   Skin: No-   rash or lesions. GI:  + heartburn, indigestion, No-abdominal pain, nausea, vomiting,  GU: . MS:  No-   joint pain or swelling.   Neuro-     nothing unusual Psych:  No- change in mood or affect. No depression or anxiety.  No memory loss.  OBJ- Physical Exam General- Alert, Oriented, Affect-appropriate, Distress- none acute. + Overweight. Skin- rash-none, lesions- none, excoriation- none Lymphadenopathy- none Head- atraumatic            Eyes- Gross vision intact, PERRLA, conjunctivae and secretions clear            Ears- clear            Nose- no- turbinate edema, no-Septal dev, mucus, polyps, erosion, perforation, sniffing            Throat- Mallampati II , mucosa-red , drainage+, tonsils- atrophic,   Neck- flexible , trachea midline, no stridor , thyroid nl, carotid no bruit Chest - symmetrical excursion , unlabored           Heart/CV- RRR , no  murmur , no gallop  , no rub, nl s1 s2                           - JVD- none , edema- none, stasis changes- none, varices- none           Lung- clear unlabored, wheeze- none, cough- none, dullness-none, rub- none           Chest wall-  Abd-  Br/ Gen/ Rectal- Not done, not indicated Extrem- cyanosis- none, clubbing, none, atrophy- none, strength- nl Neuro- grossly intact to observation

## 2019-02-05 NOTE — Patient Instructions (Signed)
Glad you are doing so well. Please call if we can help 

## 2019-03-07 DIAGNOSIS — K760 Fatty (change of) liver, not elsewhere classified: Secondary | ICD-10-CM | POA: Diagnosis not present

## 2019-03-07 DIAGNOSIS — C642 Malignant neoplasm of left kidney, except renal pelvis: Secondary | ICD-10-CM | POA: Diagnosis not present

## 2019-03-07 DIAGNOSIS — C641 Malignant neoplasm of right kidney, except renal pelvis: Secondary | ICD-10-CM | POA: Diagnosis not present

## 2019-03-14 ENCOUNTER — Ambulatory Visit: Payer: Medicare Other

## 2019-05-22 DIAGNOSIS — G43B0 Ophthalmoplegic migraine, not intractable: Secondary | ICD-10-CM | POA: Diagnosis not present

## 2019-05-22 DIAGNOSIS — H524 Presbyopia: Secondary | ICD-10-CM | POA: Diagnosis not present

## 2019-05-22 DIAGNOSIS — H2513 Age-related nuclear cataract, bilateral: Secondary | ICD-10-CM | POA: Diagnosis not present

## 2019-05-28 ENCOUNTER — Telehealth: Payer: Self-pay | Admitting: Internal Medicine

## 2019-05-28 MED ORDER — FLUTICASONE-SALMETEROL 100-50 MCG/DOSE IN AEPB: 2.0000 | INHALATION_SPRAY | Freq: Two times a day (BID) | RESPIRATORY_TRACT | 5 refills | Status: DC

## 2019-05-28 NOTE — Telephone Encounter (Signed)
Spoke with pt and advised that Advair refill was sent to [facility]. PT verbalized understanding. Nothing further needed.

## 2019-05-30 ENCOUNTER — Telehealth: Payer: Self-pay | Admitting: Internal Medicine

## 2019-05-30 NOTE — Telephone Encounter (Signed)
Correct Advair dosing is 1 puff, then rinse mouth, twice daily

## 2019-05-30 NOTE — Telephone Encounter (Signed)
Called and spoke with Patient.  Patient stated new Advair prescription was for 2 puffs, twice a day.  Patient stated she takes Advair 1 puff, twice a day.  Patient stated [facility] needs clarification. Per LOV, Patient is taking Advair two puffs twice a day.   Message routed to Dr. Annamaria Boots to ok Advair 1 puff twice a day prescription.  No Known Allergies Current Outpatient Medications on File Prior to Visit  Medication Sig Dispense Refill  . albuterol (PROVENTIL) (2.5 MG/3ML) 0.083% nebulizer solution Take 3 mLs (2.5 mg total) by nebulization every 6 (six) hours as needed for wheezing or shortness of breath. 75 mL 12  . ALPRAZolam (XANAX) 0.5 MG tablet Take 0.5 mg by mouth 2 (two) times daily as needed for anxiety.    . Cholecalciferol (VITAMIN D-3) 5000 units TABS Take 5,000 Units by mouth daily.    . Cyanocobalamin (VITAMIN B-12) 3000 MCG SUBL Place 3,000 mcg under the tongue daily.    . Fluticasone-Salmeterol (ADVAIR DISKUS) 100-50 MCG/DOSE AEPB Inhale 2 puffs into the lungs 2 (two) times daily. 60 each 5  . hydrochlorothiazide (HYDRODIURIL) 25 MG tablet Take 1 tablet (25 mg total) by mouth daily. 90 tablet 1  . HYDROcodone-acetaminophen (NORCO/VICODIN) 5-325 MG tablet Take 1-2 tablets by mouth every 6 (six) hours as needed for severe pain. 56 tablet 0  . Magnesium 250 MG TABS Take 250 mg by mouth daily.    . methocarbamol (ROBAXIN) 500 MG tablet Take 1 tablet (500 mg total) by mouth every 6 (six) hours as needed for muscle spasms. 40 tablet 0  . OLANZapine-FLUoxetine (SYMBYAX) 3-25 MG capsule Take 1 capsule by mouth every evening.     . potassium chloride SA (K-DUR,KLOR-CON) 20 MEQ tablet TAKE 1 TABLET TWICE A DAY (Patient taking differently: Take 20 mEq by mouth 2 (two) times daily. ) 180 tablet 4  . traMADol (ULTRAM) 50 MG tablet Take 1-2 tablets (50-100 mg total) by mouth every 6 (six) hours as needed for moderate pain. (Patient not taking: Reported on 12/15/2018) 40 tablet 0  .  VENTOLIN HFA 108 (90 Base) MCG/ACT inhaler USE 1 TO 2 INHALATIONS EVERY 6 HOURS AS NEEDED FOR WHEEZING OR SHORTNESS OF BREATH (Patient taking differently: Inhale 1-2 puffs into the lungs every 6 (six) hours as needed for shortness of breath. ) 18 g 14  . vitamin C (ASCORBIC ACID) 500 MG tablet Take 500 mg by mouth daily.     No current facility-administered medications on file prior to visit.

## 2019-05-31 MED ORDER — FLUTICASONE-SALMETEROL 100-50 MCG/DOSE IN AEPB: 1.0000 | INHALATION_SPRAY | Freq: Two times a day (BID) | RESPIRATORY_TRACT | 3 refills | Status: DC

## 2019-05-31 NOTE — Telephone Encounter (Signed)
[facility]ed [facility] and updated rx- 1 puff bid 90 days supply  Spoke with the pt and notified that this was done  She verbalized understanding and nothing further needed

## 2019-06-13 ENCOUNTER — Other Ambulatory Visit: Payer: Self-pay | Admitting: Family Medicine

## 2019-06-13 DIAGNOSIS — I10 Essential (primary) hypertension: Secondary | ICD-10-CM

## 2019-06-13 NOTE — Telephone Encounter (Signed)
MyChart message sent regarding med refill. Patient is currently due for next [facility]. Short supply can be sent to local pharmacy until appt.

## 2019-06-27 ENCOUNTER — Encounter: Payer: Self-pay | Admitting: Family Medicine

## 2019-06-27 ENCOUNTER — Other Ambulatory Visit: Payer: Self-pay | Admitting: Internal Medicine

## 2019-06-27 ENCOUNTER — Ambulatory Visit (INDEPENDENT_AMBULATORY_CARE_PROVIDER_SITE_OTHER): Payer: Medicare Other | Admitting: Family Medicine

## 2019-06-27 ENCOUNTER — Other Ambulatory Visit: Payer: Self-pay

## 2019-06-27 VITALS — BP 111/75 | HR 81 | Temp 97.6°F | Resp 16 | Ht 62.0 in | Wt 186.1 lb

## 2019-06-27 DIAGNOSIS — E876 Hypokalemia: Secondary | ICD-10-CM

## 2019-06-27 DIAGNOSIS — F329 Major depressive disorder, single episode, unspecified: Secondary | ICD-10-CM

## 2019-06-27 DIAGNOSIS — E559 Vitamin D deficiency, unspecified: Secondary | ICD-10-CM

## 2019-06-27 DIAGNOSIS — K760 Fatty (change of) liver, not elsewhere classified: Secondary | ICD-10-CM

## 2019-06-27 DIAGNOSIS — R7309 Other abnormal glucose: Secondary | ICD-10-CM | POA: Diagnosis not present

## 2019-06-27 DIAGNOSIS — I10 Essential (primary) hypertension: Secondary | ICD-10-CM

## 2019-06-27 DIAGNOSIS — E785 Hyperlipidemia, unspecified: Secondary | ICD-10-CM

## 2019-06-27 DIAGNOSIS — F419 Anxiety disorder, unspecified: Secondary | ICD-10-CM | POA: Diagnosis not present

## 2019-06-27 LAB — LIPID PANEL
Cholesterol: 209 mg/dL — ABNORMAL HIGH (ref 0–200)
HDL: 47.9 mg/dL (ref >39.00)
LDL Cholesterol: 129 mg/dL — ABNORMAL HIGH (ref 0–99)
NonHDL: 160.82
Total CHOL/HDL Ratio: 4
Triglycerides: 159 mg/dL — ABNORMAL HIGH (ref 0.0–149.0)
VLDL: 31.8 mg/dL (ref 0.0–40.0)

## 2019-06-27 LAB — HEMOGLOBIN A1C: Hgb A1c MFr Bld: 5.6 % (ref 4.6–6.5)

## 2019-06-27 LAB — VITAMIN D 25 HYDROXY (VIT D DEFICIENCY, FRACTURES): VITD: 52.35 ng/mL (ref 30.00–100.00)

## 2019-06-27 LAB — TSH: TSH: 1.38 u[IU]/mL (ref 0.35–4.50)

## 2019-06-27 MED ORDER — HYDROCHLOROTHIAZIDE 25 MG PO TABS: 25.0000 mg | ORAL_TABLET | Freq: Every day | ORAL | 1 refills | Status: DC

## 2019-06-27 MED ORDER — POTASSIUM CHLORIDE CRYS ER 20 MEQ PO TBCR: 20.0000 meq | EXTENDED_RELEASE_TABLET | Freq: Two times a day (BID) | ORAL | 4 refills | Status: DC

## 2019-06-27 NOTE — Progress Notes (Signed)
Patient ID: [name], female  DOB: 25-Dec-1960, 59 y.o.   MRN: [id number] Patient Care Team    Relationship Specialty Notifications Start End  Ma Hillock, DO PCP - General Family Medicine  03/05/15   Gwendel Hanson, MD Referring Physician Urology  04/08/17   Aviva Signs, MD Referring Physician Gastroenterology  04/08/17   Deneise Lever, MD Consulting Physician Pulmonary Disease  04/08/17     Chief Complaint  Patient presents with  . Hypertension    Pt is not fasting. Needs refills on potassium and HCTZ.   Marland Kitchen Anxiety  . Depression    Subjective: [name] is a 59 y.o.  Female  present for [facility] follow up Hypertension/hyperlipidemia/BMI 30/elevated a1c/hypok: Pt reports compliance with HCTZ 25 mg QD.Patient denies chest pain, shortness of breath, dizziness or lower extremity edema.  Pt does not take a  daily baby ASA. Pt is not prescribed statin. BMP: 03/2019, chronically elevated liver enzymes. K mildly low to low normal.  CBC: 12/22020 within normal limits Lipid: 06/2018- elevated ldl- refuses statin.  TSH: 06/2018 A1c: 5.7- 06/2018 Diet: Cutting out soda, sugar Exercise: routinely.  RF: HTN, HLD, obesity, FHx HD Lost 4 lbs. Since last visit.   H/O RCC:  Pt has a h/o RCC left kidney with partial nephrectomy and RCC right kidney with cryoablation.  Since her last visit she has undergone some more changes with her renal cell carcinoma. She is following with specialties routinely.     Depression screen [facility] 2/9 06/27/2019 12/15/2018 06/13/2018 04/08/2017 03/24/2016  Decreased Interest 0 0 1 0 0  Down, Depressed, Hopeless 0 0 1 0 0  PHQ - 2 Score 0 0 2 0 0  Altered sleeping 0 0 3 - -  Tired, decreased energy 0 0 2 - -  Change in appetite 0 0 2 - -  Feeling bad or failure about yourself  0 0 0 - -  Trouble concentrating 0 0 2 - -  Moving slowly or fidgety/restless 0 0 0 - -  Suicidal thoughts 0 0 0 - -  PHQ-9 Score 0 0 11 - -  Difficult doing  work/chores Not difficult at all Not difficult at all Not difficult at all - -   GAD 7 : Generalized Anxiety Score 06/27/2019 12/15/2018 06/13/2018  Nervous, Anxious, on Edge 0 0 2  Control/stop worrying 0 0 1  Worry too much - different things 0 0 1  Trouble relaxing 0 0 0  Restless 0 0 0  Easily annoyed or irritable 0 0 2  Afraid - awful might happen 0 0 1  Total GAD 7 Score 0 0 7  Anxiety Difficulty Not difficult at all Not difficult at all Not difficult at all    Immunization History  Administered Date(s) Administered  . Influenza Split 01/24/2012, 01/06/2013, 01/03/2014, 01/15/2015  . Influenza,inj,Quad PF,6+ Mos 12/16/2014, 01/06/2016, 11/25/2016, 12/15/2017, 12/15/2018  . PFIZER SARS-COV-2 Vaccination 06/21/2019  . Pneumococcal Conjugate-13 03/24/2015  . Pneumococcal Polysaccharide-23 12/17/2013  . Tdap 03/24/2015     Past Medical History:  Diagnosis Date  . Anxiety    psychiatrist: Dr. Robina Ade   . Arthritis   . Colitis   . Colon polyps   . COPD (chronic obstructive pulmonary disease) (HCC)    chronic bronchitis  . Depression    psychiatrist: Dr. Robina Ade  . Elevated liver enzymes    Dr. Rulon Abide following  . Fatty liver   . GERD (gastroesophageal reflux disease)   .  Heart murmur   . History of chicken pox   . History of kidney stones   . Renal cell carcinoma    hx of left and right; Dr. Nena Alexander   No Known Allergies Past Surgical History:  Procedure Laterality Date  . CHOLECYSTECTOMY    . FETAL RADIO FREQUENCY ABLATION    . FRACTURE SURGERY Left    ankle  . JOINT REPLACEMENT Left 2009   knee  . NEPHRECTOMY     partial LT  . OPEN SURGICAL REPAIR OF GLUTEAL TENDON Left 09/27/2018   Procedure: Left hip bursectomy; gluteal tendon repair;  Surgeon: Gaynelle Arabian, MD;  Location: [facility];  Service: Orthopedics;  Laterality: Left;  89min  . TONSILLECTOMY     Family History  Problem Relation Age of Onset  . Hypertension Mother   . Alzheimer's disease Mother   .  Arthritis Mother   . Hypertension Father   . Alzheimer's disease Father   . COPD Father   . Heart disease Father   . Arthritis Father   . 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   Lives with her husband, son and granddaughter.   Drinks caffeinated beverages.   Wears her seatbelt, wears a bicycle helmet, there is a smoke detector in home. There are no firearms in the home.   Patient feels safe in her relationships.    Allergies as of 06/27/2019   No Known Allergies     Medication List       Accurate as of June 27, 2019 10:46 AM. If you have any questions, ask your nurse or doctor.        STOP taking these medications   HYDROcodone-acetaminophen 5-325 MG tablet Commonly known as: NORCO/VICODIN Stopped by: Howard Pouch, DO   methocarbamol 500 MG tablet Commonly known as: ROBAXIN Stopped by: Howard Pouch, DO   traMADol 50 MG tablet Commonly known as: ULTRAM Stopped by: Howard Pouch, DO     TAKE these medications   ALPRAZolam 0.5 MG tablet Commonly known as: XANAX Take 0.5 mg by mouth 2 (two) times daily as needed for anxiety.   Fluticasone-Salmeterol 100-50 MCG/DOSE Aepb Commonly known as: Advair Diskus Inhale 1 puff into the lungs 2 (two) times daily.   hydrochlorothiazide 25 MG tablet Commonly known as: HYDRODIURIL Take 1 tablet (25 mg total) by mouth daily.   Magnesium 250 MG Tabs Take 250 mg by mouth daily.   OLANZapine-FLUoxetine 3-25 MG capsule Commonly known as: SYMBYAX Take 1 capsule by mouth every evening.   potassium chloride SA 20 MEQ tablet Commonly known as: KLOR-CON Take 1 tablet (20 mEq total) by mouth 2 (two) times daily.   Ventolin HFA 108 (90 Base) MCG/ACT inhaler Generic drug: albuterol USE 1 TO 2 INHALATIONS EVERY 6 HOURS AS NEEDED FOR WHEEZING OR SHORTNESS OF BREATH What changed: See the new instructions.   albuterol  (2.5 MG/3ML) 0.083% nebulizer solution Commonly known as: PROVENTIL Take 3 mLs (2.5 mg total) by nebulization every 6 (six) hours as needed for wheezing or shortness of breath. What changed: Another medication with the same name was changed. Make sure you understand how and when to take each.   Vitamin B-12 3000 MCG Subl Place 3,000 mcg under the tongue daily.   vitamin C 500 MG tablet Commonly known as: ASCORBIC ACID Take 500 mg by mouth daily.   Vitamin D-3 125 MCG (  5000 UT) Tabs Take 5,000 Units by mouth daily.       All past medical history, surgical history, allergies, family history, immunizations andmedications were updated in the EMR today and reviewed under the history and medication portions of their EMR.      ROS: 14 pt review of systems performed and negative (unless mentioned in an HPI)  Objective: BP 111/75 (BP Location: Right Arm, Patient Position: Sitting, Cuff Size: Normal)   Pulse 81   Temp 97.6 F (36.4 C) (Temporal)   Resp 16   Ht 5\' 2"  (1.575 m)   Wt 186 lb 2 oz (84.4 kg)   LMP 04/22/2013   SpO2 96%   BMI 34.04 kg/m  Gen: Afebrile. No acute distress.  HENT: AT. [name].  Eyes:Pupils Equal Round Reactive to light, Extraocular movements intact,  Conjunctiva without redness, discharge or icterus. Neck/lymp/endocrine: Supple,no lymphadenopathy, no thyromegaly CV: RRR no murmur, no edema Chest: CTAB, no wheeze or crackles Neuro:  Normal gait. PERLA. EOMi. Alert. Oriented x3 Psych: Normal affect, dress and demeanor. Normal speech. Normal thought content and judgment.    No exam data present  Assessment/plan: [name] is a 59 y.o. female present for CPE. Anxiety and depression Follows w/ psych  Essential hypertension, benign/Hyperlipidemia, unspecified hyperlipidemia type/Morbid obesity (HCC)/hypokalemia - Stable. Doing great w/ her weight loss.  - continue HCTZ 25 mg - continue Kdur BID . - reviewed cbc and cmp in the system from 03/2019.    - tsh, lipids collected today  Elevated hemoglobin A1c - Hemoglobin A1c  Vitamin D deficiency - Vitamin D (25 hydroxy)   Return in about 5 months (around 12/06/2019) for [facility] (30 min).  Orders Placed This Encounter  Procedures  . Vitamin D (25 hydroxy)  . Hemoglobin A1c  . TSH  . Lipid panel   Meds ordered this encounter  Medications  . hydrochlorothiazide (HYDRODIURIL) 25 MG tablet    Sig: Take 1 tablet (25 mg total) by mouth daily.    Dispense:  90 tablet    Refill:  1  . potassium chloride SA (KLOR-CON) 20 MEQ tablet    Sig: Take 1 tablet (20 mEq total) by mouth 2 (two) times daily.    Dispense:  180 tablet    Refill:  4   Referral Orders  No referral(s) requested today     Electronically signed by: Howard Pouch, [place]

## 2019-06-27 NOTE — Patient Instructions (Addendum)
Great to see you today.  You look great! Keep up the good work with your weight loss.   We will call you with lab results.   Happy [place]!  Follow up prior to 6 months.

## 2019-09-05 DIAGNOSIS — Z905 Acquired absence of kidney: Secondary | ICD-10-CM | POA: Diagnosis not present

## 2019-09-05 DIAGNOSIS — C642 Malignant neoplasm of left kidney, except renal pelvis: Secondary | ICD-10-CM | POA: Diagnosis not present

## 2019-09-05 DIAGNOSIS — K760 Fatty (change of) liver, not elsewhere classified: Secondary | ICD-10-CM | POA: Diagnosis not present

## 2019-09-05 DIAGNOSIS — C641 Malignant neoplasm of right kidney, except renal pelvis: Secondary | ICD-10-CM | POA: Diagnosis not present

## 2019-12-08 ENCOUNTER — Other Ambulatory Visit: Payer: Self-pay | Admitting: Family Medicine

## 2019-12-08 DIAGNOSIS — I10 Essential (primary) hypertension: Secondary | ICD-10-CM

## 2019-12-11 NOTE — Telephone Encounter (Signed)
Sent 30 day supply. Pt has upcoming appt can refill at that time.

## 2019-12-28 ENCOUNTER — Ambulatory Visit: Payer: Medicare Other | Admitting: Family Medicine

## 2020-01-03 ENCOUNTER — Ambulatory Visit: Payer: Medicare Other | Admitting: Family Medicine

## 2020-01-04 ENCOUNTER — Other Ambulatory Visit: Payer: Self-pay

## 2020-01-07 ENCOUNTER — Ambulatory Visit (INDEPENDENT_AMBULATORY_CARE_PROVIDER_SITE_OTHER): Payer: Medicare Other | Admitting: Family Medicine

## 2020-01-07 ENCOUNTER — Other Ambulatory Visit: Payer: Self-pay

## 2020-01-07 ENCOUNTER — Encounter: Payer: Self-pay | Admitting: Family Medicine

## 2020-01-07 VITALS — BP 116/62 | HR 83 | Ht 62.0 in | Wt 162.0 lb

## 2020-01-07 DIAGNOSIS — Z23 Encounter for immunization: Secondary | ICD-10-CM

## 2020-01-07 DIAGNOSIS — I10 Essential (primary) hypertension: Secondary | ICD-10-CM

## 2020-01-07 DIAGNOSIS — F4322 Adjustment disorder with anxiety: Secondary | ICD-10-CM | POA: Diagnosis not present

## 2020-01-07 MED ORDER — ZOSTER VAC RECOMB ADJUVANTED 50 MCG/0.5ML IM SUSR: 0.5000 mL | Freq: Once | INTRAMUSCULAR | 1 refills | Status: AC

## 2020-01-07 MED ORDER — HYDROCHLOROTHIAZIDE 25 MG PO TABS: 25.0000 mg | ORAL_TABLET | Freq: Every day | ORAL | 1 refills | Status: DC

## 2020-01-07 MED ORDER — POTASSIUM CHLORIDE CRYS ER 20 MEQ PO TBCR: 20.0000 meq | EXTENDED_RELEASE_TABLET | Freq: Two times a day (BID) | ORAL | 4 refills | Status: DC

## 2020-01-07 NOTE — Addendum Note (Signed)
Addended by: Deveron Furlong D on: 01/07/2020 09:52 AM   Modules accepted: Orders

## 2020-01-07 NOTE — Progress Notes (Signed)
Patient ID: [name], female  DOB: Apr 14, 1960, 59 y.o.   MRN: [id number] Patient Care Team    Relationship Specialty Notifications Start End  Ma Hillock, DO PCP - General Family Medicine  03/05/15   Gwendel Hanson, MD Referring Physician Urology  04/08/17   Aviva Signs, MD Referring Physician Gastroenterology  04/08/17   Deneise Lever, MD Consulting Physician Pulmonary Disease  04/08/17     Chief Complaint  Patient presents with   Follow-up    [facility]    Subjective: [name] is a 59 y.o.  Female  present for [facility] follow up Hypertension/hyperlipidemia/BMI 30/elevated a1c/hypok: Pt reports compliance  with HCTZ 25 mg QD.  Pt does not take a  daily baby ASA. Pt is not prescribed statin. Diet: Cutting out soda, sugar Exercise: routinely.  Reviewed recent labs in system RF: HTN, HLD, obesity, FHx HD Lost 4 lbs. Since last visit.   H/O RCC:  Pt has a h/o RCC left kidney with partial nephrectomy and RCC right kidney with cryoablation.  Since her last visit she has undergone some more changes with her renal cell carcinoma. She is following with specialties routinely every 6 months for now.   Depression screen [facility] 2/9 06/27/2019 12/15/2018 06/13/2018 04/08/2017 03/24/2016  Decreased Interest 0 0 1 0 0  Down, Depressed, Hopeless 0 0 1 0 0  PHQ - 2 Score 0 0 2 0 0  Altered sleeping 0 0 3 - -  Tired, decreased energy 0 0 2 - -  Change in appetite 0 0 2 - -  Feeling bad or failure about yourself  0 0 0 - -  Trouble concentrating 0 0 2 - -  Moving slowly or fidgety/restless 0 0 0 - -  Suicidal thoughts 0 0 0 - -  PHQ-9 Score 0 0 11 - -  Difficult doing work/chores Not difficult at all Not difficult at all Not difficult at all - -   GAD 7 : Generalized Anxiety Score 06/27/2019 12/15/2018 06/13/2018  Nervous, Anxious, on Edge 0 0 2  Control/stop worrying 0 0 1  Worry too much - different things 0 0 1  Trouble relaxing 0 0 0  Restless 0 0 0  Easily  annoyed or irritable 0 0 2  Afraid - awful might happen 0 0 1  Total GAD 7 Score 0 0 7  Anxiety Difficulty Not difficult at all Not difficult at all Not difficult at all    Immunization History  Administered Date(s) Administered   Influenza Split 01/24/2012, 01/06/2013, 01/03/2014, 01/15/2015   Influenza,inj,Quad PF,6+ Mos 12/16/2014, 01/06/2016, 11/25/2016, 12/15/2017, 12/15/2018   PFIZER SARS-COV-2 Vaccination 06/21/2019, 07/12/2019   Pneumococcal Conjugate-13 03/24/2015   Pneumococcal Polysaccharide-23 12/17/2013   Tdap 03/24/2015     Past Medical History:  Diagnosis Date   Anxiety    psychiatrist: Dr. Robina Ade    Arthritis    Colitis    Colon polyps    COPD (chronic obstructive pulmonary disease) ([place])    chronic bronchitis   Depression    psychiatrist: Dr. Robina Ade   Elevated liver enzymes    Dr. Rulon Abide following   Fatty liver    GERD (gastroesophageal reflux disease)    Heart murmur    History of chicken pox    History of kidney stones    Renal cell carcinoma    hx of left and right; Dr. Nena Alexander   No Known Allergies Past Surgical History:  Procedure Laterality Date  CHOLECYSTECTOMY     FETAL RADIO FREQUENCY ABLATION     FRACTURE SURGERY Left    ankle   JOINT REPLACEMENT Left 2009   knee   NEPHRECTOMY     partial LT   OPEN SURGICAL REPAIR OF GLUTEAL TENDON Left 09/27/2018   Procedure: Left hip bursectomy; gluteal tendon repair;  Surgeon: Gaynelle Arabian, MD;  Location: [facility];  Service: Orthopedics;  Laterality: Left;  49min   TONSILLECTOMY     Family History  Problem Relation Age of Onset   Hypertension Mother    Alzheimer's disease Mother    Arthritis Mother    Hypertension Father    Alzheimer's disease Father    COPD Father    Heart disease Father    Arthritis Father    Hypertension Sister    Social History   Social History Narrative   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    Lives with her husband, son and granddaughter.   Drinks caffeinated beverages.   Wears her seatbelt, wears a bicycle helmet, there is a smoke detector in home. There are no firearms in the home.   Patient feels safe in her relationships.    Allergies as of 01/07/2020   No Known Allergies     Medication List       Accurate as of January 07, 2020  9:29 AM. If you have any questions, ask your nurse or doctor.        albuterol (2.5 MG/3ML) 0.083% nebulizer solution Commonly known as: PROVENTIL Take 3 mLs (2.5 mg total) by nebulization every 6 (six) hours as needed for wheezing or shortness of breath.   albuterol 108 (90 Base) MCG/ACT inhaler Commonly known as: VENTOLIN HFA Inhale 1-2 puffs into the lungs every 6 (six) hours as needed for shortness of breath.   ALPRAZolam 0.5 MG tablet Commonly known as: XANAX Take 0.5 mg by mouth 2 (two) times daily as needed for anxiety.   amphetamine-dextroamphetamine 30 MG 24 hr capsule Commonly known as: ADDERALL XR Take 30 mg by mouth daily.   Fluticasone-Salmeterol 100-50 MCG/DOSE Aepb Commonly known as: Advair Diskus Inhale 1 puff into the lungs 2 (two) times daily.   hydrochlorothiazide 25 MG tablet Commonly known as: HYDRODIURIL Take 1 tablet (25 mg total) by mouth daily.   ibuprofen 200 MG tablet Commonly known as: ADVIL Take by mouth.   Magnesium 250 MG Tabs Take 250 mg by mouth daily.   OLANZapine-FLUoxetine 3-25 MG capsule Commonly known as: SYMBYAX Take 1 capsule by mouth every evening.   potassium chloride SA 20 MEQ tablet Commonly known as: KLOR-CON Take 1 tablet (20 mEq total) by mouth 2 (two) times daily.   Vitamin B-12 3000 MCG Subl Place 3,000 mcg under the tongue daily.   vitamin C 500 MG tablet Commonly known as: ASCORBIC ACID Take 500 mg by mouth daily.   Vitamin D-3 125 MCG (5000 UT) Tabs Take 5,000 Units by mouth daily.       All past medical history, surgical history, allergies, family  history, immunizations andmedications were updated in the EMR today and reviewed under the history and medication portions of their EMR.      ROS: 14 pt review of systems performed and negative (unless mentioned in an HPI)  Objective: BP 116/62    Pulse 83    Ht 5\' 2"  (1.575 m)    Wt 162 lb (73.5 kg)    LMP 04/22/2013    SpO2  98%    BMI 29.63 kg/m  Gen: Afebrile. No acute distress.  HENT: AT. [place]. Eyes:Pupils Equal Round Reactive to light, Extraocular movements intact,  Conjunctiva without redness, discharge or icterus. Neck/lymp/endocrine: Supple,no lymphadenopathy CV: RRR no murmur Chest: CTAB, no wheeze or crackles Abd: Soft. NTND. BS present Skin: no rashes, purpura or petechiae.  Neuro: Normal gait. PERLA. EOMi. Alert. Oriented x3  Psych: Normal affect, dress and demeanor. Normal speech. Normal thought content and judgment.   No exam data present  Assessment/plan: [name] is a 59 y.o. female present for CPE. Anxiety and depression Follows w/ psych  Essential hypertension, benign/Hyperlipidemia, unspecified hyperlipidemia type/Morbid obesity (HCC)/hypokalemia - stable.  - Doing great w/ her weight loss.  - Continue  HCTZ 25 mg - Continue  Kdur BID .  Elevated hemoglobin A1c - UTD and normal 06/27/2019  Vitamin D deficiency - UTD and normal 06/2019  Influenza vaccine administered today. Discussed shingrix vaccines and covid booster.   Shingrix script printed.   Return in about 25 weeks (around 06/30/2020) for [place] (30 min).  No orders of the defined types were placed in this encounter.  Meds ordered this encounter  Medications   hydrochlorothiazide (HYDRODIURIL) 25 MG tablet    Sig: Take 1 tablet (25 mg total) by mouth daily.    Dispense:  90 tablet    Refill:  1   potassium chloride SA (KLOR-CON) 20 MEQ tablet    Sig: Take 1 tablet (20 mEq total) by mouth 2 (two) times daily.    Dispense:  180 tablet    Refill:  4   Referral Orders  No  referral(s) requested today     Electronically signed by: Howard Pouch, [place]

## 2020-01-07 NOTE — Patient Instructions (Addendum)
Great tos see you today.  We will continue the hctz for now. If we need to we can consider switching to another type of med.  Makr sure to take your potassium pills twice a day.    Influenza vaccine provided today.

## 2020-01-15 DIAGNOSIS — Z23 Encounter for immunization: Secondary | ICD-10-CM | POA: Diagnosis not present

## 2020-01-21 DIAGNOSIS — F4322 Adjustment disorder with anxiety: Secondary | ICD-10-CM | POA: Diagnosis not present

## 2020-02-05 ENCOUNTER — Ambulatory Visit (INDEPENDENT_AMBULATORY_CARE_PROVIDER_SITE_OTHER): Payer: Medicare Other | Admitting: Internal Medicine

## 2020-02-05 ENCOUNTER — Other Ambulatory Visit: Payer: Self-pay

## 2020-02-05 ENCOUNTER — Encounter: Payer: Self-pay | Admitting: Internal Medicine

## 2020-02-05 DIAGNOSIS — J449 Chronic obstructive pulmonary disease, unspecified: Secondary | ICD-10-CM | POA: Diagnosis not present

## 2020-02-05 DIAGNOSIS — C649 Malignant neoplasm of unspecified kidney, except renal pelvis: Secondary | ICD-10-CM | POA: Diagnosis not present

## 2020-02-05 NOTE — Progress Notes (Signed)
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 MAIC, GERD, complicated by history renal cell CA PFT- 2008-  CT chest [facility] 12/13/13- Ground glass, tree-in-bud, and micronodular opacities in a bronchovascular pattern involving the right upper lobe are favored to be related to an infectious process. Recommend repeat chest CT after appropriate therapy to document resolution. CT chest 03/15/16 No evidence of interstitial lung disease or mycobacterium aviumcomplex. Scattered pulmonary nodules. 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 MAIC, GERD, complicated by history renal cell CA Hosp in June for L hip bursitis repair Had ablation recurrent renal Ca- followed at [facility]. Doing well. -----1 year follow up for Asthma Asthma control score 25 Breathing has been very good. Insurance changed Symbicort to Advair 100 which works fine.  Covid careful. Discussed potential vaccine. UTD flu and Pvax. Hasn't needed rescue inhaler all summer. Lung nodules stable- discussed imaging.  CXR 02/02/18-  No active cardiopulmonary disease.  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 MAIC, GERD/ LPR, complicated by history renal cell CA, HTN,  Advair 100, Ventolin hfa, neb albuterol,  Covid vax- 3 Phizer Flu vax- had Reports breathing has been "great'. Rarely needing rescue inhaler, using Advair 1x/ daily. Credits Covid mask and precautions for limiting exposure to infections.  Reports clear CXR at [facility] in June for f/u of renal cancer, with no recurrence.   ROS-see HPI   + = positive Constitutional:   No-   weight loss, night sweats, fevers, chills, fatigue, lassitude. HEENT:    +headaches, difficulty swallowing, tooth/dental problems, sore throat,       No-  sneezing, itching,  ear ache, nasal congestion, post nasal drip,  CV:  No-  chest pain, orthopnea, PND, swelling in lower extremities, anasarca, dizziness, palpitations Resp: +shortness of breath with exertion or at rest.             No-productive cough,   non-productive cough,  No- coughing up of blood.              -change in color of mucus. No- wheezing.   Skin: No-   rash or lesions. GI:  + heartburn, indigestion, No-abdominal pain, nausea, vomiting,  GU: . MS:  No-   joint pain or swelling.   Neuro-     nothing unusual Psych:  No- change in mood or affect. No depression or anxiety.  No memory loss.  OBJ- Physical Exam General- Alert, Oriented, Affect-appropriate, Distress- none acute. + Overweight. Skin- rash-none, lesions- none, excoriation- none Lymphadenopathy- none Head- atraumatic            Eyes- Gross vision intact, PERRLA, conjunctivae and secretions clear            Ears- clear            Nose- no- turbinate edema, no-Septal dev, mucus, polyps, erosion, perforation,            Throat- Mallampati II , mucosa-red , drainage+, tonsils- atrophic,   Neck- flexible , trachea midline, no stridor , thyroid nl, carotid no bruit Chest - symmetrical excursion , unlabored           Heart/CV- RRR , no murmur , no gallop  , no rub, nl s1 s2                           -  JVD- none , edema- none, stasis changes- none, varices- none           Lung- clear unlabored, wheeze- none, cough- none, dullness-none, rub- none           Chest wall-  Abd-  Br/ Gen/ Rectal- Not done, not indicated Extrem- cyanosis- none, clubbing, none, atrophy- none, strength- nl Neuro- grossly intact to observation

## 2020-02-05 NOTE — Patient Instructions (Addendum)
We can continue current meds  Please call if we can help 

## 2020-02-11 DIAGNOSIS — F4322 Adjustment disorder with anxiety: Secondary | ICD-10-CM | POA: Diagnosis not present

## 2020-02-23 NOTE — Assessment & Plan Note (Signed)
Followed at [facility]. Reports no recurrence at this time.

## 2020-02-23 NOTE — Assessment & Plan Note (Signed)
Mild persistent uncomplicated with good control on single puff Advair and rare need for rescue inhaler Plan- call for refills as needed

## 2020-03-04 ENCOUNTER — Other Ambulatory Visit: Payer: Self-pay | Admitting: Family Medicine

## 2020-03-04 DIAGNOSIS — Z1231 Encounter for screening mammogram for malignant neoplasm of breast: Secondary | ICD-10-CM

## 2020-03-05 DIAGNOSIS — Z08 Encounter for follow-up examination after completed treatment for malignant neoplasm: Secondary | ICD-10-CM | POA: Diagnosis not present

## 2020-03-05 DIAGNOSIS — K760 Fatty (change of) liver, not elsewhere classified: Secondary | ICD-10-CM | POA: Diagnosis not present

## 2020-03-05 DIAGNOSIS — C641 Malignant neoplasm of right kidney, except renal pelvis: Secondary | ICD-10-CM | POA: Diagnosis not present

## 2020-03-05 DIAGNOSIS — Z905 Acquired absence of kidney: Secondary | ICD-10-CM | POA: Diagnosis not present

## 2020-03-05 DIAGNOSIS — Z85528 Personal history of other malignant neoplasm of kidney: Secondary | ICD-10-CM | POA: Diagnosis not present

## 2020-03-10 DIAGNOSIS — F4322 Adjustment disorder with anxiety: Secondary | ICD-10-CM | POA: Diagnosis not present

## 2020-04-16 ENCOUNTER — Ambulatory Visit: Payer: Medicare Other

## 2020-05-27 DIAGNOSIS — H2513 Age-related nuclear cataract, bilateral: Secondary | ICD-10-CM | POA: Diagnosis not present

## 2020-05-27 DIAGNOSIS — H524 Presbyopia: Secondary | ICD-10-CM | POA: Diagnosis not present

## 2020-05-27 DIAGNOSIS — G43B0 Ophthalmoplegic migraine, not intractable: Secondary | ICD-10-CM | POA: Diagnosis not present

## 2020-05-28 ENCOUNTER — Ambulatory Visit
Admission: RE | Admit: 2020-05-28 | Discharge: 2020-05-28 | Disposition: A | Discharge status: Home / Self Care | Payer: Medicare Other | Source: Ambulatory Visit | Attending: Family Medicine | Admitting: Family Medicine

## 2020-05-28 ENCOUNTER — Other Ambulatory Visit: Payer: Self-pay

## 2020-05-28 DIAGNOSIS — Z1231 Encounter for screening mammogram for malignant neoplasm of breast: Secondary | ICD-10-CM

## 2020-06-13 DIAGNOSIS — M25561 Pain in right knee: Secondary | ICD-10-CM | POA: Diagnosis not present

## 2020-06-16 ENCOUNTER — Telehealth: Payer: Self-pay | Admitting: Family Medicine

## 2020-06-16 NOTE — Telephone Encounter (Signed)
Left message for patient to schedule Annual Wellness Visit.  Please schedule with Nurse Health Advisor Martha Stanley, RN at [facility] [place]  °

## 2020-06-23 ENCOUNTER — Other Ambulatory Visit: Payer: Self-pay | Admitting: Internal Medicine

## 2020-06-24 ENCOUNTER — Ambulatory Visit: Payer: Medicare Other | Admitting: Family Medicine

## 2020-06-24 ENCOUNTER — Other Ambulatory Visit: Payer: Self-pay | Admitting: Family Medicine

## 2020-06-24 DIAGNOSIS — I10 Essential (primary) hypertension: Secondary | ICD-10-CM

## 2020-06-27 ENCOUNTER — Other Ambulatory Visit: Payer: Self-pay

## 2020-06-27 ENCOUNTER — Ambulatory Visit (INDEPENDENT_AMBULATORY_CARE_PROVIDER_SITE_OTHER): Payer: Medicare Other | Admitting: Family Medicine

## 2020-06-27 ENCOUNTER — Encounter: Payer: Self-pay | Admitting: Family Medicine

## 2020-06-27 VITALS — BP 129/78 | HR 73 | Temp 99.1°F | Ht 62.0 in | Wt 162.0 lb

## 2020-06-27 DIAGNOSIS — F419 Anxiety disorder, unspecified: Secondary | ICD-10-CM | POA: Diagnosis not present

## 2020-06-27 DIAGNOSIS — I10 Essential (primary) hypertension: Secondary | ICD-10-CM | POA: Diagnosis not present

## 2020-06-27 DIAGNOSIS — E663 Overweight: Secondary | ICD-10-CM | POA: Diagnosis not present

## 2020-06-27 DIAGNOSIS — N950 Postmenopausal bleeding: Secondary | ICD-10-CM | POA: Diagnosis not present

## 2020-06-27 DIAGNOSIS — E559 Vitamin D deficiency, unspecified: Secondary | ICD-10-CM

## 2020-06-27 DIAGNOSIS — K760 Fatty (change of) liver, not elsewhere classified: Secondary | ICD-10-CM

## 2020-06-27 DIAGNOSIS — E2839 Other primary ovarian failure: Secondary | ICD-10-CM | POA: Diagnosis not present

## 2020-06-27 DIAGNOSIS — E876 Hypokalemia: Secondary | ICD-10-CM | POA: Diagnosis not present

## 2020-06-27 DIAGNOSIS — E785 Hyperlipidemia, unspecified: Secondary | ICD-10-CM

## 2020-06-27 DIAGNOSIS — F32A Depression, unspecified: Secondary | ICD-10-CM

## 2020-06-27 DIAGNOSIS — M8580 Other specified disorders of bone density and structure, unspecified site: Secondary | ICD-10-CM

## 2020-06-27 DIAGNOSIS — R7309 Other abnormal glucose: Secondary | ICD-10-CM | POA: Diagnosis not present

## 2020-06-27 HISTORY — DX: Postmenopausal bleeding: N95.0

## 2020-06-27 LAB — CBC WITH DIFFERENTIAL/PLATELET
Basophils Absolute: 0 10*3/uL (ref 0.0–0.1)
Basophils Relative: 0.2 % (ref 0.0–3.0)
Eosinophils Absolute: 0.2 10*3/uL (ref 0.0–0.7)
Eosinophils Relative: 3.3 % (ref 0.0–5.0)
HCT: 43.5 % (ref 36.0–46.0)
Hemoglobin: 14.2 g/dL (ref 12.0–15.0)
Lymphocytes Relative: 31.7 % (ref 12.0–46.0)
Lymphs Abs: 2.3 10*3/uL (ref 0.7–4.0)
MCHC: 32.7 g/dL (ref 30.0–36.0)
MCV: 89 fl (ref 78.0–100.0)
Monocytes Absolute: 0.4 10*3/uL (ref 0.1–1.0)
Monocytes Relative: 5.2 % (ref 3.0–12.0)
Neutro Abs: 4.3 10*3/uL (ref 1.4–7.7)
Neutrophils Relative %: 59.6 % (ref 43.0–77.0)
Platelets: 437 10*3/uL — ABNORMAL HIGH (ref 150.0–400.0)
RBC: 4.88 Mil/uL (ref 3.87–5.11)
RDW: 15.3 % (ref 11.5–15.5)
WBC: 7.3 10*3/uL (ref 4.0–10.5)

## 2020-06-27 LAB — LIPID PANEL
Cholesterol: 253 mg/dL — ABNORMAL HIGH (ref 0–200)
HDL: 59.5 mg/dL (ref >39.00)
LDL Cholesterol: 167 mg/dL — ABNORMAL HIGH (ref 0–99)
NonHDL: 193.16
Total CHOL/HDL Ratio: 4
Triglycerides: 129 mg/dL (ref 0.0–149.0)
VLDL: 25.8 mg/dL (ref 0.0–40.0)

## 2020-06-27 LAB — TSH: TSH: 1.53 u[IU]/mL (ref 0.35–4.50)

## 2020-06-27 LAB — HEMOGLOBIN A1C: Hgb A1c MFr Bld: 5.7 % (ref 4.6–6.5)

## 2020-06-27 LAB — VITAMIN D 25 HYDROXY (VIT D DEFICIENCY, FRACTURES): VITD: 63.41 ng/mL (ref 30.00–100.00)

## 2020-06-27 MED ORDER — HYDROCHLOROTHIAZIDE 25 MG PO TABS: 25.0000 mg | ORAL_TABLET | Freq: Every day | ORAL | 1 refills | Status: DC

## 2020-06-27 NOTE — Progress Notes (Signed)
Patient ID: [name], female  DOB: July 28, 1960, 60 y.o.   MRN: [id number] Patient Care Team    Relationship Specialty Notifications Start End  Ma Hillock, DO PCP - General Family Medicine  03/05/15   Gwendel Hanson, MD Referring Physician Urology  04/08/17   Aviva Signs, MD Referring Physician Gastroenterology  04/08/17   Deneise Lever, MD Consulting Physician Pulmonary Disease  04/08/17     Chief Complaint  Patient presents with   Follow-up    [facility];    Vaginal Bleeding    Pt c/o clots and vaginal bleeding , lower abd pain x 2 days    Subjective: [name] is a 60 y.o.  Female  present for [facility] follow up Hypertension/hyperlipidemia/BMI 30/elevated a1c/hypok: Pt reports compliance   with HCTZ 25 mg QD.  Pt does not take a  daily baby ASA. Pt is not prescribed statin. Diet: Cutting out soda, sugar Exercise: routinely.  Reviewed recent labs in system RF: HTN, HLD, obesity, FHx HD  H/O RCC:  Pt has a h/o RCC left kidney with partial nephrectomy and RCC right kidney with cryoablation.  Since her last visit she has undergone some more changes with her renal cell carcinoma. She is following with specialties routinely every 6 months for now.   Postmenopausal bleeding: Patient reports her last menstrual period was at age 66.  She has not experienced any spotting since that time.  2 days ago she started experiencing lower abdominal cramping and she felt pressure in her lower abdomen.  When she went to use the bathroom she noticed small brown tissue in the toilet and when she use the toilet paper she had brown to bright red blood present.  She has since experienced vaginal bleeding.  She does endorse the vaginal bleeding is slowing down.  She has a history of renal cell carcinoma.  She is up-to-date with her Pap smear, last normal Pap with negative HPV.  She has been using a heating pad over her stomach which is helped with the cramping.  Depression  screen [facility] 2/9 06/27/2020 06/27/2019 12/15/2018 06/13/2018 04/08/2017  Decreased Interest 0 0 0 1 0  Down, Depressed, Hopeless 0 0 0 1 0  PHQ - 2 Score 0 0 0 2 0  Altered sleeping 0 0 0 3 -  Tired, decreased energy 0 0 0 2 -  Change in appetite 0 0 0 2 -  Feeling bad or failure about yourself  0 0 0 0 -  Trouble concentrating 0 0 0 2 -  Moving slowly or fidgety/restless 0 0 0 0 -  Suicidal thoughts 0 0 0 0 -  PHQ-9 Score 0 0 0 11 -  Difficult doing work/chores - Not difficult at all Not difficult at all Not difficult at all -   GAD 7 : Generalized Anxiety Score 06/27/2019 12/15/2018 06/13/2018  Nervous, Anxious, on Edge 0 0 2  Control/stop worrying 0 0 1  Worry too much - different things 0 0 1  Trouble relaxing 0 0 0  Restless 0 0 0  Easily annoyed or irritable 0 0 2  Afraid - awful might happen 0 0 1  Total GAD 7 Score 0 0 7  Anxiety Difficulty Not difficult at all Not difficult at all Not difficult at all    Immunization History  Administered Date(s) Administered   Influenza Split 01/24/2012, 01/06/2013, 01/03/2014, 01/15/2015   Influenza,inj,Quad PF,6+ Mos 12/16/2014, 01/06/2016, 11/25/2016, 12/15/2017, 12/15/2018, 01/07/2020  Influenza-Unspecified 12/16/2014, 01/06/2016, 11/25/2016, 12/15/2017, 12/15/2018, 01/07/2020   PFIZER(Purple Top)SARS-COV-2 Vaccination 06/21/2019, 07/12/2019, 01/15/2020   Pneumococcal Conjugate-13 03/24/2015   Pneumococcal Polysaccharide-23 12/17/2013   Tdap 03/24/2015     Past Medical History:  Diagnosis Date   Anxiety    psychiatrist: Dr. Robina Ade    Arthritis    Colitis    Colon polyps    COPD (chronic obstructive pulmonary disease) ([place])    chronic bronchitis   Depression    psychiatrist: Dr. Robina Ade   Elevated liver enzymes    Dr. Rulon Abide following   Fatty liver    GERD (gastroesophageal reflux disease)    Heart murmur    History of chicken pox    History of kidney stones    Renal cell carcinoma    hx of left and right;  Dr. Nena Alexander   No Known Allergies Past Surgical History:  Procedure Laterality Date   CHOLECYSTECTOMY     FETAL RADIO FREQUENCY ABLATION     FRACTURE SURGERY Left    ankle   JOINT REPLACEMENT Left 2009   knee   NEPHRECTOMY     partial LT   OPEN SURGICAL REPAIR OF GLUTEAL TENDON Left 09/27/2018   Procedure: Left hip bursectomy; gluteal tendon repair;  Surgeon: Gaynelle Arabian, MD;  Location: [facility];  Service: Orthopedics;  Laterality: Left;  62min   TONSILLECTOMY     Family History  Problem Relation Age of Onset   Hypertension Mother    Alzheimer's disease Mother    Arthritis Mother    Hypertension Father    Alzheimer's disease Father    COPD Father    Heart disease Father    Arthritis Father    Hypertension Sister    Social History   Social History Narrative   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   Lives with her husband, son and granddaughter.   Drinks caffeinated beverages.   Wears her seatbelt, wears a bicycle helmet, there is a smoke detector in home. There are no firearms in the home.   Patient feels safe in her relationships.    Allergies as of 06/27/2020   No Known Allergies     Medication List       Accurate as of June 27, 2020  1:33 PM. If you have any questions, ask your nurse or doctor.        Advair Diskus 100-50 MCG/DOSE Aepb Generic drug: Fluticasone-Salmeterol USE 1 INHALATION TWO TIMES A DAY THEN RINSE MOUTH   albuterol (2.5 MG/3ML) 0.083% nebulizer solution Commonly known as: PROVENTIL Take 3 mLs (2.5 mg total) by nebulization every 6 (six) hours as needed for wheezing or shortness of breath.   albuterol 108 (90 Base) MCG/ACT inhaler Commonly known as: VENTOLIN HFA Inhale 1-2 puffs into the lungs every 6 (six) hours as needed for shortness of breath.   ALPRAZolam 0.5 MG tablet Commonly known as: XANAX Take 0.5 mg by mouth 2 (two) times daily as needed for anxiety.    amphetamine-dextroamphetamine 30 MG 24 hr capsule Commonly known as: ADDERALL XR Take 30 mg by mouth daily.   hydrochlorothiazide 25 MG tablet Commonly known as: HYDRODIURIL Take 1 tablet (25 mg total) by mouth daily.   ibuprofen 200 MG tablet Commonly known as: ADVIL Take by mouth.   Magnesium 250 MG Tabs Take 250 mg by mouth daily.   OLANZapine-FLUoxetine 3-25 MG capsule Commonly known as: SYMBYAX Take 1 capsule by mouth every evening.   potassium chloride  SA 20 MEQ tablet Commonly known as: KLOR-CON Take 1 tablet (20 mEq total) by mouth 2 (two) times daily.   Vitamin B-12 3000 MCG Subl Place 3,000 mcg under the tongue daily.   vitamin C 500 MG tablet Commonly known as: ASCORBIC ACID Take 500 mg by mouth daily.   Vitamin D-3 125 MCG (5000 UT) Tabs Take 5,000 Units by mouth daily.       All past medical history, surgical history, allergies, family history, immunizations andmedications were updated in the EMR today and reviewed under the history and medication portions of their EMR.      ROS: 14 pt review of systems performed and negative (unless mentioned in an HPI)  Objective: BP 129/78    Pulse 73    Temp 99.1 F (37.3 C) (Oral)    Ht 5\' 2"  (1.575 m)    Wt 162 lb (73.5 kg)    LMP 04/22/2013    SpO2 99%    BMI 29.63 kg/m  Gen: Afebrile. No acute distress.  Nontoxic.  Pleasant female. HENT: AT. [name].  Eyes:Pupils Equal Round Reactive to light, Extraocular movements intact,  Conjunctiva without redness, discharge or icterus. CV: RRR no murmur, no edema, +2/4 P posterior tibialis pulses Chest: CTAB, no wheeze or crackles Abd: Soft.. NTND. BS present. .   Neuro: Normal gait. PERLA. EOMi. Alert. Oriented x3 Psych: Normal affect, dress and demeanor. Normal speech. Normal thought content and judgment.    No exam data present  Assessment/plan: [name] is a 60 y.o. female present for CPE. Anxiety and depression Follows w/ psych  Essential  hypertension, benign/Hyperlipidemia, unspecified hyperlipidemia type/Morbid obesity (HCC)/hypokalemia -stable - Doing great w/ her weight loss.  - continue  HCTZ 25 mg - continue   Kdur BID . -CBC, TSH, lipid collected today-not fasting -Reviewed CMP collected in December by her specialty team Follow-up 5.5 months for chronic conditions  Elevated hemoglobin A1c - a1c collected today  Vitamin D deficiency - vit d collected today -DEXA ordered  Postmenopausal bleeding Discussed with patient that any postmenopausal bleeding is considered abnormal until proven otherwise. CBC collected today. Referral to gynecology placed urgently Pelvic abdominal ultrasound with transvaginal ultrasound order placed.  Return in about 6 months (around 12/15/2020).  Orders Placed This Encounter  Procedures   DG Bone Density   US Pelvic Complete With Transvaginal   CBC w/Diff   TSH   Hemoglobin A1c   Lipid panel   Vitamin D (25 hydroxy)   Ambulatory referral to Obstetrics / Gynecology   No orders of the defined types were placed in this encounter.   Referral Orders     Ambulatory referral to Obstetrics / Gynecology   Electronically signed by: Howard Pouch, DO [place]

## 2020-06-27 NOTE — Patient Instructions (Signed)
Referral placed for gynecology and Ultrasound  Refilled meds.  We will call you with results.

## 2020-06-30 ENCOUNTER — Ambulatory Visit: Payer: Medicare Other | Admitting: Family Medicine

## 2020-06-30 ENCOUNTER — Telehealth: Payer: Self-pay | Admitting: Family Medicine

## 2020-06-30 NOTE — Telephone Encounter (Signed)
Spoke with patient regarding results/recommendations.  Will call when she makes decision on Rx

## 2020-06-30 NOTE — Telephone Encounter (Signed)
Please call patient thyroid function is normal Vitamin D level is normal Blood cell counts normal Diabetes screening/A1c is normal  Cholesterol panel is higher than in the past with an LDL greater than 160 (167).  She is at increased risk for cardiovascular events such as heart attack or stroke with a cholesterol at this level.  American Heart Association criteria recommends she consider a statin start.  She has declined statin start in the past. She is agreeable to start a statin-I will call this in for her.  If not then we at least need to lower the LDL and can try medication called Zetia, which is not a statin and can lower her LDL.  Zetia helps lower cholesterol, but does not have the extra cardiovascular protection of a statin. Please advise of her decision.

## 2020-07-02 ENCOUNTER — Other Ambulatory Visit: Payer: Medicare Other

## 2020-07-22 ENCOUNTER — Other Ambulatory Visit: Payer: Self-pay | Admitting: Family Medicine

## 2020-07-22 DIAGNOSIS — E2839 Other primary ovarian failure: Secondary | ICD-10-CM

## 2020-07-22 DIAGNOSIS — M8580 Other specified disorders of bone density and structure, unspecified site: Secondary | ICD-10-CM

## 2020-07-22 DIAGNOSIS — E559 Vitamin D deficiency, unspecified: Secondary | ICD-10-CM

## 2020-07-29 DIAGNOSIS — N950 Postmenopausal bleeding: Secondary | ICD-10-CM | POA: Diagnosis not present

## 2020-07-29 LAB — HM PAP SMEAR

## 2020-08-04 DIAGNOSIS — Z9049 Acquired absence of other specified parts of digestive tract: Secondary | ICD-10-CM | POA: Diagnosis not present

## 2020-08-04 DIAGNOSIS — Z8719 Personal history of other diseases of the digestive system: Secondary | ICD-10-CM | POA: Diagnosis not present

## 2020-08-04 DIAGNOSIS — Z09 Encounter for follow-up examination after completed treatment for conditions other than malignant neoplasm: Secondary | ICD-10-CM | POA: Diagnosis not present

## 2020-08-04 LAB — RESULTS CONSOLE HPV: CHL HPV: NEGATIVE

## 2020-08-04 LAB — HM PAP SMEAR: Pap: NEGATIVE

## 2020-08-14 DIAGNOSIS — N950 Postmenopausal bleeding: Secondary | ICD-10-CM | POA: Diagnosis not present

## 2020-08-14 DIAGNOSIS — N840 Polyp of corpus uteri: Secondary | ICD-10-CM | POA: Diagnosis not present

## 2020-08-14 DIAGNOSIS — N882 Stricture and stenosis of cervix uteri: Secondary | ICD-10-CM | POA: Diagnosis not present

## 2020-08-26 DIAGNOSIS — Z23 Encounter for immunization: Secondary | ICD-10-CM | POA: Diagnosis not present

## 2020-09-03 DIAGNOSIS — U071 COVID-19: Secondary | ICD-10-CM

## 2020-09-03 HISTORY — DX: COVID-19: U07.1

## 2020-09-24 DIAGNOSIS — S61022A Laceration with foreign body of left thumb without damage to nail, initial encounter: Secondary | ICD-10-CM | POA: Diagnosis not present

## 2020-09-24 DIAGNOSIS — Z23 Encounter for immunization: Secondary | ICD-10-CM | POA: Diagnosis not present

## 2020-10-17 IMAGING — MG DIGITAL SCREENING BILAT W/ TOMO
8 series · 8 of 24 positions shown · non-contrast
Comparison: Previous exam(s).

CLINICAL DATA: Screening.

EXAM:
DIGITAL SCREENING BILATERAL MAMMOGRAM WITH TOMO AND CAD

[R CC synth-2D]
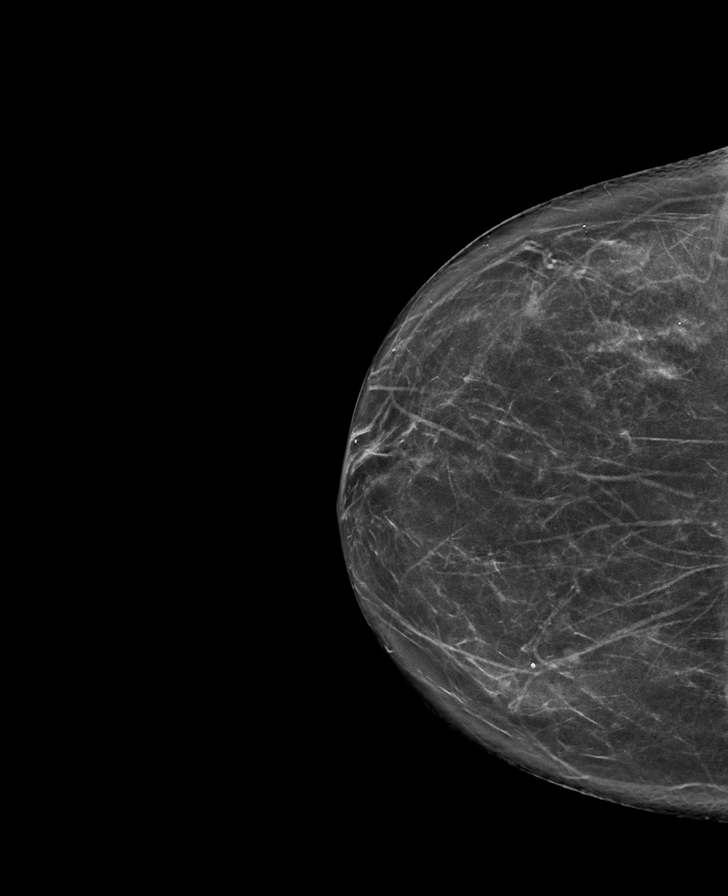

[R MLO synth-2D]
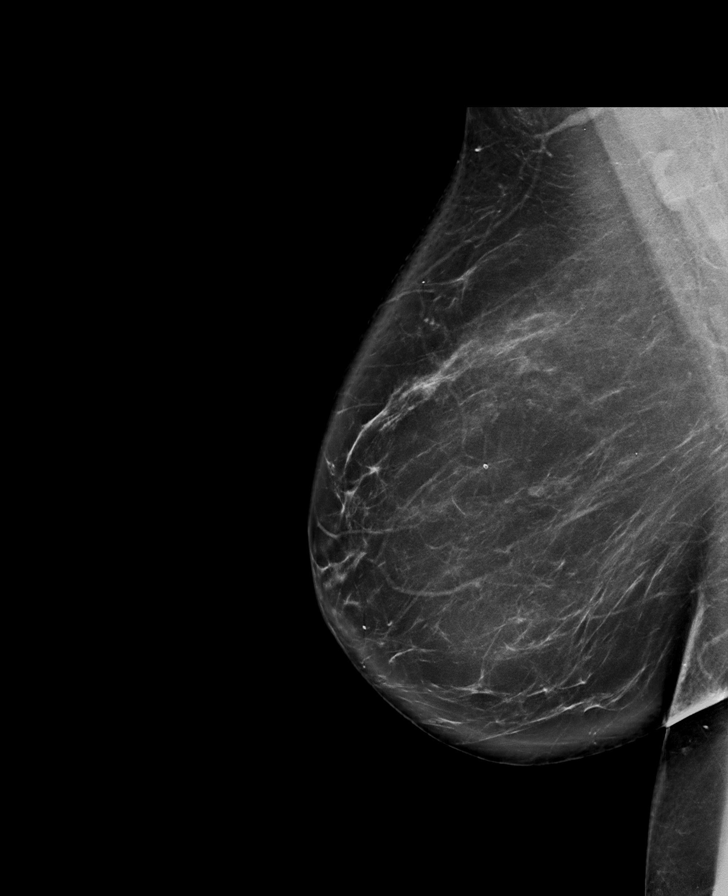

[L CC synth-2D]
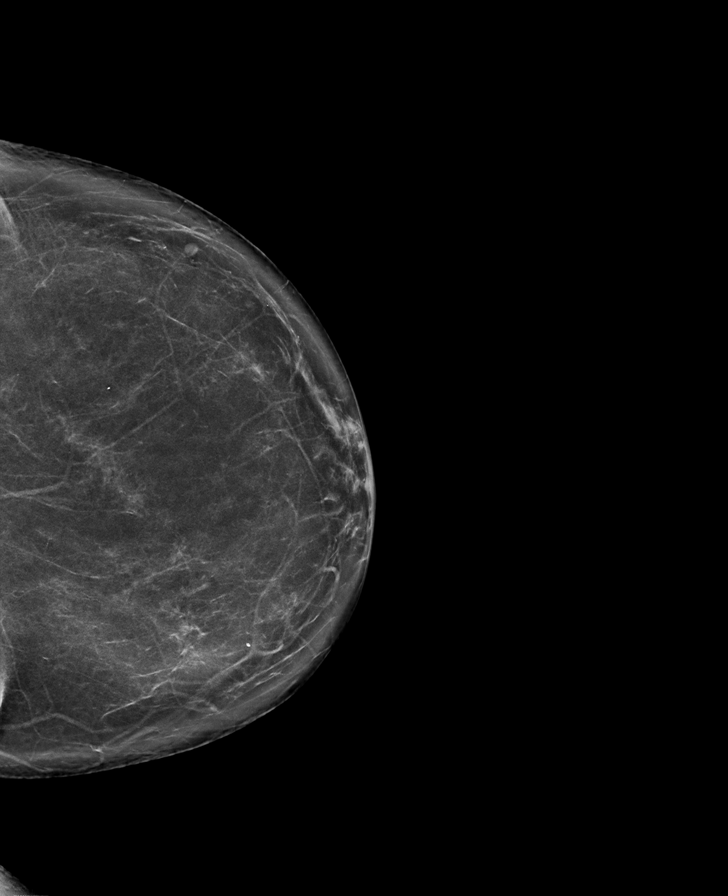

[L MLO synth-2D]
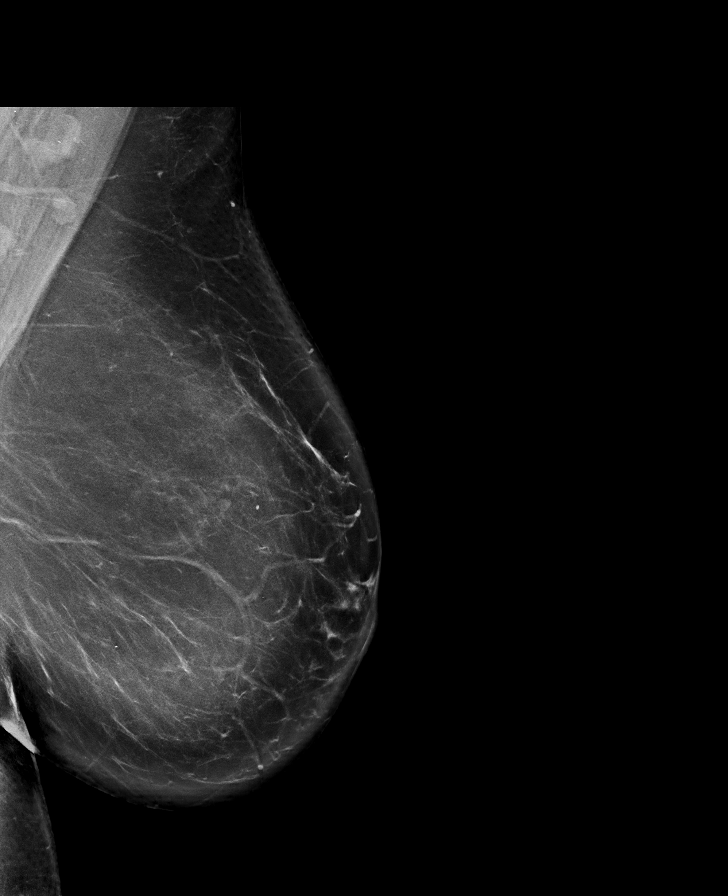

[L CC tomo · tomo slice 41/81.0]
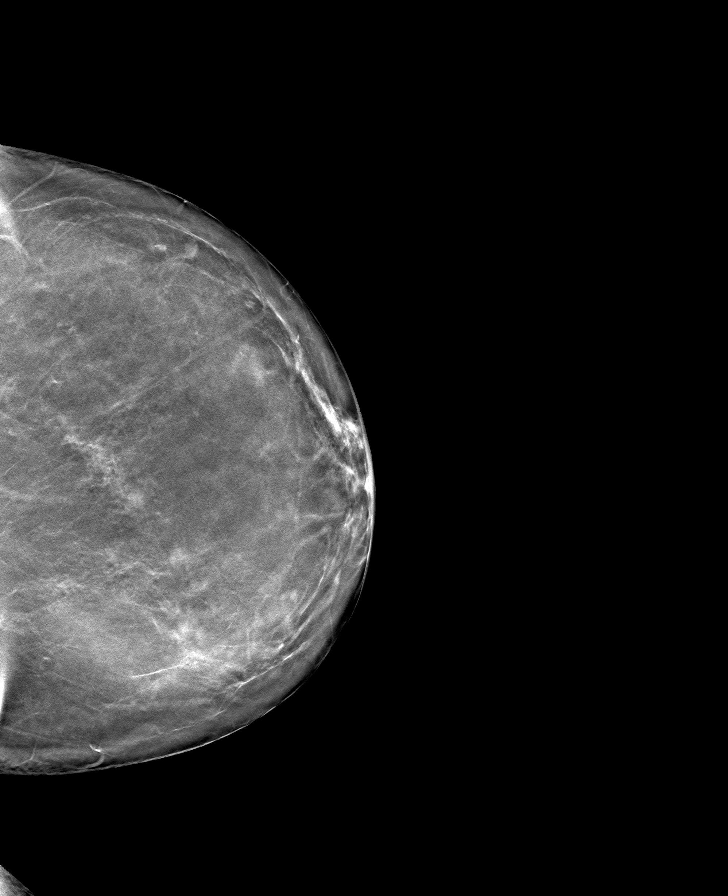

[R MLO tomo · tomo slice 49/96.0]
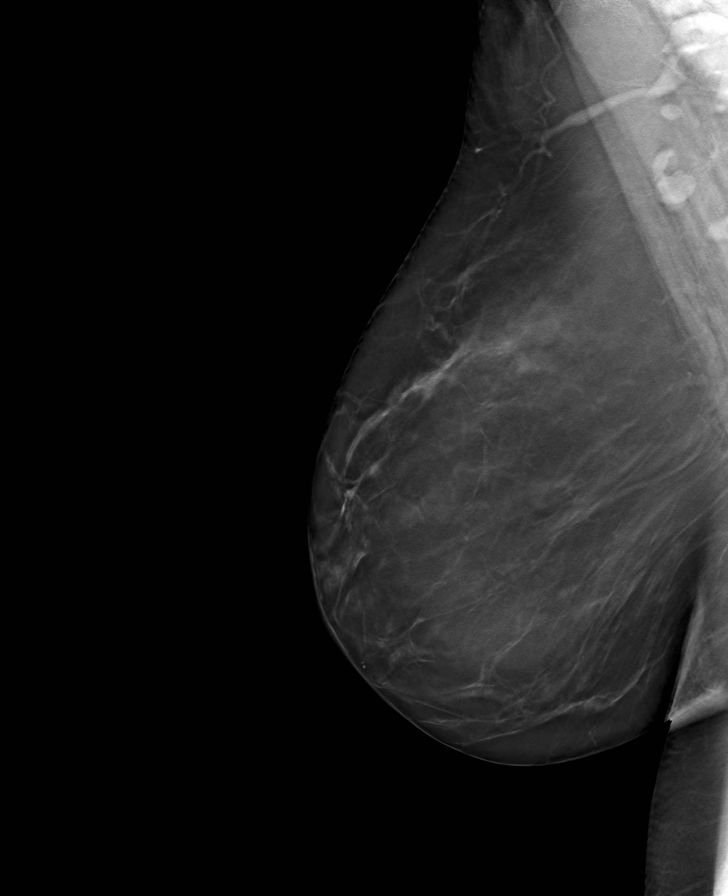

[L MLO tomo · tomo slice 50/99.0]
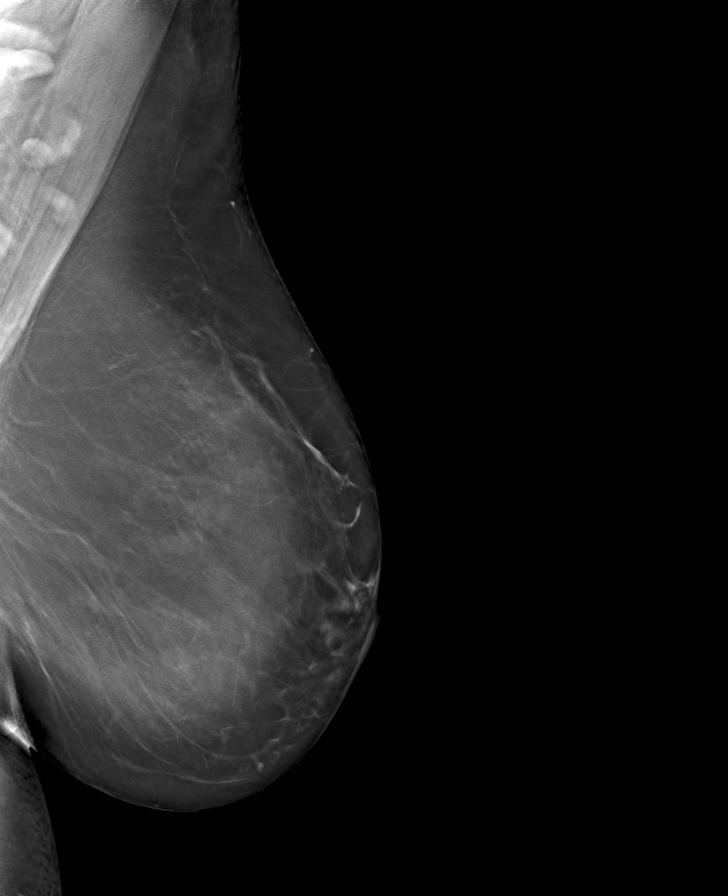

[R CC tomo · tomo slice 38/75.0]
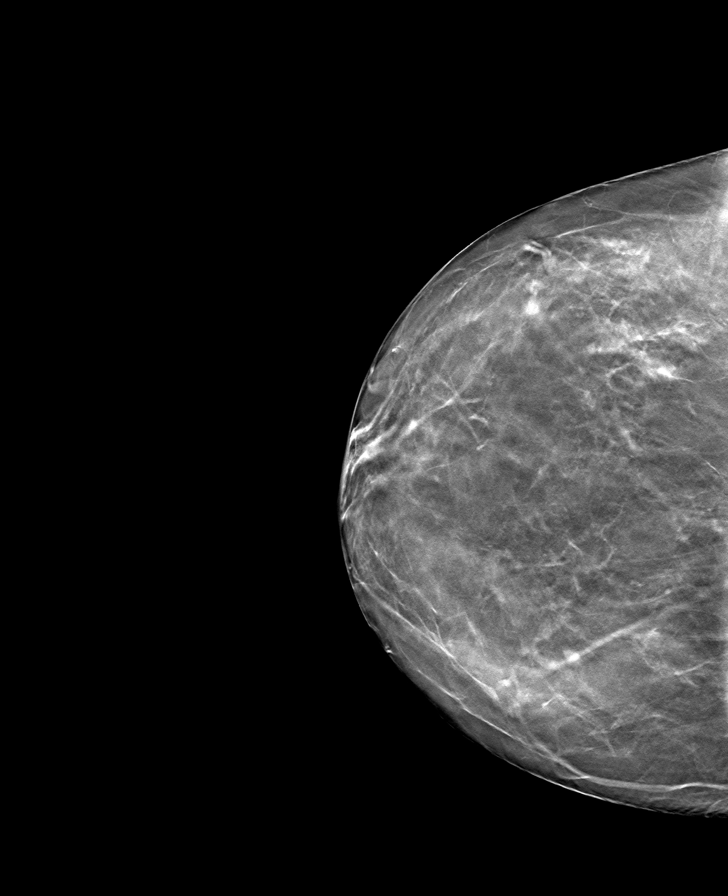

[8 of 24 positions shown; findings below may reference images not displayed]

ACR Breast Density Category b: There are scattered areas of
fibroglandular density.
FINDINGS: There are no findings suspicious for malignancy. Images were
processed with CAD.
IMPRESSION: No mammographic evidence of malignancy. A result letter of this
screening mammogram will be mailed directly to the patient.

RECOMMENDATION:
Screening mammogram in one year. (Code:[id number])

BI-RADS CATEGORY  1: Negative.

## 2020-11-04 DIAGNOSIS — D2239 Melanocytic nevi of other parts of face: Secondary | ICD-10-CM | POA: Diagnosis not present

## 2020-11-12 ENCOUNTER — Ambulatory Visit (INDEPENDENT_AMBULATORY_CARE_PROVIDER_SITE_OTHER): Payer: Medicare Other | Admitting: *Deleted

## 2020-11-12 DIAGNOSIS — Z0000 Encounter for general adult medical examination without abnormal findings: Secondary | ICD-10-CM

## 2020-11-12 NOTE — Patient Instructions (Signed)
[name] , Thank you for taking time to come for your Medicare Wellness Visit. I appreciate your ongoing commitment to your health goals. Please review the following plan we discussed and let me know if I can assist you in the future.   Screening recommendations/referrals: Colonoscopy: up to date Mammogram: up to date Bone Density: up to date Recommended yearly ophthalmology/optometry visit for glaucoma screening and checkup Recommended yearly dental visit for hygiene and checkup  Vaccinations: Influenza vaccine: up to date Pneumococcal vaccine: up to date Tdap vaccine: up to date Shingles vaccine: Education provided  Advanced directives: copy requested  Conditions/risks identified: na  Next appointment: 12-15-2020 @ 11:00 Dr. Raoul Pitch   Preventive Care 3 Years and Older, Female Preventive care refers to lifestyle choices and visits with your health care provider that can promote health and wellness. What does preventive care include? A yearly physical exam. This is also called an annual well check. Dental exams once or twice a year. Routine eye exams. Ask your health care provider how often you should have your eyes checked. Personal lifestyle choices, including: Daily care of your teeth and gums. Regular physical activity. Eating a healthy diet. Avoiding tobacco and drug use. Limiting alcohol use. Practicing safe sex. Taking low-dose aspirin every day. Taking vitamin and mineral supplements as recommended by your health care provider. What happens during an annual well check? The services and screenings done by your health care provider during your annual well check will depend on your age, overall health, lifestyle risk factors, and family history of disease. Counseling  Your health care provider may ask you questions about your: Alcohol use. Tobacco use. Drug use. Emotional well-being. Home and relationship well-being. Sexual activity. Eating habits. History of  falls. Memory and ability to understand (cognition). Work and work Statistician. Reproductive health. Screening  You may have the following tests or measurements: Height, weight, and BMI. Blood pressure. Lipid and cholesterol levels. These may be checked every 5 years, or more frequently if you are over 46 years old. Skin check. Lung cancer screening. You may have this screening every year starting at age 4 if you have a 30-pack-year history of smoking and currently smoke or have quit within the past 15 years. Fecal occult blood test (FOBT) of the stool. You may have this test every year starting at age 29. Flexible sigmoidoscopy or colonoscopy. You may have a sigmoidoscopy every 5 years or a colonoscopy every 10 years starting at age 24. Hepatitis C blood test. Hepatitis B blood test. Sexually transmitted disease (STD) testing. Diabetes screening. This is done by checking your blood sugar (glucose) after you have not eaten for a while (fasting). You may have this done every 1-3 years. Bone density scan. This is done to screen for osteoporosis. You may have this done starting at age 28. Mammogram. This may be done every 1-2 years. Talk to your health care provider about how often you should have regular mammograms. Talk with your health care provider about your test results, treatment options, and if necessary, the need for more tests. Vaccines  Your health care provider may recommend certain vaccines, such as: Influenza vaccine. This is recommended every year. Tetanus, diphtheria, and acellular pertussis (Tdap, Td) vaccine. You may need a Td booster every 10 years. Zoster vaccine. You may need this after age 65. Pneumococcal 13-valent conjugate (PCV13) vaccine. One dose is recommended after age 14. Pneumococcal polysaccharide (PPSV23) vaccine. One dose is recommended after age 36. Talk to your health care provider about  which screenings and vaccines you need and how often you need  them. This information is not intended to replace advice given to you by your health care provider. Make sure you discuss any questions you have with your health care provider. Document Released: 04/18/2015 Document Revised: 12/10/2015 Document Reviewed: 01/21/2015 Elsevier Interactive Patient Education  2017 [employer] Prevention in the Home Falls can cause injuries. They can happen to people of all ages. There are many things you can do to make your home safe and to help prevent falls. What can I do on the outside of my home? Regularly fix the edges of walkways and driveways and fix any cracks. Remove anything that might make you trip as you walk through a door, such as a raised step or threshold. Trim any bushes or trees on the path to your home. Use bright outdoor lighting. Clear any walking paths of anything that might make someone trip, such as rocks or tools. Regularly check to see if handrails are loose or broken. Make sure that both sides of any steps have handrails. Any raised decks and porches should have guardrails on the edges. Have any leaves, snow, or ice cleared regularly. Use sand or salt on walking paths during winter. Clean up any spills in your garage right away. This includes oil or grease spills. What can I do in the bathroom? Use night lights. Install grab bars by the toilet and in the tub and shower. Do not use towel bars as grab bars. Use non-skid mats or decals in the tub or shower. If you need to sit down in the shower, use a plastic, non-slip stool. Keep the floor dry. Clean up any water that spills on the floor as soon as it happens. Remove soap buildup in the tub or shower regularly. Attach bath mats securely with double-sided non-slip rug tape. Do not have throw rugs and other things on the floor that can make you trip. What can I do in the bedroom? Use night lights. Make sure that you have a light by your bed that is easy to reach. Do not use  any sheets or blankets that are too big for your bed. They should not hang down onto the floor. Have a firm chair that has side arms. You can use this for support while you get dressed. Do not have throw rugs and other things on the floor that can make you trip. What can I do in the kitchen? Clean up any spills right away. Avoid walking on wet floors. Keep items that you use a lot in easy-to-reach places. If you need to reach something above you, use a strong step stool that has a grab bar. Keep electrical cords out of the way. Do not use floor polish or wax that makes floors slippery. If you must use wax, use non-skid floor wax. Do not have throw rugs and other things on the floor that can make you trip. What can I do with my stairs? Do not leave any items on the stairs. Make sure that there are handrails on both sides of the stairs and use them. Fix handrails that are broken or loose. Make sure that handrails are as long as the stairways. Check any carpeting to make sure that it is firmly attached to the stairs. Fix any carpet that is loose or worn. Avoid having throw rugs at the top or bottom of the stairs. If you do have throw rugs, attach them to the floor with  carpet tape. Make sure that you have a light switch at the top of the stairs and the bottom of the stairs. If you do not have them, ask someone to add them for you. What else can I do to help prevent falls? Wear shoes that: Do not have high heels. Have rubber bottoms. Are comfortable and fit you well. Are closed at the toe. Do not wear sandals. If you use a stepladder: Make sure that it is fully opened. Do not climb a closed stepladder. Make sure that both sides of the stepladder are locked into place. Ask someone to hold it for you, if possible. Clearly mark and make sure that you can see: Any grab bars or handrails. First and last steps. Where the edge of each step is. Use tools that help you move around (mobility aids)  if they are needed. These include: Canes. Walkers. Scooters. Crutches. Turn on the lights when you go into a dark area. Replace any light bulbs as soon as they burn out. Set up your furniture so you have a clear path. Avoid moving your furniture around. If any of your floors are uneven, fix them. If there are any pets around you, be aware of where they are. Review your medicines with your doctor. Some medicines can make you feel dizzy. This can increase your chance of falling. Ask your doctor what other things that you can do to help prevent falls. This information is not intended to replace advice given to you by your health care provider. Make sure you discuss any questions you have with your health care provider. Document Released: 01/16/2009 Document Revised: 08/28/2015 Document Reviewed: 04/26/2014 Elsevier Interactive Patient Education  2017 [employer].

## 2020-11-12 NOTE — Progress Notes (Signed)
Subjective:   [name] is a 60 y.o. female who presents for Medicare Annual (Subsequent) preventive examination. I connected with  [name] on 11/12/20 by a telephone enabled telemedicine application and verified that I am speaking with the correct person using two identifiers.   I discussed the limitations of evaluation and management by telemedicine. The patient expressed understanding and agreed to proceed.   Review of Systems    na Cardiac Risk Factors include: advanced age (>29mn, >>65women);hypertension     Objective:    Today's Vitals   There is no height or weight on file to calculate BMI.  Advanced Directives 11/12/2020 09/27/2018 09/21/2018  Does Patient Have a Medical Advance Directive? Yes Yes Yes  Type of [place] Directive [place]  Does patient want to make changes to medical advance directive? - No - Patient declined No - Patient declined  Copy of [place] Chart? - No - copy requested No - copy requested    Current Medications (verified) Outpatient Encounter Medications as of 11/12/2020  Medication Sig   ADVAIR DISKUS 100-50 MCG/DOSE AEPB USE 1 INHALATION TWO TIMES A DAY THEN RINSE MOUTH   albuterol (PROVENTIL) (2.5 MG/3ML) 0.083% nebulizer solution Take 3 mLs (2.5 mg total) by nebulization every 6 (six) hours as needed for wheezing or shortness of breath.   albuterol (VENTOLIN HFA) 108 (90 Base) MCG/ACT inhaler Inhale 1-2 puffs into the lungs every 6 (six) hours as needed for shortness of breath.   ALPRAZolam (XANAX) 0.5 MG tablet Take 0.5 mg by mouth 2 (two) times daily as needed for anxiety.   amphetamine-dextroamphetamine (ADDERALL XR) 30 MG 24 hr capsule Take 30 mg by mouth daily.   Cholecalciferol (VITAMIN D-3) 5000 units TABS Take 5,000 Units by mouth daily.   Cyanocobalamin (VITAMIN B-12) 3000 MCG SUBL Place 3,000 mcg under the  tongue daily.   hydrochlorothiazide (HYDRODIURIL) 25 MG tablet Take 1 tablet (25 mg total) by mouth daily.   Magnesium 250 MG TABS Take 250 mg by mouth daily.   OLANZapine-FLUoxetine (SYMBYAX) 3-25 MG capsule Take 1 capsule by mouth every evening.    potassium chloride SA (KLOR-CON) 20 MEQ tablet Take 1 tablet (20 mEq total) by mouth 2 (two) times daily.   vitamin C (ASCORBIC ACID) 500 MG tablet Take 500 mg by mouth daily.   ibuprofen (ADVIL) 200 MG tablet Take by mouth. (Patient not taking: Reported on 11/12/2020)   No facility-administered encounter medications on file as of 11/12/2020.    Allergies (verified) Patient has no known allergies.   History: Past Medical History:  Diagnosis Date   Anxiety    psychiatrist: Dr. CRobina Ade   Arthritis    Colitis    Colon polyps    COPD [place]chronic obstructive [place] disease[place]    chronic bronchitis   Depression    psychiatrist: Dr. CRobina Ade  Elevated liver enzymes    Dr. GRulon Abidefollowing   Fatty liver    GERD (gastroesophageal reflux disease)    Heart murmur    History of chicken pox    History of kidney stones    Renal cell carcinoma    hx of left and right; Dr. BNena Alexander  Past Surgical History:  Procedure Laterality Date   CHOLECYSTECTOMY     FETAL RADIO FREQUENCY ABLATION     FRACTURE SURGERY Left    ankle   JOINT REPLACEMENT Left 2009  knee   NEPHRECTOMY     partial LT   OPEN SURGICAL REPAIR OF GLUTEAL TENDON Left 09/27/2018   Procedure: Left hip bursectomy; gluteal tendon repair;  Surgeon: Gaynelle Arabian, MD;  Location: [facility];  Service: Orthopedics;  Laterality: Left;  69mn   TONSILLECTOMY     Family History  Problem Relation Age of Onset   Hypertension Mother    Alzheimer's disease Mother    Arthritis Mother    Hypertension Father    Alzheimer's disease Father    COPD Father    Heart disease Father    Arthritis Father    Hypertension Sister    Social History   Socioeconomic History   Marital status: Married     Spouse name: Not on file   Number of children: Not on file   Years of education: Not on file   Highest education level: Not on file  Occupational History   Not on file  Tobacco Use   Smoking status: Never   Smokeless tobacco: Never  Vaping Use   Vaping Use: Never used  Substance and Sexual Activity   Alcohol use: No   Drug use: No   Sexual activity: Yes  Other Topics Concern   Not on file  Social History Narrative   Married. 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   Lives with her husband, son and granddaughter.   Drinks caffeinated beverages.   Wears her seatbelt, wears a bicycle helmet, there is a smoke detector in home. There are no firearms in the home.   Patient feels safe in her relationships.   Social Determinants of Health   Financial Resource Strain: Low Risk    Difficulty of Paying Living Expenses: Not hard at all  Food Insecurity: No Food Insecurity   Worried About RCharity fundraiserin the Last Year: Never true   R[place]in the Last Year: Never true  Transportation Needs: No Transportation Needs   Lack of Transportation (Medical): No   Lack of Transportation (Non-Medical): No  Physical Activity: Insufficiently Active   Days of Exercise per Week: 4 days   Minutes of Exercise per Session: 30 min  Stress: No Stress Concern Present   Feeling of Stress : Not at all  Social Connections: Socially Integrated   Frequency of Communication with Friends and Family: More than three times a week   Frequency of Social Gatherings with Friends and Family: More than three times a week   Attends Religious Services: More than 4 times per year   Active Member of [employer] Organizations: Yes   Attends CMusic therapist More than 4 times per year   Marital Status: Married    Tobacco Counseling Counseling given: Not Answered   Clinical Intake:  Pre-visit preparation completed: Yes  Pain : No/denies pain      Nutritional Risks: None Diabetes: No  How often do you need to have someone help you when you read instructions, pamphlets, or other written materials from your doctor or pharmacy?: 1 - Never  Diabetic?No  Interpreter Needed?: No  Information entered by :: JLeroy KennedyLPN   Activities of Daily Living In your present state of health, do you have any difficulty performing the following activities: 11/12/2020  Hearing? N  Vision? N  Difficulty concentrating or making decisions? N  Walking or climbing stairs? N  Dressing or bathing? N  Doing errands, shopping? N  Preparing Food and eating ?  N  Using the Toilet? N  In the past six months, have you accidently leaked urine? N  Do you have problems with loss of bowel control? N  Managing your Medications? N  Managing your Finances? N  Housekeeping or managing your Housekeeping? N  Some recent data might be hidden    Patient Care Team: Ma Hillock, DO as PCP - General (Family Medicine) Gwendel Hanson, MD as Referring Physician (Urology) Aviva Signs, MD as Referring Physician (Gastroenterology) Deneise Lever, MD as Consulting Physician (Pulmonary Disease)  Indicate any recent Medical Services you may have received from other than [facility] providers in the past year (date may be approximate).     Assessment:   This is a routine wellness examination for [facility].  Hearing/Vision screen Hearing Screening - Comments:: No trouble hearing Vision Screening - Comments:: [facility]   Green Up to date  Dietary issues and exercise activities discussed: Current Exercise Habits: Home exercise routine, Type of exercise: walking, Time (Minutes): 30, Frequency (Times/Week): 4, Weekly Exercise (Minutes/Week): 120   Goals Addressed             This Visit's Progress    Patient Stated       Would like to loose 20 lbs       Depression Screen PHQ 2/9 Scores 11/12/2020 06/27/2020 06/27/2019 12/15/2018 06/13/2018  04/08/2017 03/24/2016  PHQ - 2 Score 0 0 0 0 2 0 0  PHQ- 9 Score - 0 0 0 11 - -    Fall Risk Fall Risk  11/12/2020 03/24/2016  Falls in the past year? 0 No  Number falls in past yr: 0 -  Injury with Fall? 0 -  Follow up Falls evaluation completed;Falls prevention discussed -    FALL RISK PREVENTION PERTAINING TO THE HOME:  Any stairs in or around the home? Yes  If so, are there any without handrails? Yes  Home free of loose throw rugs in walkways, pet beds, electrical cords, etc? Yes  Adequate lighting in your home to reduce risk of falls? Yes   ASSISTIVE DEVICES UTILIZED TO PREVENT FALLS:  Life alert? No  Use of a cane, walker or w/c? No  Grab bars in the bathroom? No  Shower chair or bench in shower? No  Elevated toilet seat or a handicapped toilet? Yes   TIMED UP AND GO:  Was the test performed? No .    Cognitive Function:        Immunizations Immunization History  Administered Date(s) Administered   Influenza Split 01/24/2012, 01/06/2013, 01/03/2014, 01/15/2015   Influenza,inj,Quad PF,6+ Mos 12/16/2014, 01/06/2016, 11/25/2016, 12/15/2017, 12/15/2018, 01/07/2020   Influenza-Unspecified 12/16/2014, 01/06/2016, 11/25/2016, 12/15/2017, 12/15/2018, 01/07/2020   PFIZER(Purple Top)SARS-COV-2 Vaccination 06/21/2019, 07/12/2019, 01/15/2020   Pneumococcal Conjugate-13 03/24/2015   Pneumococcal Polysaccharide-23 12/17/2013   Tdap 03/24/2015    TDAP status: Up to date  Flu Vaccine status: Up to date  Pneumococcal vaccine status: Up to date  Covid-19 vaccine status: Completed vaccines  Qualifies for Shingles Vaccine? Yes   Zostavax completed No   Shingrix Completed?: No.    Education has been provided regarding the importance of this vaccine. Patient has been advised to call insurance company to determine out of pocket expense if they have not yet received this vaccine. Advised may also receive vaccine at local pharmacy or Health Dept. Verbalized acceptance and  understanding.  Screening Tests Health Maintenance  Topic Date Due   Zoster Vaccines- Shingrix (1 of 2) Never done   Pneumococcal Vaccine 0-64  Years old (3 - PPSV23 or PCV20) 12/18/2018   DEXA SCAN  05/14/2019   PAP SMEAR-Modifier  03/23/2020   COVID-19 Vaccine (4 - Booster for Pfizer series) 04/16/2020   INFLUENZA VACCINE  11/03/2020   COLONOSCOPY (Pts 45-88yr Insurance coverage will need to be confirmed)  02/23/2021   MAMMOGRAM  05/28/2022   TETANUS/TDAP  03/23/2025   PNEUMOCOCCAL POLYSACCHARIDE VACCINE AGE 13-64 HIGH RISK  Completed   Hepatitis C Screening  Completed   HIV Screening  Completed   HPV VACCINES  Aged Out    Health Maintenance  Health Maintenance Due  Topic Date Due   Zoster Vaccines- Shingrix (1 of 2) Never done   Pneumococcal Vaccine 057665Years old (3 - PPSV23 or PCV20) 12/18/2018   DEXA SCAN  05/14/2019   PAP SMEAR-Modifier  03/23/2020   COVID-19 Vaccine (4 - Booster for Pfizer series) 04/16/2020   INFLUENZA VACCINE  11/03/2020    Colorectal cancer screening: Type of screening: Colonoscopy. Completed  . Repeat every 10 years  Mammogram status: Completed  . Repeat every year  Bone Density scheduled 01-02-2021  Lung Cancer Screening: (Low Dose CT Chest recommended if Age 60-80years, 30 pack-year currently smoking OR have quit w/in 15years.) does not qualify.   Lung Cancer Screening Referral:   Additional Screening:  Hepatitis C Screening: does not qualify; Completed   Vision Screening: Recommended annual ophthalmology exams for early detection of glaucoma and other disorders of the eye. Is the patient up to date with their annual eye exam?  Yes  Who is the provider or what is the name of the office in which the patient attends annual eye exams? [facility] If pt is not established with a provider, would they like to be referred to a provider to establish care? No .   Dental Screening: Recommended annual dental exams for proper oral  hygiene  Community Resource Referral / Chronic Care Management: CRR required this visit?  No   CCM required this visit?  No      Plan:     I have personally reviewed and noted the following in the patient's chart:   Medical and social history Use of alcohol, tobacco or illicit drugs  Current medications and supplements including opioid prescriptions.  Functional ability and status Nutritional status Physical activity Advanced directives List of other physicians Hospitalizations, surgeries, and ER visits in previous 12 months Vitals Screenings to include cognitive, depression, and falls Referrals and appointments  In addition, I have reviewed and discussed with patient certain preventive protocols, quality metrics, and best practice recommendations. A written personalized care plan for preventive services as well as general preventive health recommendations were provided to patient.     JLeroy Kennedy LPN   [id number]  Nurse Notes: na

## 2020-11-28 DIAGNOSIS — M19072 Primary osteoarthritis, left ankle and foot: Secondary | ICD-10-CM | POA: Diagnosis not present

## 2020-11-28 DIAGNOSIS — M79672 Pain in left foot: Secondary | ICD-10-CM | POA: Diagnosis not present

## 2020-12-14 DIAGNOSIS — M79672 Pain in left foot: Secondary | ICD-10-CM | POA: Diagnosis not present

## 2020-12-15 ENCOUNTER — Encounter: Payer: Self-pay | Admitting: Family Medicine

## 2020-12-15 ENCOUNTER — Ambulatory Visit: Payer: Medicare Other | Admitting: Family Medicine

## 2020-12-15 ENCOUNTER — Ambulatory Visit (INDEPENDENT_AMBULATORY_CARE_PROVIDER_SITE_OTHER): Payer: Medicare Other | Admitting: Family Medicine

## 2020-12-15 ENCOUNTER — Other Ambulatory Visit: Payer: Self-pay

## 2020-12-15 VITALS — BP 119/75 | HR 76 | Temp 98.1°F | Ht 62.0 in | Wt 175.2 lb

## 2020-12-15 DIAGNOSIS — Z85528 Personal history of other malignant neoplasm of kidney: Secondary | ICD-10-CM

## 2020-12-15 DIAGNOSIS — M8580 Other specified disorders of bone density and structure, unspecified site: Secondary | ICD-10-CM | POA: Diagnosis not present

## 2020-12-15 DIAGNOSIS — F419 Anxiety disorder, unspecified: Secondary | ICD-10-CM

## 2020-12-15 DIAGNOSIS — I10 Essential (primary) hypertension: Secondary | ICD-10-CM

## 2020-12-15 DIAGNOSIS — E559 Vitamin D deficiency, unspecified: Secondary | ICD-10-CM | POA: Diagnosis not present

## 2020-12-15 DIAGNOSIS — Z23 Encounter for immunization: Secondary | ICD-10-CM | POA: Diagnosis not present

## 2020-12-15 DIAGNOSIS — F32A Depression, unspecified: Secondary | ICD-10-CM | POA: Diagnosis not present

## 2020-12-15 DIAGNOSIS — E876 Hypokalemia: Secondary | ICD-10-CM

## 2020-12-15 DIAGNOSIS — Z6832 Body mass index (BMI) 32.0-32.9, adult: Secondary | ICD-10-CM | POA: Diagnosis not present

## 2020-12-15 DIAGNOSIS — E782 Mixed hyperlipidemia: Secondary | ICD-10-CM | POA: Diagnosis not present

## 2020-12-15 LAB — LDL CHOLESTEROL, DIRECT: Direct LDL: 131 mg/dL

## 2020-12-15 MED ORDER — POTASSIUM CHLORIDE CRYS ER 20 MEQ PO TBCR: 20.0000 meq | EXTENDED_RELEASE_TABLET | Freq: Two times a day (BID) | ORAL | 4 refills | Status: DC

## 2020-12-15 MED ORDER — HYDROCHLOROTHIAZIDE 25 MG PO TABS: 25.0000 mg | ORAL_TABLET | Freq: Every day | ORAL | 1 refills | Status: DC

## 2020-12-15 NOTE — Progress Notes (Signed)
Patient ID: [name], female  DOB: 07-14-1960, 60 y.o.   MRN: [id number] Patient Care Team    Relationship Specialty Notifications Start End  Ma Hillock, DO PCP - General Family Medicine  03/05/15   Gwendel Hanson, MD Referring Physician Urology  04/08/17   Aviva Signs, MD Referring Physician Gastroenterology  04/08/17   Deneise Lever, MD Consulting Physician Pulmonary Disease  04/08/17     Chief Complaint  Patient presents with   Follow-up    6 mo f/u [facility]     Subjective: [name] is a 60 y.o.  Female  present for [facility] follow up Hypertension/hyperlipidemia/morbid obesity- bmi >30/hypok: Pt reports compliance with HCTZ 25 mg QD.  Pt does not take a  daily baby ASA. Pt is not prescribed statin. She did start red yeast rice and psyllium supplements approximately 3 months ago. Diet: Cutting out soda, sugar Exercise: routinely.  RF: HTN, HLD, obesity, FHx HD   H/O RCC:  Pt has a h/o RCC left kidney with partial nephrectomy and RCC right kidney with cryoablation.  Since her last visit she has undergone some more changes with her renal cell carcinoma. She is following with specialties routinely every 6 months for now.   Depression screen [facility] 2/9 11/12/2020 06/27/2020 06/27/2019 12/15/2018 06/13/2018  Decreased Interest 0 0 0 0 1  Down, Depressed, Hopeless 0 0 0 0 1  PHQ - 2 Score 0 0 0 0 2  Altered sleeping - 0 0 0 3  Tired, decreased energy - 0 0 0 2  Change in appetite - 0 0 0 2  Feeling bad or failure about yourself  - 0 0 0 0  Trouble concentrating - 0 0 0 2  Moving slowly or fidgety/restless - 0 0 0 0  Suicidal thoughts - 0 0 0 0  PHQ-9 Score - 0 0 0 11  Difficult doing work/chores - - Not difficult at all Not difficult at all Not difficult at all   GAD 7 : Generalized Anxiety Score 06/27/2019 12/15/2018 06/13/2018  Nervous, Anxious, on Edge 0 0 2  Control/stop worrying 0 0 1  Worry too much - different things 0 0 1  Trouble relaxing  0 0 0  Restless 0 0 0  Easily annoyed or irritable 0 0 2  Afraid - awful might happen 0 0 1  Total GAD 7 Score 0 0 7  Anxiety Difficulty Not difficult at all Not difficult at all Not difficult at all    Immunization History  Administered Date(s) Administered   Influenza Split 01/24/2012, 01/06/2013, 01/03/2014, 01/15/2015   Influenza,inj,Quad PF,6+ Mos 12/16/2014, 01/06/2016, 11/25/2016, 12/15/2017, 12/15/2018, 01/07/2020, 12/15/2020   Influenza-Unspecified 12/16/2014, 01/06/2016, 11/25/2016, 12/15/2017, 12/15/2018, 01/07/2020   PFIZER(Purple Top)SARS-COV-2 Vaccination 06/21/2019, 07/12/2019, 01/15/2020, 08/13/2020   Pneumococcal Conjugate-13 03/24/2015   Pneumococcal Polysaccharide-23 12/17/2013   Tdap 03/24/2015     Past Medical History:  Diagnosis Date   Anxiety    psychiatrist: Dr. Robina Ade    Arthritis    Colitis    Colon polyps    COPD (chronic obstructive pulmonary disease) ([place])    chronic bronchitis   COVID-19 09/2020   Depression    psychiatrist: Dr. Robina Ade   Elevated liver enzymes    Dr. Rulon Abide following   Fatty liver    GERD (gastroesophageal reflux disease)    Heart murmur    History of chicken pox    History of kidney stones    Renal cell carcinoma  hx of left and right; Dr. Nena Alexander   No Known Allergies Past Surgical History:  Procedure Laterality Date   CHOLECYSTECTOMY     FETAL RADIO FREQUENCY ABLATION     FRACTURE SURGERY Left    ankle   JOINT REPLACEMENT Left 2009   knee   NEPHRECTOMY     partial LT   OPEN SURGICAL REPAIR OF GLUTEAL TENDON Left 09/27/2018   Procedure: Left hip bursectomy; gluteal tendon repair;  Surgeon: Gaynelle Arabian, MD;  Location: [facility];  Service: Orthopedics;  Laterality: Left;  82mn   TONSILLECTOMY     Family History  Problem Relation Age of Onset   Hypertension Mother    Alzheimer's disease Mother    Arthritis Mother    Hypertension Father    Alzheimer's disease Father    COPD Father    Heart disease Father     Arthritis Father    Hypertension Sister    Social History   Social History Narrative   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   Lives with her husband, son and granddaughter.   Drinks caffeinated beverages.   Wears her seatbelt, wears a bicycle helmet, there is a smoke detector in home. There are no firearms in the home.   Patient feels safe in her relationships.    Allergies as of 12/15/2020   No Known Allergies      Medication List        Accurate as of December 15, 2020  5:06 PM. If you have any questions, ask your nurse or doctor.          STOP taking these medications    ibuprofen 200 MG tablet Commonly known as: ADVIL Stopped by: RHoward Pouch DO   Vitamin B-12 3000 MCG Subl Stopped by: RHoward Pouch DO       TAKE these medications    Advair Diskus 100-50 MCG/ACT Aepb Generic drug: fluticasone-salmeterol USE 1 INHALATION TWO TIMES A DAY THEN RINSE MOUTH   albuterol (2.5 MG/3ML) 0.083% nebulizer solution Commonly known as: PROVENTIL Take 3 mLs (2.5 mg total) by nebulization every 6 (six) hours as needed for wheezing or shortness of breath.   albuterol 108 (90 Base) MCG/ACT inhaler Commonly known as: VENTOLIN HFA Inhale 1-2 puffs into the lungs every 6 (six) hours as needed for shortness of breath.   ALPRAZolam 0.5 MG tablet Commonly known as: XANAX Take 0.5 mg by mouth 2 (two) times daily as needed for anxiety.   amphetamine-dextroamphetamine 30 MG 24 hr capsule Commonly known as: ADDERALL XR Take 30 mg by mouth daily.   hydrochlorothiazide 25 MG tablet Commonly known as: HYDRODIURIL Take 1 tablet (25 mg total) by mouth daily.   Magnesium 250 MG Tabs Take 250 mg by mouth daily.   OLANZapine-FLUoxetine 3-25 MG capsule Commonly known as: SYMBYAX Take 1 capsule by mouth every evening.   potassium chloride SA 20 MEQ tablet Commonly known as: KLOR-CON Take 1 tablet (20 mEq total) by mouth 2 (two)  times daily.   PSYLLIUM HUSK PO   TURMERIC PO 1,650 mg.   vitamin C 500 MG tablet Commonly known as: ASCORBIC ACID Take 500 mg by mouth daily.   Vitamin D-3 125 MCG (5000 UT) Tabs Take 5,000 Units by mouth daily.        All past medical history, surgical history, allergies, family history, immunizations andmedications were updated in the EMR today and reviewed under the history and medication portions of their EMR.  ROS: 14 pt review of systems performed and negative (unless mentioned in an HPI)  Objective: BP 119/75   Pulse 76   Temp 98.1 F (36.7 C) (Temporal)   Ht '5\' 2"'$  (1.575 m)   Wt 175 lb 3.2 oz (79.5 kg)   LMP 04/22/2013   SpO2 99%   BMI 32.04 kg/m  Gen: Afebrile. No acute distress. Pleasant obese female.  HENT: AT. [address].  Eyes:Pupils Equal Round Reactive to light, Extraocular movements intact,  Conjunctiva without redness, discharge or icterus. CV: RRR no murmur, no edema Chest: CTAB, no wheeze or crackles Skin: no rashes, purpura or petechiae.  Neuro:  Normal gait. PERLA. EOMi. Alert. Oriented x3 Psych: Normal affect, dress and demeanor. Normal speech. Normal thought content and judgment.   No results found.  Assessment/plan: [name] is a 59 y.o. female present for  Anxiety and depression Follows w/ psych  Essential hypertension, benign/Hyperlipidemia, unspecified hyperlipidemia type/Morbid obesity (HCC)/hypokalemia Stable.  - has gained back some of the weight she lost. Encouraged her to continue diet and exercise. - continue  HCTZ 25 mg - continue   Kdur BID . - Goal LDL 100-130.  She will think about statin use if not at goal.  We also discussed Zetia as a potential.  Lengthy discussion today surrounding her concern of statins and her renal function. -For now continue red yeast rice and psyllium supplements.  We will recheck LDL since she has been on the supplements for 3 months now. LDL collected today -Other labs UTD  06/2020 Follow-up 5.5 months for chronic conditions   Vitamin D deficiency - vit d collectedUTD 06/2020 (63) -DEXA UTD  05/2016> she has scheduled rpt 01/02/2021  Influenza vaccine provided today. Return in about 25 weeks (around 06/08/2021) for [place] (30 min).  Orders Placed This Encounter  Procedures   Flu Vaccine QUAD 6+ mos PF IM (Fluarix Quad PF)   Direct LDL    Meds ordered this encounter  Medications   hydrochlorothiazide (HYDRODIURIL) 25 MG tablet    Sig: Take 1 tablet (25 mg total) by mouth daily.    Dispense:  90 tablet    Refill:  1   potassium chloride SA (KLOR-CON) 20 MEQ tablet    Sig: Take 1 tablet (20 mEq total) by mouth 2 (two) times daily.    Dispense:  180 tablet    Refill:  4    Referral Orders  No referral(s) requested today     Electronically signed by: Howard Pouch, [place]

## 2020-12-15 NOTE — Patient Instructions (Addendum)
Great to see you today.  I have refilled the medication(s) we provide.   If labs were collected, we will inform you of lab results once received either by echart message or telephone call.   - echart message- for normal results that have been seen by the patient already.   - telephone call: abnormal results or if patient has not viewed results in their echart.  Next appt 5.5 mos   Preventing High Cholesterol Cholesterol is a white, waxy substance similar to fat that the human body needs to help build cells. The liver makes all the cholesterol that a person's body needs. Having high cholesterol (hypercholesterolemia) increases your risk for heart disease and stroke. Extra or excess cholesterol comes from the food that you eat. High cholesterol can often be prevented with diet and lifestyle changes. If you already have high cholesterol, you can control it with diet, lifestyle changes, and medicines. How can high cholesterol affect me? If you have high cholesterol, fatty deposits (plaques) may build up on the walls of your blood vessels. The blood vessels that carry blood away from your heart are called arteries. Plaques make the arteries narrower and stiffer. This in turn can: Restrict or block blood flow and cause blood clots to form. Increase your risk for heart attack and stroke. What can increase my risk for high cholesterol? This condition is more likely to develop in people who: Eat foods that are high in saturated fat or cholesterol. Saturated fat is mostly found in foods that come from animal sources. Are overweight. Are not getting enough exercise. Use products that contain nicotine or tobacco, such as cigarettes, e-cigarettes, and chewing tobacco. Have a family history of high cholesterol (familial hypercholesterolemia). What actions can I take to prevent this? Nutrition  Eat less saturated fat. Avoid trans fats (partially hydrogenated oils). These are often found in margarine and  in some baked goods, fried foods, and snacks bought in packages. Avoid precooked or cured meat, such as bacon, sausages, or meat loaves. Avoid foods and drinks that have added sugars. Eat more fruits, vegetables, and whole grains. Choose healthy sources of protein, such as fish, poultry, lean cuts of red meat, beans, peas, lentils, and nuts. Choose healthy sources of fat, such as: Nuts. Vegetable oils, especially olive oil. Fish that have healthy fats, such as omega-3 fatty acids. These fish include mackerel or salmon. Lifestyle Lose weight if you are overweight. Maintaining a healthy body mass index (BMI) can help prevent or control high cholesterol. It can also lower your risk for diabetes and high blood pressure. Ask your health care provider to help you with a diet and exercise plan to lose weight safely. Do not use any products that contain nicotine or tobacco. These products include cigarettes, chewing tobacco, and vaping devices, such as e-cigarettes. If you need help quitting, ask your health care provider. Alcohol use Do not drink alcohol if: Your health care provider tells you not to drink. You are pregnant, may be pregnant, or are planning to become pregnant. If you drink alcohol: Limit how much you have to: 0-1 drink a day for women. 0-2 drinks a day for men. Know how much alcohol is in your drink. In the [place] one drink equals one 12 oz bottle of beer (355 mL), one 5 oz glass of wine (148 mL), or one 1 oz glass of hard liquor (44 mL). Activity  Get enough exercise. Do exercises as told by your health care provider. Each week, do at least  150 minutes of exercise that takes a medium level of effort (moderate-intensity exercise). This kind of exercise: Makes your heart beat faster while allowing you to still be able to talk. Can be done in short sessions several times a day or longer sessions a few times a week. For example, on 5 days each week, you could walk fast or ride your  bike 3 times a day for 10 minutes each time. Medicines Your health care provider may recommend medicines to help lower cholesterol. This may be a medicine to lower the amount of cholesterol that your liver makes. You may need medicine if: Diet and lifestyle changes have not lowered your cholesterol enough. You have high cholesterol and other risk factors for heart disease or stroke. Take over-the-counter and prescription medicines only as told by your health care provider. General information Manage your risk factors for high cholesterol. Talk with your health care provider about all your risk factors and how to lower your risk. Manage other conditions that you have, such as diabetes or high blood pressure (hypertension). Have blood tests to check your cholesterol levels at regular points in time as told by your health care provider. Keep all follow-up visits. This is important. Where to find more information American Heart Association: www.heart.org National Heart, Lung, and Blood Institute: https://wilson-eaton.com/ Summary High cholesterol increases your risk for heart disease and stroke. By keeping your cholesterol level low, you can reduce your risk for these conditions. High cholesterol can often be prevented with diet and lifestyle changes. Work with your health care provider to manage your risk factors, and have your blood tested regularly. This information is not intended to replace advice given to you by your health care provider. Make sure you discuss any questions you have with your health care provider. Document Revised: 05/26/2020 Document Reviewed: 05/26/2020 Elsevier Patient Education  2022 [employer].

## 2020-12-25 DIAGNOSIS — M79672 Pain in left foot: Secondary | ICD-10-CM | POA: Diagnosis not present

## 2020-12-30 DIAGNOSIS — Z23 Encounter for immunization: Secondary | ICD-10-CM | POA: Diagnosis not present

## 2021-01-02 ENCOUNTER — Other Ambulatory Visit: Payer: Medicare Other

## 2021-03-09 DIAGNOSIS — C641 Malignant neoplasm of right kidney, except renal pelvis: Secondary | ICD-10-CM | POA: Diagnosis not present

## 2021-03-09 DIAGNOSIS — Z85528 Personal history of other malignant neoplasm of kidney: Secondary | ICD-10-CM | POA: Diagnosis not present

## 2021-03-10 ENCOUNTER — Other Ambulatory Visit: Payer: Self-pay | Admitting: Family Medicine

## 2021-03-11 DIAGNOSIS — C641 Malignant neoplasm of right kidney, except renal pelvis: Secondary | ICD-10-CM | POA: Diagnosis not present

## 2021-04-14 ENCOUNTER — Ambulatory Visit: Payer: Medicare Other | Admitting: Internal Medicine

## 2021-04-16 ENCOUNTER — Other Ambulatory Visit: Payer: Self-pay | Admitting: Family Medicine

## 2021-04-16 DIAGNOSIS — M8580 Other specified disorders of bone density and structure, unspecified site: Secondary | ICD-10-CM

## 2021-04-16 DIAGNOSIS — E559 Vitamin D deficiency, unspecified: Secondary | ICD-10-CM

## 2021-04-16 DIAGNOSIS — E2839 Other primary ovarian failure: Secondary | ICD-10-CM

## 2021-04-22 ENCOUNTER — Other Ambulatory Visit: Payer: Self-pay | Admitting: Internal Medicine

## 2021-05-06 DIAGNOSIS — M25561 Pain in right knee: Secondary | ICD-10-CM | POA: Diagnosis not present

## 2021-05-06 DIAGNOSIS — M1711 Unilateral primary osteoarthritis, right knee: Secondary | ICD-10-CM | POA: Diagnosis not present

## 2021-05-07 ENCOUNTER — Other Ambulatory Visit: Payer: Medicare Other

## 2021-05-08 ENCOUNTER — Telehealth: Payer: Self-pay

## 2021-05-08 NOTE — Telephone Encounter (Signed)
Surgical clearance forms received on 05/08/21. Patient has been scheduled on n/a to surgical clearance appt. Forms have been placed on PCP for further appt discussion at upcoming appt. Pt understands that she will need an appt for surgical clearance.

## 2021-05-11 ENCOUNTER — Telehealth: Payer: Self-pay | Admitting: Internal Medicine

## 2021-05-11 NOTE — Telephone Encounter (Signed)
Fax received from Dr. Gaynelle Arabian with [facility] to perform a Right total knee arthroplasty with spinal anesthesia on patient.  Patient needs surgery clearance. Patient was seen on 05/12/2021. Office protocol is a risk assessment can be sent to surgeon if patient has been seen in 60 days or less.   Sending to Dr. Annamaria Boots for risk assessment or recommendations if patient needs to be seen in office prior to surgical procedure.

## 2021-05-11 NOTE — Progress Notes (Signed)
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 MAIC, GERD, complicated by history renal cell CA PFT- 2008-  CT chest [facility] 12/13/13- Ground glass, tree-in-bud, and micronodular opacities in a bronchovascular pattern involving the right upper lobe are favored to be related to an infectious process. Recommend repeat chest CT after appropriate therapy to document resolution. CT chest 03/15/16 No evidence of interstitial lung disease or mycobacterium aviumcomplex. Scattered pulmonary nodules. 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 MAIC, GERD/ LPR, complicated by history renal cell CA, HTN,  Advair 100, Ventolin hfa, neb albuterol,  Covid vax- 3 Phizer Flu vax- had Reports breathing has been "great'. Rarely needing rescue inhaler, using Advair 1x/ daily. Credits Covid mask and precautions for limiting exposure to infections.  Reports clear CXR at [facility] in June for f/u of renal cancer, with no recurrence.   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 MAIC, GERD/ LPR, complicated by history renal cell CA, HTN,  -Advair 100, Ventolin hfa, neb albuterol,  Covid vax- 3 Phizer Flu vax- had CXR [facility] 03/09/21-unchanged cardiomediastinal silhouette.  Persistent mild elevation right hemidiaphragm with associated right basilar atelectasis.  No significant pleural effusion.  No pneumothorax.  Ill-defined lucency appears similar to prior, of the lower thoracic vertebrae, favored radiographic overlap of structures.  No reported changes compared with CXR 03/05/2020. Preop for TKR/ Dr Wynelle Link -----Patient is here for surgical clearance, feels like her breathing is good. Had CXR at [facility] and  wants to discuss results She denies cough wheeze or other respiratory symptoms and feels well now.  She had had COVID infection twice in June and October 2022 with no specific treatment.  We discussed possible use of an antiviral if she were to get COVID infection again.  She has no recognized residual from her first infections. No apparent infection or acute problem or instability that would represent increased pulmonary risk associated with anticipated knee replacement surgery. She is clear from a pulmonary standpoint to proceed with surgical plans.  Consultation will be available if needed.   ROS-see HPI   + = positive Constitutional:   No-   weight loss, night sweats, fevers, chills, fatigue, lassitude. HEENT:   +headaches, difficulty swallowing, tooth/dental problems, sore throat,       No-  sneezing, itching,  ear ache, nasal congestion, post nasal drip,  CV:  No-  chest pain, orthopnea, PND, swelling in lower extremities, anasarca, dizziness, palpitations Resp: +shortness of breath with exertion or at rest.             No-productive cough,   non-productive cough,  No- coughing up of blood.              -change in color of mucus. No- wheezing.   Skin: No-   rash or lesions. GI:  + heartburn, indigestion, No-abdominal pain, nausea, vomiting,  GU: . MS:  +joint pain or swelling.   Neuro-     nothing unusual Psych:  No- change in mood or affect. No depression or anxiety.  No memory loss.  OBJ- Physical Exam General- Alert, Oriented, Affect-appropriate, Distress- none acute. + Overweight. Skin- rash-none, lesions- none, excoriation- none Lymphadenopathy- none Head- atraumatic            Eyes- Gross vision intact, PERRLA, conjunctivae and secretions clear  Ears- clear            Nose- no- turbinate edema, no-Septal dev, mucus, polyps, erosion, perforation,            Throat- Mallampati II , mucosa-red , drainage+, tonsils- atrophic,   Neck- flexible , trachea midline, no  stridor , thyroid nl, carotid no bruit Chest - symmetrical excursion , unlabored           Heart/CV- RRR , no murmur , no gallop  , no rub, nl s1 s2                           - JVD- none , edema- none, stasis changes- none, varices- none           Lung- clear unlabored, wheeze- none, cough- none, dullness-none, rub- none           Chest wall-  Abd-  Br/ Gen/ Rectal- Not done, not indicated Extrem- cyanosis- none, clubbing, none, atrophy- none, strength- nl Neuro- grossly intact to observation

## 2021-05-12 ENCOUNTER — Encounter: Payer: Self-pay | Admitting: Internal Medicine

## 2021-05-12 ENCOUNTER — Other Ambulatory Visit: Payer: Self-pay

## 2021-05-12 ENCOUNTER — Ambulatory Visit (INDEPENDENT_AMBULATORY_CARE_PROVIDER_SITE_OTHER): Payer: Medicare Other | Admitting: Internal Medicine

## 2021-05-12 DIAGNOSIS — U071 COVID-19: Secondary | ICD-10-CM | POA: Diagnosis not present

## 2021-05-12 DIAGNOSIS — J449 Chronic obstructive pulmonary disease, unspecified: Secondary | ICD-10-CM

## 2021-05-12 NOTE — Assessment & Plan Note (Signed)
She reports confirmed infections in June and October 2022 with no antiviral therapy.  She does not recognize any residual at this time. Plan-if she were to get another COVID infection I would be inclined to recommend an antiviral such as molnupiravir in hopes of suppressing risk for long COVID.

## 2021-05-12 NOTE — Assessment & Plan Note (Signed)
Mild intermittent uncomplicated.  Well-controlled. Plan-continue use of Advair, Ventolin rescue inhaler and nebulizer if needed.

## 2021-05-12 NOTE — Patient Instructions (Signed)
I don't see any increased pulmonary risk for planned knee replacement surgery  Please call if we can help

## 2021-05-14 NOTE — Telephone Encounter (Signed)
OV notes and clearance form has been faxed back to [facility]. Nothing further needed at this time.

## 2021-06-04 ENCOUNTER — Other Ambulatory Visit: Payer: Self-pay

## 2021-06-05 ENCOUNTER — Encounter: Payer: Self-pay | Admitting: Family Medicine

## 2021-06-05 ENCOUNTER — Ambulatory Visit (INDEPENDENT_AMBULATORY_CARE_PROVIDER_SITE_OTHER): Payer: Medicare Other | Admitting: Family Medicine

## 2021-06-05 VITALS — BP 133/73 | HR 80 | Temp 98.6°F | Ht 62.0 in | Wt 186.0 lb

## 2021-06-05 DIAGNOSIS — M8580 Other specified disorders of bone density and structure, unspecified site: Secondary | ICD-10-CM | POA: Diagnosis not present

## 2021-06-05 DIAGNOSIS — K760 Fatty (change of) liver, not elsewhere classified: Secondary | ICD-10-CM | POA: Diagnosis not present

## 2021-06-05 DIAGNOSIS — F32A Depression, unspecified: Secondary | ICD-10-CM

## 2021-06-05 DIAGNOSIS — Z85528 Personal history of other malignant neoplasm of kidney: Secondary | ICD-10-CM

## 2021-06-05 DIAGNOSIS — Z6832 Body mass index (BMI) 32.0-32.9, adult: Secondary | ICD-10-CM

## 2021-06-05 DIAGNOSIS — R7309 Other abnormal glucose: Secondary | ICD-10-CM

## 2021-06-05 DIAGNOSIS — E782 Mixed hyperlipidemia: Secondary | ICD-10-CM | POA: Diagnosis not present

## 2021-06-05 DIAGNOSIS — I10 Essential (primary) hypertension: Secondary | ICD-10-CM | POA: Diagnosis not present

## 2021-06-05 DIAGNOSIS — F419 Anxiety disorder, unspecified: Secondary | ICD-10-CM

## 2021-06-05 DIAGNOSIS — E559 Vitamin D deficiency, unspecified: Secondary | ICD-10-CM | POA: Diagnosis not present

## 2021-06-05 LAB — LIPID PANEL
Cholesterol: 212 mg/dL — ABNORMAL HIGH (ref 0–200)
HDL: 63.1 mg/dL (ref >39.00)
LDL Cholesterol: 129 mg/dL — ABNORMAL HIGH (ref 0–99)
NonHDL: 148.89
Total CHOL/HDL Ratio: 3
Triglycerides: 100 mg/dL (ref 0.0–149.0)
VLDL: 20 mg/dL (ref 0.0–40.0)

## 2021-06-05 LAB — COMPREHENSIVE METABOLIC PANEL
ALT: 41 U/L — ABNORMAL HIGH (ref 0–35)
AST: 27 U/L (ref 0–37)
Albumin: 4.1 g/dL (ref 3.5–5.2)
Alkaline Phosphatase: 110 U/L (ref 39–117)
BUN: 19 mg/dL (ref 6–23)
CO2: 28 mEq/L (ref 19–32)
Calcium: 9.8 mg/dL (ref 8.4–10.5)
Chloride: 98 mEq/L (ref 96–112)
Creatinine, Ser: 0.8 mg/dL (ref 0.40–1.20)
GFR: 79.96 mL/min (ref >60.00)
Glucose, Bld: 88 mg/dL (ref 70–99)
Potassium: 3.6 mEq/L (ref 3.5–5.1)
Sodium: 135 mEq/L (ref 135–145)
Total Bilirubin: 0.6 mg/dL (ref 0.2–1.2)
Total Protein: 7.3 g/dL (ref 6.0–8.3)

## 2021-06-05 LAB — CBC WITH DIFFERENTIAL/PLATELET
Basophils Absolute: 0 10*3/uL (ref 0.0–0.1)
Basophils Relative: 0.4 % (ref 0.0–3.0)
Eosinophils Absolute: 0.5 10*3/uL (ref 0.0–0.7)
Eosinophils Relative: 7.4 % — ABNORMAL HIGH (ref 0.0–5.0)
HCT: 39.1 % (ref 36.0–46.0)
Hemoglobin: 13.1 g/dL (ref 12.0–15.0)
Lymphocytes Relative: 43.2 % (ref 12.0–46.0)
Lymphs Abs: 2.8 10*3/uL (ref 0.7–4.0)
MCHC: 33.5 g/dL (ref 30.0–36.0)
MCV: 89 fl (ref 78.0–100.0)
Monocytes Absolute: 0.5 10*3/uL (ref 0.1–1.0)
Monocytes Relative: 7.8 % (ref 3.0–12.0)
Neutro Abs: 2.7 10*3/uL (ref 1.4–7.7)
Neutrophils Relative %: 41.2 % — ABNORMAL LOW (ref 43.0–77.0)
Platelets: 363 10*3/uL (ref 150.0–400.0)
RBC: 4.39 Mil/uL (ref 3.87–5.11)
RDW: 15 % (ref 11.5–15.5)
WBC: 6.5 10*3/uL (ref 4.0–10.5)

## 2021-06-05 LAB — VITAMIN D 25 HYDROXY (VIT D DEFICIENCY, FRACTURES): VITD: 57.07 ng/mL (ref 30.00–100.00)

## 2021-06-05 LAB — TSH: TSH: 1.95 u[IU]/mL (ref 0.35–5.50)

## 2021-06-05 LAB — HEMOGLOBIN A1C: Hgb A1c MFr Bld: 5.7 % (ref 4.6–6.5)

## 2021-06-05 MED ORDER — HYDROCHLOROTHIAZIDE 25 MG PO TABS: 25.0000 mg | ORAL_TABLET | Freq: Every day | ORAL | 1 refills | Status: DC

## 2021-06-05 MED ORDER — POTASSIUM CHLORIDE CRYS ER 20 MEQ PO TBCR
20.0000 meq | EXTENDED_RELEASE_TABLET | Freq: Two times a day (BID) | ORAL | 4 refills | Status: DC
Start: 2021-06-05 — End: 2021-11-20

## 2021-06-05 NOTE — Progress Notes (Signed)
Patient ID: [name], female  DOB: 04/01/1961, 61 y.o.   MRN: [id number] Patient Care Team    Relationship Specialty Notifications Start End  Ma Hillock, DO PCP - General Family Medicine  03/05/15   Gwendel Hanson, MD Referring Physician Urology  04/08/17   Aviva Signs, MD Referring Physician Gastroenterology  04/08/17   Deneise Lever, MD Consulting Physician Pulmonary Disease  04/08/17   Brien Few, MD Consulting Physician Obstetrics and Gynecology  06/05/21     Chief Complaint  Patient presents with   Hypertension    Cmc; pt is not fasting    Subjective: [name] is a 61 y.o.  Female  present for [facility] follow up Hypertension/hyperlipidemia/morbid obesity- bmi >30/hypok: Pt reports compliance with HCTZ 25 mg QD. Patient denies chest pain, shortness of breath, dizziness or lower extremity edema.  Pt does not take a  daily baby ASA. Pt is not prescribed statin. She did start red yeast rice and psyllium supplements. Diet: Cutting out soda, sugar Exercise: routinely.  RF: HTN, HLD, obesity, FHx HD   H/O RCC:  Pt has a h/o RCC left kidney with partial nephrectomy and RCC right kidney with cryoablation.  Since her last visit she has undergone some more changes with her renal cell carcinoma. She is following with specialties routinely every 6 months for now.   Depression screen [facility] 2/9 11/12/2020 06/27/2020 06/27/2019 12/15/2018 06/13/2018  Decreased Interest 0 0 0 0 1  Down, Depressed, Hopeless 0 0 0 0 1  PHQ - 2 Score 0 0 0 0 2  Altered sleeping - 0 0 0 3  Tired, decreased energy - 0 0 0 2  Change in appetite - 0 0 0 2  Feeling bad or failure about yourself  - 0 0 0 0  Trouble concentrating - 0 0 0 2  Moving slowly or fidgety/restless - 0 0 0 0  Suicidal thoughts - 0 0 0 0  PHQ-9 Score - 0 0 0 11  Difficult doing work/chores - - Not difficult at all Not difficult at all Not difficult at all   GAD 7 : Generalized Anxiety Score 06/27/2019  12/15/2018 06/13/2018  Nervous, Anxious, on Edge 0 0 2  Control/stop worrying 0 0 1  Worry too much - different things 0 0 1  Trouble relaxing 0 0 0  Restless 0 0 0  Easily annoyed or irritable 0 0 2  Afraid - awful might happen 0 0 1  Total GAD 7 Score 0 0 7  Anxiety Difficulty Not difficult at all Not difficult at all Not difficult at all    Immunization History  Administered Date(s) Administered   Influenza Split 01/24/2012, 01/06/2013, 01/03/2014, 12/16/2014, 01/15/2015   Influenza,inj,Quad PF,6+ Mos 12/16/2014, 01/06/2016, 11/25/2016, 12/15/2017, 12/15/2018, 01/07/2020, 12/15/2020   Influenza-Unspecified 12/16/2014, 01/06/2016, 11/25/2016, 12/15/2017, 12/15/2018, 01/07/2020   PFIZER(Purple Top)SARS-COV-2 Vaccination 06/21/2019, 07/12/2019, 01/15/2020, 08/26/2020, 12/30/2020   Pneumococcal Conjugate-13 03/24/2015   Pneumococcal Polysaccharide-23 12/17/2013   Td 09/03/2020   Tdap 03/24/2015     Past Medical History:  Diagnosis Date   Anxiety    psychiatrist: Dr. Robina Ade    Arthritis    Colitis    Colon polyps    COPD (chronic obstructive pulmonary disease) ([place])    chronic bronchitis   COVID-19 09/2020   Depression    psychiatrist: Dr. Robina Ade   Elevated liver enzymes    Dr. Rulon Abide following   Fatty liver    GERD (gastroesophageal reflux  disease)    Heart murmur    History of chicken pox    History of kidney stones    Primary osteoarthritis of both knees 04/09/2011   Formatting of this note might be different from the original. Overview:  S/p left total knee replacement   Renal cell carcinoma    hx of left and right; Dr. Nena Alexander   No Known Allergies Past Surgical History:  Procedure Laterality Date   CHOLECYSTECTOMY     FETAL RADIO FREQUENCY ABLATION     FRACTURE SURGERY Left    ankle   JOINT REPLACEMENT Left 2009   knee   NEPHRECTOMY     partial LT   OPEN SURGICAL REPAIR OF GLUTEAL TENDON Left 09/27/2018   Procedure: Left hip bursectomy; gluteal tendon repair;   Surgeon: Gaynelle Arabian, MD;  Location: [facility];  Service: Orthopedics;  Laterality: Left;  29mn   TONSILLECTOMY     Family History  Problem Relation Age of Onset   Hypertension Mother    Alzheimer's disease Mother    Arthritis Mother    Hypertension Father    Alzheimer's disease Father    COPD Father    Heart disease Father    Arthritis Father    Hypertension Sister    Social History   Social History Narrative   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   Lives with her husband, son and granddaughter.   Drinks caffeinated beverages.   Wears her seatbelt, wears a bicycle helmet, there is a smoke detector in home. There are no firearms in the home.   Patient feels safe in her relationships.    Allergies as of 06/05/2021   No Known Allergies      Medication List        Accurate as of June 05, 2021 11:16 AM. If you have any questions, ask your nurse or doctor.          STOP taking these medications    OLANZapine-FLUoxetine 3-25 MG capsule Commonly known as: SYMBYAX Stopped by: RHoward Pouch DO       TAKE these medications    Advair Diskus 100-50 MCG/ACT Aepb Generic drug: fluticasone-salmeterol USE 1 INHALATION TWO TIMES A DAY THEN RINSE MOUTH   albuterol (2.5 MG/3ML) 0.083% nebulizer solution Commonly known as: PROVENTIL Take 3 mLs (2.5 mg total) by nebulization every 6 (six) hours as needed for wheezing or shortness of breath.   albuterol 108 (90 Base) MCG/ACT inhaler Commonly known as: VENTOLIN HFA Inhale 1-2 puffs into the lungs every 6 (six) hours as needed for shortness of breath.   ALPRAZolam 0.5 MG tablet Commonly known as: XANAX Take 0.5 mg by mouth 2 (two) times daily as needed for anxiety.   amphetamine-dextroamphetamine 30 MG 24 hr capsule Commonly known as: ADDERALL XR Take 30 mg by mouth daily.   FLUoxetine 20 MG capsule Commonly known as: PROZAC Take 20 mg by mouth daily.    hydrochlorothiazide 25 MG tablet Commonly known as: HYDRODIURIL Take 1 tablet (25 mg total) by mouth daily.   Lybalvi 5-10 MG Tabs Generic drug: OLANZapine-Samidorphan Take by mouth.   Magnesium 250 MG Tabs Take 250 mg by mouth daily.   potassium chloride SA 20 MEQ tablet Commonly known as: KLOR-CON M Take 1 tablet (20 mEq total) by mouth 2 (two) times daily.   PSYLLIUM HUSK PO   RED YEAST RICE PO   TURMERIC PO 1,650 mg.   vitamin C 500 MG tablet  Commonly known as: ASCORBIC ACID Take 500 mg by mouth daily.   Vitamin D-3 125 MCG (5000 UT) Tabs Take 5,000 Units by mouth daily.        All past medical history, surgical history, allergies, family history, immunizations andmedications were updated in the EMR today and reviewed under the history and medication portions of their EMR.      ROS: 14 pt review of systems performed and negative (unless mentioned in an HPI)  Objective: BP 133/73    Pulse 80    Temp 98.6 F (37 C) (Oral)    Ht 5' 2" (1.575 m)    Wt 186 lb (84.4 kg)    LMP 04/22/2013    SpO2 98%    BMI 34.02 kg/m  Physical Exam Vitals and nursing note reviewed.  Constitutional:      General: She is not in acute distress.    Appearance: Normal appearance. She is not ill-appearing, toxic-appearing or diaphoretic.  HENT:     Head: Normocephalic and atraumatic.  Eyes:     General: No scleral icterus.       Right eye: No discharge.        Left eye: No discharge.     Extraocular Movements: Extraocular movements intact.     Conjunctiva/sclera: Conjunctivae normal.     Pupils: Pupils are equal, round, and reactive to light.  Cardiovascular:     Rate and Rhythm: Normal rate and regular rhythm.     Heart sounds: No murmur heard. Pulmonary:     Effort: Pulmonary effort is normal. No respiratory distress.     Breath sounds: Normal breath sounds. No wheezing, rhonchi or rales.  Musculoskeletal:     Cervical back: Neck supple. No tenderness.     Right lower leg:  No edema.     Left lower leg: No edema.  Lymphadenopathy:     Cervical: No cervical adenopathy.  Skin:    General: Skin is warm and dry.     Coloration: Skin is not jaundiced or pale.     Findings: No erythema or rash.  Neurological:     Mental Status: She is alert and oriented to person, place, and time. Mental status is at baseline.     Motor: No weakness.     Gait: Gait normal.  Psychiatric:        Mood and Affect: Mood normal.        Behavior: Behavior normal.        Thought Content: Thought content normal.        Judgment: Judgment normal.    No results found.  Assessment/plan: [name] is a 61 y.o. female present for  Anxiety and depression Follows w/ psych  Essential hypertension, benign/Hyperlipidemia, unspecified hyperlipidemia type/Morbid obesity (HCC)/hypokalemia Stable.  - has gained back some of the weight she lost. Encouraged her to continue diet and exercise. - continue  HCTZ 25 mg - continue  Kdur BID . - Goal LDL 100-130.  She will think about statin use if not at goal.  We also discussed Zetia as a potential.  Lengthy discussion today surrounding her concern of statins and her renal function. -For now continue red yeast rice and psyllium supplements.   Cbc, cmp, tsh , lipids collected today Follow-up 5.5 months for chronic conditions  Vitamin D deficiency - vit d collected today -DEXA UTD  05/2016> she has scheduled rpt 01/02/2021   Return in about 24 weeks (around 11/20/2021) for [facility] (30 min).   Orders Placed  This Encounter  Procedures   TSH   Hemoglobin A1c   Lipid panel   Vitamin D (25 hydroxy)   CBC w/Diff   Comp Met (CMET)    Meds ordered this encounter  Medications   hydrochlorothiazide (HYDRODIURIL) 25 MG tablet    Sig: Take 1 tablet (25 mg total) by mouth daily.    Dispense:  90 tablet    Refill:  1   potassium chloride SA (KLOR-CON M) 20 MEQ tablet    Sig: Take 1 tablet (20 mEq total) by mouth 2 (two) times daily.     Dispense:  180 tablet    Refill:  4    Referral Orders  No referral(s) requested today     Electronically signed by: Howard Pouch, [place]

## 2021-06-05 NOTE — Patient Instructions (Signed)
Great to see you today.  I have refilled the medication(s) we provide.   If labs were collected, we will inform you of lab results once received either by echart message or telephone call.   - echart message- for normal results that have been seen by the patient already.   - telephone call: abnormal results or if patient has not viewed results in their echart.  

## 2021-06-10 ENCOUNTER — Other Ambulatory Visit: Payer: Self-pay

## 2021-06-10 ENCOUNTER — Encounter: Payer: Self-pay | Admitting: Family Medicine

## 2021-06-10 ENCOUNTER — Telehealth (INDEPENDENT_AMBULATORY_CARE_PROVIDER_SITE_OTHER): Payer: Medicare Other | Admitting: Family Medicine

## 2021-06-10 ENCOUNTER — Telehealth: Payer: Self-pay | Admitting: Family Medicine

## 2021-06-10 DIAGNOSIS — Z01818 Encounter for other preprocedural examination: Secondary | ICD-10-CM

## 2021-06-10 NOTE — Telephone Encounter (Signed)
Completed preoperative risk eval with patient today.  ?Form placed in [place] work basket. ?Please fax form, recent OV note and recent labs to ortho team.  ? ?Thanks.  ?

## 2021-06-10 NOTE — Telephone Encounter (Signed)
faxed

## 2021-06-10 NOTE — Telephone Encounter (Signed)
OV notes and lab printed.  ?

## 2021-06-10 NOTE — Progress Notes (Signed)
This visit occurred during the SARS-CoV-2 public health emergency.  Safety protocols were in place, including screening questions prior to the visit, additional usage of staff PPE, and extensive cleaning of exam room while observing appropriate contact time as indicated for disinfecting solutions.    [name] , [birth year], 61 y.o., female MRN: [id number] Patient Care Team    Relationship Specialty Notifications Start End  Ma Hillock, DO PCP - General Family Medicine  03/05/15   Gwendel Hanson, MD Referring Physician Urology  04/08/17   Aviva Signs, MD Referring Physician Gastroenterology  04/08/17   Deneise Lever, MD Consulting Physician Pulmonary Disease  04/08/17   Brien Few, MD Consulting Physician Obstetrics and Gynecology  06/05/21     Chief Complaint  Patient presents with   Surgical Clearance     Procedure: 09/14/2021- right total knee arthroplasty by Dr.Alusio Indication:Osteoarthritis/pain Anesthesia:Spinal  Surgery type risk:   - Intermediate risk= orthopedics Prior anesthesia complications: No prior complications.  Family history of prior anesthesia complications:No complications.  Cardiac:    - CBD, PAD, stroke, MI, aortic stenosis> pt denies history.    - METs: > 4 METS Pulmonary: non-smoker, h/o bronchitis- has been w/out flare for a few years. Well controlled advair and albuterol. Pulmonology ( Dr. Annamaria Boots) cleared for surgery. Endocrine: N/A Obesity: BMI 34 Chronic kidney disease: (RCC history)- normal function. GFR 79.9 Chronic med that needs to be continued: mental health meds.  Anticoagulation: N/A  No patient is free of risk when undergoing a procedure. The decision about whether to proceed with the operation belongs to the surgeon and the patient.    Depression screen [facility] 2/9 11/12/2020 06/27/2020 06/27/2019 12/15/2018 06/13/2018  Decreased Interest 0 0 0 0 1  Down, Depressed, Hopeless 0 0 0 0 1  PHQ - 2 Score 0 0 0 0 2   Altered sleeping - 0 0 0 3  Tired, decreased energy - 0 0 0 2  Change in appetite - 0 0 0 2  Feeling bad or failure about yourself  - 0 0 0 0  Trouble concentrating - 0 0 0 2  Moving slowly or fidgety/restless - 0 0 0 0  Suicidal thoughts - 0 0 0 0  PHQ-9 Score - 0 0 0 11  Difficult doing work/chores - - Not difficult at all Not difficult at all Not difficult at all    No Known Allergies Social History   Social History Narrative   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   Lives with her husband, son and granddaughter.   Drinks caffeinated beverages.   Wears her seatbelt, wears a bicycle helmet, there is a smoke detector in home. There are no firearms in the home.   Patient feels safe in her relationships.   Past Medical History:  Diagnosis Date   Anxiety    psychiatrist: Dr. Robina Ade    Arthritis    Colitis    Colon polyps    COPD (chronic obstructive pulmonary disease) ([place])    chronic bronchitis   COVID-19 09/2020   Depression    psychiatrist: Dr. Robina Ade   Elevated liver enzymes    Dr. Rulon Abide following   Fatty liver    GERD (gastroesophageal reflux disease)    Heart murmur    History of chicken pox    History of kidney stones    Primary osteoarthritis of both knees 04/09/2011   Formatting of this note might be  different from the original. Overview:  S/p left total knee replacement   Renal cell carcinoma    hx of left and right; Dr. Nena Alexander   Past Surgical History:  Procedure Laterality Date   CHOLECYSTECTOMY     FETAL RADIO FREQUENCY ABLATION     FRACTURE SURGERY Left    ankle   JOINT REPLACEMENT Left 2009   knee   NEPHRECTOMY     partial LT   OPEN SURGICAL REPAIR OF GLUTEAL TENDON Left 09/27/2018   Procedure: Left hip bursectomy; gluteal tendon repair;  Surgeon: Gaynelle Arabian, MD;  Location: [facility];  Service: Orthopedics;  Laterality: Left;  17mn   TONSILLECTOMY     Family History  Problem Relation Age of Onset    Hypertension Mother    Alzheimer's disease Mother    Arthritis Mother    Hypertension Father    Alzheimer's disease Father    COPD Father    Heart disease Father    Arthritis Father    Hypertension Sister    Allergies as of 06/10/2021   No Known Allergies      Medication List        Accurate as of June 10, 2021  1:07 PM. If you have any questions, ask your nurse or doctor.          Advair Diskus 100-50 MCG/ACT Aepb Generic drug: fluticasone-salmeterol USE 1 INHALATION TWO TIMES A DAY THEN RINSE MOUTH   albuterol (2.5 MG/3ML) 0.083% nebulizer solution Commonly known as: PROVENTIL Take 3 mLs (2.5 mg total) by nebulization every 6 (six) hours as needed for wheezing or shortness of breath.   albuterol 108 (90 Base) MCG/ACT inhaler Commonly known as: VENTOLIN HFA Inhale 1-2 puffs into the lungs every 6 (six) hours as needed for shortness of breath.   ALPRAZolam 0.5 MG tablet Commonly known as: XANAX Take 0.5 mg by mouth 2 (two) times daily as needed for anxiety.   amphetamine-dextroamphetamine 30 MG 24 hr capsule Commonly known as: ADDERALL XR Take 30 mg by mouth daily.   FLUoxetine 20 MG capsule Commonly known as: PROZAC Take 20 mg by mouth daily.   hydrochlorothiazide 25 MG tablet Commonly known as: HYDRODIURIL Take 1 tablet (25 mg total) by mouth daily.   Lybalvi 5-10 MG Tabs Generic drug: OLANZapine-Samidorphan Take by mouth.   Magnesium 250 MG Tabs Take 250 mg by mouth daily.   potassium chloride SA 20 MEQ tablet Commonly known as: KLOR-CON M Take 1 tablet (20 mEq total) by mouth 2 (two) times daily.   PSYLLIUM HUSK PO   RED YEAST RICE PO   TURMERIC PO 1,650 mg.   vitamin C 500 MG tablet Commonly known as: ASCORBIC ACID Take 500 mg by mouth daily.   Vitamin D-3 125 MCG (5000 UT) Tabs Take 5,000 Units by mouth daily.        All past medical history, surgical history, allergies, family history, immunizations andmedications were updated in  the EMR today and reviewed under the history and medication portions of their EMR.     ROS Negative, with the exception of above mentioned in HPI  Objective:  LMP 04/22/2013  There is no height or weight on file to calculate BMI.  Physical Exam Gen: Afebrile. No acute distress. Nontoxic in appearance, well developed, well nourished.  HENT: AT. [place].  Eyes:Pupils Equal Round Reactive to light, Extraocular movements intact,  Conjunctiva without redness, discharge or icterus. Neuro: Alert. Oriented x3  Psych: Normal affect, dress and demeanor. Normal speech. Normal  thought content and judgment.  No results found. No results found. No results found for this or any previous visit (from the past 24 hour(s)).  Assessment/Plan: Mikesha Migliaccio is a 61 y.o. female present for OV for  Preoperative clearance To the best of my knowledge and per patients reported PMH, there is not a medical contraindication for undergoing surgery. She is of low/average risk for medical/cardiac complications   Avoid tumeric and omega 3 use prior to surgical, timeline  per procedure per ortho team recs.  Patient understands the purpose of preoperative visit is to attempt to minimize surgical complications and communicate to surgical team chronic conditions and management. No patient is free of risk when undergoing a procedure. The decision about whether to proceed with the operation belongs to the surgeon and the patient. Patient's chronic conditions have been stable.  She is not anemic.  She is not a diabetic. her BMI is in the obese range, but less than 40 per requirement. BMI 34.  HGB 13.1 A1c 5.7 Normal albumin.   Reviewed expectations re: course of current medical issues. Discussed self-management of symptoms. Outlined signs and symptoms indicating need for more acute intervention. Patient verbalized understanding and all questions were answered. Patient received an After-Visit Summary.    No orders  of the defined types were placed in this encounter.  No orders of the defined types were placed in this encounter.  Referral Orders  No referral(s) requested today     Note is dictated utilizing voice recognition software. Although note has been proof read prior to signing, occasional typographical errors still can be missed. If any questions arise, please do not hesitate to call for verification.   electronically signed by:  Howard Pouch, DO  [place]

## 2021-06-29 ENCOUNTER — Other Ambulatory Visit: Payer: Self-pay | Admitting: Family Medicine

## 2021-06-29 DIAGNOSIS — Z1231 Encounter for screening mammogram for malignant neoplasm of breast: Secondary | ICD-10-CM

## 2021-07-07 ENCOUNTER — Ambulatory Visit: Payer: Medicare Other

## 2021-07-29 DIAGNOSIS — M1711 Unilateral primary osteoarthritis, right knee: Secondary | ICD-10-CM | POA: Insufficient documentation

## 2021-07-29 HISTORY — DX: Unilateral primary osteoarthritis, right knee: M17.11

## 2021-07-30 DIAGNOSIS — M1711 Unilateral primary osteoarthritis, right knee: Secondary | ICD-10-CM | POA: Diagnosis not present

## 2021-08-17 DIAGNOSIS — K529 Noninfective gastroenteritis and colitis, unspecified: Secondary | ICD-10-CM | POA: Diagnosis not present

## 2021-09-01 ENCOUNTER — Other Ambulatory Visit (HOSPITAL_COMMUNITY): Payer: Medicare Other

## 2021-09-03 ENCOUNTER — Ambulatory Visit
Admission: RE | Admit: 2021-09-03 | Discharge: 2021-09-03 | Disposition: A | Discharge status: Home / Self Care | Payer: Medicare Other | Source: Ambulatory Visit | Attending: Family Medicine | Admitting: Family Medicine

## 2021-09-03 DIAGNOSIS — E2839 Other primary ovarian failure: Secondary | ICD-10-CM

## 2021-09-03 DIAGNOSIS — E559 Vitamin D deficiency, unspecified: Secondary | ICD-10-CM

## 2021-09-03 DIAGNOSIS — M8589 Other specified disorders of bone density and structure, multiple sites: Secondary | ICD-10-CM | POA: Diagnosis not present

## 2021-09-03 DIAGNOSIS — M8580 Other specified disorders of bone density and structure, unspecified site: Secondary | ICD-10-CM

## 2021-09-04 ENCOUNTER — Telehealth: Payer: Self-pay | Admitting: Family Medicine

## 2021-09-04 ENCOUNTER — Telehealth: Payer: Self-pay | Admitting: Internal Medicine

## 2021-09-04 MED ORDER — ALBUTEROL SULFATE HFA 108 (90 BASE) MCG/ACT IN AERS: 1.0000 | INHALATION_SPRAY | Freq: Four times a day (QID) | RESPIRATORY_TRACT | 3 refills | Status: DC | PRN

## 2021-09-04 NOTE — Telephone Encounter (Signed)
Patient states she needs refill on albuterol inhaler sent to [facility]. For some reason they took it off her list.  Please advise.

## 2021-09-04 NOTE — Telephone Encounter (Signed)
Called and spoke with patient who is asking for a refill on her rescue inhaler. RX has been sent in to preferred pharmacy. Nothing further needed at this time.

## 2021-09-04 NOTE — Telephone Encounter (Signed)
Please inform patient her bone density results have returned: She has had a decrease in her bone density in her femur. Her T score had been -1.5 and is now -2 .0 in the right femur.  The spinal bone density remains about the same. This reading is still in the osteopenia range.    There are medications for osteopenia, such as fosamax once weekly. This can cause issues in people with reflux. If she would like to try fosamax once weekly We can call that in for her.   Osteopenia/osteoporosis: 1.) Drink alcohol in moderation only 2.) Decrease caffeine consumption to no  More than 2.5 cups/d 3.) Exercise: weight bearing (walking counts), strength and balance training. 4.) No smoking.  5.) Sunlight/Ultraviolet light exposure 30 minutes a day/5 days a week. 6.) Vitamin D supplement

## 2021-09-04 NOTE — Telephone Encounter (Signed)
Spoke with pt regarding labs and instructions. Pt declined medication at this time.

## 2021-09-14 DIAGNOSIS — M1711 Unilateral primary osteoarthritis, right knee: Secondary | ICD-10-CM

## 2021-09-17 ENCOUNTER — Telehealth: Payer: Self-pay | Admitting: Internal Medicine

## 2021-09-17 ENCOUNTER — Telehealth: Payer: Self-pay

## 2021-09-17 NOTE — Telephone Encounter (Signed)
Maybe we can get her in with one of the APPS for surgical clearance ?

## 2021-09-17 NOTE — Telephone Encounter (Signed)
Fax received from Dr. Gaynelle Arabian with [facility] to perform a Right total knee arthroplasty on patient.  Patient needs surgery clearance. Patient was seen on 05/12/2021. Office protocol is a risk assessment can be sent to surgeon if patient has been seen in 60 days or less.   Sending to Dr. Annamaria Boots for risk assessment or recommendations if patient needs to be seen in office prior to surgical procedure.

## 2021-09-17 NOTE — Telephone Encounter (Signed)
Request for surgical clearance was received today from [facility].  All fields must be completed.If fields unknown, surgical office must be contacted. What type of surgery is being performed? Right total knee arthroplasty   What type of anesthesia? spinal anesthesia When is this surgery scheduled? 12/21/21  Name of physician performing surgery? Dr. Gaynelle Arabian   Patient has been called and appt for surgical clearance has been scheduled: Yes, pt had appt in March. Per their office she doesn't not need another appt. Pt is going to call their office to see if they want [place] to redo the form and will sched appt if they do.   [name]

## 2021-09-18 ENCOUNTER — Ambulatory Visit
Admission: RE | Admit: 2021-09-18 | Discharge: 2021-09-18 | Disposition: A | Discharge status: Home / Self Care | Payer: Medicare Other | Source: Ambulatory Visit | Attending: Family Medicine | Admitting: Family Medicine

## 2021-09-18 DIAGNOSIS — Z1231 Encounter for screening mammogram for malignant neoplasm of breast: Secondary | ICD-10-CM | POA: Diagnosis not present

## 2021-09-21 ENCOUNTER — Encounter: Payer: Self-pay | Admitting: Family Medicine

## 2021-09-21 NOTE — Telephone Encounter (Signed)
ATC patient to get her scheduled for OV with APP, LMTCB

## 2021-09-22 ENCOUNTER — Encounter: Payer: Self-pay | Admitting: Family Medicine

## 2021-09-22 ENCOUNTER — Ambulatory Visit (INDEPENDENT_AMBULATORY_CARE_PROVIDER_SITE_OTHER): Payer: Medicare Other | Admitting: Family Medicine

## 2021-09-22 VITALS — BP 93/65 | HR 73 | Temp 98.2°F | Ht 62.0 in | Wt 187.0 lb

## 2021-09-22 DIAGNOSIS — M85851 Other specified disorders of bone density and structure, right thigh: Secondary | ICD-10-CM

## 2021-09-22 MED ORDER — ALENDRONATE SODIUM 35 MG PO TABS: 35.0000 mg | ORAL_TABLET | ORAL | 0 refills | Status: DC

## 2021-09-22 NOTE — Progress Notes (Signed)
[name] , Sep 13, 1960, 61 y.o., female MRN: [id number] Patient Care Team    Relationship Specialty Notifications Start End  Ma Hillock, DO PCP - General Family Medicine  03/05/15   Gwendel Hanson, MD Referring Physician Urology  04/08/17   Aviva Signs, MD Referring Physician Gastroenterology  04/08/17   Deneise Lever, MD Consulting Physician Pulmonary Disease  04/08/17   Brien Few, MD Consulting Physician Obstetrics and Gynecology  06/05/21     Chief Complaint  Patient presents with   Osteoporosis    Questions in      Subjective: Pt presents for an OV to discuss bone density results.  Bone density resulted with mild osteopenia mostly affecting the right femur neck with a T score of -2.0.  She has mild decrease in bone density L1-L2 at -1.8.  She does appointment with vitamin D 1000 units daily.  She does not take calcium secondary to history of kidney stones.  She has a history of reflux.  She has never been tried on bisphosphonate in the past.  Her kidney function is normal, however she does have a history of renal cell carcinoma.  ASSESSMENT: The BMD measured at Femur Neck Right is 0.763 g/cm2 with a T-score of -2.0. This patient is considered osteopenic/low bone mass according to [facility]) criteria. AP Spine  L1-L2   -1.8     DualFemur Neck Right  -2.0  DualFemur -1.6      11/12/2020    9:18 AM 06/27/2020    1:10 PM 06/27/2019   10:23 AM 12/15/2018    9:53 AM 06/13/2018   10:44 AM  Depression screen PHQ 2/9  Decreased Interest 0 0 0 0 1  Down, Depressed, Hopeless 0 0 0 0 1  PHQ - 2 Score 0 0 0 0 2  Altered sleeping  0 0 0 3  Tired, decreased energy  0 0 0 2  Change in appetite  0 0 0 2  Feeling bad or failure about yourself   0 0 0 0  Trouble concentrating  0 0 0 2  Moving slowly or fidgety/restless  0 0 0 0  Suicidal thoughts  0 0 0 0  PHQ-9 Score  0 0 0 11  Difficult doing work/chores   Not difficult at all  Not difficult at all Not difficult at all    No Known Allergies Social History   Social History Narrative   Married. 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   Lives with her husband, son and granddaughter.   Drinks caffeinated beverages.   Wears her seatbelt, wears a bicycle helmet, there is a smoke detector in home. There are no firearms in the home.   Patient feels safe in her relationships.   Past Medical History:  Diagnosis Date   Anxiety    psychiatrist: Dr. Robina Ade    Arthritis    Colitis    Colon polyps    COPD (chronic obstructive pulmonary disease) ([place])    chronic bronchitis- nonsmoker, more asthma/RAD   COVID-19 09/2020   Depression    psychiatrist: Dr. Robina Ade   Elevated liver enzymes    Dr. Rulon Abide following   Fatty liver    GERD (gastroesophageal reflux disease)    Heart murmur    History of chicken pox    History of kidney stones    Primary osteoarthritis of both knees 04/09/2011   Formatting of this note  might be different from the original. Overview:  S/p left total knee replacement   Renal cell carcinoma    hx of left and right; Dr. Nena Alexander   Past Surgical History:  Procedure Laterality Date   CHOLECYSTECTOMY     FETAL RADIO FREQUENCY ABLATION     FRACTURE SURGERY Left    ankle   JOINT REPLACEMENT Left 2009   knee   NEPHRECTOMY     partial LT   OPEN SURGICAL REPAIR OF GLUTEAL TENDON Left 09/27/2018   Procedure: Left hip bursectomy; gluteal tendon repair;  Surgeon: Gaynelle Arabian, MD;  Location: [facility];  Service: Orthopedics;  Laterality: Left;  83mn   TONSILLECTOMY     Family History  Problem Relation Age of Onset   Hypertension Mother    Alzheimer's disease Mother    Arthritis Mother    Hypertension Father    Alzheimer's disease Father    COPD Father    Heart disease Father    Arthritis Father    Hypertension Sister    Allergies as of 09/22/2021   No Known Allergies      Medication List        Accurate  as of September 22, 2021 11:03 AM. If you have any questions, ask your nurse or doctor.          STOP taking these medications    Magnesium 250 MG Tabs Stopped by: RHoward Pouch DO   vitamin C 500 MG tablet Commonly known as: ASCORBIC ACID Stopped by: RHoward Pouch DO       TAKE these medications    Advair Diskus 100-50 MCG/ACT Aepb Generic drug: fluticasone-salmeterol USE 1 INHALATION TWO TIMES A DAY THEN RINSE MOUTH   albuterol (2.5 MG/3ML) 0.083% nebulizer solution Commonly known as: PROVENTIL Take 3 mLs (2.5 mg total) by nebulization every 6 (six) hours as needed for wheezing or shortness of breath.   albuterol 108 (90 Base) MCG/ACT inhaler Commonly known as: VENTOLIN HFA Inhale 1-2 puffs into the lungs every 6 (six) hours as needed for shortness of breath.   alendronate 35 MG tablet Commonly known as: FOSAMAX Take 1 tablet (35 mg total) by mouth every 7 (seven) days. Take with a full glass of water on an empty stomach. Started by: RHoward Pouch DO   ALPRAZolam 0.5 MG tablet Commonly known as: XANAX Take 0.5 mg by mouth 2 (two) times daily as needed for anxiety.   amphetamine-dextroamphetamine 30 MG 24 hr capsule Commonly known as: ADDERALL XR Take 30 mg by mouth daily.   FLUoxetine 20 MG capsule Commonly known as: PROZAC Take 20 mg by mouth daily.   hydrochlorothiazide 25 MG tablet Commonly known as: HYDRODIURIL Take 1 tablet (25 mg total) by mouth daily.   Lybalvi 5-10 MG Tabs Generic drug: OLANZapine-Samidorphan Take by mouth.   MAGNESIUM-ZINC PO Take by mouth.   potassium chloride SA 20 MEQ tablet Commonly known as: KLOR-CON M Take 1 tablet (20 mEq total) by mouth 2 (two) times daily.   PSYLLIUM HUSK PO   RED YEAST RICE PO   TURMERIC PO 1,650 mg.   Vitamin D-3 125 MCG (5000 UT) Tabs Take 5,000 Units by mouth daily.        All past medical history, surgical history, allergies, family history, immunizations andmedications were updated in  the EMR today and reviewed under the history and medication portions of their EMR.     ROS Negative, with the exception of above mentioned in HPI   Objective:  BP 93/65  Pulse 73   Temp 98.2 F (36.8 C) (Oral)   Ht '5\' 2"'$  (1.575 m)   Wt 187 lb (84.8 kg)   LMP 04/03/2013   SpO2 97%   BMI 34.20 kg/m  Body mass index is 34.2 kg/m. Physical Exam Vitals and nursing note reviewed.  Constitutional:      General: She is not in acute distress.    Appearance: Normal appearance. She is normal weight. She is not ill-appearing or toxic-appearing.  Eyes:     Extraocular Movements: Extraocular movements intact.     Conjunctiva/sclera: Conjunctivae normal.     Pupils: Pupils are equal, round, and reactive to light.  Neurological:     Mental Status: She is alert and oriented to person, place, and time. Mental status is at baseline.  Psychiatric:        Mood and Affect: Mood normal.        Behavior: Behavior normal.        Thought Content: Thought content normal.        Judgment: Judgment normal.      No results found. No results found. No results found for this or any previous visit (from the past 24 hour(s)).  Assessment/Plan: Dani Wallner is a 61 y.o. female present for OV for  Osteopenia of neck of right femur; T score -2.0 He was counseled on the difference between osteopenia and osteoporosis.   Recommend daily weightbearing exercises Continue vitamin D supplementation, per EMR vitamin D was normal when last checked 06/2021. Start Fosamax 35 mg once weekly with a full glass of water on an empty stomach.  Patient was instructed on proper use and side effects to monitor for.  If her reflux is noted to worsen, she will discontinue medication.  If no side effects are appreciated, she will call in within the next 3-4 weeks and we will call in the medication to her mail-in pharmacy for her.  Reviewed expectations re: course of current medical issues. Discussed self-management  of symptoms. Outlined signs and symptoms indicating need for more acute intervention. Patient verbalized understanding and all questions were answered. Patient received an After-Visit Summary.    No orders of the defined types were placed in this encounter.  Meds ordered this encounter  Medications   alendronate (FOSAMAX) 35 MG tablet    Sig: Take 1 tablet (35 mg total) by mouth every 7 (seven) days. Take with a full glass of water on an empty stomach.    Dispense:  4 tablet    Refill:  0   Referral Orders  No referral(s) requested today     Note is dictated utilizing voice recognition software. Although note has been proof read prior to signing, occasional typographical errors still can be missed. If any questions arise, please do not hesitate to call for verification.   electronically signed by:  Howard Pouch, DO  [place]

## 2021-09-22 NOTE — Patient Instructions (Signed)
Osteopenia  Osteopenia is a loss of thickness (density) inside the bones. Another name for osteopenia is low bone mass. Mild osteopenia is a normal part of aging. It is not a disease, and it does not cause symptoms. However, if you have osteopenia and continue to lose bone mass, you could develop a condition that causes the bones to become thin and break more easily (osteoporosis). Osteoporosis can cause you to lose some height, have back pain, and have a stooped posture. Although osteopenia is not a disease, making changes to your lifestyle and diet can help to prevent osteopenia from developing into osteoporosis. What are the causes? Osteopenia is caused by loss of calcium in the bones. Bones are constantly changing. Old bone cells are continually being replaced with new bone cells. This process builds new bone. The mineral calcium is needed to build new bone and maintain bone density. Bone density is usually highest around age 35. After that, most people's bodies cannot replace all the bone they have lost with new bone. What increases the risk? You are more likely to develop this condition if: You are older than age 50. You are a woman who went through menopause early. You have a long illness that keeps you in bed. You do not get enough exercise. You lack certain nutrients (malnutrition). You have an overactive thyroid gland (hyperthyroidism). You use products that contain nicotine or tobacco, such as cigarettes, e-cigarettes and chewing tobacco, or you drink a lot of alcohol. You are taking medicines that weaken the bones, such as steroids. What are the signs or symptoms? This condition does not cause any symptoms. You may have a slightly higher risk for bone breaks (fractures), so getting fractures more easily than normal may be an indication of osteopenia. How is this diagnosed? This condition may be diagnosed based on an X-ray exam that measures bone density (dual-energy X-ray  absorptiometry, or DEXA). This test can measure bone density in your hips, spine, and wrists. Osteopenia has no symptoms, so this condition is usually diagnosed after a routine bone density screening test is done for osteoporosis. This routine screening is usually done for: Women who are age 65 or older. Men who are age 70 or older. If you have risk factors for osteopenia, you may have the screening test at an earlier age. How is this treated? Making dietary and lifestyle changes can lower your risk for osteoporosis. If you have severe osteopenia that is close to becoming osteoporosis, this condition can be treated with medicines and dietary supplements such as calcium and vitamin D. These supplements help to rebuild bone density. Follow these instructions at home: Eating and drinking Eat a diet that is high in calcium and vitamin D. Calcium is found in dairy products, beans, salmon, and leafy green vegetables like spinach and broccoli. Look for foods that have vitamin D and calcium added to them (fortified foods), such as orange juice, cereal, and bread.  Lifestyle Do 30 minutes or more of a weight-bearing exercise every day, such as walking, jogging, or playing a sport. These types of exercises strengthen the bones. Do not use any products that contain nicotine or tobacco, such as cigarettes, e-cigarettes, and chewing tobacco. If you need help quitting, ask your health care provider. Do not drink alcohol if: Your health care provider tells you not to drink. You are pregnant, may be pregnant, or are planning to become pregnant. If you drink alcohol: Limit how much you use to: 0-1 drink a day for women. 0-2   drinks a day for men. Be aware of how much alcohol is in your drink. In the [place] one drink equals one 12 oz bottle of beer (355 mL), one 5 oz glass of wine (148 mL), or one 1 oz glass of hard liquor (44 mL). General instructions Take over-the-counter and prescription medicines only as  told by your health care provider. These include vitamins and supplements. Take precautions at home to lower your risk of falling, such as: Keeping rooms well-lit and free of clutter, such as cords. Installing safety rails on stairs. Using rubber mats in the bathroom or other areas that are often wet or slippery. Keep all follow-up visits. This is important. Contact a health care provider if: You have not had a bone density screening for osteoporosis and you are: A woman who is age 65 or older. A man who is age 70 or older. You are a postmenopausal woman who has not had a bone density screening for osteoporosis. You are older than age 50 and you want to know if you should have bone density screening for osteoporosis. Summary Osteopenia is a loss of thickness (density) inside the bones. Another name for osteopenia is low bone mass. Osteopenia is not a disease, but it may increase your risk for a condition that causes the bones to become thin and break more easily (osteoporosis). You may be at risk for osteopenia if you are older than age 50 or if you are a woman who went through early menopause. Osteopenia does not cause any symptoms, but it can be diagnosed with a bone density screening test. Dietary and lifestyle changes are the first treatment for osteopenia. These may lower your risk for osteoporosis. This information is not intended to replace advice given to you by your health care provider. Make sure you discuss any questions you have with your health care provider. Document Revised: 09/06/2019 Document Reviewed: 09/06/2019 Elsevier Patient Education  2023 [employer] Inc[employer]  

## 2021-10-01 NOTE — Telephone Encounter (Signed)
Next Appt With Pulmonology Clayton Bibles, NP) 10/13/2021 at 9:00 AM

## 2021-10-13 ENCOUNTER — Ambulatory Visit (INDEPENDENT_AMBULATORY_CARE_PROVIDER_SITE_OTHER): Payer: Medicare Other

## 2021-10-13 ENCOUNTER — Encounter: Payer: Self-pay | Admitting: Nurse Practitioner

## 2021-10-13 ENCOUNTER — Ambulatory Visit (INDEPENDENT_AMBULATORY_CARE_PROVIDER_SITE_OTHER): Payer: Medicare Other | Admitting: Nurse Practitioner

## 2021-10-13 VITALS — BP 110/78 | HR 98 | Ht 61.0 in | Wt 189.8 lb

## 2021-10-13 DIAGNOSIS — Z01818 Encounter for other preprocedural examination: Secondary | ICD-10-CM | POA: Diagnosis not present

## 2021-10-13 DIAGNOSIS — Z01811 Encounter for preprocedural respiratory examination: Secondary | ICD-10-CM

## 2021-10-13 DIAGNOSIS — J4520 Mild intermittent asthma, uncomplicated: Secondary | ICD-10-CM

## 2021-10-13 DIAGNOSIS — J4489 Other specified chronic obstructive pulmonary disease: Secondary | ICD-10-CM

## 2021-10-13 DIAGNOSIS — J449 Chronic obstructive pulmonary disease, unspecified: Secondary | ICD-10-CM

## 2021-10-13 MED ORDER — MONTELUKAST SODIUM 10 MG PO TABS: 10.0000 mg | ORAL_TABLET | Freq: Every day | ORAL | 5 refills | Status: DC

## 2021-10-13 NOTE — Assessment & Plan Note (Signed)
Compensated on current regimen. She seems to have an allergic component to her asthma and flares during allergy seasons with increased albuterol use. Eosinophils from March 2023 were 500. We discussed adding on Singulair during the fall and spring months, which she has tried in the past and was eager to use again. Reviewed potential side effects. Also recommended she use OTC antihistamine during these months. Continued Advair Twice daily and prn albuterol.  Patient Instructions  Continue Albuterol inhaler 2 puffs or 3 mL neb every 6 hours as needed for shortness of breath or wheezing. Notify if symptoms persist despite rescue inhaler/neb use. Continue Advair Diskus 1 puff Twice daily. Brush tongue and rinse mouth afterwards  Start Singulair 10 mg At bedtime for asthma/allergies during the fall and spring Use an over the counter antihistamine such as Zyrtec, Claritin, Xyzal, or Allegra in the morning during the fall and spring months as well  Take your inhalers with you to surgery. Out of bed as soon as possible after surgery. Use incentive spirometer 10 times an hour while awake.   Follow up as scheduled with Dr. Annamaria Boots in February. If symptoms do not improve or worsen, please contact office for sooner follow up or seek emergency care.

## 2021-10-13 NOTE — Patient Instructions (Addendum)
Continue Albuterol inhaler 2 puffs or 3 mL neb every 6 hours as needed for shortness of breath or wheezing. Notify if symptoms persist despite rescue inhaler/neb use. Continue Advair Diskus 1 puff Twice daily. Brush tongue and rinse mouth afterwards  Start Singulair 10 mg At bedtime for asthma/allergies during the fall and spring Use an over the counter antihistamine such as Zyrtec, Claritin, Xyzal, or Allegra in the morning during the fall and spring months as well  Take your inhalers with you to surgery. Out of bed as soon as possible after surgery. Use incentive spirometer 10 times an hour while awake.   Follow up as scheduled with Dr. Annamaria Boots in February. If symptoms do not improve or worsen, please contact office for sooner follow up or seek emergency care.

## 2021-10-13 NOTE — Telephone Encounter (Signed)
Completed today. Thanks.

## 2021-10-13 NOTE — Progress Notes (Signed)
$'@Patient'K$  ID: [name], female    DOB: 05/31/1960, 61 y.o.   MRN: [id number]  Chief Complaint  Patient presents with   Follow-up    Pt here for surgical clearance, total knee replacement.    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  She is a patient of Dr. Janee [name] and last seen in office 05/12/2021.  Past medical history significant for GERD/LPR, history of renal cell cancer, hypertension, HLD, anxiety and depression, obesity.  TEST/EVENTS:  03/15/2016 HRCT chest: atherosclerosis. No evidence of ILD. There were a few scattered pulmonary nodules up to 5 mm.  03/09/2021 CXR 2 view [facility]: Persistent mild elevation of the right hemidiaphragm with associated right basilar atelectasis.  05/12/2021: OV with Dr. Annamaria Boots.  Breathing stable.  Reviewed CXR from [place] - stable. She had COVID in October and June - no residual effects. Preop clearance provided for TKR with Dr. Maureen [name].   10/13/2021: Today-surgical clearance Patient presents today for surgical clearance prior to right total knee replacement with Dr. Maureen [name].  She was supposed to have this surgery back in June but she had to cancel due to finding out her granddaughter had a brain tumor. She reports that her breathing has been stable. She has not had any recent flares requiring prednisone or abx. She has not had to use her albuterol rescue since the spring. She does notice that her breathing tends to be worse during allergy seasons. She will develop chest tightness and require her rescue inhaler, especially when she goes outside. She also coughs more often and notices a wheeze. She says that she probably averaged 5 uses per week during spring season. Since the pollen has died down, she has felt much better. She denies any recent persistent cough, wheezing, shortness of breath, URI symptoms. She continues on Advair  Twice daily.   No Active Allergies  Immunization History  Administered Date(s) Administered   Influenza Split 01/24/2012, 01/06/2013, 01/03/2014, 12/16/2014, 01/15/2015   Influenza,inj,Quad PF,6+ Mos 12/16/2014, 01/06/2016, 11/25/2016, 12/15/2017, 12/15/2018, 01/07/2020, 12/15/2020   Influenza-Unspecified 12/16/2014, 01/06/2016, 11/25/2016, 12/15/2017, 12/15/2018, 01/07/2020   PFIZER(Purple Top)SARS-COV-2 Vaccination 06/21/2019, 07/12/2019, 01/15/2020, 08/26/2020, 12/30/2020   Pneumococcal Conjugate-13 03/24/2015   Pneumococcal Polysaccharide-23 12/17/2013   Td 09/03/2020   Tdap 03/24/2015    Past Medical History:  Diagnosis Date   Anxiety    psychiatrist: Dr. Robina [name]    Arthritis    Colitis    Colon polyps    COPD (chronic obstructive pulmonary disease) ([place])    chronic bronchitis- nonsmoker, more asthma/RAD   COVID-19 09/2020   Depression    psychiatrist: Dr. Robina [name]   Elevated liver enzymes    Dr. Rulon Abide following   Fatty liver    GERD (gastroesophageal reflux disease)    Heart murmur    History of chicken pox    History of kidney stones    Primary osteoarthritis of both knees 04/09/2011   Formatting of this note might be different from the original. Overview:  S/p left total knee replacement   Renal cell carcinoma    hx of left and right; Dr. Nena [name]    Tobacco History: Social History   Tobacco Use  Smoking Status Never   Passive exposure: Never  Smokeless Tobacco Never   Counseling given: Not Answered   Outpatient Medications Prior to Visit  Medication Sig Dispense Refill   ADVAIR DISKUS 100-50 MCG/ACT AEPB USE 1 INHALATION TWO TIMES  A DAY THEN RINSE MOUTH 180 each 3   albuterol (PROVENTIL) (2.5 MG/3ML) 0.083% nebulizer solution Take 3 mLs (2.5 mg total) by nebulization every 6 (six) hours as needed for wheezing or shortness of breath. 75 mL 12   albuterol (VENTOLIN HFA) 108 (90 Base) MCG/ACT inhaler Inhale 1-2 puffs into the lungs every 6 (six) hours as needed  for shortness of breath. 3 each 3   alendronate (FOSAMAX) 35 MG tablet Take 1 tablet (35 mg total) by mouth every 7 (seven) days. Take with a full glass of water on an empty stomach. 4 tablet 0   ALPRAZolam (XANAX) 0.5 MG tablet Take 0.5 mg by mouth 2 (two) times daily as needed for anxiety.     amphetamine-dextroamphetamine (ADDERALL XR) 30 MG 24 hr capsule Take 30 mg by mouth daily.     Cholecalciferol (VITAMIN D-3) 5000 units TABS Take 5,000 Units by mouth daily.     FLUoxetine (PROZAC) 20 MG capsule Take 20 mg by mouth daily.     hydrochlorothiazide (HYDRODIURIL) 25 MG tablet Take 1 tablet (25 mg total) by mouth daily. 90 tablet 1   MAGNESIUM-ZINC PO Take by mouth.     OLANZapine-Samidorphan (LYBALVI) 5-10 MG TABS Take by mouth.     potassium chloride SA (KLOR-CON M) 20 MEQ tablet Take 1 tablet (20 mEq total) by mouth 2 (two) times daily. 180 tablet 4   PSYLLIUM HUSK PO      Red Yeast Rice Extract (RED YEAST RICE PO)      TURMERIC PO 1,650 mg.     No facility-administered medications prior to visit.     Review of Systems:   Constitutional: No weight loss or gain, night sweats, fevers, chills, fatigue, or lassitude. HEENT: No headaches, difficulty swallowing, tooth/dental problems, or sore throat. No sneezing, itching, ear ache, nasal congestion, or post nasal drip CV:  No chest pain, orthopnea, PND, swelling in lower extremities, anasarca, dizziness, palpitations, syncope Resp: No shortness of breath with exertion or at rest. No excess mucus or change in color of mucus. No productive or non-productive. No hemoptysis. No wheezing.  No chest wall deformity Skin: No rash, lesions, ulcerations MSK:  +right knee pain. No decreased range of motion.  No back pain. Neuro: No dizziness or lightheadedness.  Psych: No depression or anxiety. Mood stable.     Physical Exam:  BP 110/78   Pulse 98   Ht '5\' 1"'$  (1.549 m)   Wt 189 lb 12.8 oz (86.1 kg)   LMP 04/03/2013   SpO2 98%   BMI 35.86  kg/m   GEN: Pleasant, interactive, well-appearing; obese; in no acute distress. HEENT:  Normocephalic and atraumatic. PERRLA. Sclera white. Nasal turbinates pink, moist and patent bilaterally. No rhinorrhea present. Oropharynx pink and moist, without exudate or edema. No lesions, ulcerations, or postnasal drip.  NECK:  Supple w/ fair ROM. No JVD present.  CV: RRR, no m/r/g, no peripheral edema. Pulses intact, +2 bilaterally. No cyanosis, pallor or clubbing. PULMONARY:  Unlabored, regular breathing. Clear bilaterally A&P w/o wheezes/rales/rhonchi. No accessory muscle use. No dullness to percussion. GI: BS present and normoactive. Soft, non-tender to palpation. No organomegaly or masses detected. No CVA tenderness. MSK: No erythema, warmth or tenderness. Cap refil <2 sec all extrem. No deformities or joint swelling noted.  Neuro: A/Ox3. No focal deficits noted.   Skin: Warm, no lesions or rashe Psych: Normal affect and behavior. Judgement and thought content appropriate.     Lab Results:  CBC  Component Value Date/Time   WBC 6.5 06/05/2021 1107   RBC 4.39 06/05/2021 1107   HGB 13.1 06/05/2021 1107   HCT 39.1 06/05/2021 1107   PLT 363.0 06/05/2021 1107   MCV 89.0 06/05/2021 1107   MCH 28.7 09/21/2018 0940   MCHC 33.5 06/05/2021 1107   RDW 15.0 06/05/2021 1107   LYMPHSABS 2.8 06/05/2021 1107   MONOABS 0.5 06/05/2021 1107   EOSABS 0.5 06/05/2021 1107   BASOSABS 0.0 06/05/2021 1107    BMET    Component Value Date/Time   NA 135 06/05/2021 1107   K 3.6 06/05/2021 1107   CL 98 06/05/2021 1107   CO2 28 06/05/2021 1107   GLUCOSE 88 06/05/2021 1107   BUN 19 06/05/2021 1107   CREATININE 0.80 06/05/2021 1107   CALCIUM 9.8 06/05/2021 1107   GFRNONAA >60 09/21/2018 0940   GFRAA >60 09/21/2018 0940    BNP No results found for: "BNP"   Imaging:  MM 3D SCREEN BREAST BILATERAL  Result Date: 09/21/2021 CLINICAL DATA:  Screening. EXAM: DIGITAL SCREENING BILATERAL MAMMOGRAM WITH  TOMOSYNTHESIS AND CAD TECHNIQUE: Bilateral screening digital craniocaudal and mediolateral oblique mammograms were obtained. Bilateral screening digital breast tomosynthesis was performed. The images were evaluated with computer-aided detection. COMPARISON:  Previous exam(s). ACR Breast Density Category b: There are scattered areas of fibroglandular density. FINDINGS: There are no findings suspicious for malignancy. IMPRESSION: No mammographic evidence of malignancy. A result letter of this screening mammogram will be mailed directly to the patient. RECOMMENDATION: Screening mammogram in one year. (Code:SM-B-01Y) BI-RADS CATEGORY  1: Negative. Electronically Signed   By: Lillia Mountain M.D.   On: 09/21/2021 11:19          No data to display          No results found for: "NITRICOXIDE"      Assessment & Plan:   Mild intermittent asthma Compensated on current regimen. She seems to have an allergic component to her asthma and flares during allergy seasons with increased albuterol use. Eosinophils from March 2023 were 500. We discussed adding on Singulair during the fall and spring months, which she has tried in the past and was eager to use again. Reviewed potential side effects. Also recommended she use OTC antihistamine during these months. Continued Advair Twice daily and prn albuterol.  Patient Instructions  Continue Albuterol inhaler 2 puffs or 3 mL neb every 6 hours as needed for shortness of breath or wheezing. Notify if symptoms persist despite rescue inhaler/neb use. Continue Advair Diskus 1 puff Twice daily. Brush tongue and rinse mouth afterwards  Start Singulair 10 mg At bedtime for asthma/allergies during the fall and spring Use an over the counter antihistamine such as Zyrtec, Claritin, Xyzal, or Allegra in the morning during the fall and spring months as well  Take your inhalers with you to surgery. Out of bed as soon as possible after surgery. Use incentive spirometer 10 times  an hour while awake.   Follow up as scheduled with Dr. Annamaria Boots in February. If symptoms do not improve or worsen, please contact office for sooner follow up or seek emergency care.     Preop respiratory exam Mild to moderate risk. Factors that increase the risk for postoperative pulmonary complications are age, obesity, asthma (well controlled currently). Pre op CXR today stable with clear lungs.   Respiratory complications generally occur in 1% of ASA Class I patients, 5% of ASA Class II and 10% of ASA Class III-IV patients These complications rarely result in mortality  and include postoperative pneumonia, atelectasis, pulmonary embolism, ARDS and increased time requiring postoperative mechanical ventilation.   Overall, I recommend proceeding with the surgery if the risk for respiratory complications are outweighed by the potential benefits. This will need to be discussed between the patient and surgeon.   To reduce risks of respiratory complications, I recommend: --Pre- and post-operative incentive spirometry performed frequently while awake --Inpatient use of currently prescribed inhalers --Short duration of surgery as much as possible and avoid paralytic if possible --OOB, encourage mobility post-op, DVT prophylaxis  1) RISK FOR PROLONGED MECHANICAL VENTILAION - > 48h  1A) Arozullah - Prolonged mech ventilation risk Arozullah Postperative Pulmonary Risk Score - for mech ventilation dependence >48h [employer], [facility] 2000, major non-cardiac surgery) Comment Score  Type of surgery - abd ao aneurysm (27), thoracic (21), neurosurgery / upper abdominal / vascular (21), neck (11) R TKA 0  Emergency Surgery - (11)  0  ALbumin < 3 or poor nutritional state - (9)  0  BUN > 30 -  (8)  0  Partial or completely dependent functional status - (7)  0  COPD -  (6) asthma 6  Age - 60 to 2 (4), > 70  (6) 61 4  TOTAL  10  Risk Stratifcation scores  - < 10 (0.5%), 11-19 (1.8%), 20-27 (4.2%),  28-40 (10.1%), >40 (26.6%)  0.5%    I spent 35 minutes of dedicated to the care of this patient on the date of this encounter to include pre-visit review of records, face-to-face time with the patient discussing conditions above, post visit ordering of testing, clinical documentation with the electronic health record, making appropriate referrals as documented, and communicating necessary findings to members of the patients care team.  Clayton Bibles, NP 10/13/2021  Pt aware and understands NP's role.

## 2021-10-13 NOTE — Assessment & Plan Note (Addendum)
Mild to moderate risk. Factors that increase the risk for postoperative pulmonary complications are age, obesity, asthma (well controlled currently). Pre op CXR today stable with clear lungs.   Respiratory complications generally occur in 1% of ASA Class I patients, 5% of ASA Class II and 10% of ASA Class III-IV patients These complications rarely result in mortality and include postoperative pneumonia, atelectasis, pulmonary embolism, ARDS and increased time requiring postoperative mechanical ventilation.   Overall, I recommend proceeding with the surgery if the risk for respiratory complications are outweighed by the potential benefits. This will need to be discussed between the patient and surgeon.   To reduce risks of respiratory complications, I recommend: --Pre- and post-operative incentive spirometry performed frequently while awake --Inpatient use of currently prescribed inhalers --Short duration of surgery as much as possible and avoid paralytic if possible --OOB, encourage mobility post-op, DVT prophylaxis  1) RISK FOR PROLONGED MECHANICAL VENTILAION - > 48h  1A) Arozullah - Prolonged mech ventilation risk Arozullah Postperative Pulmonary Risk Score - for mech ventilation dependence >48h [employer], [facility] 2000, major non-cardiac surgery) Comment Score  Type of surgery - abd ao aneurysm (27), thoracic (21), neurosurgery / upper abdominal / vascular (21), neck (11) R TKA 0  Emergency Surgery - (11)  0  ALbumin < 3 or poor nutritional state - (9)  0  BUN > 30 -  (8)  0  Partial or completely dependent functional status - (7)  0  COPD -  (6) asthma 6  Age - 60 to 69 (4), > 70  (6) 61 4  TOTAL  10  Risk Stratifcation scores  - < 10 (0.5%), 11-19 (1.8%), 20-27 (4.2%), 28-40 (10.1%), >40 (26.6%)  0.5%

## 2021-10-15 NOTE — Telephone Encounter (Signed)
OV notes and clearance form have been faxed back to [facility]. Nothing further needed at this time. ?

## 2021-10-28 ENCOUNTER — Other Ambulatory Visit: Payer: Self-pay

## 2021-10-28 MED ORDER — ALENDRONATE SODIUM 35 MG PO TABS
35.0000 mg | ORAL_TABLET | ORAL | 0 refills | Status: DC
Start: 2021-10-28 — End: 2021-11-20

## 2021-11-11 NOTE — Patient Instructions (Signed)

## 2021-11-11 NOTE — Progress Notes (Unsigned)
Subjective:   [name] is a 61 y.o. female who presents for Medicare Annual (Subsequent) preventive examination.  I connected with  [name] on 11/14/21 by an audio only telemedicine application and verified that I am speaking with the correct person using two identifiers.   I discussed the limitations, risks, security and privacy concerns of performing an evaluation and management service by telephone and the availability of in person appointments. I also discussed with the patient that there may be a patient responsible charge related to this service. The patient expressed understanding and verbally consented to this telephonic visit.  Location of Patient: Home Location of Provider: office  List any persons and their role that are participating in the visit with the patient.   Shirl Harris [place]  Review of Systems    Defer to PCP Cardiac Risk Factors include: advanced age (>46mn, >>76women);hypertension;sedentary lifestyle     Objective:    Today's Vitals   11/14/21 0956  PainSc: 6    There is no height or weight on file to calculate BMI.     11/14/2021    9:55 AM 11/12/2020    9:19 AM 09/27/2018    6:00 PM 09/21/2018    9:26 AM  Advanced Directives  Does Patient Have a Medical Advance Directive? Yes Yes Yes Yes  Type of AParamedicof [place] will Living will [place] will [place] will  Does patient want to make changes to medical advance directive? No - Patient declined  No - Patient declined No - Patient declined  Copy of [place] Chart? No - copy requested  No - copy requested No - copy requested    Current Medications (verified) Outpatient Encounter Medications as of 11/14/2021  Medication Sig   ADVAIR DISKUS 100-50 MCG/ACT AEPB USE 1 INHALATION TWO TIMES A DAY THEN RINSE MOUTH   albuterol (PROVENTIL) (2.5 MG/3ML) 0.083%  nebulizer solution Take 3 mLs (2.5 mg total) by nebulization every 6 (six) hours as needed for wheezing or shortness of breath.   albuterol (VENTOLIN HFA) 108 (90 Base) MCG/ACT inhaler Inhale 1-2 puffs into the lungs every 6 (six) hours as needed for shortness of breath.   alendronate (FOSAMAX) 35 MG tablet Take 1 tablet (35 mg total) by mouth every 7 (seven) days. Take with a full glass of water on an empty stomach.   ALPRAZolam (XANAX) 0.5 MG tablet Take 0.5 mg by mouth 2 (two) times daily as needed for anxiety.   amphetamine-dextroamphetamine (ADDERALL XR) 30 MG 24 hr capsule Take 30 mg by mouth daily.   Cholecalciferol (VITAMIN D-3) 5000 units TABS Take 5,000 Units by mouth daily.   FLUoxetine (PROZAC) 20 MG capsule Take 20 mg by mouth daily.   hydrochlorothiazide (HYDRODIURIL) 25 MG tablet Take 1 tablet (25 mg total) by mouth daily.   MAGNESIUM-ZINC PO Take by mouth.   montelukast (SINGULAIR) 10 MG tablet Take 1 tablet (10 mg total) by mouth at bedtime.   OLANZapine-Samidorphan (LYBALVI) 5-10 MG TABS Take by mouth.   potassium chloride SA (KLOR-CON M) 20 MEQ tablet Take 1 tablet (20 mEq total) by mouth 2 (two) times daily.   PSYLLIUM HUSK PO    Red Yeast Rice Extract (RED YEAST RICE PO)    TURMERIC PO 1,650 mg.   No facility-administered encounter medications on file as of 11/14/2021.    Allergies (verified) Patient has no known allergies.   History: Past Medical  History:  Diagnosis Date   Anxiety    psychiatrist: Dr. Robina Ade    Arthritis    Colitis    Colon polyps    COPD (chronic obstructive pulmonary disease) ([place])    chronic bronchitis- nonsmoker, more asthma/RAD   COVID-19 09/2020   Depression    psychiatrist: Dr. Robina Ade   Elevated liver enzymes    Dr. Rulon Abide following   Fatty liver    GERD (gastroesophageal reflux disease)    Heart murmur    History of chicken pox    History of kidney stones    Primary osteoarthritis of both knees 04/09/2011   Formatting of this note  might be different from the original. Overview:  S/p left total knee replacement   Renal cell carcinoma    hx of left and right; Dr. Nena Alexander   Past Surgical History:  Procedure Laterality Date   CHOLECYSTECTOMY     FETAL RADIO FREQUENCY ABLATION     FRACTURE SURGERY Left    ankle   JOINT REPLACEMENT Left 2009   knee   NEPHRECTOMY     partial LT   OPEN SURGICAL REPAIR OF GLUTEAL TENDON Left 09/27/2018   Procedure: Left hip bursectomy; gluteal tendon repair;  Surgeon: Gaynelle Arabian, MD;  Location: [facility];  Service: Orthopedics;  Laterality: Left;  30mn   TONSILLECTOMY     Family History  Problem Relation Age of Onset   Hypertension Mother    Alzheimer's disease Mother    Arthritis Mother    Hypertension Father    Alzheimer's disease Father    COPD Father    Heart disease Father    Arthritis Father    Hypertension Sister    Social History   Socioeconomic History   Marital status: Married    Spouse name: Not on file   Number of children: Not on file   Years of education: Not on file   Highest education level: Associate degree: occupational, tHotel manager or vocational program  Occupational History   Not on file  Tobacco Use   Smoking status: Never    Passive exposure: Never   Smokeless tobacco: Never  Vaping Use   Vaping Use: Never used  Substance and Sexual Activity   Alcohol use: No   Drug use: No   Sexual activity: Yes  Other Topics Concern   Not on file  Social History Narrative   Married. 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   Lives with her husband, son and granddaughter.   Drinks caffeinated beverages.   Wears her seatbelt, wears a bicycle helmet, there is a smoke detector in home. There are no firearms in the home.   Patient feels safe in her relationships.   Social Determinants of Health   Financial Resource Strain: Low Risk  (11/14/2021)   Overall Financial Resource Strain (CARDIA)    Difficulty of Paying Living  Expenses: Not hard at all  Food Insecurity: No Food Insecurity (11/14/2021)   Hunger Vital Sign    Worried About Running Out of Food in the Last Year: Never true    Ran Out of Food in the Last Year: Never true  Transportation Needs: No Transportation Needs (11/14/2021)   PRAPARE - THydrologist(Medical): No    Lack of Transportation (Non-Medical): No  Physical Activity: Inactive (11/14/2021)   Exercise Vital Sign    Days of Exercise per Week: 0 days    Minutes of Exercise per Session: 30  min  Stress: No Stress Concern Present (11/14/2021)   [place]    Feeling of Stress : Not at all  Social Connections: [place] (11/14/2021)   Social Connection and Isolation Panel [NHANES]    Frequency of Communication with Friends and Family: More than three times a week    Frequency of Social Gatherings with Friends and Family: Patient refused    Attends Religious Services: More than 4 times per year    Active Member of [employer] or Organizations: Patient refused    Attends Music therapist: More than 4 times per year    Marital Status: Married    Tobacco Counseling Counseling given: Yes   Clinical Intake:  Pre-visit preparation completed: Yes  Pain : 0-10 Pain Score: 6  Pain Type: Chronic pain Pain Location: Knee Pain Orientation: Right     Nutritional Risks: None Diabetes: No  How often do you need to have someone help you when you read instructions, pamphlets, or other written materials from your doctor or pharmacy?: 1 - Never What is the last grade level you completed in school?: 12  Diabetic?no  Interpreter Needed?: No  Information entered by :: Toria C., RMA   Activities of Daily Living    11/14/2021    9:56 AM  In your present state of health, do you have any difficulty performing the following activities:  Hearing? 0  Vision? 0  Difficulty concentrating  or making decisions? 0  Walking or climbing stairs? 0  Dressing or bathing? 0  Doing errands, shopping? 0  Preparing Food and eating ? N  Using the Toilet? N  In the past six months, have you accidently leaked urine? N  Do you have problems with loss of bowel control? N  Managing your Medications? N  Housekeeping or managing your Housekeeping? N    Patient Care Team: Ma Hillock, DO as PCP - General (Family Medicine) Gwendel Hanson, MD as Referring Physician (Urology) Aviva Signs, MD as Referring Physician (Gastroenterology) Deneise Lever, MD as Consulting Physician (Pulmonary Disease) Brien Few, MD as Consulting Physician (Obstetrics and Gynecology)  Indicate any recent Medical Services you may have received from other than [facility] providers in the past year (date may be approximate).     Assessment:   This is a routine wellness examination for [facility].  Hearing/Vision screen No results found.  Dietary issues and exercise activities discussed: Exercise limited by: orthopedic condition(s)   Goals Addressed   None    Depression Screen    11/14/2021   10:10 AM 11/12/2020    9:18 AM 06/27/2020    1:10 PM 06/27/2019   10:23 AM 12/15/2018    9:53 AM 06/13/2018   10:44 AM 04/08/2017   10:00 AM  PHQ 2/9 Scores  PHQ - 2 Score 0 0 0 0 0 2 0  PHQ- 9 Score   0 0 0 11     Fall Risk    11/14/2021   10:10 AM 11/12/2020    9:05 AM 03/24/2016    1:12 PM  [place] in the past year? 0 0 No  Number falls in past yr: 0 0   Injury with Fall? 0 0   Follow up Falls evaluation completed Falls evaluation completed;Falls prevention discussed     FALL RISK PREVENTION PERTAINING TO THE HOME:  Any stairs in or around the home? Yes  If so, are there any without handrails? Yes  Home free of loose throw rugs in walkways, pet beds, electrical cords, etc? Yes  Adequate lighting in your home to reduce risk of falls? Yes   ASSISTIVE DEVICES UTILIZED TO PREVENT  FALLS:  Life alert? No  Use of a cane, walker or w/c? No  Grab bars in the bathroom? No  Shower chair or bench in shower? No  Elevated toilet seat or a handicapped toilet? Yes   TIMED UP AND GO:  Was the test performed?  n/a .  Length of time to ambulate 10 feet: n/a sec.    Cognitive Function:        11/14/2021   10:14 AM  6CIT Screen  What Year? 0 points  What month? 0 points  What time? 0 points  Count back from 20 0 points  Months in reverse 0 points  Repeat phrase 0 points  Total Score 0 points    Immunizations Immunization History  Administered Date(s) Administered   Influenza Split 01/24/2012, 01/06/2013, 01/03/2014, 12/16/2014, 01/15/2015   Influenza,inj,Quad PF,6+ Mos 12/16/2014, 01/06/2016, 11/25/2016, 12/15/2017, 12/15/2018, 01/07/2020, 12/15/2020   Influenza-Unspecified 12/16/2014, 01/06/2016, 11/25/2016, 12/15/2017, 12/15/2018, 01/07/2020   PFIZER(Purple Top)SARS-COV-2 Vaccination 06/21/2019, 07/12/2019, 01/15/2020, 08/26/2020, 12/30/2020   Pneumococcal Conjugate-13 03/24/2015   Pneumococcal Polysaccharide-23 12/17/2013   Td 09/03/2020   Tdap 03/24/2015    TDAP status: Up to date  Flu Vaccine status: Up to date  Pneumococcal vaccine status: Due, Education has been provided regarding the importance of this vaccine. Advised may receive this vaccine at local pharmacy or Health Dept. Aware to provide a copy of the vaccination record if obtained from local pharmacy or Health Dept. Verbalized acceptance and understanding.  Covid-19 vaccine status: Completed vaccines  Qualifies for Shingles Vaccine? Yes   Zostavax completed No   Shingrix Completed?: No.    Education has been provided regarding the importance of this vaccine. Patient has been advised to call insurance company to determine out of pocket expense if they have not yet received this vaccine. Advised may also receive vaccine at local pharmacy or Health Dept. Verbalized acceptance and  understanding.  Screening Tests Health Maintenance  Topic Date Due   Zoster Vaccines- Shingrix (1 of 2) Never done   COVID-19 Vaccine (6 - Pfizer risk series) 11/30/2021 (Originally 02/24/2021)   INFLUENZA VACCINE  07/04/2022 (Originally 11/03/2021)   MAMMOGRAM  09/19/2023   DEXA SCAN  09/03/2024   PAP SMEAR-Modifier  07/29/2025   COLONOSCOPY (Pts 45-40yr Insurance coverage will need to be confirmed)  02/23/2026   TETANUS/TDAP  09/04/2030   Hepatitis C Screening  Completed   HIV Screening  Completed   HPV VACCINES  Aged Out    Health Maintenance  Health Maintenance Due  Topic Date Due   Zoster Vaccines- Shingrix (1 of 2) Never done    Colorectal cancer screening: Type of screening: Colonoscopy. Completed 02/24/2016. Repeat every 5 years  Mammogram status: Completed 09/18/2021. Repeat every year  Bone Density status: Completed 09/03/21. Results reflect: Bone density results: OSTEOPENIA. Repeat every 3 years.  Lung Cancer Screening: (Low Dose CT Chest recommended if Age 61-80years, 30 pack-year currently smoking OR have quit w/in 15years.) does not qualify.   Lung Cancer Screening Referral: n/a  Additional Screening:  Hepatitis C Screening: does qualify; Completed 03/24/2015  Vision Screening: Recommended annual ophthalmology exams for early detection of glaucoma and other disorders of the eye. Is the patient up to date with their annual eye exam?  Yes  Who is the provider or what is the name of  the office in which the patient attends annual eye exams? Dr. Sabra Heck [facility] If pt is not established with a provider, would they like to be referred to a provider to establish care? No .   Dental Screening: Recommended annual dental exams for proper oral hygiene  Community Resource Referral / Chronic Care Management: CRR required this visit?  No   CCM required this visit?  No      Plan:     I have personally reviewed and noted the following in the patient's chart:    Medical and social history Use of alcohol, tobacco or illicit drugs  Current medications and supplements including opioid prescriptions.  Functional ability and status Nutritional status Physical activity Advanced directives List of other physicians Hospitalizations, surgeries, and ER visits in previous 12 months Vitals Screenings to include cognitive, depression, and falls Referrals and appointments  In addition, I have reviewed and discussed with patient certain preventive protocols, quality metrics, and best practice recommendations. A written personalized care plan for preventive services as well as general preventive health recommendations were provided to patient.     Kavin Leech, RMA  Nurse Notes: Non face to face 15 minutes.  Ms. Kindig , Thank you for taking time to come for your Medicare Wellness Visit. I appreciate your ongoing commitment to your health goals. Please review the following plan we discussed and let me know if I can assist you in the future.   These are the goals we discussed:  Goals      Patient Stated     Would like to loose 20 lbs        This is a list of the screening recommended for you and due dates:  Health Maintenance  Topic Date Due   Zoster (Shingles) Vaccine (1 of 2) Never done   COVID-19 Vaccine (6 - Pfizer risk series) 11/30/2021*   Flu Shot  07/04/2022*   Mammogram  09/19/2023   DEXA scan (bone density measurement)  09/03/2024   Pap Smear  07/29/2025   Colon Cancer Screening  02/23/2026   Tetanus Vaccine  09/04/2030   Hepatitis C Screening: USPSTF Recommendation to screen - Ages 18-79 yo.  Completed   HIV Screening  Completed   HPV Vaccine  Aged Out  *Topic was postponed. The date shown is not the original due date.

## 2021-11-14 ENCOUNTER — Ambulatory Visit (INDEPENDENT_AMBULATORY_CARE_PROVIDER_SITE_OTHER): Payer: Medicare Other

## 2021-11-14 DIAGNOSIS — Z1231 Encounter for screening mammogram for malignant neoplasm of breast: Secondary | ICD-10-CM | POA: Diagnosis not present

## 2021-11-14 DIAGNOSIS — Z0000 Encounter for general adult medical examination without abnormal findings: Secondary | ICD-10-CM

## 2021-11-20 ENCOUNTER — Ambulatory Visit (INDEPENDENT_AMBULATORY_CARE_PROVIDER_SITE_OTHER): Payer: Medicare Other | Admitting: Family Medicine

## 2021-11-20 ENCOUNTER — Encounter: Payer: Self-pay | Admitting: Family Medicine

## 2021-11-20 VITALS — BP 111/67 | HR 69 | Temp 98.4°F | Ht 61.0 in | Wt 188.0 lb

## 2021-11-20 DIAGNOSIS — I10 Essential (primary) hypertension: Secondary | ICD-10-CM | POA: Diagnosis not present

## 2021-11-20 DIAGNOSIS — Z85528 Personal history of other malignant neoplasm of kidney: Secondary | ICD-10-CM | POA: Diagnosis not present

## 2021-11-20 DIAGNOSIS — E782 Mixed hyperlipidemia: Secondary | ICD-10-CM | POA: Diagnosis not present

## 2021-11-20 DIAGNOSIS — E559 Vitamin D deficiency, unspecified: Secondary | ICD-10-CM | POA: Diagnosis not present

## 2021-11-20 DIAGNOSIS — M85851 Other specified disorders of bone density and structure, right thigh: Secondary | ICD-10-CM

## 2021-11-20 DIAGNOSIS — F32A Depression, unspecified: Secondary | ICD-10-CM

## 2021-11-20 DIAGNOSIS — Z23 Encounter for immunization: Secondary | ICD-10-CM

## 2021-11-20 DIAGNOSIS — F419 Anxiety disorder, unspecified: Secondary | ICD-10-CM

## 2021-11-20 MED ORDER — HYDROCHLOROTHIAZIDE 25 MG PO TABS: 25.0000 mg | ORAL_TABLET | Freq: Every day | ORAL | 1 refills | Status: DC

## 2021-11-20 MED ORDER — POTASSIUM CHLORIDE CRYS ER 20 MEQ PO TBCR: 20.0000 meq | EXTENDED_RELEASE_TABLET | Freq: Two times a day (BID) | ORAL | 4 refills | Status: DC

## 2021-11-20 NOTE — Patient Instructions (Addendum)
Return in about 24 weeks (around 05/07/2022) for Routine chronic condition follow-up.        Great to see you today.  I have refilled the medication(s) we provide.   If labs were collected, we will inform you of lab results once received either by echart message or telephone call.   - echart message- for normal results that have been seen by the patient already.   - telephone call: abnormal results or if patient has not viewed results in their echart.

## 2021-11-20 NOTE — Progress Notes (Signed)
Patient ID: [name], female  DOB: 1960/06/03, 61 y.o.   MRN: [id number] Patient Care Team    Relationship Specialty Notifications Start End  Ma Hillock, DO PCP - General Family Medicine  03/05/15   Gwendel Hanson, MD Referring Physician Urology  04/08/17   Aviva Signs, MD Referring Physician Gastroenterology  04/08/17   Deneise Lever, MD Consulting Physician Pulmonary Disease  04/08/17   Brien Few, MD Consulting Physician Obstetrics and Gynecology  06/05/21     Chief Complaint  Patient presents with   Hypertension    Cmc; pt is not fasting    Subjective: [name] is a 61 y.o.  Female  present for [facility] follow up Hypertension/hyperlipidemia/morbid obesity- bmi >30/hypok: Pt reports compliance with HCTZ 25 mg QD.Marland Kitchen Patient denies chest pain, shortness of breath, dizziness or lower extremity edema.  Pt does not take a  daily baby ASA. Pt is not prescribed statin. She did start red yeast rice and psyllium supplements. Diet: Cutting out soda, sugar Exercise: routinely.  RF: HTN, HLD, obesity, FHx HD   H/O RCC:  Pt has a h/o RCC left kidney with partial nephrectomy and RCC right kidney with cryoablation.  Since her last visit she has undergone some more changes with her renal cell carcinoma. She is following with specialties routinely every 6 months for now.      11/14/2021   10:10 AM 11/12/2020    9:18 AM 06/27/2020    1:10 PM 06/27/2019   10:23 AM 12/15/2018    9:53 AM  Depression screen PHQ 2/9  Decreased Interest 0 0 0 0 0  Down, Depressed, Hopeless 0 0 0 0 0  PHQ - 2 Score 0 0 0 0 0  Altered sleeping   0 0 0  Tired, decreased energy   0 0 0  Change in appetite   0 0 0  Feeling bad or failure about yourself    0 0 0  Trouble concentrating   0 0 0  Moving slowly or fidgety/restless   0 0 0  Suicidal thoughts   0 0 0  PHQ-9 Score   0 0 0  Difficult doing work/chores    Not difficult at all Not difficult at all      06/27/2019    10:23 AM 12/15/2018    9:53 AM 06/13/2018   10:45 AM  GAD 7 : Generalized Anxiety Score  Nervous, Anxious, on Edge 0 0 2  Control/stop worrying 0 0 1  Worry too much - different things 0 0 1  Trouble relaxing 0 0 0  Restless 0 0 0  Easily annoyed or irritable 0 0 2  Afraid - awful might happen 0 0 1  Total GAD 7 Score 0 0 7  Anxiety Difficulty Not difficult at all Not difficult at all Not difficult at all    Immunization History  Administered Date(s) Administered   Influenza Split 01/24/2012, 01/06/2013, 01/03/2014, 12/16/2014, 01/15/2015   Influenza,inj,Quad PF,6+ Mos 12/16/2014, 01/06/2016, 11/25/2016, 12/15/2017, 12/15/2018, 01/07/2020, 12/15/2020   Influenza-Unspecified 12/16/2014, 01/06/2016, 11/25/2016, 12/15/2017, 12/15/2018, 01/07/2020   PFIZER(Purple Top)SARS-COV-2 Vaccination 06/21/2019, 07/12/2019, 01/15/2020, 08/26/2020, 12/30/2020   Pneumococcal Conjugate-13 03/24/2015   Pneumococcal Polysaccharide-23 12/17/2013   Td 09/03/2020   Tdap 03/24/2015     Past Medical History:  Diagnosis Date   Anxiety    psychiatrist: Dr. Robina Ade    Arthritis    Colitis    Colon polyps    COPD (chronic obstructive pulmonary disease) (  [place])    chronic bronchitis- nonsmoker, more asthma/RAD   COVID-19 09/2020   Depression    psychiatrist: Dr. Robina Ade   Elevated liver enzymes    Dr. Rulon Abide following   Fatty liver    GERD (gastroesophageal reflux disease)    Heart murmur    History of chicken pox    History of kidney stones    Primary osteoarthritis of both knees 04/09/2011   Formatting of this note might be different from the original. Overview:  S/p left total knee replacement   Renal cell carcinoma    hx of left and right; Dr. Nena Alexander   Allergies  Allergen Reactions   Fosamax [Alendronate] Other (See Comments)    Severe GERD   Past Surgical History:  Procedure Laterality Date   CHOLECYSTECTOMY     FETAL RADIO FREQUENCY ABLATION     FRACTURE SURGERY Left    ankle   JOINT  REPLACEMENT Left 2009   knee   NEPHRECTOMY     partial LT   OPEN SURGICAL REPAIR OF GLUTEAL TENDON Left 09/27/2018   Procedure: Left hip bursectomy; gluteal tendon repair;  Surgeon: Gaynelle Arabian, MD;  Location: [facility];  Service: Orthopedics;  Laterality: Left;  33mn   TONSILLECTOMY     Family History  Problem Relation Age of Onset   Hypertension Mother    Alzheimer's disease Mother    Arthritis Mother    Hypertension Father    Alzheimer's disease Father    COPD Father    Heart disease Father    Arthritis Father    Hypertension Sister    Social History   Social History Narrative   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   Lives with her husband, son and granddaughter.   Drinks caffeinated beverages.   Wears her seatbelt, wears a bicycle helmet, there is a smoke detector in home. There are no firearms in the home.   Patient feels safe in her relationships.    Allergies as of 11/20/2021       Reactions   Fosamax [alendronate] Other (See Comments)   Severe GERD        Medication List        Accurate as of November 20, 2021 10:44 AM. If you have any questions, ask your nurse or doctor.          STOP taking these medications    alendronate 35 MG tablet Commonly known as: FOSAMAX Stopped by: RHoward Pouch DO       TAKE these medications    Advair Diskus 100-50 MCG/ACT Aepb Generic drug: fluticasone-salmeterol USE 1 INHALATION TWO TIMES A DAY THEN RINSE MOUTH   albuterol (2.5 MG/3ML) 0.083% nebulizer solution Commonly known as: PROVENTIL Take 3 mLs (2.5 mg total) by nebulization every 6 (six) hours as needed for wheezing or shortness of breath.   albuterol 108 (90 Base) MCG/ACT inhaler Commonly known as: VENTOLIN HFA Inhale 1-2 puffs into the lungs every 6 (six) hours as needed for shortness of breath.   ALPRAZolam 0.5 MG tablet Commonly known as: XANAX Take 0.5 mg by mouth 2 (two) times daily as needed for  anxiety.   amphetamine-dextroamphetamine 30 MG 24 hr capsule Commonly known as: ADDERALL XR Take 30 mg by mouth daily.   FLUoxetine 20 MG capsule Commonly known as: PROZAC Take 20 mg by mouth daily.   hydrochlorothiazide 25 MG tablet Commonly known as: HYDRODIURIL Take 1 tablet (25 mg total) by mouth daily.  Lybalvi 5-10 MG Tabs Generic drug: OLANZapine-Samidorphan Take by mouth.   MAGNESIUM-ZINC PO Take by mouth.   montelukast 10 MG tablet Commonly known as: SINGULAIR Take 1 tablet (10 mg total) by mouth at bedtime.   potassium chloride SA 20 MEQ tablet Commonly known as: KLOR-CON M Take 1 tablet (20 mEq total) by mouth 2 (two) times daily.   PSYLLIUM HUSK PO   RED YEAST RICE PO   TURMERIC PO 1,650 mg.   Vitamin D-3 125 MCG (5000 UT) Tabs Take 5,000 Units by mouth daily.        All past medical history, surgical history, allergies, family history, immunizations andmedications were updated in the EMR today and reviewed under the history and medication portions of their EMR.      ROS: 14 pt review of systems performed and negative (unless mentioned in an HPI)  Objective: BP 111/67   Pulse 69   Temp 98.4 F (36.9 C) (Oral)   Ht '5\' 1"'$  (1.549 m)   Wt 188 lb (85.3 kg)   LMP 04/03/2013   SpO2 97%   BMI 35.52 kg/m  Physical Exam Vitals and nursing note reviewed.  Constitutional:      General: She is not in acute distress.    Appearance: Normal appearance. She is not ill-appearing, toxic-appearing or diaphoretic.  HENT:     Head: Normocephalic and atraumatic.  Eyes:     General: No scleral icterus.       Right eye: No discharge.        Left eye: No discharge.     Extraocular Movements: Extraocular movements intact.     Conjunctiva/sclera: Conjunctivae normal.     Pupils: Pupils are equal, round, and reactive to light.  Cardiovascular:     Rate and Rhythm: Normal rate and regular rhythm.     Heart sounds: No murmur heard. Pulmonary:     Effort:  Pulmonary effort is normal. No respiratory distress.     Breath sounds: Normal breath sounds. No wheezing, rhonchi or rales.  Musculoskeletal:     Cervical back: Neck supple. No tenderness.     Right lower leg: No edema.     Left lower leg: No edema.  Lymphadenopathy:     Cervical: No cervical adenopathy.  Skin:    General: Skin is warm and dry.     Coloration: Skin is not jaundiced or pale.     Findings: No erythema or rash.  Neurological:     Mental Status: She is alert and oriented to person, place, and time. Mental status is at baseline.     Motor: No weakness.     Gait: Gait normal.  Psychiatric:        Mood and Affect: Mood normal.        Behavior: Behavior normal.        Thought Content: Thought content normal.        Judgment: Judgment normal.     No results found.  Assessment/plan: Arianie Couse is a 61 y.o. female present for [facility] Anxiety and depression Follows w/ psych  Essential hypertension, benign/Hyperlipidemia, unspecified hyperlipidemia type/Morbid obesity (HCC)/hypokalemia stable - continue   HCTZ 25 mg -continue  Kdur BID . - Goal LDL 100-130.  She will think about statin use if not at goal.  We also discussed Zetia as a potential.  Lengthy discussion today surrounding her concern of statins and her renal function. -For now continue red yeast rice and psyllium supplements.   Labs utd  Vitamin  D deficiency/osteopneia - vit d UTD -DEXA UTD  01/02/2021 - supplementing vit d Pt dcd fosamax 35 weekly> severe GERD   Return in about 24 weeks (around 05/07/2022) for Routine chronic condition follow-up.   No orders of the defined types were placed in this encounter.   No orders of the defined types were placed in this encounter.   Referral Orders  No referral(s) requested today     Electronically signed by: Howard Pouch, [place]

## 2021-12-01 NOTE — H&P (Cosign Needed Addendum)
TOTAL KNEE ADMISSION H&P  Patient is being admitted for right total knee arthroplasty.  Subjective:  Chief Complaint: Right knee pain.  HPI: [name], 61 y.o. female has a history of pain and functional disability in the right knee due to arthritis and has failed non-surgical conservative treatments for greater than 12 weeks to include NSAID's and/or analgesics, corticosteriod injections, and activity modification. Onset of symptoms was gradual, starting several years ago with gradually worsening course since that time. The patient noted no past surgery on the right knee.  Patient currently rates pain in the right knee at 7 out of 10 with activity. Patient has night pain, worsening of pain with activity and weight bearing, and pain that interferes with activities of daily living. Patient has evidence of  bone-on-bone arthritis in the medial and patellofemoral compartments of the right knee with varus deformity  by imaging studies. There is no active infection.  Patient Active Problem List   Diagnosis Date Noted   Mild intermittent asthma 10/13/2021   Osteoarthritis of right knee 07/29/2021   BMI 32.0-32.9,adult 12/15/2020   Postmenopausal bleeding 06/27/2020   Osteopenia of neck of right femur 05/14/2016   Vitamin D deficiency 03/24/2016   Elevated hemoglobin A1c 09/22/2015   Laryngopharyngeal reflux (LPR) 08/13/2015   Abnormal ear exam 07/28/2015   Hyperlipidemia 03/05/2015   Anxiety and depression 03/05/2015   Essential hypertension, benign 03/05/2015   Morbid obesity ([place]) 03/05/2015   History of renal cell cancer 03/05/2015   Hepatic steatosis 03/05/2015   Abnormal levels of other serum enzymes 10/23/2014   Multiple pulmonary nodules determined by computed tomography of lung 03/07/2014   Allergy status to unspecified drugs, medicaments and biological substances status 04/09/2011    Past Medical History:  Diagnosis Date   Anxiety    psychiatrist: Dr. Robina Ade     Arthritis    Colitis    Colon polyps    COVID-19 09/2020   Depression    psychiatrist: Dr. Robina Ade   Elevated liver enzymes    Dr. Rulon Abide following   Fatty liver    GERD (gastroesophageal reflux disease)    Heart murmur    History of chicken pox    History of kidney stones    Primary osteoarthritis of both knees 04/09/2011   Formatting of this note might be different from the original. Overview:  S/p left total knee replacement   Renal cell carcinoma    hx of left and right; Dr. Nena Alexander   Trochanteric bursitis, left hip 09/27/2018    Past Surgical History:  Procedure Laterality Date   CHOLECYSTECTOMY     FETAL RADIO FREQUENCY ABLATION     FRACTURE SURGERY Left    ankle   JOINT REPLACEMENT Left 2009   knee   NEPHRECTOMY     partial LT   OPEN SURGICAL REPAIR OF GLUTEAL TENDON Left 09/27/2018   Procedure: Left hip bursectomy; gluteal tendon repair;  Surgeon: Gaynelle Arabian, MD;  Location: [facility];  Service: Orthopedics;  Laterality: Left;  68mn   TONSILLECTOMY      Prior to Admission medications   Medication Sig Start Date End Date Taking? Authorizing Provider  ADVAIR DISKUS 100-50 MCG/ACT AEPB USE 1 INHALATION TWO TIMES A DAY THEN RINSE MOUTH 04/23/21   YBaird LyonsD, MD  albuterol (PROVENTIL) (2.5 MG/3ML) 0.083% nebulizer solution Take 3 mLs (2.5 mg total) by nebulization every 6 (six) hours as needed for wheezing or shortness of breath. 06/01/18   YDeneise Lever MD  albuterol (VENTOLIN  HFA) 108 (90 Base) MCG/ACT inhaler Inhale 1-2 puffs into the lungs every 6 (six) hours as needed for shortness of breath. 09/04/21   Deneise Lever, MD  ALPRAZolam Duanne Moron) 0.5 MG tablet Take 0.5 mg by mouth 2 (two) times daily as needed for anxiety.    [provider]  amphetamine-dextroamphetamine (ADDERALL XR) 30 MG 24 hr capsule Take 30 mg by mouth daily. 09/27/19   [provider]  Cholecalciferol (VITAMIN D-3) 5000 units TABS Take 5,000 Units by mouth daily.    [provider]  FLUoxetine (PROZAC) 20 MG capsule Take 20 mg by mouth daily. 05/04/21   [provider]  hydrochlorothiazide (HYDRODIURIL) 25 MG tablet Take 1 tablet (25 mg total) by mouth daily. 11/20/21   Kuneff, Renee A, DO  MAGNESIUM-ZINC PO Take by mouth.    [provider]  montelukast (SINGULAIR) 10 MG tablet Take 1 tablet (10 mg total) by mouth at bedtime. 10/13/21   Cobb, Karie Schwalbe, NP  OLANZapine-Samidorphan (LYBALVI) 5-10 MG TABS Take by mouth.    [provider]  potassium chloride SA (KLOR-CON M) 20 MEQ tablet Take 1 tablet (20 mEq total) by mouth 2 (two) times daily. 11/20/21   Raoul Pitch, Renee A, DO  PSYLLIUM HUSK PO  10/12/20   [provider]  Red Yeast Rice Extract (RED YEAST RICE PO)  10/12/20   [provider]  TURMERIC PO 1,650 mg. 10/12/20   [provider]    Allergies  Allergen Reactions   Fosamax [Alendronate] Other (See Comments)    Severe GERD    Social History   Socioeconomic History   Marital status: Married    Spouse name: Not on file   Number of children: Not on file   Years of education: Not on file   Highest education level: Associate degree: occupational, Hotel manager, or vocational program  Occupational History   Not on file  Tobacco Use   Smoking status: Never    Passive exposure: Never   Smokeless tobacco: Never  Vaping Use   Vaping Use: Never used  Substance and Sexual Activity   Alcohol use: No   Drug use: No   Sexual activity: Yes  Other Topics Concern   Not on file  Social History Narrative   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   Lives with her husband, son and granddaughter.   Drinks caffeinated beverages.   Wears her seatbelt, wears a bicycle helmet, there is a smoke detector in home. There are no firearms in the home.   Patient feels safe in her relationships.   Social Determinants of Health   Financial Resource Strain: Low Risk   (11/14/2021)   Overall Financial Resource Strain (CARDIA)    Difficulty of Paying Living Expenses: Not hard at all  Food Insecurity: No Food Insecurity (11/14/2021)   Hunger Vital Sign    Worried About Running Out of Food in the Last Year: Never true    Ran Out of Food in the Last Year: Never true  Transportation Needs: No Transportation Needs (11/14/2021)   PRAPARE - Hydrologist (Medical): No    Lack of Transportation (Non-Medical): No  Physical Activity: Inactive (11/14/2021)   Exercise Vital Sign    Days of Exercise per Week: 0 days    Minutes of Exercise per Session: 30 min  Stress: No Stress Concern Present (11/14/2021)   [place]  Questionnaire    Feeling of Stress : Not at all  Social Connections: Socially Integrated (11/14/2021)   Social Connection and Isolation Panel [NHANES]    Frequency of Communication with Friends and Family: More than three times a week    Frequency of Social Gatherings with Friends and Family: Patient refused    Attends Religious Services: More than 4 times per year    Active Member of [employer] or Organizations: Patient refused    Attends Music therapist: More than 4 times per year    Marital Status: Married  Human resources officer Violence: Not At Risk (11/14/2021)   Humiliation, Afraid, Rape, and Kick questionnaire    Fear of Current or Ex-Partner: No    Emotionally Abused: No    Physically Abused: No    Sexually Abused: No    Tobacco Use: Low Risk  (11/20/2021)   Patient History    Smoking Tobacco Use: Never    Smokeless Tobacco Use: Never    Passive Exposure: Never   Social History   Substance and Sexual Activity  Alcohol Use No    Family History  Problem Relation Age of Onset   Hypertension Mother    Alzheimer's disease Mother    Arthritis Mother    Hypertension Father    Alzheimer's disease Father    COPD Father    Heart disease Father     Arthritis Father    Hypertension Sister     Review of Systems  Constitutional:  Negative for chills and fever.  HENT: Negative.    Eyes: Negative.   Respiratory:  Negative for cough and shortness of breath.   Cardiovascular:  Negative for chest pain and palpitations.  Gastrointestinal:  Negative for abdominal pain, constipation, diarrhea, nausea and vomiting.  Genitourinary:  Negative for dysuria, frequency and urgency.  Musculoskeletal:  Positive for joint pain.  Skin:  Negative for rash.   Objective:  Physical Exam: Well nourished and well developed.  General: Alert and oriented x3, cooperative and pleasant, no acute distress.  Head: normocephalic, atraumatic, neck supple.  Eyes: EOMI. Abdomen: non-tender to palpation and soft, normoactive bowel sounds. Musculoskeletal: The patient has an antalgic gait pattern favoring the right side.  Right Knee Exam:  No effusion present. No swelling present.  The range of motion is: 7 to 115 degrees.  Mild crepitus on range of motion of the knee.  Positive medial joint line tenderness.  No lateral joint line tenderness.  The knee is stable.  Calves soft and nontender. Motor function intact in LE. Strength 5/5 LE bilaterally. Neuro: Distal pulses 2+. Sensation to light touch intact in LE.  Vital signs in last 24 hours: BP: ()/()  Arterial Line BP: ()/()   Imaging Review Plain radiographs demonstrate moderate degenerative joint disease of the right knee. The overall alignment is neutral. The bone quality appears to be adequate for age and reported activity level.  Assessment/Plan:  End stage arthritis, right knee   The patient history, physical examination, clinical judgment of the provider and imaging studies are consistent with end stage degenerative joint disease of the right knee and total knee arthroplasty is deemed medically necessary. The treatment options including medical management, injection therapy arthroscopy and  arthroplasty were discussed at length. The risks and benefits of total knee arthroplasty were presented and reviewed. The risks due to aseptic loosening, infection, stiffness, patella tracking problems, thromboembolic complications and other imponderables were discussed. The patient acknowledged the explanation, agreed to proceed with the plan and consent was  signed. Patient is being admitted for inpatient treatment for surgery, pain control, PT, OT, prophylactic antibiotics, VTE prophylaxis, progressive ambulation and ADLs and discharge planning. The patient is planning to be discharged  home .  Patient's anticipated LOS is less than 2 midnights, meeting these requirements: - Younger than 54 - Lives within 1 hour of care - Has a competent adult at home to recover with post-op - NO history of  - Chronic pain requiring opiods  - Diabetes  - Coronary Artery Disease  - Heart failure  - Heart attack  - Stroke  - DVT/VTE  - Cardiac arrhythmia  - Respiratory Failure/COPD  - Anemia  - Advanced Liver disease  Therapy Plans: EO Disposition: Home with Husband Planned DVT Prophylaxis: Eliquis (hx of renal cell carcinoma and partial removal of kidney) DME Needed: None PCP: Howard Pouch, DO (clearance received from 06/2021) Pulmonologist: Marland Kitchen, NP (clearance received) Oncologist: Lawanna Kobus, MD (clearance received) TXA: IV Allergies: NKDA Anesthesia Concerns: Nausea BMI: 35.8 Last HgbA1c: not diabetic  Pharmacy: [facility]  Other: -Requesting foley cath to be removed by end of day  - Patient was instructed on what medications to stop prior to surgery. - Follow-up visit in 2 weeks with Dr. Wynelle Link - Begin physical therapy following surgery - Pre-operative lab work as pre-surgical testing - Prescriptions will be provided in hospital at time of discharge  R. Jaynie Bream, PA-C Orthopedic Surgery [facility] [place]

## 2021-12-02 ENCOUNTER — Telehealth: Payer: Self-pay | Admitting: Internal Medicine

## 2021-12-02 MED ORDER — AZITHROMYCIN 250 MG PO TABS: ORAL_TABLET | ORAL | 0 refills | Status: DC

## 2021-12-02 MED ORDER — ALBUTEROL SULFATE (2.5 MG/3ML) 0.083% IN NEBU: 2.5000 mg | INHALATION_SOLUTION | Freq: Four times a day (QID) | RESPIRATORY_TRACT | 12 refills | Status: AC | PRN

## 2021-12-02 NOTE — Telephone Encounter (Signed)
Called and spoke with patient.  Patient stated she was taking care of her grandson and caught his cold.  Patient stated over the weekend she was taking Nyquil and Dayquil for relief.  Patient stated she is waking up in the morning wheezing and having a congested cough.  Patient stated she has been using her rescue inhaler and that has helped some.  Patient stated she is needing a refill for her albuterol nebs.  Patient stated she felt nebs would help and was wondering if Dr. Annamaria Boots thought she needed a z pack?  Patient is concerned cause she is scheduled a knee replacement 12/21/21. Patient requested any prescriptions be sent to [facility].  Message routed to Dr.Young to advise

## 2021-12-02 NOTE — Telephone Encounter (Signed)
Scripts sent for neb solution and Zpak

## 2021-12-02 NOTE — Telephone Encounter (Signed)
Called and spoke with patient.  Patient aware prescriptions sent to requested pharmacy.  Nothing further at this time.

## 2021-12-02 NOTE — Telephone Encounter (Signed)
Patient is having some cold symptoms, she was around her 11 month old grandson who was sick and he sneezed in her mouth. She states she rinsed her mouth out immediatley after but she still caught a cold. She's wheezing. She's scheduled for Knee replacement on 12/21/21. Wants to know if she could get an antibiotic.  [facility]: [facility]

## 2021-12-04 NOTE — Progress Notes (Signed)
COVID Vaccine Completed: Yes  Date of COVID positive in last 90 days:  PCP - Howard Pouch, DO Cardiologist -   Chest x-ray - 10-13-21 Epic EKG -  Stress Test -  ECHO -  Cardiac Cath -  Pacemaker/ICD device last checked: Spinal Cord Stimulator:  Bowel Prep -   Sleep Study -  CPAP -   Fasting Blood Sugar -  Checks Blood Sugar _____ times a day  Blood Thinner Instructions: Aspirin Instructions: Last Dose:  Activity level:  Can go up a flight of stairs and perform activities of daily living without stopping and without symptoms of chest pain or shortness of breath.  Able to exercise without symptoms  Unable to go up a flight of stairs without symptoms of     Anesthesia review: Heart murmur  Patient denies shortness of breath, fever, cough and chest pain at PAT appointment  Patient verbalized understanding of instructions that were given to them at the PAT appointment. Patient was also instructed that they will need to review over the PAT instructions again at home before surgery.

## 2021-12-04 NOTE — Patient Instructions (Signed)
SURGICAL WAITING ROOM VISITATION Patients having surgery or a procedure may have no more than 2 support people in the waiting area - these visitors may rotate.   Children under the age of 70 must have an adult with them who is not the patient. If the patient needs to stay at the hospital during part of their recovery, the visitor guidelines for inpatient rooms apply. Pre-op nurse will coordinate an appropriate time for 1 support person to accompany patient in pre-op.  This support person may not rotate.    Please refer to the [facility] website for the visitor guidelines for Inpatients (after your surgery is over and you are in a regular room).      Your procedure is scheduled on: 12-21-21   Report to [facility] Main Entrance    Report to admitting at 5:50 AM   Call this number if you have problems the morning of surgery [phone]   Do not eat food :After Midnight.   After Midnight you may have the following liquids until 5:15 AM DAY OF SURGERY  Water Non-Citrus Juices (without pulp, NO RED) Carbonated Beverages Black Coffee (NO MILK/CREAM OR CREAMERS, sugar ok)  Clear Tea (NO MILK/CREAM OR CREAMERS, sugar ok) regular and decaf                             Plain Jell-O (NO RED)                                           Fruit ices (not with fruit pulp, NO RED)                                     Popsicles (NO RED)                                                               Sports drinks like Gatorade (NO RED)                  The day of surgery:  Drink ONE (1) Pre-Surgery Clear Ensure at 5:15 AM the morning of surgery. Drink in one sitting. Do not sip.  This drink was given to you during your hospital  pre-op appointment visit. Nothing else to drink after completing the Pre-Surgery Clear Ensure           If you have questions, please contact your surgeon's office.   FOLLOW  AND ANY ADDITIONAL PRE OP INSTRUCTIONS YOU RECEIVED FROM YOUR SURGEON'S OFFICE!!!      Oral Hygiene is also important to reduce your risk of infection.                                    Remember - BRUSH YOUR TEETH THE MORNING OF SURGERY WITH YOUR REGULAR TOOTHPASTE   Do NOT smoke after Midnight   Take these medicines the morning of surgery with A SIP OF WATER: Alprazolam, Azithromycin.  Okay to use Albuterol inhaler  You may not have any metal on your body including hair pins, jewelry, and body piercing             Do not wear make-up, lotions, powders, perfumes or deodorant  Do not wear nail polish including gel and S&S, artificial/acrylic nails, or any other type of covering on natural nails including finger and toenails. If you have artificial nails, gel coating, etc. that needs to be removed by a nail salon please have this removed prior to surgery or surgery may need to be canceled/ delayed if the surgeon/ anesthesia feels like they are unable to be safely monitored.   Do not shave  48 hours prior to surgery.    Do not bring valuables to the hospital. [place].   Contacts, dentures or bridgework may not be worn into surgery.   Bring small overnight bag day of surgery.   DO NOT [place]. PHARMACY WILL DISPENSE MEDICATIONS LISTED ON YOUR MEDICATION LIST TO YOU DURING YOUR ADMISSION [place]!   Special Instructions: Bring a copy of your healthcare power of attorney and living will documents the day of surgery if you haven't scanned them before.  Please read over the following fact sheets you were given: IF YOU HAVE QUESTIONS ABOUT YOUR PRE-OP INSTRUCTIONS PLEASE CALL [facility] - Preparing for Surgery Before surgery, you can play an important role.  Because skin is not sterile, your skin needs to be as free of germs as possible.  You can reduce the number of germs on your skin by washing with CHG (chlorahexidine gluconate) soap before  surgery.  CHG is an antiseptic cleaner which kills germs and bonds with the skin to continue killing germs even after washing. Please DO NOT use if you have an allergy to CHG or antibacterial soaps.  If your skin becomes reddened/irritated stop using the CHG and inform your nurse when you arrive at Short Stay. Do not shave (including legs and underarms) for at least 48 hours prior to the first CHG shower.  You may shave your face/neck.  Please follow these instructions carefully:  1.  Shower with CHG Soap the night before surgery and the  morning of surgery.  2.  If you choose to wash your hair, wash your hair first as usual with your normal  shampoo.  3.  After you shampoo, rinse your hair and body thoroughly to remove the shampoo.                             4.  Use CHG as you would any other liquid soap.  You can apply chg directly to the skin and wash.  Gently with a scrungie or clean washcloth.  5.  Apply the CHG Soap to your body ONLY FROM THE NECK DOWN.   Do   not use on face/ open                           Wound or open sores. Avoid contact with eyes, ears mouth and   genitals (private parts).                       Wash face,  Genitals (private parts) with your normal soap.             6.  Wash  thoroughly, paying special attention to the area where your    surgery  will be performed.  7.  Thoroughly rinse your body with warm water from the neck down.  8.  DO NOT shower/wash with your normal soap after using and rinsing off the CHG Soap.                9.  Pat yourself dry with a clean towel.            10.  Wear clean pajamas.            11.  Place clean sheets on your bed the night of your first shower and do not  sleep with pets. Day of Surgery : Do not apply any lotions/deodorants the morning of surgery.  Please wear clean clothes to the hospital/surgery center.  FAILURE TO FOLLOW THESE INSTRUCTIONS MAY RESULT IN THE CANCELLATION OF YOUR SURGERY  PATIENT  SIGNATURE_________________________________  NURSE SIGNATURE__________________________________  ________________________________________________________________________     Adam Phenix  An incentive spirometer is a tool that can help keep your lungs clear and active. This tool measures how well you are filling your lungs with each breath. Taking long deep breaths may help reverse or decrease the chance of developing breathing (pulmonary) problems (especially infection) following: A long period of time when you are unable to move or be active. BEFORE THE PROCEDURE  If the spirometer includes an indicator to show your best effort, your nurse or respiratory therapist will set it to a desired goal. If possible, sit up straight or lean slightly forward. Try not to slouch. Hold the incentive spirometer in an upright position. INSTRUCTIONS FOR USE  Sit on the edge of your bed if possible, or sit up as far as you can in bed or on a chair. Hold the incentive spirometer in an upright position. Breathe out normally. Place the mouthpiece in your mouth and seal your lips tightly around it. Breathe in slowly and as deeply as possible, raising the piston or the ball toward the top of the column. Hold your breath for 3-5 seconds or for as long as possible. Allow the piston or ball to fall to the bottom of the column. Remove the mouthpiece from your mouth and breathe out normally. Rest for a few seconds and repeat Steps 1 through 7 at least 10 times every 1-2 hours when you are awake. Take your time and take a few normal breaths between deep breaths. The spirometer may include an indicator to show your best effort. Use the indicator as a goal to work toward during each repetition. After each set of 10 deep breaths, practice coughing to be sure your lungs are clear. If you have an incision (the cut made at the time of surgery), support your incision when coughing by placing a pillow or rolled up  towels firmly against it. Once you are able to get out of bed, walk around indoors and cough well. You may stop using the incentive spirometer when instructed by your caregiver.  RISKS AND COMPLICATIONS Take your time so you do not get dizzy or light-headed. If you are in pain, you may need to take or ask for pain medication before doing incentive spirometry. It is harder to take a deep breath if you are having pain. AFTER USE Rest and breathe slowly and easily. It can be helpful to keep track of a log of your progress. Your caregiver can provide you with a simple table to help with this. If  you are using the spirometer at home, follow these instructions: [place] IF:  You are having difficultly using the spirometer. You have trouble using the spirometer as often as instructed. Your pain medication is not giving enough relief while using the spirometer. You develop fever of 100.5 F (38.1 C) or higher. SEEK IMMEDIATE MEDICAL CARE IF:  You cough up bloody sputum that had not been present before. You develop fever of 102 F (38.9 C) or greater. You develop worsening pain at or near the incision site. MAKE SURE YOU:  Understand these instructions. Will watch your condition. Will get help right away if you are not doing well or get worse. Document Released: 08/02/2006 Document Revised: 06/14/2011 Document Reviewed: 10/03/2006 [facility] [address] Patient Information 2014 [place], [place].   ________________________________________________________________________

## 2021-12-08 ENCOUNTER — Other Ambulatory Visit: Payer: Self-pay

## 2021-12-08 ENCOUNTER — Encounter (HOSPITAL_COMMUNITY)
Admission: RE | Admit: 2021-12-08 | Discharge: 2021-12-08 | Disposition: A | Discharge status: Home / Self Care | Payer: Medicare Other | Source: Ambulatory Visit | Attending: Orthopedic Surgery | Admitting: Orthopedic Surgery

## 2021-12-08 ENCOUNTER — Encounter (HOSPITAL_COMMUNITY): Payer: Self-pay

## 2021-12-08 VITALS — BP 128/73 | HR 84 | Temp 99.0°F | Resp 16 | Ht 61.0 in | Wt 194.0 lb

## 2021-12-08 DIAGNOSIS — I2510 Atherosclerotic heart disease of native coronary artery without angina pectoris: Secondary | ICD-10-CM | POA: Insufficient documentation

## 2021-12-08 DIAGNOSIS — K769 Liver disease, unspecified: Secondary | ICD-10-CM | POA: Insufficient documentation

## 2021-12-08 DIAGNOSIS — Z01818 Encounter for other preprocedural examination: Secondary | ICD-10-CM | POA: Diagnosis not present

## 2021-12-08 HISTORY — DX: Bronchitis, not specified as acute or chronic: J40

## 2021-12-08 HISTORY — DX: Essential (primary) hypertension: I10

## 2021-12-08 HISTORY — DX: Unspecified asthma, uncomplicated: J45.909

## 2021-12-08 LAB — COMPREHENSIVE METABOLIC PANEL
ALT: 45 U/L — ABNORMAL HIGH (ref 0–44)
AST: 31 U/L (ref 15–41)
Albumin: 3.6 g/dL (ref 3.5–5.0)
Alkaline Phosphatase: 115 U/L (ref 38–126)
Anion gap: 7 (ref 5–15)
BUN: 18 mg/dL (ref 8–23)
CO2: 26 mmol/L (ref 22–32)
Calcium: 9.6 mg/dL (ref 8.9–10.3)
Chloride: 105 mmol/L (ref 98–111)
Creatinine, Ser: 0.78 mg/dL (ref 0.44–1.00)
GFR, Estimated: >60 mL/min (ref >60)
Glucose, Bld: 100 mg/dL — ABNORMAL HIGH (ref 70–99)
Potassium: 3.9 mmol/L (ref 3.5–5.1)
Sodium: 138 mmol/L (ref 135–145)
Total Bilirubin: 0.5 mg/dL (ref 0.3–1.2)
Total Protein: 7.5 g/dL (ref 6.5–8.1)

## 2021-12-08 LAB — SURGICAL PCR SCREEN
MRSA, PCR: NEGATIVE
Staphylococcus aureus: NEGATIVE

## 2021-12-08 LAB — CBC
HCT: 41.9 % (ref 36.0–46.0)
Hemoglobin: 13.2 g/dL (ref 12.0–15.0)
MCH: 29.3 pg (ref 26.0–34.0)
MCHC: 31.5 g/dL (ref 30.0–36.0)
MCV: 92.9 fL (ref 80.0–100.0)
Platelets: 357 10*3/uL (ref 150–400)
RBC: 4.51 MIL/uL (ref 3.87–5.11)
RDW: 14.7 % (ref 11.5–15.5)
WBC: 6.5 10*3/uL (ref 4.0–10.5)
nRBC: 0 % (ref 0.0–0.2)

## 2021-12-20 NOTE — Anesthesia Preprocedure Evaluation (Signed)
Anesthesia Evaluation  Patient identified by MRN, date of birth, ID band Patient awake    Reviewed: Allergy & Precautions, NPO status , Patient's Chart, lab work & pertinent test results  History of Anesthesia Complications Negative for: history of anesthetic complications  Airway Mallampati: II  TM Distance: >3 FB Neck ROM: Full    Dental  (+) Dental Advisory Given   Pulmonary asthma ,    Pulmonary exam normal        Cardiovascular hypertension, Pt. on medications Normal cardiovascular exam     Neuro/Psych PSYCHIATRIC DISORDERS Anxiety Depression negative neurological ROS     GI/Hepatic Neg liver ROS, GERD  Controlled and Medicated,  Endo/Other   Obesity   Renal/GU  Hx RCC s/p partial nephrectomy and RFA       Musculoskeletal  (+) Arthritis ,   Abdominal   Peds  Hematology negative hematology ROS (+)   Anesthesia Other Findings   Reproductive/Obstetrics                            Anesthesia Physical Anesthesia Plan  ASA: 2  Anesthesia Plan: Spinal   Post-op Pain Management: Tylenol PO (pre-op)* and Regional block*   Induction:   PONV Risk Score and Plan: 2 and Treatment may vary due to age or medical condition and Propofol infusion  Airway Management Planned: Natural Airway and Simple Face Mask  Additional Equipment: None  Intra-op Plan:   Post-operative Plan:   Informed Consent: I have reviewed the patients History and Physical, chart, labs and discussed the procedure including the risks, benefits and alternatives for the proposed anesthesia with the patient or authorized representative who has indicated his/her understanding and acceptance.       Plan Discussed with: CRNA and Anesthesiologist  Anesthesia Plan Comments: (Labs reviewed, platelets acceptable. Discussed risks and benefits of spinal, including spinal/epidural hematoma, infection, failed block, and  PDPH. Patient expressed understanding and wished to proceed. )       Anesthesia Quick Evaluation

## 2021-12-21 ENCOUNTER — Observation Stay (HOSPITAL_COMMUNITY)
Admission: RE | Admit: 2021-12-21 | Discharge: 2021-12-22 | Disposition: A | Discharge status: Home / Self Care | Payer: Medicare Other | Source: Ambulatory Visit | Attending: Orthopedic Surgery | Admitting: Orthopedic Surgery

## 2021-12-21 ENCOUNTER — Other Ambulatory Visit: Payer: Self-pay

## 2021-12-21 ENCOUNTER — Ambulatory Visit (HOSPITAL_BASED_OUTPATIENT_CLINIC_OR_DEPARTMENT_OTHER): Payer: Medicare Other | Admitting: Anesthesiology

## 2021-12-21 ENCOUNTER — Encounter (HOSPITAL_COMMUNITY): Payer: Self-pay | Admitting: Orthopedic Surgery

## 2021-12-21 ENCOUNTER — Ambulatory Visit (HOSPITAL_COMMUNITY): Payer: Medicare Other | Admitting: Physician Assistant

## 2021-12-21 ENCOUNTER — Encounter (HOSPITAL_COMMUNITY)
Admission: RE | Disposition: A | Discharge status: Home / Self Care | Payer: Self-pay | Source: Ambulatory Visit | Attending: Orthopedic Surgery

## 2021-12-21 DIAGNOSIS — Z8616 Personal history of COVID-19: Secondary | ICD-10-CM | POA: Insufficient documentation

## 2021-12-21 DIAGNOSIS — I10 Essential (primary) hypertension: Secondary | ICD-10-CM | POA: Insufficient documentation

## 2021-12-21 DIAGNOSIS — Z85528 Personal history of other malignant neoplasm of kidney: Secondary | ICD-10-CM | POA: Insufficient documentation

## 2021-12-21 DIAGNOSIS — G8918 Other acute postprocedural pain: Secondary | ICD-10-CM | POA: Diagnosis not present

## 2021-12-21 DIAGNOSIS — Z96652 Presence of left artificial knee joint: Secondary | ICD-10-CM | POA: Diagnosis not present

## 2021-12-21 DIAGNOSIS — J45909 Unspecified asthma, uncomplicated: Secondary | ICD-10-CM | POA: Diagnosis not present

## 2021-12-21 DIAGNOSIS — Z79899 Other long term (current) drug therapy: Secondary | ICD-10-CM | POA: Insufficient documentation

## 2021-12-21 DIAGNOSIS — M1711 Unilateral primary osteoarthritis, right knee: Secondary | ICD-10-CM

## 2021-12-21 HISTORY — PX: TOTAL KNEE ARTHROPLASTY: SHX125

## 2021-12-21 SURGERY — ARTHROPLASTY, KNEE, TOTAL
Anesthesia: Spinal | Site: Knee | Laterality: Right

## 2021-12-21 MED ORDER — SODIUM CHLORIDE (PF) 0.9 % IJ SOLN
INTRAMUSCULAR | Status: AC
  Filled 2021-12-21: qty 10

## 2021-12-21 MED ORDER — OXYCODONE HCL 5 MG PO TABS: 5.0000 mg | ORAL_TABLET | Freq: Once | ORAL | Status: DC | PRN

## 2021-12-21 MED ORDER — PHENOL 1.4 % MT LIQD: 1.0000 | OROMUCOSAL | Status: DC | PRN

## 2021-12-21 MED ORDER — SODIUM CHLORIDE (PF) 0.9 % IJ SOLN
INTRAMUSCULAR | Status: AC
  Filled 2021-12-21: qty 50

## 2021-12-21 MED ORDER — METHOCARBAMOL 500 MG IVPB - SIMPLE MED
INTRAVENOUS | Status: AC
  Filled 2021-12-21: qty 55

## 2021-12-21 MED ORDER — DEXAMETHASONE SODIUM PHOSPHATE 10 MG/ML IJ SOLN
INTRAMUSCULAR | Status: AC
  Filled 2021-12-21: qty 1

## 2021-12-21 MED ORDER — OXYCODONE HCL 5 MG PO TABS
10.0000 mg | ORAL_TABLET | ORAL | Status: DC | PRN
  Administered 2021-12-21 – 2021-12-22 (×6): 10 mg via ORAL
  Filled 2021-12-21 (×5): qty 2

## 2021-12-21 MED ORDER — MENTHOL 3 MG MT LOZG: 1.0000 | LOZENGE | OROMUCOSAL | Status: DC | PRN

## 2021-12-21 MED ORDER — ONDANSETRON HCL 4 MG/2ML IJ SOLN: 4.0000 mg | Freq: Four times a day (QID) | INTRAMUSCULAR | Status: DC | PRN

## 2021-12-21 MED ORDER — ONDANSETRON HCL 4 MG/2ML IJ SOLN
INTRAMUSCULAR | Status: AC
  Filled 2021-12-21: qty 2

## 2021-12-21 MED ORDER — OLANZAPINE-FLUOXETINE HCL 3-25 MG PO CAPS
1.0000 | ORAL_CAPSULE | Freq: Every day | ORAL | Status: DC
  Administered 2021-12-21: 1 via ORAL
  Filled 2021-12-21: qty 1

## 2021-12-21 MED ORDER — APIXABAN 2.5 MG PO TABS
2.5000 mg | ORAL_TABLET | Freq: Two times a day (BID) | ORAL | Status: DC
  Administered 2021-12-22: 2.5 mg via ORAL
  Filled 2021-12-21: qty 1

## 2021-12-21 MED ORDER — ONDANSETRON HCL 4 MG PO TABS: 4.0000 mg | ORAL_TABLET | Freq: Four times a day (QID) | ORAL | Status: DC | PRN

## 2021-12-21 MED ORDER — BISACODYL 10 MG RE SUPP: 10.0000 mg | Freq: Every day | RECTAL | Status: DC | PRN

## 2021-12-21 MED ORDER — METHOCARBAMOL 500 MG IVPB - SIMPLE MED
500.0000 mg | Freq: Four times a day (QID) | INTRAVENOUS | Status: DC | PRN
  Administered 2021-12-21: 500 mg via INTRAVENOUS

## 2021-12-21 MED ORDER — DIPHENHYDRAMINE HCL 12.5 MG/5ML PO ELIX
12.5000 mg | ORAL_SOLUTION | ORAL | Status: DC | PRN
  Filled 2021-12-21: qty 10

## 2021-12-21 MED ORDER — POLYETHYLENE GLYCOL 3350 17 G PO PACK
17.0000 g | PACK | Freq: Every day | ORAL | Status: DC | PRN
  Administered 2021-12-21 – 2021-12-22 (×2): 17 g via ORAL
  Filled 2021-12-21 (×2): qty 1

## 2021-12-21 MED ORDER — FENTANYL CITRATE (PF) 100 MCG/2ML IJ SOLN
INTRAMUSCULAR | Status: AC
  Filled 2021-12-21: qty 2

## 2021-12-21 MED ORDER — LACTATED RINGERS IV SOLN: INTRAVENOUS | Status: DC

## 2021-12-21 MED ORDER — TRANEXAMIC ACID-NACL 1000-0.7 MG/100ML-% IV SOLN
1000.0000 mg | INTRAVENOUS | Status: AC
  Administered 2021-12-21: 1000 mg via INTRAVENOUS
  Filled 2021-12-21: qty 100

## 2021-12-21 MED ORDER — ALBUTEROL SULFATE HFA 108 (90 BASE) MCG/ACT IN AERS: 1.0000 | INHALATION_SPRAY | Freq: Four times a day (QID) | RESPIRATORY_TRACT | Status: DC | PRN

## 2021-12-21 MED ORDER — MIDAZOLAM HCL 2 MG/2ML IJ SOLN
1.0000 mg | INTRAMUSCULAR | Status: DC
  Administered 2021-12-21: 1 mg via INTRAVENOUS
  Filled 2021-12-21: qty 2

## 2021-12-21 MED ORDER — MOMETASONE FURO-FORMOTEROL FUM 100-5 MCG/ACT IN AERO
2.0000 | INHALATION_SPRAY | Freq: Two times a day (BID) | RESPIRATORY_TRACT | Status: DC
  Administered 2021-12-21 – 2021-12-22 (×2): 2 via RESPIRATORY_TRACT
  Filled 2021-12-21: qty 8.8

## 2021-12-21 MED ORDER — ORAL CARE MOUTH RINSE: 15.0000 mL | Freq: Once | OROMUCOSAL | Status: AC

## 2021-12-21 MED ORDER — ROPIVACAINE HCL 7.5 MG/ML IJ SOLN
INTRAMUSCULAR | Status: DC | PRN
  Administered 2021-12-21: 20 mL via PERINEURAL

## 2021-12-21 MED ORDER — PROPOFOL 500 MG/50ML IV EMUL
INTRAVENOUS | Status: DC | PRN
  Administered 2021-12-21: 50 ug/kg/min via INTRAVENOUS
  Administered 2021-12-21 (×2): 30 mg via INTRAVENOUS

## 2021-12-21 MED ORDER — PROPOFOL 1000 MG/100ML IV EMUL
INTRAVENOUS | Status: AC
  Filled 2021-12-21: qty 100

## 2021-12-21 MED ORDER — CEFAZOLIN SODIUM-DEXTROSE 2-4 GM/100ML-% IV SOLN
2.0000 g | Freq: Four times a day (QID) | INTRAVENOUS | Status: AC
  Administered 2021-12-21 (×2): 2 g via INTRAVENOUS
  Filled 2021-12-21 (×2): qty 100

## 2021-12-21 MED ORDER — GABAPENTIN 300 MG PO CAPS
300.0000 mg | ORAL_CAPSULE | Freq: Three times a day (TID) | ORAL | Status: DC
  Administered 2021-12-21 – 2021-12-22 (×3): 300 mg via ORAL
  Filled 2021-12-21 (×3): qty 1

## 2021-12-21 MED ORDER — POVIDONE-IODINE 10 % EX SWAB: Freq: Once | CUTANEOUS | Status: AC

## 2021-12-21 MED ORDER — 0.9 % SODIUM CHLORIDE (POUR BTL) OPTIME
TOPICAL | Status: DC | PRN
  Administered 2021-12-21: 1000 mL

## 2021-12-21 MED ORDER — STERILE WATER FOR IRRIGATION IR SOLN
Status: DC | PRN
  Administered 2021-12-21: 2000 mL

## 2021-12-21 MED ORDER — ONDANSETRON HCL 4 MG/2ML IJ SOLN: 4.0000 mg | Freq: Once | INTRAMUSCULAR | Status: DC | PRN

## 2021-12-21 MED ORDER — DOCUSATE SODIUM 100 MG PO CAPS
100.0000 mg | ORAL_CAPSULE | Freq: Two times a day (BID) | ORAL | Status: DC
  Administered 2021-12-21 – 2021-12-22 (×3): 100 mg via ORAL
  Filled 2021-12-21 (×3): qty 1

## 2021-12-21 MED ORDER — ONDANSETRON HCL 4 MG/2ML IJ SOLN
INTRAMUSCULAR | Status: DC | PRN
  Administered 2021-12-21: 4 mg via INTRAVENOUS

## 2021-12-21 MED ORDER — METOCLOPRAMIDE HCL 5 MG PO TABS: 5.0000 mg | ORAL_TABLET | Freq: Three times a day (TID) | ORAL | Status: DC | PRN

## 2021-12-21 MED ORDER — FLEET ENEMA 7-19 GM/118ML RE ENEM: 1.0000 | ENEMA | Freq: Once | RECTAL | Status: DC | PRN

## 2021-12-21 MED ORDER — HYDROCHLOROTHIAZIDE 25 MG PO TABS
25.0000 mg | ORAL_TABLET | Freq: Every day | ORAL | Status: DC
  Filled 2021-12-21: qty 1

## 2021-12-21 MED ORDER — DEXAMETHASONE SODIUM PHOSPHATE 10 MG/ML IJ SOLN: 8.0000 mg | Freq: Once | INTRAMUSCULAR | Status: DC

## 2021-12-21 MED ORDER — FENTANYL CITRATE PF 50 MCG/ML IJ SOSY
PREFILLED_SYRINGE | INTRAMUSCULAR | Status: AC
  Filled 2021-12-21: qty 2

## 2021-12-21 MED ORDER — MORPHINE SULFATE (PF) 2 MG/ML IV SOLN: 1.0000 mg | INTRAVENOUS | Status: DC | PRN

## 2021-12-21 MED ORDER — BUPIVACAINE LIPOSOME 1.3 % IJ SUSP
INTRAMUSCULAR | Status: DC | PRN
  Administered 2021-12-21: 20 mL

## 2021-12-21 MED ORDER — BUPIVACAINE LIPOSOME 1.3 % IJ SUSP
INTRAMUSCULAR | Status: AC
  Filled 2021-12-21: qty 20

## 2021-12-21 MED ORDER — CHLORHEXIDINE GLUCONATE 0.12 % MT SOLN
15.0000 mL | Freq: Once | OROMUCOSAL | Status: AC
  Administered 2021-12-21: 15 mL via OROMUCOSAL

## 2021-12-21 MED ORDER — DEXAMETHASONE SODIUM PHOSPHATE 10 MG/ML IJ SOLN
10.0000 mg | Freq: Once | INTRAMUSCULAR | Status: AC
  Administered 2021-12-22: 10 mg via INTRAVENOUS
  Filled 2021-12-21: qty 1

## 2021-12-21 MED ORDER — DEXAMETHASONE SODIUM PHOSPHATE 10 MG/ML IJ SOLN
INTRAMUSCULAR | Status: DC | PRN
  Administered 2021-12-21: 10 mg via INTRAVENOUS

## 2021-12-21 MED ORDER — SODIUM CHLORIDE 0.9 % IR SOLN
Status: DC | PRN
  Administered 2021-12-21: 1000 mL

## 2021-12-21 MED ORDER — OXYCODONE HCL 5 MG PO TABS
5.0000 mg | ORAL_TABLET | ORAL | Status: DC | PRN
  Filled 2021-12-21 (×2): qty 1

## 2021-12-21 MED ORDER — METHOCARBAMOL 500 MG PO TABS
500.0000 mg | ORAL_TABLET | Freq: Four times a day (QID) | ORAL | Status: DC | PRN
  Administered 2021-12-21 – 2021-12-22 (×3): 500 mg via ORAL
  Filled 2021-12-21 (×3): qty 1

## 2021-12-21 MED ORDER — ACETAMINOPHEN 500 MG PO TABS
1000.0000 mg | ORAL_TABLET | Freq: Four times a day (QID) | ORAL | Status: AC
  Administered 2021-12-21 – 2021-12-22 (×4): 1000 mg via ORAL
  Filled 2021-12-21 (×4): qty 2

## 2021-12-21 MED ORDER — OXYCODONE HCL 5 MG/5ML PO SOLN: 5.0000 mg | Freq: Once | ORAL | Status: DC | PRN

## 2021-12-21 MED ORDER — ALBUTEROL SULFATE (2.5 MG/3ML) 0.083% IN NEBU: 2.5000 mg | INHALATION_SOLUTION | Freq: Four times a day (QID) | RESPIRATORY_TRACT | Status: DC | PRN

## 2021-12-21 MED ORDER — SODIUM CHLORIDE (PF) 0.9 % IJ SOLN
INTRAMUSCULAR | Status: DC | PRN
  Administered 2021-12-21: 60 mL

## 2021-12-21 MED ORDER — LIDOCAINE 2% (20 MG/ML) 5 ML SYRINGE
INTRAMUSCULAR | Status: DC | PRN
  Administered 2021-12-21: 60 mg via INTRAVENOUS

## 2021-12-21 MED ORDER — METOCLOPRAMIDE HCL 5 MG/ML IJ SOLN: 5.0000 mg | Freq: Three times a day (TID) | INTRAMUSCULAR | Status: DC | PRN

## 2021-12-21 MED ORDER — ACETAMINOPHEN 10 MG/ML IV SOLN
1000.0000 mg | Freq: Four times a day (QID) | INTRAVENOUS | Status: DC
  Administered 2021-12-21: 1000 mg via INTRAVENOUS
  Filled 2021-12-21: qty 100

## 2021-12-21 MED ORDER — ALPRAZOLAM 0.5 MG PO TABS: 0.5000 mg | ORAL_TABLET | Freq: Two times a day (BID) | ORAL | Status: DC | PRN

## 2021-12-21 MED ORDER — BUPIVACAINE LIPOSOME 1.3 % IJ SUSP: 20.0000 mL | Freq: Once | INTRAMUSCULAR | Status: DC

## 2021-12-21 MED ORDER — POTASSIUM CHLORIDE CRYS ER 20 MEQ PO TBCR
40.0000 meq | EXTENDED_RELEASE_TABLET | Freq: Every day | ORAL | Status: DC
  Administered 2021-12-21: 40 meq via ORAL
  Filled 2021-12-21: qty 2

## 2021-12-21 MED ORDER — FENTANYL CITRATE PF 50 MCG/ML IJ SOSY
50.0000 ug | PREFILLED_SYRINGE | INTRAMUSCULAR | Status: DC
  Administered 2021-12-21: 50 ug via INTRAVENOUS
  Filled 2021-12-21: qty 2

## 2021-12-21 MED ORDER — FENTANYL CITRATE (PF) 100 MCG/2ML IJ SOLN
INTRAMUSCULAR | Status: DC | PRN
  Administered 2021-12-21 (×2): 50 ug via INTRAVENOUS

## 2021-12-21 MED ORDER — CEFAZOLIN SODIUM-DEXTROSE 2-4 GM/100ML-% IV SOLN
2.0000 g | INTRAVENOUS | Status: AC
  Administered 2021-12-21: 2 g via INTRAVENOUS
  Filled 2021-12-21: qty 100

## 2021-12-21 MED ORDER — FENTANYL CITRATE PF 50 MCG/ML IJ SOSY
25.0000 ug | PREFILLED_SYRINGE | INTRAMUSCULAR | Status: DC | PRN
  Administered 2021-12-21 (×2): 50 ug via INTRAVENOUS

## 2021-12-21 MED ORDER — SODIUM CHLORIDE 0.9 % IV SOLN: INTRAVENOUS | Status: DC

## 2021-12-21 SURGICAL SUPPLY — 55 items
ATTUNE MED DOME PAT 32 KNEE (Knees) IMPLANT
ATTUNE PS FEM RT SZ 4 CEM KNEE (Femur) IMPLANT
ATTUNE PSRP INSR SZ4 8 KNEE (Insert) IMPLANT
BAG COUNTER SPONGE SURGICOUNT (BAG) IMPLANT
BAG SPEC THK2 15X12 ZIP CLS (MISCELLANEOUS) ×1
BAG SPNG CNTER NS LX DISP (BAG)
BAG ZIPLOCK 12X15 (MISCELLANEOUS) ×2 IMPLANT
BASEPLATE TIBIAL ROTATING SZ 4 (Knees) IMPLANT
BLADE SAG 18X100X1.27 (BLADE) ×2 IMPLANT
BLADE SAW SGTL 11.0X1.19X90.0M (BLADE) ×2 IMPLANT
BNDG ELASTIC 6X5.8 VLCR STR LF (GAUZE/BANDAGES/DRESSINGS) ×2 IMPLANT
BOWL SMART MIX CTS (DISPOSABLE) ×2 IMPLANT
BSPLAT TIB 4 CMNT ROT PLAT STR (Knees) ×1 IMPLANT
CEMENT HV SMART SET (Cement) ×4 IMPLANT
CLOSURE STERI STRIP 1/2 X4 (GAUZE/BANDAGES/DRESSINGS) IMPLANT
COVER SURGICAL LIGHT HANDLE (MISCELLANEOUS) ×2 IMPLANT
CUFF TOURN SGL QUICK 34 (TOURNIQUET CUFF) ×1
CUFF TRNQT CYL 34X4.125X (TOURNIQUET CUFF) ×2 IMPLANT
DRAPE INCISE IOBAN 66X45 STRL (DRAPES) ×2 IMPLANT
DRAPE U-SHAPE 47X51 STRL (DRAPES) ×2 IMPLANT
DRSG AQUACEL AG ADV 3.5X10 (GAUZE/BANDAGES/DRESSINGS) ×2 IMPLANT
DURAPREP 26ML APPLICATOR (WOUND CARE) ×2 IMPLANT
ELECT REM PT RETURN 15FT ADLT (MISCELLANEOUS) ×2 IMPLANT
GLOVE BIO SURGEON STRL SZ 6.5 (GLOVE) IMPLANT
GLOVE BIO SURGEON STRL SZ7.5 (GLOVE) IMPLANT
GLOVE BIO SURGEON STRL SZ8 (GLOVE) ×2 IMPLANT
GLOVE BIOGEL PI IND STRL 6.5 (GLOVE) IMPLANT
GLOVE BIOGEL PI IND STRL 7.0 (GLOVE) IMPLANT
GLOVE BIOGEL PI IND STRL 8 (GLOVE) ×2 IMPLANT
GOWN STRL REUS W/ TWL LRG LVL3 (GOWN DISPOSABLE) ×2 IMPLANT
GOWN STRL REUS W/ TWL XL LVL3 (GOWN DISPOSABLE) IMPLANT
GOWN STRL REUS W/TWL LRG LVL3 (GOWN DISPOSABLE) ×1
GOWN STRL REUS W/TWL XL LVL3 (GOWN DISPOSABLE)
HANDPIECE INTERPULSE COAX TIP (DISPOSABLE) ×1
HOLDER FOLEY CATH W/STRAP (MISCELLANEOUS) IMPLANT
IMMOBILIZER KNEE 20 (SOFTGOODS) ×1
IMMOBILIZER KNEE 20 THIGH 36 (SOFTGOODS) ×2 IMPLANT
KIT TURNOVER KIT A (KITS) IMPLANT
MANIFOLD NEPTUNE II (INSTRUMENTS) ×2 IMPLANT
NS IRRIG 1000ML POUR BTL (IV SOLUTION) ×2 IMPLANT
PACK TOTAL KNEE CUSTOM (KITS) ×2 IMPLANT
PADDING CAST COTTON 6X4 STRL (CAST SUPPLIES) ×4 IMPLANT
PROTECTOR NERVE ULNAR (MISCELLANEOUS) ×2 IMPLANT
SET HNDPC FAN SPRY TIP SCT (DISPOSABLE) ×2 IMPLANT
SPIKE FLUID TRANSFER (MISCELLANEOUS) ×2 IMPLANT
STRIP CLOSURE SKIN 1/2X4 (GAUZE/BANDAGES/DRESSINGS) ×4 IMPLANT
SUT MNCRL AB 4-0 PS2 18 (SUTURE) ×2 IMPLANT
SUT STRATAFIX 0 PDS 27 VIOLET (SUTURE) ×1
SUT VIC AB 2-0 CT1 27 (SUTURE) ×3
SUT VIC AB 2-0 CT1 TAPERPNT 27 (SUTURE) ×6 IMPLANT
SUTURE STRATFX 0 PDS 27 VIOLET (SUTURE) ×2 IMPLANT
TRAY FOLEY MTR SLVR 16FR STAT (SET/KITS/TRAYS/PACK) ×2 IMPLANT
TUBE SUCTION HIGH CAP CLEAR NV (SUCTIONS) ×2 IMPLANT
WATER STERILE IRR 1000ML POUR (IV SOLUTION) ×4 IMPLANT
WRAP KNEE MAXI GEL POST OP (GAUZE/BANDAGES/DRESSINGS) ×2 IMPLANT

## 2021-12-21 NOTE — Anesthesia Postprocedure Evaluation (Signed)
Anesthesia Post Note  Patient: [name]  Procedure(s) Performed: TOTAL KNEE ARTHROPLASTY (Right: Knee)     Patient location during evaluation: PACU Anesthesia Type: General Level of consciousness: awake and alert Pain management: pain level controlled Vital Signs Assessment: post-procedure vital signs reviewed and stable Respiratory status: spontaneous breathing, respiratory function stable and nonlabored ventilation Cardiovascular status: blood pressure returned to baseline and stable Postop Assessment: no apparent nausea or vomiting and no headache Anesthetic complications: no   No notable events documented.  Last Vitals:  Vitals:   12/21/21 1030 12/21/21 1048  BP: (!) 147/78 134/77  Pulse: 70 69  Resp: 20 18  Temp: 37.1 C 37 C  SpO2: 100% 98%                   Audry Pili

## 2021-12-21 NOTE — Progress Notes (Signed)
Orthopedic Tech Progress Note Patient Details:  [name] [birth year] [id number]  CPM Right Knee CPM Right Knee: On Right Knee Flexion (Degrees): 40 Right Knee Extension (Degrees): 10  Post Interventions Patient Tolerated: Well  Vernona Rieger 12/21/2021, 10:12 AM

## 2021-12-21 NOTE — Discharge Instructions (Signed)
Ollen Gross, MD Total Joint Specialist [facility] [address], [address] [place], [place] [address] [phone]  TOTAL KNEE REPLACEMENT POSTOPERATIVE DIRECTIONS    Knee Rehabilitation, Guidelines Following Surgery  Results after knee surgery are often greatly improved when you follow the exercise, range of motion and muscle strengthening exercises prescribed by your doctor. Safety measures are also important to protect the knee from further injury. If any of these exercises cause you to have increased pain or swelling in your knee joint, decrease the amount until you are comfortable again and slowly increase them. If you have problems or questions, call your caregiver or physical therapist for advice.   HOME CARE INSTRUCTIONS  Remove items at home which could result in a fall. This includes throw rugs or furniture in walking pathways.  ICE to the affected knee as much as tolerated. Icing helps control swelling. If the swelling is well controlled you will be more comfortable and rehab easier. Continue to use ice on the knee for pain and swelling from surgery. You may notice swelling that will progress down to the foot and ankle. This is normal after surgery. Elevate the leg when you are not up walking on it.    Continue to use the breathing machine which will help keep your temperature down. It is common for your temperature to cycle up and down following surgery, especially at night when you are not up moving around and exerting yourself. The breathing machine keeps your lungs expanded and your temperature down. Do not place pillow under the operative knee, focus on keeping the knee straight while resting  DIET You may resume your previous home diet once you are discharged from the hospital.  DRESSING / WOUND CARE / SHOWERING Keep your bulky bandage on for 2 days. On the third post-operative day you may remove the Ace bandage and gauze. There is a waterproof  adhesive bandage on your skin which will stay in place until your first follow-up appointment. Once you remove this you will not need to place another bandage You may begin showering 3 days following surgery, but do not submerge the incision under water.  ACTIVITY For the first 5 days, the key is rest and control of pain and swelling Do your home exercises twice a day starting on post-operative day 3. On the days you go to physical therapy, just do the home exercises once that day. You should rest, ice and elevate the leg for 50 minutes out of every hour. Get up and walk/stretch for 10 minutes per hour. After 5 days you can increase your activity slowly as tolerated. Walk with your walker as instructed. Use the walker until you are comfortable transitioning to a cane. Walk with the cane in the opposite hand of the operative leg. You may discontinue the cane once you are comfortable and walking steadily. Avoid periods of inactivity such as sitting longer than an hour when not asleep. This helps prevent blood clots.  You may discontinue the knee immobilizer once you are able to perform a straight leg raise while lying down. You may resume a sexual relationship in one month or when given the OK by your doctor.  You may return to work once you are cleared by your doctor.  Do not drive a car for 6 weeks or until released by your surgeon.  Do not drive while taking narcotics.  TED HOSE STOCKINGS Wear the elastic stockings on both legs for three weeks following surgery during the  day. You may remove them at night for sleeping.  WEIGHT BEARING Weight bearing as tolerated with assist device (walker, cane, etc) as directed, use it as long as suggested by your surgeon or therapist, typically at least 4-6 weeks.  POSTOPERATIVE CONSTIPATION PROTOCOL Constipation - defined medically as fewer than three stools per week and severe constipation as less than one stool per week.  One of the most common issues  patients have following surgery is constipation.  Even if you have a regular bowel pattern at home, your normal regimen is likely to be disrupted due to multiple reasons following surgery.  Combination of anesthesia, postoperative narcotics, change in appetite and fluid intake all can affect your bowels.  In order to avoid complications following surgery, here are some recommendations in order to help you during your recovery period.  Colace (docusate) - Pick up an over-the-counter form of Colace or another stool softener and take twice a day as long as you are requiring postoperative pain medications.  Take with a full glass of water daily.  If you experience loose stools or diarrhea, hold the colace until you stool forms back up. If your symptoms do not get better within 1 week or if they get worse, check with your doctor. Dulcolax (bisacodyl) - Pick up over-the-counter and take as directed by the product packaging as needed to assist with the movement of your bowels.  Take with a full glass of water.  Use this product as needed if not relieved by Colace only.  MiraLax (polyethylene glycol) - Pick up over-the-counter to have on hand. MiraLax is a solution that will increase the amount of water in your bowels to assist with bowel movements.  Take as directed and can mix with a glass of water, juice, soda, coffee, or tea. Take if you go more than two days without a movement. Do not use MiraLax more than once per day. Call your doctor if you are still constipated or irregular after using this medication for 7 days in a row.  If you continue to have problems with postoperative constipation, please contact the office for further assistance and recommendations.  If you experience "the worst abdominal pain ever" or develop nausea or vomiting, please contact the office immediatly for further recommendations for treatment.  ITCHING If you experience itching with your medications, try taking only a single pain  pill, or even half a pain pill at a time.  You can also use Benadryl over the counter for itching or also to help with sleep.   MEDICATIONS See your medication summary on the "After Visit Summary" that the nursing staff will review with you prior to discharge.  You may have some home medications which will be placed on hold until you complete the course of blood thinner medication.  It is important for you to complete the blood thinner medication as prescribed by your surgeon.  Continue your approved medications as instructed at time of discharge.  PRECAUTIONS If you experience chest pain or shortness of breath - call 911 immediately for transfer to the hospital emergency department.  If you develop a fever greater that 101 F, purulent drainage from wound, increased redness or drainage from wound, foul odor from the wound/dressing, or calf pain - CONTACT YOUR SURGEON.  FOLLOW-UP APPOINTMENTS Make sure you keep all of your appointments after your operation with your surgeon and caregivers. You should call the office at the above phone number and make an appointment for approximately two weeks after the date of your surgery or on the date instructed by your surgeon outlined in the "After Visit Summary".  RANGE OF MOTION AND STRENGTHENING EXERCISES  Rehabilitation of the knee is important following a knee injury or an operation. After just a few days of immobilization, the muscles of the thigh which control the knee become weakened and shrink (atrophy). Knee exercises are designed to build up the tone and strength of the thigh muscles and to improve knee motion. Often times heat used for twenty to thirty minutes before working out will loosen up your tissues and help with improving the range of motion but do not use heat for the first two weeks following surgery. These exercises can be done on a training (exercise) mat, on the floor, on a table or on a bed.  Use what ever works the best and is most comfortable for you Knee exercises include:  Leg Lifts - While your knee is still immobilized in a splint or cast, you can do straight leg raises. Lift the leg to 60 degrees, hold for 3 sec, and slowly lower the leg. Repeat 10-20 times 2-3 times daily. Perform this exercise against resistance later as your knee gets better.  Quad and Hamstring Sets - Tighten up the muscle on the front of the thigh (Quad) and hold for 5-10 sec. Repeat this 10-20 times hourly. Hamstring sets are done by pushing the foot backward against an object and holding for 5-10 sec. Repeat as with quad sets.  Leg Slides: Lying on your back, slowly slide your foot toward your buttocks, bending your knee up off the floor (only go as far as is comfortable). Then slowly slide your foot back down until your leg is flat on the floor again. Angel Wings: Lying on your back spread your legs to the side as far apart as you can without causing discomfort.  A rehabilitation program following serious knee injuries can speed recovery and prevent re-injury in the future due to weakened muscles. Contact your doctor or a physical therapist for more information on knee rehabilitation.   POST-OPERATIVE OPIOID TAPER INSTRUCTIONS: It is important to wean off of your opioid medication as soon as possible. If you do not need pain medication after your surgery it is ok to stop day one. Opioids include: Codeine, Hydrocodone(Norco, Vicodin), Oxycodone(Percocet, oxycontin) and hydromorphone amongst others.  Long term and even short term use of opiods can cause: Increased pain response Dependence Constipation Depression Respiratory depression And more.  Withdrawal symptoms can include Flu like symptoms Nausea, vomiting And more Techniques to manage these symptoms Hydrate well Eat regular healthy meals Stay active Use relaxation techniques(deep breathing, meditating, yoga) Do Not substitute Alcohol to help  with tapering If you have been on opioids for less than two weeks and do not have pain than it is ok to stop all together.  Plan to wean off of opioids This plan should start within one week post op of your joint replacement. Maintain the same interval or time between taking each dose and first decrease the dose.  Cut the total daily intake of opioids by one tablet each day Next start to increase the time between doses. The last dose that should be eliminated is the evening dose.   IF YOU ARE TRANSFERRED TO  A SKILLED REHAB FACILITY If the patient is transferred to a skilled rehab facility following release from the hospital, a list of the current medications will be sent to the facility for the patient to continue.  When discharged from the skilled rehab facility, please have the facility set up the patient's Home Health Physical Therapy prior to being released. Also, the skilled facility will be responsible for providing the patient with their medications at time of release from the facility to include their pain medication, the muscle relaxants, and their blood thinner medication. If the patient is still at the rehab facility at time of the two week follow up appointment, the skilled rehab facility will also need to assist the patient in arranging follow up appointment in our office and any transportation needs.  MAKE SURE YOU:  Understand these instructions.  Get help right away if you are not doing well or get worse.   DENTAL ANTIBIOTICS:  In most cases prophylactic antibiotics for Dental procdeures after total joint surgery are not necessary.  Exceptions are as follows:  1. History of prior total joint infection  2. Severely immunocompromised (Organ Transplant, cancer chemotherapy, Rheumatoid biologic medications such as Humera)  3. Poorly controlled diabetes (A1C &gt; 8.0, blood glucose over 200)  If you have one of these conditions, contact your surgeon for an antibiotic  prescription, prior to your dental procedure.    Pick up stool softner and laxative for home use following surgery while on pain medications. Do not submerge incision under water. Please use good hand washing techniques while changing dressing each day. May shower starting three days after surgery. Please use a clean towel to pat the incision dry following showers. Continue to use ice for pain and swelling after surgery. Do not use any lotions or creams on the incision until instructed by your surgeon.  Information on my medicine - ELIQUIS (apixaban)  This medication education was reviewed with me or my healthcare representative as part of my discharge preparation.  The pharmacist that spoke with me during my hospital stay was:   Why was Eliquis prescribed for you? Eliquis was prescribed for you to reduce the risk of blood clots forming after orthopedic surgery.    What do You need to know about Eliquis? Take your Eliquis TWICE DAILY - one tablet in the morning and one tablet in the evening with or without food.  It would be best to take the dose about the same time each day.  If you have difficulty swallowing the tablet whole please discuss with your pharmacist how to take the medication safely.  Take Eliquis exactly as prescribed by your doctor and DO NOT stop taking Eliquis without talking to the doctor who prescribed the medication.  Stopping without other medication to take the place of Eliquis may increase your risk of developing a clot.  After discharge, you should have regular check-up appointments with your healthcare provider that is prescribing your Eliquis.  What do you do if you miss a dose? If a dose of ELIQUIS is not taken at the scheduled time, take it as soon as possible on the same day and twice-daily administration should be resumed.  The dose should not be doubled to make up for a missed dose.  Do not take more than one tablet of ELIQUIS at the same  time.  Important Safety Information A possible side effect of Eliquis is bleeding. You should call your healthcare provider right away if you experience any of the following:  Bleeding from an injury or your nose that does not stop. Unusual colored urine (red or dark brown) or unusual colored stools (red or black). Unusual bruising for unknown reasons. A serious fall or if you hit your head (even if there is no bleeding).  Some medicines may interact with Eliquis and might increase your risk of bleeding or clotting while on Eliquis. To help avoid this, consult your healthcare provider or pharmacist prior to using any new prescription or non-prescription medications, including herbals, vitamins, non-steroidal anti-inflammatory drugs (NSAIDs) and supplements.  This website has more information on Eliquis (apixaban): http://www.eliquis.com/eliquis/home

## 2021-12-21 NOTE — Anesthesia Procedure Notes (Signed)
Spinal  Patient location during procedure: OR Start time: 12/21/2021 8:20 AM End time: 12/21/2021 8:32 AM Reason for block: surgical anesthesia Staffing Performed: anesthesiologist  Anesthesiologist: Audry Pili, MD Performed by: Audry Pili, MD Authorized by: Audry Pili, MD   Preanesthetic Checklist Completed: patient identified, IV checked, risks and benefits discussed, surgical consent, monitors and equipment checked, pre-op evaluation and timeout performed Spinal Block Patient position: sitting Prep: DuraPrep Patient monitoring: heart rate, cardiac monitor, continuous pulse ox and blood pressure Approach: midline, right paramedian. Interspace: L2-3, L3-4. Injection technique: single-shot Needle Needle type: Quincke  Needle gauge: 22 G Needle length: 15 cm Assessment Events: failed spinal and second provider Additional Notes Consent was obtained prior to the procedure with all questions answered and concerns addressed. Risks including, but not limited to, bleeding, infection, nerve damage, paralysis, failed block, inadequate analgesia, allergic reaction, high spinal, itching, and headache were discussed and the patient wished to proceed. Functioning IV was confirmed and monitors were applied. Sterile prep and drape, including hand hygiene, mask, and sterile gloves were used. The patient was positioned and the spine was prepped. The skin was anesthetized with lidocaine. Unsuccessful attempt midline L2-3 by CRNA. Unsuccessful attempts by Dr. Fransisco Beau with longer 22ga Quincke both midline L3-4 and right paramedia L3-4. The procedure was aborted with plan to proceed with GA. The needle was carefully withdrawn. The patient tolerated the procedure well.   Renold Don, MD

## 2021-12-21 NOTE — Anesthesia Procedure Notes (Signed)
Anesthesia Regional Block: Adductor canal block   Pre-Anesthetic Checklist: , timeout performed,  Correct Patient, Correct Site, Correct Laterality,  Correct Procedure, Correct Position, site marked,  Risks and benefits discussed,  Surgical consent,  Pre-op evaluation,  At surgeon's request and post-op pain management  Laterality: Right  Prep: chloraprep       Needles:  Injection technique: Single-shot  Needle Type: Echogenic Needle     Needle Length: 10cm  Needle Gauge: 21     Additional Needles:   Narrative:  Start time: 12/21/2021 8:10 AM End time: 12/21/2021 8:07 AM Injection made incrementally with aspirations every 5 mL.  Performed by: Personally  Anesthesiologist: Audry Pili, MD  Additional Notes: No pain on injection. No increased resistance to injection. Injection made in 5cc increments. Good needle visualization. Patient tolerated the procedure well.

## 2021-12-21 NOTE — Transfer of Care (Signed)
Immediate Anesthesia Transfer of Care Note  Patient: [name]  Procedure(s) Performed: Procedure(s): TOTAL KNEE ARTHROPLASTY (Right)  Patient Location: PACU  Anesthesia Type:General  Level of Consciousness: Alert, Awake, Oriented  Airway & Oxygen Therapy: Patient Spontanous Breathing  Post-op Assessment: Report given to RN  Post vital signs: Reviewed and stable  Last Vitals:  Vitals:   12/21/21 0800 12/21/21 0805  BP:  (!) 142/82  Pulse: 84 80  Resp: 15 17  Temp:    SpO2: 417% 530%    Complications: No apparent anesthesia complications

## 2021-12-21 NOTE — Op Note (Signed)
OPERATIVE REPORT-TOTAL KNEE ARTHROPLASTY   Pre-operative diagnosis- Osteoarthritis  Right knee(s)  Post-operative diagnosis- Osteoarthritis Right knee(s)  Procedure-  Right  Total Knee Arthroplasty  Surgeon- Dione Plover. Arik Husmann, MD  Assistant- Molli Barrows, PA-C   Anesthesia-  GA combined with regional for post-op pain  EBL- 25 ml   Drains None  Tourniquet time-  Total Tourniquet Time Documented: Thigh (Right) - 31 minutes Total: Thigh (Right) - 31 minutes     Complications- None  Condition-PACU - hemodynamically stable.   Brief Clinical Note  [name] is a 61 y.o. year old female with end stage OA of her right knee with progressively worsening pain and dysfunction. She has constant pain, with activity and at rest and significant functional deficits with difficulties even with ADLs. She has had extensive non-op management including analgesics, injections of cortisone and viscosupplements, and home exercise program, but remains in significant pain with significant dysfunction.Radiographs show bone on bone arthritis medial and patellofemoral. She presents now for right Total Knee Arthroplasty.     Procedure in detail---   The patient is brought into the operating room and positioned supine on the operating table. After successful administration of  GA combined with regional for post-op pain,   a tourniquet is placed high on the  Right thigh(s) and the lower extremity is prepped and draped in the usual sterile fashion. Time out is performed by the operating team and then the  Right lower extremity is wrapped in Esmarch, knee flexed and the tourniquet inflated to 300 mmHg.       A midline incision is made with a ten blade through the subcutaneous tissue to the level of the extensor mechanism. A fresh blade is used to make a medial parapatellar arthrotomy. Soft tissue over the proximal medial tibia is subperiosteally elevated to the joint line with a knife and into the  semimembranosus bursa with a Cobb elevator. Soft tissue over the proximal lateral tibia is elevated with attention being paid to avoiding the patellar tendon on the tibial tubercle. The patella is everted, knee flexed 90 degrees and the ACL and PCL are removed. Findings are bone on bone medial and patellofemoral with large global osteophytes        The drill is used to create a starting hole in the distal femur and the canal is thoroughly irrigated with sterile saline to remove the fatty contents. The 5 degree Right  valgus alignment guide is placed into the femoral canal and the distal femoral cutting block is pinned to remove 9 mm off the distal femur. Resection is made with an oscillating saw.      The tibia is subluxed forward and the menisci are removed. The extramedullary alignment guide is placed referencing proximally at the medial aspect of the tibial tubercle and distally along the second metatarsal axis and tibial crest. The block is pinned to remove 13m off the more deficient medial  side. Resection is made with an oscillating saw. Size 4is the most appropriate size for the tibia and the proximal tibia is prepared with the modular drill and keel punch for that size.      The femoral sizing guide is placed and size 4 is most appropriate. Rotation is marked off the epicondylar axis and confirmed by creating a rectangular flexion gap at 90 degrees. The size 4 cutting block is pinned in this rotation and the anterior, posterior and chamfer cuts are made with the oscillating saw. The intercondylar block is then placed and  that cut is made.      Trial size 4 tibial component, trial size 4 posterior stabilized femur and a 8  mm posterior stabilized rotating platform insert trial is placed. Full extension is achieved with excellent varus/valgus and anterior/posterior balance throughout full range of motion. The patella is everted and thickness measured to be 22  mm. Free hand resection is taken to 12 mm, a  32 template is placed, lug holes are drilled, trial patella is placed, and it tracks normally. Osteophytes are removed off the posterior femur with the trial in place. All trials are removed and the cut bone surfaces prepared with pulsatile lavage. Cement is mixed and once ready for implantation, the size 4 tibial implant, size  4 posterior stabilized femoral component, and the size 32 patella are cemented in place and the patella is held with the clamp. The trial insert is placed and the knee held in full extension. The Exparel (20 ml mixed with 60 ml saline) is injected into the extensor mechanism, posterior capsule, medial and lateral gutters and subcutaneous tissues.  All extruded cement is removed and once the cement is hard the permanent 8 mm posterior stabilized rotating platform insert is placed into the tibial tray.      The wound is copiously irrigated with saline solution and the extensor mechanism closed with # 0 Stratofix suture. The tourniquet is released for a total tourniquet time of 30  minutes. Flexion against gravity is 140 degrees and the patella tracks normally. Subcutaneous tissue is closed with 2.0 vicryl and subcuticular with running 4.0 Monocryl. The incision is cleaned and dried and steri-strips and a bulky sterile dressing are applied. The limb is placed into a knee immobilizer and the patient is awakened and transported to recovery in stable condition.      Please note that a surgical assistant was a medical necessity for this procedure in order to perform it in a safe and expeditious manner. Surgical assistant was necessary to retract the ligaments and vital neurovascular structures to prevent injury to them and also necessary for proper positioning of the limb to allow for anatomic placement of the prosthesis.   Dione Plover Keltin Baird, MD    12/21/2021, 9:24 AM

## 2021-12-21 NOTE — Care Plan (Signed)
Ortho Bundle Case Management Note  Patient Details  Name: Deleah Tison MRN: [id number] Date of Birth: October 20, 1960  R TKA on 12-21-21 DCP:  Home with husband DME:  No needs, has a RW PT:  [facility] on 12-24-21                    DME Arranged:  N/A DME Agency:  NA  HH Arranged:  NA HH Agency:  NA  Additional Comments: Please contact me with any questions of if this plan should need to change.  Marianne Sofia, RN,CCM [facility]  [phone] 12/21/2021, 1:50 PM

## 2021-12-21 NOTE — Anesthesia Procedure Notes (Signed)
Procedure Name: LMA Insertion Date/Time: 12/21/2021 8:31 AM  Performed by: Gerald Leitz, CRNAPre-anesthesia Checklist: Patient identified, Patient being monitored, Timeout performed, Emergency Drugs available and Suction available Patient Re-evaluated:Patient Re-evaluated prior to induction Oxygen Delivery Method: Circle system utilized Preoxygenation: Pre-oxygenation with 100% oxygen Induction Type: IV induction Ventilation: Mask ventilation without difficulty LMA: LMA inserted LMA Size: 4.0 Tube type: Oral Number of attempts: 1 Placement Confirmation: positive ETCO2 and breath sounds checked- equal and bilateral Tube secured with: Tape Dental Injury: Teeth and Oropharynx as per pre-operative assessment

## 2021-12-21 NOTE — Evaluation (Signed)
Physical Therapy Evaluation Patient Details Name: [name] MRN: [id number] DOB: Aug 14, 1960 Today's Date: 12/21/2021  History of Present Illness  Pt is a 61yo female presenting s/p R-TKA on 12/21/21. PMH: GERD, HTN, HLD, L hip trochanteric bursitis s/p bursectomy & gluteal tendon repair, hx of b/l renal cell carcinoma, L-TKA 2009, anxiety & depression.   Clinical Impression  [name] is a 61 y.o. female POD 0 s/p R-TKA. Patient reports independence with mobility at baseline. Patient is now limited by functional impairments (see PT problem list below) and requires min assist for bed mobility and transfers. Patient was able to ambulate 8 feet with RW and min guard level of assist; pt reported dizziness, BP 101/66 HR 74, further mobility deferred. Patient instructed in exercise to facilitate ROM and circulation to manage edema. Provided incentive spirometer and with Vcs pt able to achieve 2224m. Patient will benefit from continued skilled PT interventions to address impairments and progress towards PLOF. Acute PT will follow to progress mobility and stair training in preparation for safe discharge home.       Recommendations for follow up therapy are one component of a multi-disciplinary discharge planning process, led by the attending physician.  Recommendations may be updated based on patient status, additional functional criteria and insurance authorization.  Follow Up Recommendations Follow physician's recommendations for discharge plan and follow up therapies      Assistance Recommended at Discharge Intermittent Supervision/Assistance  Patient can return home with the following  A little help with walking and/or transfers;A little help with bathing/dressing/bathroom;Assistance with cooking/housework;Help with stairs or ramp for entrance;Assist for transportation    Equipment Recommendations Rolling walker (2 wheels) (Youth RW, pt is 5'1")  Recommendations for Other  Services       Functional Status Assessment Patient has had a recent decline in their functional status and demonstrates the ability to make significant improvements in function in a reasonable and predictable amount of time.     Precautions / Restrictions Precautions Precautions: Knee Precaution Booklet Issued: No Precaution Comments: no pillow under the knee Restrictions Weight Bearing Restrictions: No RLE Weight Bearing: Weight bearing as tolerated Other Position/Activity Restrictions: wbat      Mobility  Bed Mobility Overal bed mobility: Needs Assistance Bed Mobility: Supine to Sit     Supine to sit: Min assist     General bed mobility comments: Min assist to bring RLE off bed.    Transfers Overall transfer level: Needs assistance Equipment used: Rolling walker (2 wheels) Transfers: Sit to/from Stand Sit to Stand: Min assist, From elevated surface           General transfer comment: Pt required min assist for steadying of RW and light steadying when pt transitioned hands from bed --> RW grips. Pt completed standing marches x2 and lateral weight shifts, no evidence of buckling.    Ambulation/Gait Ambulation/Gait assistance: Min guard, +2 safety/equipment Gait Distance (Feet): 8 Feet Assistive device: Rolling walker (2 wheels) Gait Pattern/deviations: Step-to pattern Gait velocity: decreased     General Gait Details: Pt ambulated 852fwith RW and min guard, no physical assist required or overt LOB noted, +2 for recliner follow. Pt reporting mild dizziness so directed pt to sit in recliner, BP 101/66, furthe rmobility deferred.  Stairs            Wheelchair Mobility    Modified Rankin (Stroke Patients Only)       Balance Overall balance assessment: Needs assistance Sitting-balance support: Feet supported, No upper extremity supported  Sitting balance-Leahy Scale: Good     Standing balance support: Reliant on assistive device for balance, During  functional activity, Bilateral upper extremity supported Standing balance-Leahy Scale: Poor                               Pertinent Vitals/Pain Pain Assessment Pain Assessment: 0-10 Pain Score: 5  Pain Location: right knee Pain Descriptors / Indicators: Operative site guarding, Discomfort Pain Intervention(s): Limited activity within patient's tolerance, Monitored during session, Repositioned, Ice applied    Home Living Family/patient expects to be discharged to:: Private residence Living Arrangements: Spouse/significant other Available Help at Discharge: Family;Available 24 hours/day Type of Home: House Home Access: Stairs to enter Entrance Stairs-Rails: Right Entrance Stairs-Number of Steps: 3   Home Layout: One level Home Equipment: Shower seat;Hand held shower head;Cane - single point      Prior Function Prior Level of Function : Independent/Modified Independent;Driving;Working/employed             Mobility Comments: ind ADLs Comments: ind     Hand Dominance        Extremity/Trunk Assessment   Upper Extremity Assessment Upper Extremity Assessment: Overall WFL for tasks assessed    Lower Extremity Assessment Lower Extremity Assessment: RLE deficits/detail;LLE deficits/detail RLE Deficits / Details: MMT ank DF/PF 5/5, no extensor lag noted, pt required increased time to complete SLR RLE Sensation: WNL LLE Deficits / Details: MMT ank DF/PF 5/5 LLE Sensation: WNL    Cervical / Trunk Assessment Cervical / Trunk Assessment: [name]  Communication   Communication: No difficulties  Cognition Arousal/Alertness: Awake/alert Behavior During Therapy: WFL for tasks assessed/performed Overall Cognitive Status: Within Functional Limits for tasks assessed                                          General Comments General comments (skin integrity, edema, etc.): Husband [name] present    Exercises Total Joint Exercises Ankle  Circles/Pumps: AROM, Both, 10 reps   Assessment/Plan    PT Assessment Patient needs continued PT services  PT Problem List Decreased strength;Decreased range of motion;Decreased activity tolerance;Decreased balance;Decreased mobility;Decreased coordination;Pain       PT Treatment Interventions DME instruction;Gait training;Stair training;Functional mobility training;Therapeutic activities;Therapeutic exercise;Balance training;Neuromuscular re-education;Patient/family education    PT Goals (Current goals can be found in the Care Plan section)  Acute Rehab PT Goals Patient Stated Goal: Walk in my neighborhood PT Goal Formulation: With patient Time For Goal Achievement: 12/28/21 Potential to Achieve Goals: Good    Frequency 7X/week     Co-evaluation               AM-PAC PT "6 Clicks" Mobility  Outcome Measure Help needed turning from your back to your side while in a flat bed without using bedrails?: None Help needed moving from lying on your back to sitting on the side of a flat bed without using bedrails?: A Little Help needed moving to and from a bed to a chair (including a wheelchair)?: A Little Help needed standing up from a chair using your arms (e.g., wheelchair or bedside chair)?: A Little Help needed to walk in hospital room?: A Little Help needed climbing 3-5 steps with a railing? : A Little 6 Click Score: 19    End of Session Equipment Utilized During Treatment: Gait belt Activity Tolerance: Patient tolerated treatment well;No increased pain  Patient left: in chair;with call bell/phone within reach;with chair alarm set;with nursing/sitter in room;with family/visitor present;with SCD's reapplied Nurse Communication: Mobility status PT Visit Diagnosis: Pain;Difficulty in walking, not elsewhere classified (R26.2) Pain - Right/Left: Right Pain - part of body: Knee    Time: 7955-8316 PT Time Calculation (min) (ACUTE ONLY): 29 min   Charges:   PT Evaluation $PT  Eval Low Complexity: 1 Low PT Treatments $Gait Training: 8-22 mins        Coolidge Breeze, PT, DPT [facility] Rehabilitation Department Office: [phone]  Coolidge Breeze 12/21/2021, 11:59 AM

## 2021-12-21 NOTE — Interval H&P Note (Signed)
History and Physical Interval Note:  12/21/2021 6:29 AM  [name]  has presented today for surgery, with the diagnosis of Right knee osteoarthritis.  The various methods of treatment have been discussed with the patient and family. After consideration of risks, benefits and other options for treatment, the patient has consented to  Procedure(s): TOTAL KNEE ARTHROPLASTY (Right) as a surgical intervention.  The patient's history has been reviewed, patient examined, no change in status, stable for surgery.  I have reviewed the patient's chart and labs.  Questions were answered to the patient's satisfaction.     Pilar Plate [facility]

## 2021-12-22 ENCOUNTER — Encounter (HOSPITAL_COMMUNITY): Payer: Self-pay | Admitting: Orthopedic Surgery

## 2021-12-22 DIAGNOSIS — M1711 Unilateral primary osteoarthritis, right knee: Secondary | ICD-10-CM | POA: Diagnosis not present

## 2021-12-22 DIAGNOSIS — I10 Essential (primary) hypertension: Secondary | ICD-10-CM | POA: Diagnosis not present

## 2021-12-22 DIAGNOSIS — Z96652 Presence of left artificial knee joint: Secondary | ICD-10-CM | POA: Diagnosis not present

## 2021-12-22 DIAGNOSIS — Z8616 Personal history of COVID-19: Secondary | ICD-10-CM | POA: Diagnosis not present

## 2021-12-22 DIAGNOSIS — J45909 Unspecified asthma, uncomplicated: Secondary | ICD-10-CM | POA: Diagnosis not present

## 2021-12-22 DIAGNOSIS — Z85528 Personal history of other malignant neoplasm of kidney: Secondary | ICD-10-CM | POA: Diagnosis not present

## 2021-12-22 LAB — CBC
HCT: 36.4 % (ref 36.0–46.0)
Hemoglobin: 11.5 g/dL — ABNORMAL LOW (ref 12.0–15.0)
MCH: 29.6 pg (ref 26.0–34.0)
MCHC: 31.6 g/dL (ref 30.0–36.0)
MCV: 93.8 fL (ref 80.0–100.0)
Platelets: 345 10*3/uL (ref 150–400)
RBC: 3.88 MIL/uL (ref 3.87–5.11)
RDW: 15 % (ref 11.5–15.5)
WBC: 12.9 10*3/uL — ABNORMAL HIGH (ref 4.0–10.5)
nRBC: 0 % (ref 0.0–0.2)

## 2021-12-22 LAB — BASIC METABOLIC PANEL
Anion gap: 8 (ref 5–15)
BUN: 17 mg/dL (ref 8–23)
CO2: 26 mmol/L (ref 22–32)
Calcium: 8.7 mg/dL — ABNORMAL LOW (ref 8.9–10.3)
Chloride: 101 mmol/L (ref 98–111)
Creatinine, Ser: 0.8 mg/dL (ref 0.44–1.00)
GFR, Estimated: >60 mL/min (ref >60)
Glucose, Bld: 194 mg/dL — ABNORMAL HIGH (ref 70–99)
Potassium: 3.8 mmol/L (ref 3.5–5.1)
Sodium: 135 mmol/L (ref 135–145)

## 2021-12-22 MED ORDER — METHOCARBAMOL 500 MG PO TABS: 500.0000 mg | ORAL_TABLET | Freq: Four times a day (QID) | ORAL | 0 refills | Status: DC | PRN

## 2021-12-22 MED ORDER — SODIUM CHLORIDE 0.9 % IV BOLUS
500.0000 mL | Freq: Once | INTRAVENOUS | Status: AC
  Administered 2021-12-22: 500 mL via INTRAVENOUS

## 2021-12-22 MED ORDER — GABAPENTIN 300 MG PO CAPS: 300.0000 mg | ORAL_CAPSULE | ORAL | 0 refills | Status: DC

## 2021-12-22 MED ORDER — OXYCODONE HCL 5 MG PO TABS: 5.0000 mg | ORAL_TABLET | Freq: Four times a day (QID) | ORAL | 0 refills | Status: DC | PRN

## 2021-12-22 MED ORDER — APIXABAN 2.5 MG PO TABS: 2.5000 mg | ORAL_TABLET | Freq: Two times a day (BID) | ORAL | 0 refills | Status: DC

## 2021-12-22 NOTE — TOC Transition Note (Signed)
Transition of Care [facility]) - CM/SW Discharge Note   Patient Details  Name: [name] MRN: [id number] Date of Birth: 10/20/1960  Transition of Care [facility]) CM/SW Contact:  Lennart Pall, LCSW Phone Number: 12/22/2021, 10:48 AM   Clinical Narrative:    Met with pt and confirming she has received youth RW via [facility].  OPPT arranged with [facility].  No further TOC needs.   Final next level of care: OP Rehab Barriers to Discharge: No Barriers Identified   Patient Goals and CMS Choice Patient states their goals for this hospitalization and ongoing recovery are:: return home      Discharge Placement                       Discharge Plan and Services                DME Arranged: Gilford Rile youth DME Agency: [employer] Date DME Agency Contacted: 12/22/21   Representative spoke with at DME Agency: Wells Guiles HH Arranged: NA [place] Agency: NA        Social Determinants of Health (SDOH) Interventions Food Insecurity Interventions: Intervention Not Indicated Housing Interventions: Intervention Not Indicated Transportation Interventions: Intervention Not Indicated Utilities Interventions: Intervention Not Indicated   Readmission Risk Interventions     No data to display

## 2021-12-22 NOTE — Discharge Summary (Signed)
Physician Discharge Summary   Patient ID: Brianne Maina MRN: [id number] DOB/AGE: 10-30-1960 61 y.o.  Admit date: 12/21/2021 Discharge date: 12/22/2021  Primary Diagnosis: Osteoarthritis, right knee   Admission Diagnoses:  Past Medical History:  Diagnosis Date   Anxiety    psychiatrist: Dr. Robina Ade    Arthritis    Asthma    Bronchitis    Colitis    Colon polyps    COVID-19 09/2020   Depression    psychiatrist: Dr. Robina Ade   Elevated liver enzymes    Dr. Rulon Abide following   Fatty liver    GERD (gastroesophageal reflux disease)    Heart murmur    History of chicken pox    History of kidney stones    Hypertension    Primary osteoarthritis of both knees 04/09/2011   Formatting of this note might be different from the original. Overview:  S/p left total knee replacement   Renal cell carcinoma    hx of left and right; Dr. Nena Alexander   Trochanteric bursitis, left hip 09/27/2018   Discharge Diagnoses:   Principal Problem:   Osteoarthritis of right knee  Estimated body mass index is 36.66 kg/m as calculated from the following:   Height as of this encounter: '5\' 1"'$  (1.549 m).   Weight as of this encounter: 88 kg.  Procedure:  Procedure(s) (LRB): TOTAL KNEE ARTHROPLASTY (Right)   Consults: None  HPI: [name] is a 61 y.o. year old female with end stage OA of her right knee with progressively worsening pain and dysfunction. She has constant pain, with activity and at rest and significant functional deficits with difficulties even with ADLs. She has had extensive non-op management including analgesics, injections of cortisone and viscosupplements, and home exercise program, but remains in significant pain with significant dysfunction.Radiographs show bone on bone arthritis medial and patellofemoral. She presents now for right Total Knee Arthroplasty.     Laboratory Data: Admission on 12/21/2021, Discharged on 12/22/2021  Component Date Value Ref Range Status   WBC  12/22/2021 12.9 (H)  4.0 - 10.5 K/uL Final   RBC 12/22/2021 3.88  3.87 - 5.11 MIL/uL Final   Hemoglobin 12/22/2021 11.5 (L)  12.0 - 15.0 g/dL Final   HCT 12/22/2021 36.4  36.0 - 46.0 % Final   MCV 12/22/2021 93.8  80.0 - 100.0 fL Final   MCH 12/22/2021 29.6  26.0 - 34.0 pg Final   MCHC 12/22/2021 31.6  30.0 - 36.0 g/dL Final   RDW 12/22/2021 15.0  11.5 - 15.5 % Final   Platelets 12/22/2021 345  150 - 400 K/uL Final   nRBC 12/22/2021 0.0  0.0 - 0.2 % Final   Performed at [facility], [place] [address], [place], [place] [address]   Sodium 12/22/2021 135  135 - 145 mmol/L Final   Potassium 12/22/2021 3.8  3.5 - 5.1 mmol/L Final   Chloride 12/22/2021 101  98 - 111 mmol/L Final   CO2 12/22/2021 26  22 - 32 mmol/L Final   Glucose, Bld 12/22/2021 194 (H)  70 - 99 mg/dL Final   Glucose reference range applies only to samples taken after fasting for at least 8 hours.   BUN 12/22/2021 17  8 - 23 mg/dL Final   Creatinine, Ser 12/22/2021 0.80  0.44 - 1.00 mg/dL Final   Calcium 12/22/2021 8.7 (L)  8.9 - 10.3 mg/dL Final   GFR, Estimated 12/22/2021 >60  >60 mL/min Final   Comment: (NOTE) Calculated using the CKD-EPI Creatinine Equation (  2021)    Anion gap 12/22/2021 8  5 - 15 Final   Performed at [facility], [place] [address], [place], [place] [address]  Hospital Outpatient Visit on 12/08/2021  Component Date Value Ref Range Status   MRSA, PCR 12/08/2021 NEGATIVE  NEGATIVE Final   Staphylococcus aureus 12/08/2021 NEGATIVE  NEGATIVE Final   Comment: (NOTE) The Xpert SA Assay (FDA approved for NASAL specimens in patients 61 years of age and older), is one component of a comprehensive surveillance program. It is not intended to diagnose infection nor to guide or monitor treatment. Performed at [facility], [place] [address], [place], [place] [address]    Sodium 12/08/2021 138  135 - 145 mmol/L Final   Potassium 12/08/2021 3.9  3.5 - 5.1 mmol/L  Final   Chloride 12/08/2021 105  98 - 111 mmol/L Final   CO2 12/08/2021 26  22 - 32 mmol/L Final   Glucose, Bld 12/08/2021 100 (H)  70 - 99 mg/dL Final   Glucose reference range applies only to samples taken after fasting for at least 8 hours.   BUN 12/08/2021 18  8 - 23 mg/dL Final   Creatinine, Ser 12/08/2021 0.78  0.44 - 1.00 mg/dL Final   Calcium 12/08/2021 9.6  8.9 - 10.3 mg/dL Final   Total Protein 12/08/2021 7.5  6.5 - 8.1 g/dL Final   Albumin 12/08/2021 3.6  3.5 - 5.0 g/dL Final   AST 12/08/2021 31  15 - 41 U/L Final   ALT 12/08/2021 45 (H)  0 - 44 U/L Final   Alkaline Phosphatase 12/08/2021 115  38 - 126 U/L Final   Total Bilirubin 12/08/2021 0.5  0.3 - 1.2 mg/dL Final   GFR, Estimated 12/08/2021 >60  >60 mL/min Final   Comment: (NOTE) Calculated using the CKD-EPI Creatinine Equation (2021)    Anion gap 12/08/2021 7  5 - 15 Final   Performed at [facility], [place] [address], [place], [place] [address]   WBC 12/08/2021 6.5  4.0 - 10.5 K/uL Final   RBC 12/08/2021 4.51  3.87 - 5.11 MIL/uL Final   Hemoglobin 12/08/2021 13.2  12.0 - 15.0 g/dL Final   HCT 12/08/2021 41.9  36.0 - 46.0 % Final   MCV 12/08/2021 92.9  80.0 - 100.0 fL Final   MCH 12/08/2021 29.3  26.0 - 34.0 pg Final   MCHC 12/08/2021 31.5  30.0 - 36.0 g/dL Final   RDW 12/08/2021 14.7  11.5 - 15.5 % Final   Platelets 12/08/2021 357  150 - 400 K/uL Final   nRBC 12/08/2021 0.0  0.0 - 0.2 % Final   Performed at [facility], [place] [address] [place], [place] [address]     X-Rays:No results found.  EKG: Orders placed or performed during the hospital encounter of 12/08/21   EKG 12 lead per protocol   EKG 12 lead per protocol     Hospital Course: Fredrika Canby is a 61 y.o. who was admitted to [facility]. They were brought to the operating room on 12/21/2021 and underwent Procedure(s): TOTAL KNEE ARTHROPLASTY.  Patient tolerated the procedure well and was later  transferred to the recovery room and then to the orthopaedic floor for postoperative care. They were given PO and IV analgesics for pain control following their surgery. They were given 24 hours of postoperative antibiotics of  Anti-infectives (From admission, onward)    Start     Dose/Rate Route Frequency Ordered Stop  12/21/21 1400  ceFAZolin (ANCEF) IVPB 2g/100 mL premix        2 g 200 mL/hr over 30 Minutes Intravenous Every 6 hours 12/21/21 1043 12/22/21 0953   12/21/21 0615  ceFAZolin (ANCEF) IVPB 2g/100 mL premix        2 g 200 mL/hr over 30 Minutes Intravenous On call to O.R. 12/21/21 9983 12/21/21 0843      and started on DVT prophylaxis in the form of  Eliquis .   PT and OT were ordered for total joint protocol. Discharge planning consulted to help with postop disposition and equipment needs.  Patient had a fair night on the evening of surgery. They started to get up OOB with therapy on POD #0. Patient was seen during rounds and was ready to go home pending progress with therapy. She worked with therapy on POD #1 and was meeting her goals. Patient was discharged to home later that day in stable condition.  Diet: Regular diet Activity: WBAT Follow-up: in 2 weeks Disposition: Home Discharged Condition: stable   Discharge Instructions     Call MD / Call 911   Complete by: As directed    If you experience chest pain or shortness of breath, CALL 911 and be transported to the hospital emergency room.  If you develope a fever above 101 F, pus (white drainage) or increased drainage or redness at the wound, or calf pain, call your surgeon's office.   Change dressing   Complete by: As directed    You may remove the bulky bandage (ACE wrap and gauze) two days after surgery. You will have an adhesive waterproof bandage underneath. Leave this in place until your first follow-up appointment.   Constipation Prevention   Complete by: As directed    Drink plenty of fluids.  Prune juice may be  helpful.  You may use a stool softener, such as Colace (over the counter) 100 mg twice a day.  Use MiraLax (over the counter) for constipation as needed.   Diet - low sodium heart healthy   Complete by: As directed    Do not put a pillow under the knee. Place it under the heel.   Complete by: As directed    Driving restrictions   Complete by: As directed    No driving for two weeks   Post-operative opioid taper instructions:   Complete by: As directed    POST-OPERATIVE OPIOID TAPER INSTRUCTIONS: It is important to wean off of your opioid medication as soon as possible. If you do not need pain medication after your surgery it is ok to stop day one. Opioids include: Codeine, Hydrocodone(Norco, Vicodin), Oxycodone(Percocet, oxycontin) and hydromorphone amongst others.  Long term and even short term use of opiods can cause: Increased pain response Dependence Constipation Depression Respiratory depression And more.  Withdrawal symptoms can include Flu like symptoms Nausea, vomiting And more Techniques to manage these symptoms Hydrate well Eat regular healthy meals Stay active Use relaxation techniques(deep breathing, meditating, yoga) Do Not substitute Alcohol to help with tapering If you have been on opioids for less than two weeks and do not have pain than it is ok to stop all together.  Plan to wean off of opioids This plan should start within one week post op of your joint replacement. Maintain the same interval or time between taking each dose and first decrease the dose.  Cut the total daily intake of opioids by one tablet each day Next start to increase the time between doses.  The last dose that should be eliminated is the evening dose.      TED hose   Complete by: As directed    Use stockings (TED hose) for three weeks on both leg(s).  You may remove them at night for sleeping.   Weight bearing as tolerated   Complete by: As directed       Allergies as of 12/22/2021        Reactions   Fosamax [alendronate] Other (See Comments)   Severe GERD        Medication List     STOP taking these medications    ibuprofen 200 MG tablet Commonly known as: ADVIL       TAKE these medications    Advair Diskus 100-50 MCG/ACT Aepb Generic drug: fluticasone-salmeterol USE 1 INHALATION TWO TIMES A DAY THEN RINSE MOUTH   albuterol 108 (90 Base) MCG/ACT inhaler Commonly known as: VENTOLIN HFA Inhale 1-2 puffs into the lungs every 6 (six) hours as needed for shortness of breath.   albuterol (2.5 MG/3ML) 0.083% nebulizer solution Commonly known as: PROVENTIL Take 3 mLs (2.5 mg total) by nebulization every 6 (six) hours as needed for wheezing or shortness of breath.   ALPRAZolam 0.5 MG tablet Commonly known as: XANAX Take 0.5 mg by mouth 2 (two) times daily as needed for anxiety.   apixaban 2.5 MG Tabs tablet Commonly known as: ELIQUIS Take 1 tablet (2.5 mg total) by mouth 2 (two) times daily for 20 days.   azithromycin 250 MG tablet Commonly known as: ZITHROMAX 2 today then one daily   gabapentin 300 MG capsule Commonly known as: NEURONTIN Take 1 capsule (300 mg total) by mouth as directed. Take a 300 mg capsule three times a day for two weeks following surgery.Then take a 300 mg capsule two times a day for two weeks. Then take a 300 mg capsule once a day for two weeks. Then discontinue.   hydrochlorothiazide 25 MG tablet Commonly known as: HYDRODIURIL Take 1 tablet (25 mg total) by mouth daily.   MAGNESIUM PO Take 1 tablet by mouth in the morning.   methocarbamol 500 MG tablet Commonly known as: ROBAXIN Take 1 tablet (500 mg total) by mouth every 6 (six) hours as needed for muscle spasms.   montelukast 10 MG tablet Commonly known as: SINGULAIR Take 1 tablet (10 mg total) by mouth at bedtime.   OLANZapine-FLUoxetine 3-25 MG capsule Commonly known as: SYMBYAX Take 1 capsule by mouth at bedtime.   oxyCODONE 5 MG immediate release  tablet Commonly known as: Oxy IR/ROXICODONE Take 1-2 tablets (5-10 mg total) by mouth every 6 (six) hours as needed for severe pain.   potassium chloride SA 20 MEQ tablet Commonly known as: KLOR-CON M Take 1 tablet (20 mEq total) by mouth 2 (two) times daily. What changed:  how much to take when to take this   PSYLLIUM HUSK PO Take 2 capsules by mouth in the morning and at bedtime.   RED YEAST RICE PO Take 2 capsules by mouth in the morning. Bluebonnet CholesteRice Supplement (Red yeast rice/Plant Sterols/Pantethine/CoQ10/Policosanol)   VITAMIN D-3 PO Take 1 tablet by mouth in the morning.               Discharge Care Instructions  (From admission, onward)           Start     Ordered   12/22/21 0000  Weight bearing as tolerated        12/22/21 0750   12/22/21  0000  Change dressing       Comments: You may remove the bulky bandage (ACE wrap and gauze) two days after surgery. You will have an adhesive waterproof bandage underneath. Leave this in place until your first follow-up appointment.   12/22/21 0750            Follow-up Information     Gaynelle Arabian, MD. Go on 01/05/2022.   Specialty: Orthopedic Surgery Why: You are scheduled for a follow up appointment on 01-05-22 on 2:30 pm. Contact information: [address] [place] [address] [phone]                 Signed: R. Jaynie Bream, PA-C Orthopedic Surgery 12/22/2021, 8:11 PM

## 2021-12-22 NOTE — Progress Notes (Signed)
Physical Therapy Treatment Patient Details Name: [name] MRN: [id number] DOB: 1960/09/23 Today's Date: 12/22/2021   History of Present Illness Pt is a 61yo female presenting s/p R-TKA on 12/21/21. PMH: GERD, HTN, HLD, L hip trochanteric bursitis s/p bursectomy & gluteal tendon repair, hx of b/l renal cell carcinoma, L-TKA 2009, anxiety & depression.    PT Comments    POD # 1 pm session Assisted with amb an increased distance in hallway, practiced stairs again with Spouse then  Addressed all mobility questions, discussed appropriate activity, educated on use of ICE.  Pt ready for D/C to home.   Recommendations for follow up therapy are one component of a multi-disciplinary discharge planning process, led by the attending physician.  Recommendations may be updated based on patient status, additional functional criteria and insurance authorization.  Follow Up Recommendations  Follow physician's recommendations for discharge plan and follow up therapies     Assistance Recommended at Discharge Intermittent Supervision/Assistance  Patient can return home with the following A little help with walking and/or transfers;A little help with bathing/dressing/bathroom;Assistance with cooking/housework;Help with stairs or ramp for entrance;Assist for transportation   Equipment Recommendations  Rolling walker (2 wheels) ([employer])    Recommendations for Other Services       Precautions / Restrictions Precautions Precautions: Knee Precaution Comments: no pillow under the knee Restrictions Weight Bearing Restrictions: No RLE Weight Bearing: Weight bearing as tolerated     Mobility  Bed Mobility               General bed mobility comments: OOB in recliner    Transfers Overall transfer level: Needs assistance     Sit to Stand: Supervision, Min guard           General transfer comment: 50% VC's on proper hand placemtn to avoid pulling up on walker.  25% VC's safety  with turns.    Ambulation/Gait Ambulation/Gait assistance: Supervision, Min guard Gait Distance (Feet): 55 Feet Assistive device: Rolling walker (2 wheels) Gait Pattern/deviations: Step-to pattern Gait velocity: decreased     General Gait Details: 25% VC's on proper walker to self dustance as well as safety with turns.  Had Spouse "hands on" assist using safety belt.   Stairs Stairs: Yes Stairs assistance: Min guard, Min assist Stair Management: One rail Right, Step to pattern, Forwards Number of Stairs: 6 General stair comments: with Spouse "hands on" using safety belt with NO VC's on proper sequencing as well as safe handling.  Performed 3 steps with one rail TWICE.   Wheelchair Mobility    Modified Rankin (Stroke Patients Only)       Balance                                            Cognition Arousal/Alertness: Awake/alert Behavior During Therapy: WFL for tasks assessed/performed Overall Cognitive Status: Within Functional Limits for tasks assessed                                 General Comments: AxO x 3 very pleasant and motivated        Exercises  05 reps all seated TE's following HEP handout.     General Comments        Pertinent Vitals/Pain Pain Assessment Pain Assessment: 0-10 Pain Score: 3  Pain Location: right knee Pain  Descriptors / Indicators: Operative site guarding, Discomfort Pain Intervention(s): Monitored during session, Premedicated before session, Repositioned, Ice applied    Home Living                          Prior Function            PT Goals (current goals can now be found in the care plan section) Progress towards PT goals: Progressing toward goals    Frequency    7X/week      PT Plan Current plan remains appropriate    Co-evaluation              AM-PAC PT "6 Clicks" Mobility   Outcome Measure  Help needed turning from your back to your side while in a flat  bed without using bedrails?: None Help needed moving from lying on your back to sitting on the side of a flat bed without using bedrails?: None Help needed moving to and from a bed to a chair (including a wheelchair)?: A Little Help needed standing up from a chair using your arms (e.g., wheelchair or bedside chair)?: A Little Help needed to walk in hospital room?: A Little Help needed climbing 3-5 steps with a railing? : A Little 6 Click Score: 20    End of Session Equipment Utilized During Treatment: Gait belt Activity Tolerance: Patient tolerated treatment well;No increased pain Patient left: in chair;with call bell/phone within reach;with chair alarm set;with nursing/sitter in room;with family/visitor present;with SCD's reapplied Nurse Communication: Mobility status PT Visit Diagnosis: Pain;Difficulty in walking, not elsewhere classified (R26.2) Pain - Right/Left: Right Pain - part of body: Knee     Time: 5183-3582 PT Time Calculation (min) (ACUTE ONLY): 14 min  Charges:  $Gait Training: 8-22 mins                      Rica Koyanagi  PTA [facility] M-F          [phone] Weekend pager [phone]

## 2021-12-22 NOTE — Plan of Care (Signed)
Plan of care reviewed and discussed with the patient. 

## 2021-12-22 NOTE — Progress Notes (Signed)
Physical Therapy Treatment Patient Details Name: [name] MRN: [id number] DOB: Jul 27, 1960 Today's Date: 12/22/2021   History of Present Illness Pt is a 61yo female presenting s/p R-TKA on 12/21/21. PMH: GERD, HTN, HLD, L hip trochanteric bursitis s/p bursectomy & gluteal tendon repair, hx of b/l renal cell carcinoma, L-TKA 2009, anxiety & depression.    PT Comments    POD ! 1 am session Pt AxO x 3 pleasant and eager to D/C to home today.  Assisted with amb in hallway.  General transfer comment: 50% VC's on proper hand placemtn to avoid pulling up on walker.  25% VC's safety with turns. General Gait Details: 50% VC's on proper walker to self dustance as well as safety with turns.  Had Spouse "hands on" assist using safety belt. General stair comments: with Spouse "hands on" using safety belt with 50% VC's on proper sequencing as well as safe handling.  Performed 3 steps with one rail TWICE. Then returned to room to perform some TE's following HEP handout.  Instructed on proper tech, freq as well as use of ICE.   Will see pt again for a seconf session to complete HEP Education and repeat stair training with Spouse to ensure a safe D/C to home.   Recommendations for follow up therapy are one component of a multi-disciplinary discharge planning process, led by the attending physician.  Recommendations may be updated based on patient status, additional functional criteria and insurance authorization.  Follow Up Recommendations  Follow physician's recommendations for discharge plan and follow up therapies     Assistance Recommended at Discharge Intermittent Supervision/Assistance  Patient can return home with the following A little help with walking and/or transfers;A little help with bathing/dressing/bathroom;Assistance with cooking/housework;Help with stairs or ramp for entrance;Assist for transportation   Equipment Recommendations  Rolling walker (2 wheels) (YOUTH)    Recommendations  for Other Services       Precautions / Restrictions Precautions Precautions: Knee Precaution Comments: no pillow under the knee Restrictions Weight Bearing Restrictions: No RLE Weight Bearing: Weight bearing as tolerated     Mobility  Bed Mobility               General bed mobility comments: OOB in recliner    Transfers Overall transfer level: Needs assistance     Sit to Stand: Supervision, Min guard           General transfer comment: 50% VC's on proper hand placemtn to avoid pulling up on walker.  25% VC's safety with turns.    Ambulation/Gait Ambulation/Gait assistance: Supervision, Min guard Gait Distance (Feet): 45 Feet Assistive device: Rolling walker (2 wheels) Gait Pattern/deviations: Step-to pattern Gait velocity: decreased     General Gait Details: 50% VC's on proper walker to self dustance as well as safety with turns.  Had Spouse "hands on" assist using safety belt.   Stairs Stairs: Yes Stairs assistance: Min guard, Min assist Stair Management: One rail Right, Step to pattern, Forwards Number of Stairs: 6 General stair comments: with Spouse "hands on" using safety belt with 50% VC's on proper sequencing as well as safe handling.  Performed 3 steps with one rail TWICE.   Wheelchair Mobility    Modified Rankin (Stroke Patients Only)       Balance  Cognition Arousal/Alertness: Awake/alert Behavior During Therapy: WFL for tasks assessed/performed Overall Cognitive Status: Within Functional Limits for tasks assessed                                 General Comments: AxO x 3 very pleasant and motivated        Exercises   Total Knee Replacement TE's following HEP handout 10 reps B LE ankle pumps 05 reps towel squeezes 05 reps knee presses 05 reps heel slides  05 reps SAQ's 05 reps SLR's 05 reps ABD Educated on use of gait belt to assist with  TE's Followed by ICE    General Comments        Pertinent Vitals/Pain Pain Assessment Pain Assessment: 0-10 Pain Score: 3  Pain Location: right knee Pain Descriptors / Indicators: Operative site guarding, Discomfort Pain Intervention(s): Monitored during session, Premedicated before session, Repositioned, Ice applied    Home Living                          Prior Function            PT Goals (current goals can now be found in the care plan section) Progress towards PT goals: Progressing toward goals    Frequency    7X/week      PT Plan Current plan remains appropriate    Co-evaluation              AM-PAC PT "6 Clicks" Mobility   Outcome Measure  Help needed turning from your back to your side while in a flat bed without using bedrails?: None Help needed moving from lying on your back to sitting on the side of a flat bed without using bedrails?: None Help needed moving to and from a bed to a chair (including a wheelchair)?: A Little Help needed standing up from a chair using your arms (e.g., wheelchair or bedside chair)?: A Little Help needed to walk in hospital room?: A Little Help needed climbing 3-5 steps with a railing? : A Little 6 Click Score: 20    End of Session Equipment Utilized During Treatment: Gait belt Activity Tolerance: Patient tolerated treatment well;No increased pain Patient left: in chair;with call bell/phone within reach;with chair alarm set;with nursing/sitter in room;with family/visitor present;with SCD's reapplied Nurse Communication: Mobility status PT Visit Diagnosis: Pain;Difficulty in walking, not elsewhere classified (R26.2) Pain - Right/Left: Right Pain - part of body: Knee     Time: 0926-0950 PT Time Calculation (min) (ACUTE ONLY): 24 min  Charges:  $Gait Training: 8-22 mins $Therapeutic Exercise: 8-22 mins                     Rica Koyanagi  PTA [facility] M-F           [phone] Weekend pager [phone]

## 2021-12-22 NOTE — Progress Notes (Signed)
Subjective: 1 Day Post-Op Procedure(s) (LRB): TOTAL KNEE ARTHROPLASTY (Right) Patient seen in rounds by Dr. Wynelle Link. Patient is well, and has had no acute complaints or problems. Denies SOB or chest pain. Denies dizziness or lightheadedness. Denies calf pain. Voiding without difficulty. Patient reports pain as mild.  Worked with physical therapy yesterday and ambulated 8'. We will continue physical therapy today.  Objective: Vital signs in last 24 hours: Temp:  [97.8 F (36.6 C)-99.1 F (37.3 C)] 98 F (36.7 C) (09/19 0559) Pulse Rate:  [60-84] 68 (09/19 0559) Resp:  [12-20] 17 (09/19 0559) BP: (99-147)/(52-90) 99/52 (09/19 0559) SpO2:  [93 %-100 %] 95 % (09/19 0559)  Intake/Output from previous day:  Intake/Output Summary (Last 24 hours) at 12/22/2021 0743 Last data filed at 12/22/2021 0200 Gross per 24 hour  Intake 1995 ml  Output 460 ml  Net 1535 ml     Intake/Output this shift: No intake/output data recorded.  Labs: Recent Labs    12/22/21 0352  HGB 11.5*   Recent Labs    12/22/21 0352  WBC 12.9*  RBC 3.88  HCT 36.4  PLT 345   Recent Labs    12/22/21 0352  NA 135  K 3.8  CL 101  CO2 26  BUN 17  CREATININE 0.80  GLUCOSE 194*  CALCIUM 8.7*   No results for input(s): "LABPT", "INR" in the last 72 hours.  Exam: General - Patient is Alert and Oriented Extremity - Neurologically intact Neurovascular intact Sensation intact distally Dorsiflexion/Plantar flexion intact Dressing - dressing C/D/I Motor Function - intact, moving foot and toes well on exam.  Past Medical History:  Diagnosis Date   Anxiety    psychiatrist: Dr. Robina Ade    Arthritis    Asthma    Bronchitis    Colitis    Colon polyps    COVID-19 09/2020   Depression    psychiatrist: Dr. Robina Ade   Elevated liver enzymes    Dr. Rulon Abide following   Fatty liver    GERD (gastroesophageal reflux disease)    Heart murmur    History of chicken pox    History of kidney stones     Hypertension    Primary osteoarthritis of both knees 04/09/2011   Formatting of this note might be different from the original. Overview:  S/p left total knee replacement   Renal cell carcinoma    hx of left and right; Dr. Nena Alexander   Trochanteric bursitis, left hip 09/27/2018    Assessment/Plan: 1 Day Post-Op Procedure(s) (LRB): TOTAL KNEE ARTHROPLASTY (Right) Principal Problem:   Osteoarthritis of right knee  Estimated body mass index is 36.66 kg/m as calculated from the following:   Height as of this encounter: '5\' 1"'$  (1.549 m).   Weight as of this encounter: 88 kg. Advance diet Up with therapy D/C IV fluids  Patient's anticipated LOS is less than 2 midnights, meeting these requirements: - Younger than 3 - Lives within 1 hour of care - Has a competent adult at home to recover with post-op - NO history of  - Chronic pain requiring opiods  - Diabetes  - Coronary Artery Disease  - Heart failure  - Heart attack  - Stroke  - DVT/VTE  - Cardiac arrhythmia  - Respiratory Failure/COPD  - Anemia  - Advanced Liver disease  DVT Prophylaxis -  Eliquis Weight bearing as tolerated. Continue physical therapy.  Patient with soft blood pressure but asymptomatic. Will plan 500 mL bolus this morning. Continue to monitor vitals.  Plan is to go Home after hospital stay. Expected discharge today pending progress with physical therapy and meeting patient goals. Scheduled for OPPT at [facility]. Follow-up in clinic in 2 weeks.  The PDMP database was reviewed today prior to any opioid medications being prescribed to this patient.  R. Jaynie Bream, PA-C Orthopedic Surgery [phone] 12/22/2021, 7:43 AM

## 2021-12-24 DIAGNOSIS — M25661 Stiffness of right knee, not elsewhere classified: Secondary | ICD-10-CM | POA: Diagnosis not present

## 2021-12-24 DIAGNOSIS — M25561 Pain in right knee: Secondary | ICD-10-CM | POA: Diagnosis not present

## 2021-12-28 DIAGNOSIS — M25561 Pain in right knee: Secondary | ICD-10-CM | POA: Diagnosis not present

## 2021-12-28 DIAGNOSIS — M25661 Stiffness of right knee, not elsewhere classified: Secondary | ICD-10-CM | POA: Diagnosis not present

## 2021-12-30 DIAGNOSIS — M25661 Stiffness of right knee, not elsewhere classified: Secondary | ICD-10-CM | POA: Diagnosis not present

## 2021-12-30 DIAGNOSIS — M25561 Pain in right knee: Secondary | ICD-10-CM | POA: Diagnosis not present

## 2022-01-01 DIAGNOSIS — M25561 Pain in right knee: Secondary | ICD-10-CM | POA: Diagnosis not present

## 2022-01-01 DIAGNOSIS — M25661 Stiffness of right knee, not elsewhere classified: Secondary | ICD-10-CM | POA: Diagnosis not present

## 2022-01-04 DIAGNOSIS — M25561 Pain in right knee: Secondary | ICD-10-CM | POA: Diagnosis not present

## 2022-01-04 DIAGNOSIS — M25661 Stiffness of right knee, not elsewhere classified: Secondary | ICD-10-CM | POA: Diagnosis not present

## 2022-01-06 DIAGNOSIS — M25561 Pain in right knee: Secondary | ICD-10-CM | POA: Diagnosis not present

## 2022-01-06 DIAGNOSIS — M25661 Stiffness of right knee, not elsewhere classified: Secondary | ICD-10-CM | POA: Diagnosis not present

## 2022-01-08 DIAGNOSIS — M25561 Pain in right knee: Secondary | ICD-10-CM | POA: Diagnosis not present

## 2022-01-08 DIAGNOSIS — M25661 Stiffness of right knee, not elsewhere classified: Secondary | ICD-10-CM | POA: Diagnosis not present

## 2022-01-11 DIAGNOSIS — M25561 Pain in right knee: Secondary | ICD-10-CM | POA: Diagnosis not present

## 2022-01-11 DIAGNOSIS — M25661 Stiffness of right knee, not elsewhere classified: Secondary | ICD-10-CM | POA: Diagnosis not present

## 2022-01-14 DIAGNOSIS — M25561 Pain in right knee: Secondary | ICD-10-CM | POA: Diagnosis not present

## 2022-01-14 DIAGNOSIS — M25661 Stiffness of right knee, not elsewhere classified: Secondary | ICD-10-CM | POA: Diagnosis not present

## 2022-01-18 DIAGNOSIS — M25661 Stiffness of right knee, not elsewhere classified: Secondary | ICD-10-CM | POA: Diagnosis not present

## 2022-01-18 DIAGNOSIS — M25561 Pain in right knee: Secondary | ICD-10-CM | POA: Diagnosis not present

## 2022-01-21 DIAGNOSIS — M25561 Pain in right knee: Secondary | ICD-10-CM | POA: Diagnosis not present

## 2022-01-21 DIAGNOSIS — M25661 Stiffness of right knee, not elsewhere classified: Secondary | ICD-10-CM | POA: Diagnosis not present

## 2022-01-25 DIAGNOSIS — M25661 Stiffness of right knee, not elsewhere classified: Secondary | ICD-10-CM | POA: Diagnosis not present

## 2022-01-25 DIAGNOSIS — M25561 Pain in right knee: Secondary | ICD-10-CM | POA: Diagnosis not present

## 2022-01-27 DIAGNOSIS — Z5189 Encounter for other specified aftercare: Secondary | ICD-10-CM | POA: Diagnosis not present

## 2022-01-28 DIAGNOSIS — M25561 Pain in right knee: Secondary | ICD-10-CM | POA: Diagnosis not present

## 2022-01-28 DIAGNOSIS — M25661 Stiffness of right knee, not elsewhere classified: Secondary | ICD-10-CM | POA: Diagnosis not present

## 2022-02-01 DIAGNOSIS — M25661 Stiffness of right knee, not elsewhere classified: Secondary | ICD-10-CM | POA: Diagnosis not present

## 2022-02-01 DIAGNOSIS — M25561 Pain in right knee: Secondary | ICD-10-CM | POA: Diagnosis not present

## 2022-02-04 DIAGNOSIS — M25661 Stiffness of right knee, not elsewhere classified: Secondary | ICD-10-CM | POA: Diagnosis not present

## 2022-02-04 DIAGNOSIS — M25561 Pain in right knee: Secondary | ICD-10-CM | POA: Diagnosis not present

## 2022-02-08 DIAGNOSIS — M25661 Stiffness of right knee, not elsewhere classified: Secondary | ICD-10-CM | POA: Diagnosis not present

## 2022-02-08 DIAGNOSIS — M25561 Pain in right knee: Secondary | ICD-10-CM | POA: Diagnosis not present

## 2022-03-04 DIAGNOSIS — G8929 Other chronic pain: Secondary | ICD-10-CM | POA: Diagnosis not present

## 2022-03-04 DIAGNOSIS — E669 Obesity, unspecified: Secondary | ICD-10-CM | POA: Diagnosis not present

## 2022-03-04 DIAGNOSIS — Z6837 Body mass index (BMI) 37.0-37.9, adult: Secondary | ICD-10-CM | POA: Diagnosis not present

## 2022-03-04 DIAGNOSIS — M7918 Myalgia, other site: Secondary | ICD-10-CM | POA: Diagnosis not present

## 2022-03-10 DIAGNOSIS — C641 Malignant neoplasm of right kidney, except renal pelvis: Secondary | ICD-10-CM | POA: Diagnosis not present

## 2022-03-10 DIAGNOSIS — C642 Malignant neoplasm of left kidney, except renal pelvis: Secondary | ICD-10-CM | POA: Diagnosis not present

## 2022-03-10 DIAGNOSIS — Z905 Acquired absence of kidney: Secondary | ICD-10-CM | POA: Diagnosis not present

## 2022-03-10 DIAGNOSIS — Z08 Encounter for follow-up examination after completed treatment for malignant neoplasm: Secondary | ICD-10-CM | POA: Diagnosis not present

## 2022-03-10 DIAGNOSIS — Z85528 Personal history of other malignant neoplasm of kidney: Secondary | ICD-10-CM | POA: Diagnosis not present

## 2022-04-06 ENCOUNTER — Encounter: Payer: Self-pay | Admitting: Family Medicine

## 2022-04-07 DIAGNOSIS — Z23 Encounter for immunization: Secondary | ICD-10-CM | POA: Diagnosis not present

## 2022-04-12 DIAGNOSIS — M545 Low back pain, unspecified: Secondary | ICD-10-CM | POA: Diagnosis not present

## 2022-04-27 ENCOUNTER — Telehealth: Payer: Self-pay | Admitting: Internal Medicine

## 2022-04-27 MED ORDER — AZITHROMYCIN 250 MG PO TABS: 250.0000 mg | ORAL_TABLET | Freq: Every day | ORAL | 0 refills | Status: DC

## 2022-04-27 MED ORDER — PREDNISONE 10 MG PO TABS: ORAL_TABLET | ORAL | 0 refills | Status: AC

## 2022-04-27 NOTE — Telephone Encounter (Signed)
Spoke with pt who has picked up meds and states understanding of medication instructions. Nothing further needed at this time.

## 2022-04-27 NOTE — Telephone Encounter (Signed)
Zpak 250 mg, # 6, 2 today then one daily  Prednisone 10 mg, # 20, 4 X 2 DAYS, 3 X 2 DAYS, 2 X 2 DAYS, 1 X 2 DAYS

## 2022-04-28 ENCOUNTER — Telehealth: Payer: Self-pay | Admitting: Internal Medicine

## 2022-04-29 NOTE — Telephone Encounter (Signed)
Called and spoke with pt who believes she needs to have a steroid injection as she is still having problems with wheezing even after recent meds prescribed. Stated to pt that we do not have any openings and that she would need to go to UC and she verbalized understanding. Nothing further needed.

## 2022-05-03 DIAGNOSIS — M545 Low back pain, unspecified: Secondary | ICD-10-CM | POA: Diagnosis not present

## 2022-05-06 ENCOUNTER — Encounter (HOSPITAL_BASED_OUTPATIENT_CLINIC_OR_DEPARTMENT_OTHER): Payer: Self-pay

## 2022-05-06 ENCOUNTER — Emergency Department (HOSPITAL_BASED_OUTPATIENT_CLINIC_OR_DEPARTMENT_OTHER): Payer: Medicare Other

## 2022-05-06 ENCOUNTER — Emergency Department (HOSPITAL_BASED_OUTPATIENT_CLINIC_OR_DEPARTMENT_OTHER)
Admission: EM | Admit: 2022-05-06 | Discharge: 2022-05-06 | Disposition: A | Discharge status: Home / Self Care | Payer: Medicare Other | Attending: Emergency Medicine | Admitting: Emergency Medicine

## 2022-05-06 DIAGNOSIS — S0990XA Unspecified injury of head, initial encounter: Secondary | ICD-10-CM | POA: Diagnosis not present

## 2022-05-06 DIAGNOSIS — Z7951 Long term (current) use of inhaled steroids: Secondary | ICD-10-CM | POA: Diagnosis not present

## 2022-05-06 DIAGNOSIS — W01198A Fall on same level from slipping, tripping and stumbling with subsequent striking against other object, initial encounter: Secondary | ICD-10-CM | POA: Insufficient documentation

## 2022-05-06 DIAGNOSIS — J45909 Unspecified asthma, uncomplicated: Secondary | ICD-10-CM | POA: Insufficient documentation

## 2022-05-06 DIAGNOSIS — Z7901 Long term (current) use of anticoagulants: Secondary | ICD-10-CM | POA: Insufficient documentation

## 2022-05-06 DIAGNOSIS — S0181XA Laceration without foreign body of other part of head, initial encounter: Secondary | ICD-10-CM

## 2022-05-06 DIAGNOSIS — S80212A Abrasion, left knee, initial encounter: Secondary | ICD-10-CM

## 2022-05-06 DIAGNOSIS — Z85528 Personal history of other malignant neoplasm of kidney: Secondary | ICD-10-CM | POA: Diagnosis not present

## 2022-05-06 DIAGNOSIS — S90512A Abrasion, left ankle, initial encounter: Secondary | ICD-10-CM

## 2022-05-06 DIAGNOSIS — Y92481 Parking lot as the place of occurrence of the external cause: Secondary | ICD-10-CM | POA: Insufficient documentation

## 2022-05-06 DIAGNOSIS — I10 Essential (primary) hypertension: Secondary | ICD-10-CM | POA: Diagnosis not present

## 2022-05-06 DIAGNOSIS — W19XXXA Unspecified fall, initial encounter: Secondary | ICD-10-CM

## 2022-05-06 DIAGNOSIS — S0081XA Abrasion of other part of head, initial encounter: Secondary | ICD-10-CM | POA: Diagnosis not present

## 2022-05-06 DIAGNOSIS — Z79899 Other long term (current) drug therapy: Secondary | ICD-10-CM | POA: Diagnosis not present

## 2022-05-06 NOTE — ED Triage Notes (Addendum)
States she was walking down the sidewalk and she tripped and fell onto head. Laceration to left forehead. States she believes she passed out, feels "woozy",abrasion to left ankle, states landed on right wrist, denies need for xray.  Not on thinners, hematoma to head

## 2022-05-06 NOTE — ED Provider Notes (Signed)
[facility] [place] Provider Note   CSN: [id number] Arrival date & time: 05/06/22  1329     History  Chief Complaint  Patient presents with   Fall   Head Injury    [name] is a 62 y.o. female.  Patient at approximately 1230 was in the [employer] parking lot when she stumbled and went down and hit her left forehead and an abrasion to her left knee and scrapes to her left ankle.  Patient able to ambulate fine.  Not concerned about bony injury to her knee or left ankle.  No loss of consciousness.  Patient not on blood thinners currently.  Patient's tetanus is up-to-date.  Past medical history significant for previous PE being on Eliquis.  But stopped Eliquis October 2023.  Past medical history significant for renal cell carcinoma history of left and right.  History of kidney stones hypertension asthma bronchitis.  Past surgical history significant for gallbladder removal nephrectomy partial left.         Home Medications Prior to Admission medications   Medication Sig Start Date End Date Taking? Authorizing Provider  ADVAIR DISKUS 100-50 MCG/ACT AEPB USE 1 INHALATION TWO TIMES A DAY THEN RINSE MOUTH 04/23/21   Baird Lyons D, MD  albuterol (PROVENTIL) (2.5 MG/3ML) 0.083% nebulizer solution Take 3 mLs (2.5 mg total) by nebulization every 6 (six) hours as needed for wheezing or shortness of breath. 12/02/21   Baird Lyons D, MD  albuterol (VENTOLIN HFA) 108 (90 Base) MCG/ACT inhaler Inhale 1-2 puffs into the lungs every 6 (six) hours as needed for shortness of breath. 09/04/21   Deneise Lever, MD  ALPRAZolam Duanne Moron) 0.5 MG tablet Take 0.5 mg by mouth 2 (two) times daily as needed for anxiety.    [provider]  apixaban (ELIQUIS) 2.5 MG TABS tablet Take 1 tablet (2.5 mg total) by mouth 2 (two) times daily for 20 days. 12/22/21 01/11/22  Jearld Lesch, PA  azithromycin (ZITHROMAX) 250 MG tablet 2 today then one daily Patient  not taking: Reported on 12/08/2021 12/02/21   Deneise Lever, MD  azithromycin (ZITHROMAX) 250 MG tablet Take 1 tablet (250 mg total) by mouth daily. Take 2 tablets the first day and then 1 tablet daily afterwards. 04/27/22   Baird Lyons D, MD  Cholecalciferol (VITAMIN D-3 PO) Take 1 tablet by mouth in the morning.    [provider]  gabapentin (NEURONTIN) 300 MG capsule Take 1 capsule (300 mg total) by mouth as directed. Take a 300 mg capsule three times a day for two weeks following surgery.Then take a 300 mg capsule two times a day for two weeks. Then take a 300 mg capsule once a day for two weeks. Then discontinue. 12/22/21   Jearld Lesch, PA  hydrochlorothiazide (HYDRODIURIL) 25 MG tablet Take 1 tablet (25 mg total) by mouth daily. 11/20/21   Kuneff, Renee A, DO  MAGNESIUM PO Take 1 tablet by mouth in the morning.    [provider]  methocarbamol (ROBAXIN) 500 MG tablet Take 1 tablet (500 mg total) by mouth every 6 (six) hours as needed for muscle spasms. 12/22/21   Jearld Lesch, PA  montelukast (SINGULAIR) 10 MG tablet Take 1 tablet (10 mg total) by mouth at bedtime. 10/13/21   Cobb, Karie Schwalbe, NP  OLANZapine-FLUoxetine (SYMBYAX) 3-25 MG capsule Take 1 capsule by mouth at bedtime.    [provider]  oxyCODONE (OXY IR/ROXICODONE) 5 MG immediate release tablet Take 1-2  tablets (5-10 mg total) by mouth every 6 (six) hours as needed for severe pain. 12/22/21   Jearld Lesch, PA  potassium chloride SA (KLOR-CON M) 20 MEQ tablet Take 1 tablet (20 mEq total) by mouth 2 (two) times daily. Patient taking differently: Take 40 mEq by mouth at bedtime. 11/20/21   Kuneff, Renee A, DO  PSYLLIUM HUSK PO Take 2 capsules by mouth in the morning and at bedtime. 10/12/20   [provider]  Red Yeast Rice Extract (RED YEAST RICE PO) Take 2 capsules by mouth in the morning. Bluebonnet CholesteRice Supplement (Red yeast rice/Plant Sterols/Pantethine/CoQ10/Policosanol)  6/38/75   [provider]      Allergies    Fosamax [alendronate]    Review of Systems   Review of Systems  Constitutional:  Negative for chills and fever.  HENT:  Negative for ear pain and sore throat.   Eyes:  Negative for pain and visual disturbance.  Respiratory:  Negative for cough and shortness of breath.   Cardiovascular:  Negative for chest pain and palpitations.  Gastrointestinal:  Negative for abdominal pain and vomiting.  Genitourinary:  Negative for dysuria and hematuria.  Musculoskeletal:  Negative for arthralgias and back pain.  Skin:  Positive for wound. Negative for color change and rash.  Neurological:  Negative for seizures and syncope.  All other systems reviewed and are negative.   Physical Exam Updated Vital Signs BP (!) 144/92   Pulse 71   Temp 98.6 F (37 C) (Oral)   Resp 16   Ht 1.549 m ('5\' 1"'$ )   Wt 86.2 kg   LMP 04/03/2013   SpO2 99%   BMI 35.90 kg/m  Physical Exam Vitals and nursing note reviewed.  Constitutional:      General: She is not in acute distress.    Appearance: Normal appearance. She is well-developed.  HENT:     Head: Normocephalic.     Comments: Forehead with about 2 cm stellate laceration all intact not gaping.  No further bleeding.    Mouth/Throat:     Mouth: Mucous membranes are moist.  Eyes:     Extraocular Movements: Extraocular movements intact.     Conjunctiva/sclera: Conjunctivae normal.     Pupils: Pupils are equal, round, and reactive to light.     Comments: Extra muscles intact.  No hyphema.  Vision intact.  Cardiovascular:     Rate and Rhythm: Normal rate and regular rhythm.     Heart sounds: No murmur heard. Pulmonary:     Effort: Pulmonary effort is normal. No respiratory distress.     Breath sounds: Normal breath sounds.  Abdominal:     Palpations: Abdomen is soft.     Tenderness: There is no abdominal tenderness.  Musculoskeletal:        General: Signs of injury present. No swelling.      Cervical back: Neck supple.     Comments: Abrasion to the left knee measuring about 5 x 3 cm.  No active bleeding.  Good range of motion of the knee.  No significant swelling.  Also scratch like abrasion to the anterior medial aspect of the left ankle.  Neurovascular intact distally.  No deep wounds.  Good movement of the foot and ankle.  Patient does have some chronic enlargement of the left ankle due to a previous orthopedic injury.  No significant tenderness.  Skin:    General: Skin is warm and dry.     Capillary Refill: Capillary refill takes less than 2 seconds.  Neurological:     General: No focal deficit present.     Mental Status: She is alert and oriented to person, place, and time.     Cranial Nerves: No cranial nerve deficit.     Sensory: No sensory deficit.     Motor: No weakness.  Psychiatric:        Mood and Affect: Mood normal.     ED Results / Procedures / Treatments   Labs (all labs ordered are listed, but only abnormal results are displayed) Labs Reviewed - No data to display  EKG None  Radiology CT Head Wo Contrast  Result Date: 05/06/2022 CLINICAL DATA:  Head trauma, minor, normal mental status (Age 1-64y) EXAM: CT HEAD WITHOUT CONTRAST TECHNIQUE: Contiguous axial images were obtained from the base of the skull through the vertex without intravenous contrast. RADIATION DOSE REDUCTION: This exam was performed according to the departmental dose-optimization program which includes automated exposure control, adjustment of the mA and/or kV according to patient size and/or use of iterative reconstruction technique. COMPARISON:  None Available. FINDINGS: Brain: No evidence of acute infarction, hemorrhage, hydrocephalus, extra-axial collection or mass lesion/mass effect. Vascular: No hyperdense vessel identified. Skull: No acute fracture. Sinuses/Orbits: Visualized sinuses are clear. No acute orbital findings. Other: No mastoid effusions. IMPRESSION: No evidence of acute  intracranial abnormality. Electronically Signed   By: Margaretha Sheffield M.D.   On: 05/06/2022 14:29    Procedures Procedures    Medications Ordered in ED Medications - No data to display  ED Course/ Medical Decision Making/ A&P                             Medical Decision Making Amount and/or Complexity of Data Reviewed Radiology: ordered.   The laceration to forehead is intact.  Does not need gluing does not need suturing.  Will apply antibiotic ointment and Band-Aid.  Wound abrasion to the left knee and left ankle without evidence of any significant bony injury.  Wound care.  Patient's tetanus is up-to-date.  CT head without any acute findings.   Final Clinical Impression(s) / ED Diagnoses Final diagnoses:  Fall, initial encounter  Injury of head, initial encounter  Laceration of forehead, initial encounter  Abrasion of left knee, initial encounter  Abrasion of left ankle, initial encounter    Rx / DC Orders ED Discharge Orders     None         Fredia Sorrow, MD 05/06/22 1859

## 2022-05-06 NOTE — Discharge Instructions (Signed)
For the wounds wash daily with soap and water.  Apply antibiotic ointment like Polysporin or Neosporin and then a Band-Aid to the forehead wound.  No dressing required for the left knee abrasion and the ankle abrasion.  Follow-up with your doctor as needed.  Return for any new or worse symptoms.  CT head without any acute findings.

## 2022-05-07 ENCOUNTER — Ambulatory Visit (INDEPENDENT_AMBULATORY_CARE_PROVIDER_SITE_OTHER): Payer: Medicare Other | Admitting: Family Medicine

## 2022-05-07 ENCOUNTER — Encounter: Payer: Self-pay | Admitting: Family Medicine

## 2022-05-07 VITALS — BP 110/76 | HR 82 | Temp 98.8°F | Wt 197.8 lb

## 2022-05-07 DIAGNOSIS — F32A Depression, unspecified: Secondary | ICD-10-CM | POA: Diagnosis not present

## 2022-05-07 DIAGNOSIS — R918 Other nonspecific abnormal finding of lung field: Secondary | ICD-10-CM | POA: Diagnosis not present

## 2022-05-07 DIAGNOSIS — Z85528 Personal history of other malignant neoplasm of kidney: Secondary | ICD-10-CM | POA: Diagnosis not present

## 2022-05-07 DIAGNOSIS — M85851 Other specified disorders of bone density and structure, right thigh: Secondary | ICD-10-CM | POA: Diagnosis not present

## 2022-05-07 DIAGNOSIS — I10 Essential (primary) hypertension: Secondary | ICD-10-CM

## 2022-05-07 DIAGNOSIS — E782 Mixed hyperlipidemia: Secondary | ICD-10-CM

## 2022-05-07 DIAGNOSIS — R7309 Other abnormal glucose: Secondary | ICD-10-CM | POA: Diagnosis not present

## 2022-05-07 DIAGNOSIS — F419 Anxiety disorder, unspecified: Secondary | ICD-10-CM | POA: Diagnosis not present

## 2022-05-07 DIAGNOSIS — E559 Vitamin D deficiency, unspecified: Secondary | ICD-10-CM | POA: Diagnosis not present

## 2022-05-07 LAB — TSH: TSH: 2.18 u[IU]/mL (ref 0.35–5.50)

## 2022-05-07 LAB — LIPID PANEL
Cholesterol: 210 mg/dL — ABNORMAL HIGH (ref 0–200)
HDL: 57.1 mg/dL (ref >39.00)
LDL Cholesterol: 125 mg/dL — ABNORMAL HIGH (ref 0–99)
NonHDL: 152.43
Total CHOL/HDL Ratio: 4
Triglycerides: 136 mg/dL (ref 0.0–149.0)
VLDL: 27.2 mg/dL (ref 0.0–40.0)

## 2022-05-07 LAB — VITAMIN D 25 HYDROXY (VIT D DEFICIENCY, FRACTURES): VITD: 32 ng/mL (ref 30.00–100.00)

## 2022-05-07 LAB — HEMOGLOBIN A1C: Hgb A1c MFr Bld: 6.3 % (ref 4.6–6.5)

## 2022-05-07 MED ORDER — HYDROCHLOROTHIAZIDE 25 MG PO TABS: 25.0000 mg | ORAL_TABLET | Freq: Every day | ORAL | 1 refills | Status: DC

## 2022-05-07 MED ORDER — POTASSIUM CHLORIDE CRYS ER 20 MEQ PO TBCR: 40.0000 meq | EXTENDED_RELEASE_TABLET | Freq: Every day | ORAL | 1 refills | Status: DC

## 2022-05-07 NOTE — Progress Notes (Signed)
Patient ID: [name], female  DOB: 04-04-1961, 62 y.o.   MRN: [id number] Patient Care Team    Relationship Specialty Notifications Start End  Ma Hillock, DO PCP - General Family Medicine  03/05/15   Gwendel Hanson, MD Referring Physician Urology  04/08/17   Aviva Signs, MD Referring Physician Gastroenterology  04/08/17   Deneise Lever, MD Consulting Physician Pulmonary Disease  04/08/17   Brien Few, MD Consulting Physician Obstetrics and Gynecology  06/05/21     Chief Complaint  Patient presents with   Hypertension    Subjective: [name] is a 62 y.o.  Female  present for chronic condition management Hypertension/hyperlipidemia/morbid obesity- bmi >30/hypok: Pt reports compliance with HCTZ 25 mg QD Patient denies chest pain, shortness of breath, dizziness or lower extremity edema.  Pt does not take a  daily baby ASA. Pt is not prescribed statin. She did start red yeast rice and psyllium supplements. Diet: Cutting out soda, sugar Exercise: routinely.  RF: HTN, HLD, obesity, FHx HD   H/O RCC:  Pt has a h/o RCC left kidney with partial nephrectomy and RCC right kidney with cryoablation. She is following with specialties routinely every 6 months for now.      11/20/2021   10:53 AM 11/14/2021   10:10 AM 11/12/2020    9:18 AM 06/27/2020    1:10 PM 06/27/2019   10:23 AM  Depression screen PHQ 2/9  Decreased Interest 0 0 0 0 0  Down, Depressed, Hopeless 0 0 0 0 0  PHQ - 2 Score 0 0 0 0 0  Altered sleeping 0   0 0  Tired, decreased energy 0   0 0  Change in appetite 0   0 0  Feeling bad or failure about yourself  0   0 0  Trouble concentrating 0   0 0  Moving slowly or fidgety/restless 0   0 0  Suicidal thoughts 0   0 0  PHQ-9 Score 0   0 0  Difficult doing work/chores     Not difficult at all      11/20/2021   10:53 AM 06/27/2019   10:23 AM 12/15/2018    9:53 AM 06/13/2018   10:45 AM  GAD 7 : Generalized Anxiety Score  Nervous,  Anxious, on Edge 1 0 0 2  Control/stop worrying 1 0 0 1  Worry too much - different things 1 0 0 1  Trouble relaxing 0 0 0 0  Restless 0 0 0 0  Easily annoyed or irritable 0 0 0 2  Afraid - awful might happen 0 0 0 1  Total GAD 7 Score 3 0 0 7  Anxiety Difficulty  Not difficult at all Not difficult at all Not difficult at all    Immunization History  Administered Date(s) Administered   Influenza Split 01/24/2012, 01/06/2013, 01/03/2014, 12/16/2014, 01/15/2015   Influenza,inj,Quad PF,6+ Mos 12/16/2014, 01/06/2016, 11/25/2016, 12/15/2017, 12/15/2018, 01/07/2020, 12/15/2020, 11/20/2021   Influenza-Unspecified 12/16/2014, 01/06/2016, 11/25/2016, 12/15/2017, 12/15/2018, 01/07/2020   PFIZER(Purple Top)SARS-COV-2 Vaccination 06/21/2019, 07/12/2019, 01/15/2020, 08/26/2020, 12/30/2020   Pneumococcal Conjugate-13 03/24/2015   Pneumococcal Polysaccharide-23 12/17/2013   Td 09/03/2020   Tdap 03/24/2015     Past Medical History:  Diagnosis Date   Anxiety    psychiatrist: Dr. Robina Ade    Arthritis    Asthma    Bronchitis    Colitis    Colon polyps    COVID-19 09/2020   Depression    psychiatrist: Dr.  Carr   Elevated liver enzymes    Dr. Rulon Abide following   Fatty liver    GERD (gastroesophageal reflux disease)    Heart murmur    History of chicken pox    History of kidney stones    Hypertension    Multiple pulmonary nodules determined by computed tomography of lung 03/07/2014   Last CT suggested clearance of tree-in-bud.  Follows with Dr. Tarri Fuller     Chest CT [facility] 2015-micronodules, groundglass, tree in bud right upper lobe  CT chest 03/15/16 No evidence of interstitial lung disease or mycobacterium aviumcomplex. Scattered pulmonary nodules. No f/u needed- low risk   Osteoarthritis of right knee 07/29/2021   Postmenopausal bleeding 06/27/2020   Primary osteoarthritis of both knees 04/09/2011   Formatting of this note might be different from the original. Overview:  S/p left total knee  replacement   Renal cell carcinoma    hx of left and right; Dr. Nena Alexander   Trochanteric bursitis, left hip 09/27/2018   Allergies  Allergen Reactions   Fosamax [Alendronate] Other (See Comments)    Severe GERD   Past Surgical History:  Procedure Laterality Date   CHOLECYSTECTOMY     FETAL RADIO FREQUENCY ABLATION     FRACTURE SURGERY Left    ankle   JOINT REPLACEMENT Left 2009   knee   NEPHRECTOMY     partial LT   OPEN SURGICAL REPAIR OF GLUTEAL TENDON Left 09/27/2018   Procedure: Left hip bursectomy; gluteal tendon repair;  Surgeon: Gaynelle Arabian, MD;  Location: [facility];  Service: Orthopedics;  Laterality: Left;  2mn   TONSILLECTOMY     TOTAL KNEE ARTHROPLASTY Right 12/21/2021   Procedure: TOTAL KNEE ARTHROPLASTY;  Surgeon: AGaynelle Arabian MD;  Location: [facility];  Service: Orthopedics;  Laterality: Right;   Family History  Problem Relation Age of Onset   Hypertension Mother    Alzheimer's disease Mother    Arthritis Mother    Hypertension Father    Alzheimer's disease Father    COPD Father    Heart disease Father    Arthritis Father    Hypertension Sister    Social History   Social History Narrative   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   Lives with her husband, son and granddaughter.   Drinks caffeinated beverages.   Wears her seatbelt, wears a bicycle helmet, there is a smoke detector in home. There are no firearms in the home.   Patient feels safe in her relationships.    Allergies as of 05/07/2022       Reactions   Fosamax [alendronate] Other (See Comments)   Severe GERD        Medication List        Accurate as of May 07, 2022  8:43 AM. If you have any questions, ask your nurse or doctor.          STOP taking these medications    alendronate 35 MG tablet Commonly known as: FOSAMAX Stopped by: RHoward Pouch DO   apixaban 2.5 MG Tabs tablet Commonly known as: ELIQUIS Stopped by: RHoward Pouch  DO   azithromycin 250 MG tablet Commonly known as: ZITHROMAX Stopped by: RHoward Pouch DO   gabapentin 300 MG capsule Commonly known as: NEURONTIN Stopped by: RHoward Pouch DO   methocarbamol 500 MG tablet Commonly known as: ROBAXIN Stopped by: RHoward Pouch DO   oxyCODONE 5 MG immediate release tablet Commonly known as: Oxy IR/ROXICODONE Stopped by:  Howard Pouch, DO       TAKE these medications    albuterol 108 (90 Base) MCG/ACT inhaler Commonly known as: VENTOLIN HFA Inhale 1-2 puffs into the lungs every 6 (six) hours as needed for shortness of breath.   albuterol (2.5 MG/3ML) 0.083% nebulizer solution Commonly known as: PROVENTIL Take 3 mLs (2.5 mg total) by nebulization every 6 (six) hours as needed for wheezing or shortness of breath.   ALPRAZolam 0.5 MG tablet Commonly known as: XANAX Take 0.5 mg by mouth 2 (two) times daily as needed for anxiety.   amphetamine-dextroamphetamine 15 MG tablet Commonly known as: ADDERALL   hydrochlorothiazide 25 MG tablet Commonly known as: HYDRODIURIL Take 1 tablet (25 mg total) by mouth daily.   MAGNESIUM PO Take 1 tablet by mouth in the morning.   montelukast 10 MG tablet Commonly known as: SINGULAIR Take 1 tablet (10 mg total) by mouth at bedtime.   OLANZapine-FLUoxetine 3-25 MG capsule Commonly known as: SYMBYAX Take 1 capsule by mouth at bedtime.   potassium chloride SA 20 MEQ tablet Commonly known as: KLOR-CON M Take 2 tablets (40 mEq total) by mouth at bedtime.   PSYLLIUM HUSK PO Take 2 capsules by mouth in the morning and at bedtime.   RED YEAST RICE PO Take 2 capsules by mouth in the morning. Blue[place]net CholesteRice Supplement (Red yeast rice/Plant Sterols/Pantethine/CoQ10/Policosanol)   VITAMIN D-3 PO Take 1 tablet by mouth in the morning.   Wixela Inhub 100-50 MCG/ACT Aepb Generic drug: fluticasone-salmeterol What changed: Another medication with the same name was removed. Continue taking this  medication, and follow the directions you see here. Changed by: Howard Pouch, DO        All past medical history, surgical history, allergies, family history, immunizations andmedications were updated in the EMR today and reviewed under the history and medication portions of their EMR.      ROS: 14 pt review of systems performed and negative (unless mentioned in an HPI)  Objective: BP 110/76   Pulse 82   Temp 98.8 F (37.1 C)   Wt 197 lb 12.8 oz (89.7 kg)   LMP 04/03/2013   SpO2 95%   BMI 37.37 kg/m  Physical Exam Vitals and nursing note reviewed.  Constitutional:      General: She is not in acute distress.    Appearance: Normal appearance. She is not ill-appearing, toxic-appearing or diaphoretic.  HENT:     Head: Normocephalic and atraumatic.  Eyes:     General: No scleral icterus.       Right eye: No discharge.        Left eye: No discharge.     Extraocular Movements: Extraocular movements intact.     Conjunctiva/sclera: Conjunctivae normal.     Pupils: Pupils are equal, round, and reactive to light.  Cardiovascular:     Rate and Rhythm: Normal rate and regular rhythm.  Pulmonary:     Effort: Pulmonary effort is normal. No respiratory distress.     Breath sounds: Normal breath sounds. No wheezing, rhonchi or rales.  Musculoskeletal:     Right lower leg: No edema.     Left lower leg: No edema.  Skin:    General: Skin is warm.     Findings: No rash.  Neurological:     Mental Status: She is alert and oriented to person, place, and time. Mental status is at baseline.     Motor: No weakness.     Gait: Gait normal.  Psychiatric:  Mood and Affect: Mood normal.        Behavior: Behavior normal.        Thought Content: Thought content normal.        Judgment: Judgment normal.     No results found.  Assessment/plan: [name] is a 63 y.o. female present for condition management Anxiety and depression Follows w/ psych  Essential hypertension,  benign/Hyperlipidemia, unspecified hyperlipidemia type/Morbid obesity (HCC)/hypokalemia Stable Continue HCTZ 25 mg -Continue Kdur 40 mill equivalents daily - Goal LDL 100-130.  She will think about statin use if not at goal.  We also discussed Zetia as a potential.  Lengthy discussion today surrounding her concern of statins and her renal function. -For now continue red yeast rice and psyllium supplements.   Cbc and cmp UTD Tsh, lipids collected today  Elevated A1c: A1c collected today  Vitamin D deficiency/osteopneia - vit d - collected today -DEXA UTD  01/02/2021 - supplementing vit d Pt dcd fosamax 35 weekly> severe GERD   Weight loss counseling: Pt was encouraged to make an appt to discuss and provided the follow information.  If you are interested in weight loss counseling please make appt to discuss and bring with you 2 weeks of a food diary/log on notebook paper.  Food log is mandatory on first appt to proceed with counseling.  Weight loss counseling encompasses diet, exercise and can include  medications when appropriate and affordable.  There are routine appts for check-ins and weights to track progress and keep you on track.  Routine check-ins (in person) are also mandatory to continue with prescription refills. Check-in timeline  can range from 4 weeks to 12 weeks, depending on physician's recommendations and which step you are in of your weight loss journey.  Your BMI today is Body mass index is 37.37 kg/m.  Please check with your insurance prior to appt and ask them if they cover weight loss medications for your BMI? And if so, which medications. They may tell you some of the diabetes meds that are used for weight loss also,  are on your formulary- but this does not mean they are covered for weight loss only.  Even if they tell with a prior auth it is covered- make sure they check to see if you personally meet criteria with your BMI.   Return in about 24 weeks (around  10/22/2022) for Routine chronic condition follow-up.   Orders Placed This Encounter  Procedures   TSH   Lipid panel   Hemoglobin A1c   Vitamin D (25 hydroxy)    Meds ordered this encounter  Medications   hydrochlorothiazide (HYDRODIURIL) 25 MG tablet    Sig: Take 1 tablet (25 mg total) by mouth daily.    Dispense:  90 tablet    Refill:  1   potassium chloride SA (KLOR-CON M) 20 MEQ tablet    Sig: Take 2 tablets (40 mEq total) by mouth at bedtime.    Dispense:  180 tablet    Refill:  1    Referral Orders  No referral(s) requested today     Electronically signed by: Howard Pouch, [place]

## 2022-05-07 NOTE — Patient Instructions (Signed)
No follow-ups on file.        Great to see you today.  I have refilled the medication(s) we provide.   If labs were collected, we will inform you of lab results once received either by echart message or telephone call.   - echart message- for normal results that have been seen by the patient already.   - telephone call: abnormal results or if patient has not viewed results in their echart.   If you are interested in weight loss counseling please make appt to discuss and bring with you 2 weeks of a food diary/log on notebook paper.  Food log is mandatory on first appt to proceed with counseling.  Weight loss counseling encompasses diet, exercise and can include  medications when appropriate and affordable.  There are routine appts for check-ins and weights to track progress and keep you on track.  Routine check-ins (in person) are also mandatory to continue with prescription refills. Check-in timeline  can range from 4 weeks to 12 weeks, depending on physician's recommendations and which step you are in of your weight loss journey.  Your BMI today is Body mass index is 37.37 kg/m.  Please check with your insurance prior to appt and ask them if they cover weight loss medications for your BMI? And if so, which medications. They may tell you some of the diabetes meds that are used for weight loss also,  are on your formulary- but this does not mean they are covered for weight loss only.  Even if they tell with a prior auth it is covered- make sure they check to see if you personally meet criteria with your BMI.

## 2022-05-10 NOTE — Progress Notes (Unsigned)
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 MAIC, GERD, complicated by history renal cell CA PFT- 2008-  CT chest [facility] 12/13/13- Ground glass, tree-in-bud, and micronodular opacities in a bronchovascular pattern involving the right upper lobe are favored to be related to an infectious process. Recommend repeat chest CT after appropriate therapy to document resolution. CT chest 03/15/16 No evidence of interstitial lung disease or mycobacterium aviumcomplex. Scattered pulmonary nodules. 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 MAIC, GERD/ LPR, complicated by history renal cell CA, HTN,  -Advair 100, Ventolin hfa, neb albuterol,  Covid vax- 3 Phizer Flu vax- had CXR [facility] 03/09/21-unchanged cardiomediastinal silhouette.  Persistent mild elevation right hemidiaphragm with associated right basilar atelectasis.  No significant pleural effusion.  No pneumothorax.  Ill-defined lucency appears similar to prior, of the lower thoracic vertebrae, favored radiographic overlap of structures.  No reported changes compared with CXR 03/05/2020. Preop for TKR/ Dr Wynelle Link -----Patient is here for surgical clearance, feels like her breathing is good. Had CXR at [facility] and wants to discuss results She denies cough wheeze or other respiratory symptoms and feels well now.  She had had COVID infection twice in June and October 2022 with no specific treatment.  We discussed possible use of an antiviral if she were to get COVID infection again.  She has no recognized residual from her first infections. No apparent infection or acute problem or instability that would represent increased pulmonary risk associated with anticipated knee replacement surgery. She is clear from a  pulmonary standpoint to proceed with surgical plans.  Consultation will be available if needed.  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 MAIC, GERD/ LPR, complicated by history renal cell CA, HTN, hx Covid infection 2022,  -Wixela, Proair hfa, neb albuterol,  Covid vax- 5 Phizer Flu vax- had -----Pt states she fell in a parking lot last week and now has pain under left breast also has a lingering cough she thinks she had bronchitis over a month ago Recent bronchitis for which we sent Z-Pak and prednisone taper January 23.  The prednisone made her "hyper".  Residual cough has been slow to clear but mostly nonproductive now. 1 week ago she slipped on gravel and fell, striking left anterior chest wall.  She describes brief LOC and sustained left periorbital bruising, pleuritic pain under left breast, and abrasion on left knee and left ankle.  She went to urgent care.  Still having pleuritic pain left anterior chest wall, focal to fingertip pressure.  Her residual cough aggravates this. Insurance changed Advair to [employer] and Ventolin to ProAir-discussed. CXR 10/13/21- IMPRESSION: Normal exam.  ROS-see HPI   + = positive Constitutional:   No-   weight loss, night sweats, fevers, chills, fatigue, lassitude. HEENT:   +headaches, difficulty swallowing, tooth/dental problems, sore throat,       No-  sneezing, itching,  ear ache, nasal congestion, post nasal drip,  CV:  + chest pain, orthopnea, PND, swelling in lower extremities, anasarca, dizziness, palpitations Resp: +shortness of breath with exertion or at rest.             No-productive cough,   non-productive cough,  No- coughing up of blood.              -change in color of mucus. No- wheezing.   Skin: No-  rash or lesions. GI:  + heartburn, indigestion, No-abdominal pain, nausea, vomiting,  GU: . MS:  +joint pain or swelling.   Neuro-     nothing unusual Psych:   No- change in mood or affect. No depression or anxiety.  No memory loss.  OBJ- Physical Exam General- Alert, Oriented, Affect-appropriate, Distress- none acute. + Overweight. Skin- rash-none, lesions- none, excoriation- none  +L periorbital bruising,  Lymphadenopathy- none Head- atraumatic            Eyes- Gross vision intact, PERRLA, conjunctivae and secretions clear            Ears- clear            Nose- no- turbinate edema, no-Septal dev, mucus, polyps, erosion, perforation,            Throat- Mallampati II , mucosa-red , drainage+, tonsils- atrophic,   Neck- flexible , trachea midline, no stridor , thyroid nl, carotid no bruit Chest - symmetrical excursion , unlabored           Heart/CV- RRR , no murmur , no gallop  , no rub, nl s1 s2                           - JVD- none , edema- none, stasis changes- none, varices- none           Lung- +clear unlabored, wheeze- none, cough- none, dullness-none, rub- none           Chest wall- +point tender under L breast Abd-  Br/ Gen/ Rectal- Not done, not indicated Extrem- +scab L knee and L ankle Neuro- grossly intact to observation

## 2022-05-13 ENCOUNTER — Ambulatory Visit (INDEPENDENT_AMBULATORY_CARE_PROVIDER_SITE_OTHER): Payer: Medicare Other

## 2022-05-13 ENCOUNTER — Ambulatory Visit (INDEPENDENT_AMBULATORY_CARE_PROVIDER_SITE_OTHER): Payer: Medicare Other | Admitting: Internal Medicine

## 2022-05-13 ENCOUNTER — Encounter: Payer: Self-pay | Admitting: Internal Medicine

## 2022-05-13 VITALS — BP 112/72 | HR 86 | Ht 61.0 in | Wt 200.6 lb

## 2022-05-13 DIAGNOSIS — J209 Acute bronchitis, unspecified: Secondary | ICD-10-CM | POA: Diagnosis not present

## 2022-05-13 DIAGNOSIS — R0789 Other chest pain: Secondary | ICD-10-CM | POA: Diagnosis not present

## 2022-05-13 NOTE — Assessment & Plan Note (Signed)
Recurrent acute bronchitis.  This episode seems largely resolved with minor residual cough.  She has been using Delsym.  She has tramadol at home and I explained how she could use that for cough if needed.  Marland Kitchen

## 2022-05-13 NOTE — Assessment & Plan Note (Signed)
Related to a fall on 05/06/2022.  Exam consistent with musculoskeletal chest wall pain. Plan-CXR, heat, tramadol or Advil

## 2022-05-13 NOTE — Patient Instructions (Signed)
Order- CXR- Dx fell- left chest wall pain  For the cough- try your Tramadol- 50 or 100 mg every 8 hours as needed                                       Delsym                                       Try otc Zero Sugar Marolyn Hammock Rancher hard candies  ([employer], [employer])  Tramadol or Advil and a heating pad may also help the chest wall pain

## 2022-05-14 ENCOUNTER — Encounter: Payer: Self-pay | Admitting: Internal Medicine

## 2022-05-21 ENCOUNTER — Encounter: Payer: Self-pay | Admitting: Family Medicine

## 2022-05-21 ENCOUNTER — Ambulatory Visit (INDEPENDENT_AMBULATORY_CARE_PROVIDER_SITE_OTHER): Payer: Medicare Other | Admitting: Family Medicine

## 2022-05-21 DIAGNOSIS — R7309 Other abnormal glucose: Secondary | ICD-10-CM

## 2022-05-21 DIAGNOSIS — Z713 Dietary counseling and surveillance: Secondary | ICD-10-CM | POA: Diagnosis not present

## 2022-05-21 MED ORDER — WEGOVY 0.25 MG/0.5ML ~~LOC~~ SOAJ: 0.2500 mg | SUBCUTANEOUS | 0 refills | Status: DC

## 2022-05-21 NOTE — Patient Instructions (Addendum)
counseled on exercise, calorie counting, weight loss and potential medications to help with weight loss today. online resources for: Weekly net calorie calculator.  Applications for calorie counting.   Patient was advised to ensure she is taking in adequate nutrition daily by meeting calorie goals. glycemic index. exercise goal of 150 minutes a week (plus warm up and cool down) of cardiovascular exercise.  -Patient was encouraged to maintain adequate water consumption of at least 80 ounces a day, more if exercising/sweating.  Next appt in 4 weeks

## 2022-05-21 NOTE — Progress Notes (Signed)
[name] , 14-Jun-1960, 62 y.o., female MRN: [id number] Patient Care Team    Relationship Specialty Notifications Start End  Ma Hillock, DO PCP - General Family Medicine  03/05/15   Gwendel Hanson, MD Referring Physician Urology  04/08/17   Aviva Signs, MD Referring Physician Gastroenterology  04/08/17   Deneise Lever, MD Consulting Physician Pulmonary Disease  04/08/17   Brien Few, MD Consulting Physician Obstetrics and Gynecology  06/05/21     Chief Complaint  Patient presents with   Obesity     Subjective: [name] is a 62 y.o. Pt presents for an OV to discuss obesity. Weight (lbs): 201 BMI 38.05 Patient brings with her food diary of 2 weeks which was reviewed with her today.  Patient currently does not perform routine exercise, but is getting ready to join [employer].  She does not follow a routine diet.  She reports she was able to lose weight a few years ago by watching her diet and exercising.  But when she reached her goal, which was for her son the leading, she has backtracks since that time.  Patient has an elevated A1c of 6.3, hypertension, hyperlipidemia and morbid obesity.    05/21/2022    8:32 AM 11/20/2021   10:53 AM 11/14/2021   10:10 AM 11/12/2020    9:18 AM 06/27/2020    1:10 PM  Depression screen PHQ 2/9  Decreased Interest 0 0 0 0 0  Down, Depressed, Hopeless 0 0 0 0 0  PHQ - 2 Score 0 0 0 0 0  Altered sleeping 1 0   0  Tired, decreased energy 1 0   0  Change in appetite 3 0   0  Feeling bad or failure about yourself  0 0   0  Trouble concentrating 0 0   0  Moving slowly or fidgety/restless 0 0   0  Suicidal thoughts 0 0   0  PHQ-9 Score 5 0   0  Difficult doing work/chores Somewhat difficult        Allergies  Allergen Reactions   Fosamax [Alendronate] Other (See Comments)    Severe GERD   Social History   Social History Narrative   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   Lives with her husband, son and granddaughter.   Drinks caffeinated beverages.   Wears her seatbelt, wears a bicycle helmet, there is a smoke detector in home. There are no firearms in the home.   Patient feels safe in her relationships.   Past Medical History:  Diagnosis Date   Anxiety    psychiatrist: Dr. Robina Ade    Arthritis    Asthma    Bronchitis    Colitis    Colon polyps    COVID-19 09/2020   Depression    psychiatrist: Dr. Robina Ade   Elevated liver enzymes    Dr. Rulon Abide following   Fatty liver    GERD (gastroesophageal reflux disease)    Heart murmur    History of chicken pox    History of kidney stones    Hypertension    Multiple pulmonary nodules determined by computed tomography of lung 03/07/2014   Last CT suggested clearance of tree-in-bud.  Follows with Dr. Tarri Fuller     Chest CT [facility] 2015-micronodules, groundglass, tree in bud right upper lobe  CT chest 03/15/16 No evidence of interstitial lung disease or mycobacterium aviumcomplex. Scattered  pulmonary nodules. No f/u needed- low risk   Osteoarthritis of right knee 07/29/2021   Postmenopausal bleeding 06/27/2020   Primary osteoarthritis of both knees 04/09/2011   Formatting of this note might be different from the original. Overview:  S/p left total knee replacement   Renal cell carcinoma    hx of left and right; Dr. Nena Alexander   Trochanteric bursitis, left hip 09/27/2018   Past Surgical History:  Procedure Laterality Date   CHOLECYSTECTOMY     FETAL RADIO FREQUENCY ABLATION     FRACTURE SURGERY Left    ankle   JOINT REPLACEMENT Left 2009   knee   NEPHRECTOMY     partial LT   OPEN SURGICAL REPAIR OF GLUTEAL TENDON Left 09/27/2018   Procedure: Left hip bursectomy; gluteal tendon repair;  Surgeon: Gaynelle Arabian, MD;  Location: [facility];  Service: Orthopedics;  Laterality: Left;  79mn   TONSILLECTOMY     TOTAL KNEE ARTHROPLASTY Right 12/21/2021   Procedure: TOTAL KNEE ARTHROPLASTY;  Surgeon:  AGaynelle Arabian MD;  Location: [facility];  Service: Orthopedics;  Laterality: Right;   Family History  Problem Relation Age of Onset   Hypertension Mother    Alzheimer's disease Mother    Arthritis Mother    Hypertension Father    Alzheimer's disease Father    COPD Father    Heart disease Father    Arthritis Father    Hypertension Sister    Allergies as of 05/21/2022       Reactions   Fosamax [alendronate] Other (See Comments)   Severe GERD        Medication List        Accurate as of May 21, 2022  1:02 PM. If you have any questions, ask your nurse or doctor.          albuterol 108 (90 Base) MCG/ACT inhaler Commonly known as: VENTOLIN HFA Inhale 1-2 puffs into the lungs every 6 (six) hours as needed for shortness of breath.   albuterol (2.5 MG/3ML) 0.083% nebulizer solution Commonly known as: PROVENTIL Take 3 mLs (2.5 mg total) by nebulization every 6 (six) hours as needed for wheezing or shortness of breath.   ALPRAZolam 0.5 MG tablet Commonly known as: XANAX Take 0.5 mg by mouth 2 (two) times daily as needed for anxiety.   hydrochlorothiazide 25 MG tablet Commonly known as: HYDRODIURIL Take 1 tablet (25 mg total) by mouth daily.   MAGNESIUM PO Take 1 tablet by mouth in the morning.   montelukast 10 MG tablet Commonly known as: SINGULAIR Take 1 tablet (10 mg total) by mouth at bedtime.   OLANZapine-FLUoxetine 3-25 MG capsule Commonly known as: SYMBYAX Take 1 capsule by mouth at bedtime.   potassium chloride SA 20 MEQ tablet Commonly known as: KLOR-CON M Take 2 tablets (40 mEq total) by mouth at bedtime.   PSYLLIUM HUSK PO Take 2 capsules by mouth in the morning and at bedtime.   RED YEAST RICE PO Take 2 capsules by mouth in the morning. Blue[place]net CholesteRice Supplement (Red yeast rice/Plant Sterols/Pantethine/CoQ10/Policosanol)   VITAMIN D-3 PO Take 1 tablet by mouth in the morning.   Wegovy 0.25 MG/0.5ML Soaj Generic drug:  Semaglutide-Weight Management Inject 0.25 mg into the skin once a week. Started by: RHoward Pouch DO   Wixela Inhub 100-50 MCG/ACT Aepb Generic drug: fluticasone-salmeterol        All past medical history, surgical history, allergies, family history, immunizations andmedications were updated in the EMR today and reviewed under the history  and medication portions of their EMR.     ROS Negative, with the exception of above mentioned in HPI   Objective:  BP 106/66   Pulse 88   Temp 99 F (37.2 C)   Wt 201 lb 6.4 oz (91.4 kg)   LMP 04/03/2013   SpO2 97%   BMI 38.05 kg/m  Body mass index is 38.05 kg/m. Physical Exam Vitals and nursing note reviewed.  Constitutional:      General: She is not in acute distress.    Appearance: Normal appearance. She is normal weight. She is not ill-appearing or toxic-appearing.  HENT:     Head: Normocephalic and atraumatic.  Eyes:     General: No scleral icterus.       Right eye: No discharge.        Left eye: No discharge.     Extraocular Movements: Extraocular movements intact.     Conjunctiva/sclera: Conjunctivae normal.     Pupils: Pupils are equal, round, and reactive to light.  Cardiovascular:     Rate and Rhythm: Normal rate and regular rhythm.  Skin:    Findings: No rash.  Neurological:     Mental Status: She is alert and oriented to person, place, and time. Mental status is at baseline.     Motor: No weakness.     Coordination: Coordination normal.     Gait: Gait normal.  Psychiatric:        Mood and Affect: Mood normal.        Behavior: Behavior normal.        Thought Content: Thought content normal.        Judgment: Judgment normal.     No results found. No results found. No results found for this or any previous visit (from the past 24 hour(s)).  Assessment/Plan: [name] is a 62 y.o. female present for OV for  Morbid obesity (HCC)/Elevated hemoglobin A1c/Weight loss counseling, encounter for Start  weight 201 lbs. Start BMI 38.05 Patient was counseled on exercise, calorie counting, weight loss and potential medications to help with weight loss today. -Patient was provided with online resources for: Weekly net calorie calculator.  Applications for calorie counting.  Patient was advised to ensure she is taking in adequate nutrition daily by meeting calorie goals. -Patient was educated on dietary changes to not only lose weight but to eat healthy.  Patient was educated on glycemic index. -Patient was educated on exercise goal of 150 minutes a week (plus warm up and cool down) of cardiovascular exercise.  Patient was educated on heart rate for cardiovascular and fat burning zones. -Patient was encouraged to maintain adequate water consumption of at least 80 ounces a day, more if exercising/sweating. Wegovy 0.25 mg prescribed. Follow-up in 4 weeks  Reviewed expectations re: course of current medical issues. Discussed self-management of symptoms. Outlined signs and symptoms indicating need for more acute intervention. Patient verbalized understanding and all questions were answered. Patient received an After-Visit Summary.    No orders of the defined types were placed in this encounter.  Meds ordered this encounter  Medications   WEGOVY 0.25 MG/0.5ML SOAJ    Sig: Inject 0.25 mg into the skin once a week.    Dispense:  2 mL    Refill:  0   Referral Orders  No referral(s) requested today     Note is dictated utilizing voice recognition software. Although note has been proof read prior to signing, occasional typographical errors still can be missed. If any  questions arise, please do not hesitate to call for verification.   electronically signed by:  Howard Pouch, DO  [place]

## 2022-05-24 ENCOUNTER — Other Ambulatory Visit (HOSPITAL_COMMUNITY): Payer: Self-pay

## 2022-05-24 ENCOUNTER — Telehealth: Payer: Self-pay

## 2022-05-24 NOTE — Telephone Encounter (Signed)
Pharmacy Patient Advocate Encounter   Received notification from [facility] that prior authorization for [facility] 0.25MG/0.5ML auto-injectors is required/requested.  Per Test Claim: PA required   PA submitted on 05/24/22 to (ins) [employer] via CoverMyMeds Key [id number] Status is pending

## 2022-05-26 ENCOUNTER — Other Ambulatory Visit (HOSPITAL_COMMUNITY): Payer: Self-pay

## 2022-05-26 NOTE — Telephone Encounter (Signed)
noted 

## 2022-05-26 NOTE — Telephone Encounter (Signed)
Patient Advocate Encounter  Prior Authorization for [employer] 0.25MG/0.5ML auto-injectors has been approved.    PA# [id number] Effective dates: 04/24/22 through 11/20/22

## 2022-05-30 ENCOUNTER — Encounter: Payer: Self-pay | Admitting: Family Medicine

## 2022-05-31 ENCOUNTER — Encounter: Payer: Self-pay | Admitting: Family Medicine

## 2022-05-31 ENCOUNTER — Telehealth: Payer: Self-pay | Admitting: Internal Medicine

## 2022-05-31 MED ORDER — PREDNISONE 10 MG PO TABS: ORAL_TABLET | ORAL | 0 refills | Status: AC

## 2022-05-31 MED ORDER — AZITHROMYCIN 250 MG PO TABS: ORAL_TABLET | ORAL | 0 refills | Status: AC

## 2022-05-31 NOTE — Telephone Encounter (Signed)
Patient states having symptoms of cough, wheezing and nasal congestion. [facility]. Patient phone number is [phone].

## 2022-05-31 NOTE — Telephone Encounter (Signed)
Called and spoke with patient. Patient verbalized understanding. Prescriptions have been sent to patients pharmacy.   Nothing further needed.

## 2022-05-31 NOTE — Telephone Encounter (Signed)
Ok to send prednisone 10 mg, # 20, 4 X 2 DAYS, 3 X 2 DAYS, 2 X 2 DAYS, 1 X 2 DAYS  She can also have a Zpak 250 mg, # 6, 2 today then one daily, if she thinks she needs it.

## 2022-05-31 NOTE — Telephone Encounter (Signed)
Called and spoke with patient. Patient stated that she is having the same episode she had back in January when she came to see Dr. Annamaria Boots. Patient also stated that she's had a lot of wheezing, green mucus coming out of her nose, and coughing up clear and green phlegm.   Patient wants to know if we can call a steroid in for her.   CY, please advise.

## 2022-06-18 ENCOUNTER — Other Ambulatory Visit: Payer: Self-pay | Admitting: *Deleted

## 2022-06-18 ENCOUNTER — Ambulatory Visit: Payer: Medicare Other | Admitting: Family Medicine

## 2022-06-18 DIAGNOSIS — J4489 Other specified chronic obstructive pulmonary disease: Secondary | ICD-10-CM

## 2022-06-18 MED ORDER — MONTELUKAST SODIUM 10 MG PO TABS: 10.0000 mg | ORAL_TABLET | Freq: Every day | ORAL | 3 refills | Status: DC

## 2022-06-24 ENCOUNTER — Ambulatory Visit (INDEPENDENT_AMBULATORY_CARE_PROVIDER_SITE_OTHER): Payer: Medicare Other | Admitting: Family Medicine

## 2022-06-24 ENCOUNTER — Encounter: Payer: Self-pay | Admitting: Family Medicine

## 2022-06-24 DIAGNOSIS — R7309 Other abnormal glucose: Secondary | ICD-10-CM

## 2022-06-24 DIAGNOSIS — Z713 Dietary counseling and surveillance: Secondary | ICD-10-CM

## 2022-06-24 DIAGNOSIS — I10 Essential (primary) hypertension: Secondary | ICD-10-CM | POA: Diagnosis not present

## 2022-06-24 DIAGNOSIS — E782 Mixed hyperlipidemia: Secondary | ICD-10-CM

## 2022-06-24 MED ORDER — WEGOVY 0.5 MG/0.5ML ~~LOC~~ SOAJ: 0.5000 mg | SUBCUTANEOUS | 0 refills | Status: DC

## 2022-06-24 NOTE — Progress Notes (Signed)
[name] , 1960/04/12, 62 y.o., female MRN: [id number] Patient Care Team    Relationship Specialty Notifications Start End  Betty Hillock, DO PCP - General Family Medicine  03/05/15   Betty Hanson, MD Referring Physician Urology  04/08/17   Betty Signs, MD Referring Physician Gastroenterology  04/08/17   Betty Lever, MD Consulting Physician Pulmonary Disease  04/08/17   Betty Few, MD Consulting Physician Obstetrics and Gynecology  06/05/21     Chief Complaint  Patient presents with   Obesity     Subjective: [name] is a 62 y.o. Pt presents for an OV to discuss obesity. Weight (lbs): 201> 191 BMI 38.05> 36.2 Exercise: [employer] x3 week.  Water consumption:struggling. Trying 80 ounces, also drinking sugar free electrolyte drink.  Diet: lean meat and veggies, berries.   Patient has an elevated A1c of 6.3, hypertension, hyperlipidemia and morbid obesity.     06/24/2022    9:13 AM 05/21/2022    8:32 AM 11/20/2021   10:53 AM 11/14/2021   10:10 AM 11/12/2020    9:18 AM  Depression screen PHQ 2/9  Decreased Interest 0 0 0 0 0  Down, Depressed, Hopeless 0 0 0 0 0  PHQ - 2 Score 0 0 0 0 0  Altered sleeping  1 0    Tired, decreased energy  1 0    Change in appetite  3 0    Feeling bad or failure about yourself   0 0    Trouble concentrating  0 0    Moving slowly or fidgety/restless  0 0    Suicidal thoughts  0 0    PHQ-9 Score  5 0    Difficult doing work/chores  Somewhat difficult       Allergies  Allergen Reactions   Fosamax [Alendronate] Other (See Comments)    Severe GERD   Social History   Social History Narrative   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   Lives with her husband, son and granddaughter.   Drinks caffeinated beverages.   Wears her seatbelt, wears a bicycle helmet, there is a smoke detector in home. There are no firearms in the home.   Patient feels safe in  her relationships.   Past Medical History:  Diagnosis Date   Anxiety    psychiatrist: Dr. Robina Ade    Arthritis    Asthma    Bronchitis    Colitis    Colon polyps    COVID-19 09/2020   Depression    psychiatrist: Dr. Robina Ade   Elevated liver enzymes    Dr. Rulon Abide following   Fatty liver    GERD (gastroesophageal reflux disease)    Heart murmur    History of chicken pox    History of kidney stones    Hypertension    Multiple pulmonary nodules determined by computed tomography of lung 03/07/2014   Last CT suggested clearance of tree-in-bud.  Follows with Dr. Tarri Fuller     Chest CT [facility] 2015-micronodules, groundglass, tree in bud right upper lobe  CT chest 03/15/16 No evidence of interstitial lung disease or mycobacterium aviumcomplex. Scattered pulmonary nodules. No f/u needed- low risk   Osteoarthritis of right knee 07/29/2021   Postmenopausal bleeding 06/27/2020   Primary osteoarthritis of both knees 04/09/2011   Formatting of this note might be different from the original. Overview:  S/p left total knee replacement   Renal cell carcinoma  hx of left and right; Dr. Nena Alexander   Trochanteric bursitis, left hip 09/27/2018   Past Surgical History:  Procedure Laterality Date   CHOLECYSTECTOMY     FETAL RADIO FREQUENCY ABLATION     FRACTURE SURGERY Left    ankle   JOINT REPLACEMENT Left 2009   knee   NEPHRECTOMY     partial LT   OPEN SURGICAL REPAIR OF GLUTEAL TENDON Left 09/27/2018   Procedure: Left hip bursectomy; gluteal tendon repair;  Surgeon: Gaynelle Arabian, MD;  Location: [facility];  Service: Orthopedics;  Laterality: Left;  14min   TONSILLECTOMY     TOTAL KNEE ARTHROPLASTY Right 12/21/2021   Procedure: TOTAL KNEE ARTHROPLASTY;  Surgeon: Gaynelle Arabian, MD;  Location: [facility];  Service: Orthopedics;  Laterality: Right;   Family History  Problem Relation Age of Onset   Hypertension Mother    Alzheimer's disease Mother    Arthritis Mother    Hypertension Father    Alzheimer's  disease Father    COPD Father    Heart disease Father    Arthritis Father    Hypertension Sister    Allergies as of 06/24/2022       Reactions   Fosamax [alendronate] Other (See Comments)   Severe GERD        Medication List        Accurate as of June 24, 2022  9:33 AM. If you have any questions, ask your nurse or doctor.          albuterol 108 (90 Base) MCG/ACT inhaler Commonly known as: VENTOLIN HFA Inhale 1-2 puffs into the lungs every 6 (six) hours as needed for shortness of breath.   albuterol (2.5 MG/3ML) 0.083% nebulizer solution Commonly known as: PROVENTIL Take 3 mLs (2.5 mg total) by nebulization every 6 (six) hours as needed for wheezing or shortness of breath.   ALPRAZolam 0.5 MG tablet Commonly known as: XANAX Take 0.5 mg by mouth 2 (two) times daily as needed for anxiety.   hydrochlorothiazide 25 MG tablet Commonly known as: HYDRODIURIL Take 1 tablet (25 mg total) by mouth daily.   MAGNESIUM PO Take 1 tablet by mouth in the morning.   montelukast 10 MG tablet Commonly known as: SINGULAIR Take 1 tablet (10 mg total) by mouth at bedtime.   OLANZapine-FLUoxetine 3-25 MG capsule Commonly known as: SYMBYAX Take 1 capsule by mouth at bedtime.   potassium chloride SA 20 MEQ tablet Commonly known as: KLOR-CON M Take 2 tablets (40 mEq total) by mouth at bedtime.   PSYLLIUM HUSK PO Take 2 capsules by mouth in the morning and at bedtime.   RED YEAST RICE PO Take 2 capsules by mouth in the morning. Bluebonnet CholesteRice Supplement (Red yeast rice/Plant Sterols/Pantethine/CoQ10/Policosanol)   VITAMIN D-3 PO Take 1 tablet by mouth in the morning.   Wegovy 0.25 MG/0.5ML Soaj Generic drug: Semaglutide-Weight Management Inject 0.25 mg into the skin once a week. What changed: Another medication with the same name was added. Make sure you understand how and when to take each. Changed by: Howard Pouch, DO   Wegovy 0.5 MG/0.5ML Soaj Generic drug:  Semaglutide-Weight Management Inject 0.5 mg into the skin once a week. What changed: You were already taking a medication with the same name, and this prescription was added. Make sure you understand how and when to take each. Changed by: Howard Pouch, DO   Wixela Inhub 100-50 MCG/ACT Aepb Generic drug: fluticasone-salmeterol        All past medical history, surgical  history, allergies, family history, immunizations andmedications were updated in the EMR today and reviewed under the history and medication portions of their EMR.     ROS Negative, with the exception of above mentioned in HPI  Objective:  BP 111/72   Pulse 79   Temp 97.8 F (36.6 C)   Wt 191 lb 9.6 oz (86.9 kg)   LMP 04/03/2013   SpO2 97%   BMI 36.20 kg/m  Body mass index is 36.2 kg/m. Physical Exam Vitals and nursing note reviewed.  Constitutional:      General: She is not in acute distress.    Appearance: Normal appearance. She is normal weight. She is not ill-appearing or toxic-appearing.  HENT:     Head: Normocephalic and atraumatic.  Eyes:     General: No scleral icterus.       Right eye: No discharge.        Left eye: No discharge.     Extraocular Movements: Extraocular movements intact.     Conjunctiva/sclera: Conjunctivae normal.     Pupils: Pupils are equal, round, and reactive to light.  Skin:    Findings: No rash.  Neurological:     Mental Status: She is alert and oriented to person, place, and time. Mental status is at baseline.     Motor: No weakness.     Coordination: Coordination normal.     Gait: Gait normal.  Psychiatric:        Mood and Affect: Mood normal.        Behavior: Behavior normal.        Thought Content: Thought content normal.        Judgment: Judgment normal.     No results found. No results found. No results found for this or any previous visit (from the past 24 hour(s)).  Assessment/Plan: Jemmah Vanwyhe is a 62 y.o. female present for OV for  Morbid  obesity (HCC)/Elevated hemoglobin A1c/Weight loss counseling, encounter for Doing very well. She has lost 10 lbs.  Patient was counseled on exercise, calorie counting, weight loss and potential medications to help with weight loss today. -Patient was provided with online resources for: Weekly net calorie calculator.  Applications for calorie counting.  Patient was advised to ensure she is taking in adequate nutrition daily by meeting calorie goals. -Patient was educated on dietary changes to not only lose weight but to eat healthy.  Patient was educated on glycemic index. -Patient was educated on exercise goal of 150 minutes a week (plus warm up and cool down) of cardiovascular exercise.  Patient was educated on heart rate for cardiovascular and fat burning zones. -Patient was encouraged to maintain adequate water consumption of at least 80 ounces a day, more if exercising/sweating. Wegovy 0.5 mg prescribed. Follow-up in 4 weeks  Reviewed expectations re: course of current medical issues. Discussed self-management of symptoms. Outlined [name] and symptoms indicating need for more acute intervention. Patient verbalized understanding and all questions were answered. Patient received an After-Visit Summary.    No orders of the defined types were placed in this encounter.  Meds ordered this encounter  Medications   WEGOVY 0.5 MG/0.5ML SOAJ    Sig: Inject 0.5 mg into the skin once a week.    Dispense:  2 mL    Refill:  0   Referral Orders  No referral(s) requested today     Note is dictated utilizing voice recognition software. Although note has been proof read prior to signing, occasional typographical errors still can  be missed. If any questions arise, please do not hesitate to call for verification.   electronically signed by:  Howard Pouch, DO  [place]

## 2022-06-24 NOTE — Patient Instructions (Signed)
Return in about 4 weeks (around 07/22/2022) for weight loss.        Great to see you today.  I have refilled the medication(s) we provide.   If labs were collected, we will inform you of lab results once received either by echart message or telephone call.   - echart message- for normal results that have been seen by the patient already.   - telephone call: abnormal results or if patient has not viewed results in their echart.

## 2022-07-16 ENCOUNTER — Encounter: Payer: Self-pay | Admitting: Internal Medicine

## 2022-07-22 ENCOUNTER — Ambulatory Visit (INDEPENDENT_AMBULATORY_CARE_PROVIDER_SITE_OTHER): Payer: Medicare Other | Admitting: Family Medicine

## 2022-07-22 ENCOUNTER — Encounter: Payer: Self-pay | Admitting: Family Medicine

## 2022-07-22 DIAGNOSIS — Z713 Dietary counseling and surveillance: Secondary | ICD-10-CM | POA: Diagnosis not present

## 2022-07-22 MED ORDER — WEGOVY 1 MG/0.5ML ~~LOC~~ SOAJ: 1.0000 mg | SUBCUTANEOUS | 0 refills | Status: DC

## 2022-07-22 MED ORDER — WEGOVY 1.7 MG/0.75ML ~~LOC~~ SOAJ: 1.7000 mg | SUBCUTANEOUS | 3 refills | Status: DC

## 2022-07-22 NOTE — Patient Instructions (Addendum)
Return in about 6 weeks (around 09/02/2022).        Great to see you today.  I have refilled the medication(s) we provide.   If labs were collected, we will inform you of lab results once received either by echart message or telephone call.   - echart message- for normal results that have been seen by the patient already.   - telephone call: abnormal results or if patient has not viewed results in their echart.

## 2022-07-22 NOTE — Progress Notes (Signed)
[name] , [birth year], 62 y.o., female MRN: [id number] Patient Care Team    Relationship Specialty Notifications Start End  Natalia Leatherwood, DO PCP - General Family Medicine  03/05/15   Melene Plan, MD Referring Physician Urology  04/08/17   Truitt Merle, MD Referring Physician Gastroenterology  04/08/17   Waymon Budge, MD Consulting Physician Pulmonary Disease  04/08/17   Olivia Mackie, MD Consulting Physician Obstetrics and Gynecology  06/05/21     Chief Complaint  Patient presents with   Weight Check     Subjective: [name] is a 62 y.o. Pt presents for an OV to discuss obesity. Weight (lbs): 201> 191>195 BMI 38.05> 36.2>36.84 Exercise: [employer] x3 week> did not get there at all this past week bc husband in the hospital.  Water consumption:struggling. Trying 80 ounces, also drinking sugar free electrolyte drink.  Diet: lean meat and veggies, berries. > fell off th elow glycemic diet this week d/t husband in the hospital Tolerating wegovy tapering. Has one more dose of 0.5 wegovy.  Patient has an elevated A1c of 6.3 (05/07/22), hypertension, hyperlipidemia and morbid obesity.     07/22/2022    9:13 AM 06/24/2022    9:13 AM 05/21/2022    8:32 AM 11/20/2021   10:53 AM 11/14/2021   10:10 AM  Depression screen PHQ 2/9  Decreased Interest 0 0 0 0 0  Down, Depressed, Hopeless 1 0 0 0 0  PHQ - 2 Score 1 0 0 0 0  Altered sleeping   1 0   Tired, decreased energy   1 0   Change in appetite   3 0   Feeling bad or failure about yourself    0 0   Trouble concentrating   0 0   Moving slowly or fidgety/restless   0 0   Suicidal thoughts   0 0   PHQ-9 Score   5 0   Difficult doing work/chores   Somewhat difficult      Allergies  Allergen Reactions   Fosamax [Alendronate] Other (See Comments)    Severe GERD   Social History   Social History Narrative   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    Lives with her husband, son and granddaughter.   Drinks caffeinated beverages.   Wears her seatbelt, wears a bicycle helmet, there is a smoke detector in home. There are no firearms in the home.   Patient feels safe in her relationships.   Past Medical History:  Diagnosis Date   Anxiety    psychiatrist: Dr. Lafayette Dragon    Arthritis    Asthma    Bronchitis    Colitis    Colon polyps    COVID-19 09/2020   Depression    psychiatrist: Dr. Lafayette Dragon   Elevated liver enzymes    Dr. Isla Pence following   Fatty liver    GERD (gastroesophageal reflux disease)    Heart murmur    History of chicken pox    History of kidney stones    Hypertension    Multiple pulmonary nodules determined by computed tomography of lung 03/07/2014   Last CT suggested clearance of tree-in-bud.  Follows with Dr. Joni Fears     Chest CT [facility] 2015-micronodules, groundglass, tree in bud right upper lobe  CT chest 03/15/16 No evidence of interstitial lung disease or mycobacterium aviumcomplex. Scattered pulmonary nodules. No f/u needed- low risk   Osteoarthritis of right  knee 07/29/2021   Postmenopausal bleeding 06/27/2020   Primary osteoarthritis of both knees 04/09/2011   Formatting of this note might be different from the original. Overview:  S/p left total knee replacement   Renal cell carcinoma    hx of left and right; Dr. Laurelyn Sickle   Trochanteric bursitis, left hip 09/27/2018   Past Surgical History:  Procedure Laterality Date   CHOLECYSTECTOMY     FETAL RADIO FREQUENCY ABLATION     FRACTURE SURGERY Left    ankle   JOINT REPLACEMENT Left 2009   knee   NEPHRECTOMY     partial LT   OPEN SURGICAL REPAIR OF GLUTEAL TENDON Left 09/27/2018   Procedure: Left hip bursectomy; gluteal tendon repair;  Surgeon: Ollen Gross, MD;  Location: [facility];  Service: Orthopedics;  Laterality: Left;    TONSILLECTOMY     TOTAL KNEE ARTHROPLASTY Right 12/21/2021   Procedure: TOTAL KNEE ARTHROPLASTY;  Surgeon: Ollen Gross, MD;   Location: [facility];  Service: Orthopedics;  Laterality: Right;   Family History  Problem Relation Age of Onset   Hypertension Mother    Alzheimer's disease Mother    Arthritis Mother    Hypertension Father    Alzheimer's disease Father    COPD Father    Heart disease Father    Arthritis Father    Hypertension Sister    Allergies as of 07/22/2022       Reactions   Fosamax [alendronate] Other (See Comments)   Severe GERD        Medication List        Accurate as of July 22, 2022 10:21 AM. If you have any questions, ask your nurse or doctor.          STOP taking these medications    Wegovy 0.25 MG/0.5ML Soaj Generic drug: Semaglutide-Weight Management Replaced by: Wegovy 1 MG/0.5ML Soaj You also have another medication with the same name that you need to continue taking as instructed. Stopped by: Felix Pacini, DO       TAKE these medications    albuterol 108 (90 Base) MCG/ACT inhaler Commonly known as: VENTOLIN HFA Inhale 1-2 puffs into the lungs every 6 (six) hours as needed for shortness of breath.   albuterol (2.5 MG/3ML) 0.083% nebulizer solution Commonly known as: PROVENTIL Take 3 mLs (2.5 mg total) by nebulization every 6 (six) hours as needed for wheezing or shortness of breath.   ALPRAZolam 0.5 MG tablet Commonly known as: XANAX Take 0.5 mg by mouth 2 (two) times daily as needed for anxiety.   hydrochlorothiazide 25 MG tablet Commonly known as: HYDRODIURIL Take 1 tablet (25 mg total) by mouth daily.   MAGNESIUM PO Take 1 tablet by mouth in the morning.   montelukast 10 MG tablet Commonly known as: SINGULAIR Take 1 tablet (10 mg total) by mouth at bedtime.   OLANZapine-FLUoxetine 3-25 MG capsule Commonly known as: SYMBYAX Take 1 capsule by mouth at bedtime.   potassium chloride SA 20 MEQ tablet Commonly known as: KLOR-CON M Take 2 tablets (40 mEq total) by mouth at bedtime.   PSYLLIUM HUSK PO Take 2 capsules by mouth in the morning and  at bedtime.   RED YEAST RICE PO Take 2 capsules by mouth in the morning. Bluebonnet CholesteRice Supplement (Red yeast rice/Plant Sterols/Pantethine/CoQ10/Policosanol)   VITAMIN D-3 PO Take 1 tablet by mouth in the morning.   Wegovy 0.5 MG/0.5ML Soaj Generic drug: Semaglutide-Weight Management Inject 0.5 mg into the skin once a week. What changed:  Another medication with the same name was added. Make sure you understand how and when to take each. Another medication with the same name was removed. Continue taking this medication, and follow the directions you see here. Changed by: Felix Pacini, DO   MJ.Hock 1 MG/0.5ML Soaj Generic drug: Semaglutide-Weight Management Inject 1 mg into the skin once a week. What changed: You were already taking a medication with the same name, and this prescription was added. Make sure you understand how and when to take each. Replaces: Wegovy 0.25 MG/0.5ML Soaj Changed by: Felix Pacini, DO   Wegovy 1.7 MG/0.75ML Soaj Generic drug: Semaglutide-Weight Management Inject 1.7 mg into the skin once a week. Start taking on: Aug 12, 2022 What changed: You were already taking a medication with the same name, and this prescription was added. Make sure you understand how and when to take each. Changed by: Felix Pacini, DO   Wixela Inhub 100-50 MCG/ACT Aepb Generic drug: fluticasone-salmeterol        All past medical history, surgical history, allergies, family history, immunizations andmedications were updated in the EMR today and reviewed under the history and medication portions of their EMR.     ROS Negative, with the exception of above mentioned in HPI  Objective:  BP 116/77   Pulse 70   Temp 98.1 F (36.7 C)   Wt 195 lb (88.5 kg)   LMP 04/03/2013   SpO2 96%   BMI 36.84 kg/m  Body mass index is 36.84 kg/m. Physical Exam Vitals and nursing note reviewed.  Constitutional:      General: She is not in acute distress.    Appearance: Normal  appearance. She is normal weight. She is not ill-appearing or toxic-appearing.  HENT:     Head: Normocephalic and atraumatic.  Eyes:     General: No scleral icterus.       Right eye: No discharge.        Left eye: No discharge.     Extraocular Movements: Extraocular movements intact.     Conjunctiva/sclera: Conjunctivae normal.     Pupils: Pupils are equal, round, and reactive to light.  Skin:    Findings: No rash.  Neurological:     Mental Status: She is alert and oriented to person, place, and time. Mental status is at baseline.     Motor: No weakness.     Coordination: Coordination normal.     Gait: Gait normal.  Psychiatric:        Mood and Affect: Mood normal.        Behavior: Behavior normal.        Thought Content: Thought content normal.        Judgment: Judgment normal.     No results found. No results found. No results found for this or any previous visit (from the past 24 hour(s)).  Assessment/Plan: [name] is a 62 y.o. female present for OV for  Morbid obesity (HCC)/Elevated hemoglobin A1c/Weight loss counseling, encounter for Goal: no more excuses/self sabotage . Get back on low glycemic diet and exercise.  Patient was counseled on exercise, calorie counting, weight loss and potential medications to help with weight loss today. -Patient was provided with online resources for: Weekly net calorie calculator.  Applications for calorie counting.  Patient was advised to ensure she is taking in adequate nutrition daily by meeting calorie goals. -Patient was educated on dietary changes to not only lose weight but to eat healthy.  Patient was educated on glycemic index. -Patient  was educated on exercise goal of 150 minutes a week (plus warm up and cool down) of cardiovascular exercise.  Patient was educated on heart rate for cardiovascular and fat burning zones. -Patient was encouraged to maintain adequate water consumption of at least 80 ounces a day, more if  exercising/sweating. Increase  Wegovy 1 mg and then 1.7 (both called in) Follow-up in 6 weeks  Reviewed expectations re: course of current medical issues. Discussed self-management of symptoms. Outlined signs and symptoms indicating need for more acute intervention. Patient verbalized understanding and all questions were answered. Patient received an After-Visit Summary.    No orders of the defined types were placed in this encounter.  Meds ordered this encounter  Medications   WEGOVY 1 MG/0.5ML SOAJ    Sig: Inject 1 mg into the skin once a week.    Dispense:  2 mL    Refill:  0   WEGOVY 1.7 MG/0.75ML SOAJ    Sig: Inject 1.7 mg into the skin once a week.    Dispense:  3 mL    Refill:  3   Referral Orders  No referral(s) requested today     Note is dictated utilizing voice recognition software. Although note has been proof read prior to signing, occasional typographical errors still can be missed. If any questions arise, please do not hesitate to call for verification.   electronically signed by:  Felix Pacini, DO   Primary Care - [place]

## 2022-08-27 ENCOUNTER — Other Ambulatory Visit: Payer: Self-pay

## 2022-08-27 ENCOUNTER — Telehealth: Payer: Self-pay | Admitting: Internal Medicine

## 2022-08-27 MED ORDER — AZITHROMYCIN 250 MG PO TABS: ORAL_TABLET | ORAL | 0 refills | Status: DC

## 2022-08-27 MED ORDER — PREDNISONE 10 MG PO TABS: ORAL_TABLET | ORAL | 0 refills | Status: DC

## 2022-08-27 NOTE — Telephone Encounter (Signed)
Zpal 250 mg, # 6 2 today then one daily  Prednisone 10 mg, # 20, 4 X 2 DAYS, 3 X 2 DAYS, 2 X 2 DAYS, 1 X 2 DAYS

## 2022-08-27 NOTE — Telephone Encounter (Signed)
Spoke with patient. She complains of drainage, ears are stopped up, irritation in throat, productive cough with green phlegm, and wheezing Patient has used mucinex,albuterol, wixela and neb treatments-not much relief  Patient feels like she has bronchitis Symptoms started a couple of days ago Patient states she was around grandson at the beginning of the week. States he was coughing and sneezing with an ear infection.  Patient is requesting zpak and prednisone  Pharmacy [facility] in [place]

## 2022-08-27 NOTE — Telephone Encounter (Signed)
Advised patient zpak and prednisone have been sent to pharmacy. NFN

## 2022-08-27 NOTE — Telephone Encounter (Signed)
Patient states having symptoms of cough and wheezing. Pharmacy is [facility]. Patient phone number is [phone].

## 2022-09-06 ENCOUNTER — Encounter: Payer: Self-pay | Admitting: Family Medicine

## 2022-09-06 ENCOUNTER — Ambulatory Visit (INDEPENDENT_AMBULATORY_CARE_PROVIDER_SITE_OTHER): Payer: Medicare Other | Admitting: Family Medicine

## 2022-09-06 DIAGNOSIS — R7309 Other abnormal glucose: Secondary | ICD-10-CM | POA: Diagnosis not present

## 2022-09-06 DIAGNOSIS — I10 Essential (primary) hypertension: Secondary | ICD-10-CM

## 2022-09-06 DIAGNOSIS — Z713 Dietary counseling and surveillance: Secondary | ICD-10-CM | POA: Diagnosis not present

## 2022-09-06 DIAGNOSIS — K529 Noninfective gastroenteritis and colitis, unspecified: Secondary | ICD-10-CM | POA: Diagnosis not present

## 2022-09-06 DIAGNOSIS — E782 Mixed hyperlipidemia: Secondary | ICD-10-CM

## 2022-09-06 DIAGNOSIS — Z6834 Body mass index (BMI) 34.0-34.9, adult: Secondary | ICD-10-CM

## 2022-09-06 MED ORDER — WEGOVY 2.4 MG/0.75ML ~~LOC~~ SOAJ: 2.4000 mg | SUBCUTANEOUS | 5 refills | Status: DC

## 2022-09-06 NOTE — Patient Instructions (Addendum)
Return for has scheduled appt for 7/19.        Great to see you today.  I have refilled the medication(s) we provide.   If labs were collected, we will inform you of lab results once received either by echart message or telephone call.   - echart message- for normal results that have been seen by the patient already.   - telephone call: abnormal results or if patient has not viewed results in their echart.'

## 2022-09-06 NOTE — Progress Notes (Signed)
[name] , 1960-04-17, 62 y.o., female MRN: [id number] Patient Care Team    Relationship Specialty Notifications Start End  Natalia Leatherwood, DO PCP - General Family Medicine  03/05/15   Melene Plan, MD Referring Physician Urology  04/08/17   Truitt Merle, MD Referring Physician Gastroenterology  04/08/17   Waymon Budge, MD Consulting Physician Pulmonary Disease  04/08/17   Olivia Mackie, MD Consulting Physician Obstetrics and Gynecology  06/05/21     Chief Complaint  Patient presents with   Obesity     Subjective: [name] is a 62 y.o. Pt presents for an OV to discuss obesity. Weight (lbs): 201> 191>195> 184 BMI 38.05> 36.2>36.84> 34.84 Exercise: [employer] x3 week> was doing very well, until she came down with bronchitis 2 weeks ago.  Water consumption:getting  80 ounces, also drinking sugar free electrolyte drink.  Diet: lean meat and veggies, berries. > feels the diet is becoming easier to manage Tolerating wegovy tapering, been on wegovy 1.7 mg weekly x2  Patient has an elevated A1c of 6.3 (05/07/22), hypertension, hyperlipidemia and morbid obesity.     07/22/2022    9:13 AM 06/24/2022    9:13 AM 05/21/2022    8:32 AM 11/20/2021   10:53 AM 11/14/2021   10:10 AM  Depression screen PHQ 2/9  Decreased Interest 0 0 0 0 0  Down, Depressed, Hopeless 1 0 0 0 0  PHQ - 2 Score 1 0 0 0 0  Altered sleeping   1 0   Tired, decreased energy   1 0   Change in appetite   3 0   Feeling bad or failure about yourself    0 0   Trouble concentrating   0 0   Moving slowly or fidgety/restless   0 0   Suicidal thoughts   0 0   PHQ-9 Score   5 0   Difficult doing work/chores   Somewhat difficult      Allergies  Allergen Reactions   Fosamax [Alendronate] Other (See Comments)    Severe GERD   Social History   Social History Narrative   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   Lives with her husband,  son and granddaughter.   Drinks caffeinated beverages.   Wears her seatbelt, wears a bicycle helmet, there is a smoke detector in home. There are no firearms in the home.   Patient feels safe in her relationships.   Past Medical History:  Diagnosis Date   Anxiety    psychiatrist: Dr. Lafayette Dragon    Arthritis    Asthma    Bronchitis    Colitis    Colon polyps    COVID-19 09/2020   Depression    psychiatrist: Dr. Lafayette Dragon   Elevated liver enzymes    Dr. Isla Pence following   Fatty liver    GERD (gastroesophageal reflux disease)    Heart murmur    History of chicken pox    History of kidney stones    Hypertension    Multiple pulmonary nodules determined by computed tomography of lung 03/07/2014   Last CT suggested clearance of tree-in-bud.  Follows with Dr. Joni Fears     Chest CT [facility] 2015-micronodules, groundglass, tree in bud right upper lobe  CT chest 03/15/16 No evidence of interstitial lung disease or mycobacterium aviumcomplex. Scattered pulmonary nodules. No f/u needed- low risk   Osteoarthritis of right knee 07/29/2021   Postmenopausal  bleeding 06/27/2020   Primary osteoarthritis of both knees 04/09/2011   Formatting of this note might be different from the original. Overview:  S/p left total knee replacement   Renal cell carcinoma    hx of left and right; Dr. Laurelyn Sickle   Trochanteric bursitis, left hip 09/27/2018   Past Surgical History:  Procedure Laterality Date   CHOLECYSTECTOMY     FETAL RADIO FREQUENCY ABLATION     FRACTURE SURGERY Left    ankle   JOINT REPLACEMENT Left 2009   knee   NEPHRECTOMY     partial LT   OPEN SURGICAL REPAIR OF GLUTEAL TENDON Left 09/27/2018   Procedure: Left hip bursectomy; gluteal tendon repair;  Surgeon: Ollen Gross, MD;  Location: [facility];  Service: Orthopedics;  Laterality: Left;    TONSILLECTOMY     TOTAL KNEE ARTHROPLASTY Right 12/21/2021   Procedure: TOTAL KNEE ARTHROPLASTY;  Surgeon: Ollen Gross, MD;  Location: [facility];  Service:  Orthopedics;  Laterality: Right;   Family History  Problem Relation Age of Onset   Hypertension Mother    Alzheimer's disease Mother    Arthritis Mother    Hypertension Father    Alzheimer's disease Father    COPD Father    Heart disease Father    Arthritis Father    Hypertension Sister    Allergies as of 09/06/2022       Reactions   Fosamax [alendronate] Other (See Comments)   Severe GERD        Medication List        Accurate as of September 06, 2022  9:24 AM. If you have any questions, ask your nurse or doctor.          STOP taking these medications    azithromycin 250 MG tablet Commonly known as: Zithromax Stopped by: Felix Pacini, DO   predniSONE 10 MG tablet Commonly known as: DELTASONE Stopped by: Felix Pacini, DO   Wegovy 0.5 MG/0.5ML Soaj Generic drug: Semaglutide-Weight Management Replaced by: Wegovy 2.4 MG/0.75ML Soaj You also have another medication with the same name that you need to continue taking as instructed. Stopped by: Felix Pacini, DO       TAKE these medications    albuterol 108 (90 Base) MCG/ACT inhaler Commonly known as: VENTOLIN HFA Inhale 1-2 puffs into the lungs every 6 (six) hours as needed for shortness of breath.   albuterol (2.5 MG/3ML) 0.083% nebulizer solution Commonly known as: PROVENTIL Take 3 mLs (2.5 mg total) by nebulization every 6 (six) hours as needed for wheezing or shortness of breath.   ALPRAZolam 0.5 MG tablet Commonly known as: XANAX Take 0.5 mg by mouth 2 (two) times daily as needed for anxiety.   hydrochlorothiazide 25 MG tablet Commonly known as: HYDRODIURIL Take 1 tablet (25 mg total) by mouth daily.   MAGNESIUM PO Take 1 tablet by mouth in the morning.   montelukast 10 MG tablet Commonly known as: SINGULAIR Take 1 tablet (10 mg total) by mouth at bedtime.   OLANZapine-FLUoxetine 3-25 MG capsule Commonly known as: SYMBYAX Take 1 capsule by mouth at bedtime.   potassium chloride SA 20 MEQ  tablet Commonly known as: KLOR-CON M Take 2 tablets (40 mEq total) by mouth at bedtime.   PSYLLIUM HUSK PO Take 2 capsules by mouth in the morning and at bedtime.   RED YEAST RICE PO Take 2 capsules by mouth in the morning. Bluebonnet CholesteRice Supplement (Red yeast rice/Plant Sterols/Pantethine/CoQ10/Policosanol)   VITAMIN D-3 PO Take 1 tablet  by mouth in the morning.   Wegovy 1.7 MG/0.75ML Soaj Generic drug: Semaglutide-Weight Management Inject 1.7 mg into the skin once a week. What changed:  Another medication with the same name was added. Make sure you understand how and when to take each. Another medication with the same name was removed. Continue taking this medication, and follow the directions you see here. Changed by: Felix Pacini, DO   Wegovy 2.4 MG/0.75ML Soaj Generic drug: Semaglutide-Weight Management Inject 2.4 mg into the skin once a week. What changed: You were already taking a medication with the same name, and this prescription was added. Make sure you understand how and when to take each. Replaces: Wegovy 0.5 MG/0.5ML Soaj Changed by: Felix Pacini, DO   Wixela Inhub 100-50 MCG/ACT Aepb Generic drug: fluticasone-salmeterol        All past medical history, surgical history, allergies, family history, immunizations andmedications were updated in the EMR today and reviewed under the history and medication portions of their EMR.     ROS Negative, with the exception of above mentioned in HPI  Objective:  BP 104/70   Pulse 79   Temp 98.2 F (36.8 C)   Wt 184 lb 6.4 oz (83.6 kg)   LMP 04/03/2013   SpO2 96%   BMI 34.84 kg/m  Body mass index is 34.84 kg/m. Physical Exam Vitals and nursing note reviewed.  Constitutional:      General: She is not in acute distress.    Appearance: Normal appearance. She is normal weight. She is not ill-appearing or toxic-appearing.  HENT:     Head: Normocephalic and atraumatic.  Eyes:     General: No scleral  icterus.       Right eye: No discharge.        Left eye: No discharge.     Extraocular Movements: Extraocular movements intact.     Conjunctiva/sclera: Conjunctivae normal.     Pupils: Pupils are equal, round, and reactive to light.  Skin:    Findings: No rash.  Neurological:     Mental Status: She is alert and oriented to person, place, and time. Mental status is at baseline.     Motor: No weakness.     Coordination: Coordination normal.     Gait: Gait normal.  Psychiatric:        Mood and Affect: Mood normal.        Behavior: Behavior normal.        Thought Content: Thought content normal.        Judgment: Judgment normal.     No results found. No results found. No results found for this or any previous visit (from the past 24 hour(s)).  Assessment/Plan: Otta Osipov is a 62 y.o. female present for OV for  Morbid obesity (HCC)/Elevated hemoglobin A1c/Weight loss counseling, encounter for Patient was counseled on exercise, calorie counting, weight loss and potential medications to help with weight loss today. -Patient was provided with online resources for: Weekly net calorie calculator.  Applications for calorie counting.  Patient was advised to ensure she is taking in adequate nutrition daily by meeting calorie goals. -Patient was educated on dietary changes to not only lose weight but to eat healthy.  Patient was educated on glycemic index. -Patient was educated on exercise goal of 150 minutes a week (plus warm up and cool down) of cardiovascular exercise.  Patient was educated on heart rate for cardiovascular and fat burning zones. -Patient was encouraged to maintain adequate water consumption of at least  80 ounces a day, more if exercising/sweating. Increase  Wegovy 1.7 > complete last 2 doses, then increase to 2.4 mg. If in stock. If higher not in stock she will continue with the 1.7 mg weekly. Follow-up  has a scheduled appt 7/19  Reviewed expectations re: course  of current medical issues. Discussed self-management of symptoms. Outlined signs and symptoms indicating need for more acute intervention. Patient verbalized understanding and all questions were answered. Patient received an After-Visit Summary.    No orders of the defined types were placed in this encounter.  Meds ordered this encounter  Medications   Semaglutide-Weight Management (WEGOVY) 2.4 MG/0.75ML SOAJ    Sig: Inject 2.4 mg into the skin once a week.    Dispense:  3 mL    Refill:  5   Referral Orders  No referral(s) requested today     Note is dictated utilizing voice recognition software. Although note has been proof read prior to signing, occasional typographical errors still can be missed. If any questions arise, please do not hesitate to call for verification.   electronically signed by:  Felix Pacini, DO  [facility] - [place]

## 2022-09-21 ENCOUNTER — Ambulatory Visit
Admission: RE | Admit: 2022-09-21 | Discharge: 2022-09-21 | Disposition: A | Discharge status: Home / Self Care | Payer: Medicare Other | Source: Ambulatory Visit | Attending: Family Medicine | Admitting: Family Medicine

## 2022-09-21 DIAGNOSIS — Z1231 Encounter for screening mammogram for malignant neoplasm of breast: Secondary | ICD-10-CM

## 2022-09-24 ENCOUNTER — Other Ambulatory Visit: Payer: Self-pay | Admitting: Family Medicine

## 2022-09-24 DIAGNOSIS — R928 Other abnormal and inconclusive findings on diagnostic imaging of breast: Secondary | ICD-10-CM

## 2022-10-04 ENCOUNTER — Ambulatory Visit
Admission: RE | Admit: 2022-10-04 | Discharge: 2022-10-04 | Disposition: A | Discharge status: Home / Self Care | Payer: Medicare Other | Source: Ambulatory Visit | Attending: Family Medicine | Admitting: Family Medicine

## 2022-10-04 DIAGNOSIS — N6313 Unspecified lump in the right breast, lower outer quadrant: Secondary | ICD-10-CM | POA: Diagnosis not present

## 2022-10-04 DIAGNOSIS — R928 Other abnormal and inconclusive findings on diagnostic imaging of breast: Secondary | ICD-10-CM

## 2022-10-18 ENCOUNTER — Other Ambulatory Visit: Payer: Self-pay | Admitting: Family Medicine

## 2022-10-18 DIAGNOSIS — N6310 Unspecified lump in the right breast, unspecified quadrant: Secondary | ICD-10-CM

## 2022-10-20 ENCOUNTER — Ambulatory Visit (INDEPENDENT_AMBULATORY_CARE_PROVIDER_SITE_OTHER): Payer: Medicare Other | Admitting: Family Medicine

## 2022-10-20 ENCOUNTER — Encounter: Payer: Self-pay | Admitting: Family Medicine

## 2022-10-20 VITALS — BP 115/73 | HR 65 | Temp 98.3°F | Wt 181.2 lb

## 2022-10-20 DIAGNOSIS — Z87442 Personal history of urinary calculi: Secondary | ICD-10-CM | POA: Diagnosis not present

## 2022-10-20 DIAGNOSIS — R109 Unspecified abdominal pain: Secondary | ICD-10-CM | POA: Diagnosis not present

## 2022-10-20 DIAGNOSIS — C649 Malignant neoplasm of unspecified kidney, except renal pelvis: Secondary | ICD-10-CM

## 2022-10-20 LAB — POC URINALSYSI DIPSTICK (AUTOMATED)
Bilirubin, UA: NEGATIVE
Blood, UA: NEGATIVE
Glucose, UA: NEGATIVE
Ketones, UA: NEGATIVE
Nitrite, UA: NEGATIVE
Protein, UA: POSITIVE — AB
Spec Grav, UA: 1.02 (ref 1.010–1.025)
Urobilinogen, UA: 0.2 E.U./dL
pH, UA: 6 (ref 5.0–8.0)

## 2022-10-20 LAB — CBC WITH DIFFERENTIAL/PLATELET
Basophils Absolute: 0 10*3/uL (ref 0.0–0.1)
Basophils Relative: 0.7 % (ref 0.0–3.0)
Eosinophils Absolute: 0.5 10*3/uL (ref 0.0–0.7)
Eosinophils Relative: 7.7 % — ABNORMAL HIGH (ref 0.0–5.0)
HCT: 41.3 % (ref 36.0–46.0)
Hemoglobin: 13.3 g/dL (ref 12.0–15.0)
Lymphocytes Relative: 31.2 % (ref 12.0–46.0)
Lymphs Abs: 2 10*3/uL (ref 0.7–4.0)
MCHC: 32.1 g/dL (ref 30.0–36.0)
MCV: 89.9 fl (ref 78.0–100.0)
Monocytes Absolute: 0.5 10*3/uL (ref 0.1–1.0)
Monocytes Relative: 7.3 % (ref 3.0–12.0)
Neutro Abs: 3.4 10*3/uL (ref 1.4–7.7)
Neutrophils Relative %: 53.1 % (ref 43.0–77.0)
Platelets: 430 10*3/uL — ABNORMAL HIGH (ref 150.0–400.0)
RBC: 4.6 Mil/uL (ref 3.87–5.11)
RDW: 15.9 % — ABNORMAL HIGH (ref 11.5–15.5)
WBC: 6.4 10*3/uL (ref 4.0–10.5)

## 2022-10-20 LAB — URINALYSIS, ROUTINE W REFLEX MICROSCOPIC
Bilirubin Urine: NEGATIVE
Hgb urine dipstick: NEGATIVE
Ketones, ur: NEGATIVE
Nitrite: NEGATIVE
RBC / HPF: NONE SEEN (ref 0–?)
Specific Gravity, Urine: 1.02 (ref 1.000–1.030)
Total Protein, Urine: NEGATIVE
Urine Glucose: NEGATIVE
Urobilinogen, UA: 0.2 (ref 0.0–1.0)
pH: 6 (ref 5.0–8.0)

## 2022-10-20 LAB — BASIC METABOLIC PANEL
BUN: 15 mg/dL (ref 6–23)
CO2: 28 mEq/L (ref 19–32)
Calcium: 10.3 mg/dL (ref 8.4–10.5)
Chloride: 100 mEq/L (ref 96–112)
Creatinine, Ser: 0.75 mg/dL (ref 0.40–1.20)
GFR: 85.57 mL/min (ref >60.00)
Glucose, Bld: 90 mg/dL (ref 70–99)
Potassium: 4.1 mEq/L (ref 3.5–5.1)
Sodium: 137 mEq/L (ref 135–145)

## 2022-10-20 NOTE — Progress Notes (Signed)
[name] , 1960/10/17, 62 y.o., female MRN: [id number] Patient Care Team    Relationship Specialty Notifications Start End  Natalia Leatherwood, DO PCP - General Family Medicine  03/05/15   Melene Plan, MD Referring Physician Urology  04/08/17   Truitt Merle, MD Referring Physician Gastroenterology  04/08/17   Waymon Budge, MD Consulting Physician Pulmonary Disease  04/08/17   Olivia Mackie, MD Consulting Physician Obstetrics and Gynecology  06/05/21     Chief Complaint  Patient presents with   Flank Pain    Right sided flank pain that started on Sunday. She states she does feel a lot better today.      Subjective: [name] is a 62 y.o. Pt presents for an OV with complaints of right flank pain of 3 days duration.   Pt has tried advil to ease their symptoms.  H/o 2x proc right kidney and partial nephrectomy left-rcc She reports she was working outside the day prior and thought maybe it was muscle pain. She also realized she was dehydrated and increased her water intake last 2 days. She did contact her uro team.  She reports yesterday evening she noticed sediment in the toilet and since then she has had improved symptoms.      10/20/2022    8:39 AM 07/22/2022    9:13 AM 06/24/2022    9:13 AM 05/21/2022    8:32 AM 11/20/2021   10:53 AM  Depression screen PHQ 2/9  Decreased Interest 0 0 0 0 0  Down, Depressed, Hopeless 0 1 0 0 0  PHQ - 2 Score 0 1 0 0 0  Altered sleeping    1 0  Tired, decreased energy    1 0  Change in appetite    3 0  Feeling bad or failure about yourself     0 0  Trouble concentrating    0 0  Moving slowly or fidgety/restless    0 0  Suicidal thoughts    0 0  PHQ-9 Score    5 0  Difficult doing work/chores    Somewhat difficult     Allergies  Allergen Reactions   Fosamax [Alendronate] Other (See Comments)    Severe GERD   Social History   Social History Narrative   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   Lives with her husband, son and granddaughter.   Drinks caffeinated beverages.   Wears her seatbelt, wears a bicycle helmet, there is a smoke detector in home. There are no firearms in the home.   Patient feels safe in her relationships.   Past Medical History:  Diagnosis Date   Anxiety    psychiatrist: Dr. Lafayette Dragon    Arthritis    Asthma    Bronchitis    Colitis    Colon polyps    COVID-19 09/2020   Depression    psychiatrist: Dr. Lafayette Dragon   Elevated liver enzymes    Dr. Isla Pence following   Fatty liver    GERD (gastroesophageal reflux disease)    Heart murmur    History of chicken pox    History of kidney stones    Hypertension    Multiple pulmonary nodules determined by computed tomography of lung 03/07/2014   Last CT suggested clearance of tree-in-bud.  Follows with Dr. Joni Fears     Chest CT [facility] 2015-micronodules, groundglass, tree in bud right upper lobe  CT chest  03/15/16 No evidence of interstitial lung disease or mycobacterium aviumcomplex. Scattered pulmonary nodules. No f/u needed- low risk   Osteoarthritis of right knee 07/29/2021   Postmenopausal bleeding 06/27/2020   Primary osteoarthritis of both knees 04/09/2011   Formatting of this note might be different from the original. Overview:  S/p left total knee replacement   Renal cell carcinoma    hx of left and right; Dr. Laurelyn Sickle   Trochanteric bursitis, left hip 09/27/2018   Past Surgical History:  Procedure Laterality Date   CHOLECYSTECTOMY     FETAL RADIO FREQUENCY ABLATION     FRACTURE SURGERY Left    ankle   JOINT REPLACEMENT Left 2009   knee   NEPHRECTOMY     partial LT   OPEN SURGICAL REPAIR OF GLUTEAL TENDON Left 09/27/2018   Procedure: Left hip bursectomy; gluteal tendon repair;  Surgeon: Ollen Gross, MD;  Location: [facility];  Service: Orthopedics;  Laterality: Left;    TONSILLECTOMY     TOTAL KNEE ARTHROPLASTY Right 12/21/2021   Procedure: TOTAL KNEE  ARTHROPLASTY;  Surgeon: Ollen Gross, MD;  Location: [facility];  Service: Orthopedics;  Laterality: Right;   Family History  Problem Relation Age of Onset   Hypertension Mother    Alzheimer's disease Mother    Arthritis Mother    Hypertension Father    Alzheimer's disease Father    COPD Father    Heart disease Father    Arthritis Father    Hypertension Sister    Allergies as of 10/20/2022       Reactions   Fosamax [alendronate] Other (See Comments)   Severe GERD        Medication List        Accurate as of October 20, 2022  9:03 AM. If you have any questions, ask your nurse or doctor.          albuterol 108 (90 Base) MCG/ACT inhaler Commonly known as: VENTOLIN HFA Inhale 1-2 puffs into the lungs every 6 (six) hours as needed for shortness of breath.   albuterol (2.5 MG/3ML) 0.083% nebulizer solution Commonly known as: PROVENTIL Take 3 mLs (2.5 mg total) by nebulization every 6 (six) hours as needed for wheezing or shortness of breath.   ALPRAZolam 0.5 MG tablet Commonly known as: XANAX Take 0.5 mg by mouth 2 (two) times daily as needed for anxiety.   hydrochlorothiazide 25 MG tablet Commonly known as: HYDRODIURIL Take 1 tablet (25 mg total) by mouth daily.   MAGNESIUM PO Take 1 tablet by mouth in the morning.   montelukast 10 MG tablet Commonly known as: SINGULAIR Take 1 tablet (10 mg total) by mouth at bedtime.   OLANZapine-FLUoxetine 3-25 MG capsule Commonly known as: SYMBYAX Take 1 capsule by mouth at bedtime.   potassium chloride SA 20 MEQ tablet Commonly known as: KLOR-CON M Take 2 tablets (40 mEq total) by mouth at bedtime.   PSYLLIUM HUSK PO Take 2 capsules by mouth in the morning and at bedtime.   RED YEAST RICE PO Take 2 capsules by mouth in the morning. Bluebonnet CholesteRice Supplement (Red yeast rice/Plant Sterols/Pantethine/CoQ10/Policosanol)   VITAMIN D-3 PO Take 1 tablet by mouth in the morning.   Wegovy 1.7 MG/0.75ML Soaj Generic  drug: Semaglutide-Weight Management Inject 1.7 mg into the skin once a week.   Wegovy 2.4 MG/0.75ML Soaj Generic drug: Semaglutide-Weight Management Inject 2.4 mg into the skin once a week.   Wixela Inhub 100-50 MCG/ACT Aepb Generic drug: fluticasone-salmeterol  All past medical history, surgical history, allergies, family history, immunizations andmedications were updated in the EMR today and reviewed under the history and medication portions of their EMR.     Review of Systems  Constitutional:  Negative for chills and fever.  Gastrointestinal:  Negative for constipation, diarrhea, nausea and vomiting.  Genitourinary:  Positive for flank pain. Negative for dysuria, frequency, hematuria and urgency.  Musculoskeletal:  Positive for back pain.  Neurological:  Negative for dizziness.   Negative, with the exception of above mentioned in HPI   Objective:  BP 115/73   Pulse 65   Temp 98.3 F (36.8 C) (Oral)   Wt 181 lb 3.2 oz (82.2 kg)   LMP 04/03/2013   SpO2 99%   BMI 34.24 kg/m  Body mass index is 34.24 kg/m. Physical Exam Vitals and nursing note reviewed.  Constitutional:      General: She is not in acute distress.    Appearance: Normal appearance. She is normal weight. She is not ill-appearing or toxic-appearing.  HENT:     Head: Normocephalic and atraumatic.  Eyes:     General: No scleral icterus.       Right eye: No discharge.        Left eye: No discharge.     Extraocular Movements: Extraocular movements intact.     Conjunctiva/sclera: Conjunctivae normal.     Pupils: Pupils are equal, round, and reactive to light.  Abdominal:     General: Abdomen is flat.     Tenderness: There is no abdominal tenderness. There is no right CVA tenderness, left CVA tenderness, guarding or rebound.  Skin:    Findings: No rash.  Neurological:     Mental Status: She is alert and oriented to person, place, and time. Mental status is at baseline.     Motor: No weakness.      Coordination: Coordination normal.     Gait: Gait normal.  Psychiatric:        Mood and Affect: Mood normal.        Behavior: Behavior normal.        Thought Content: Thought content normal.        Judgment: Judgment normal.      No results found. No results found. Results for orders placed or performed in visit on 10/20/22 (from the past 24 hour(s))  POCT Urinalysis Dipstick (Automated)     Status: Abnormal   Collection Time: 10/20/22  8:44 AM  Result Value Ref Range   Color, UA yellow    Clarity, UA     Glucose, UA Negative Negative   Bilirubin, UA negative    Ketones, UA negative    Spec Grav, UA 1.020 1.010 - 1.025   Blood, UA neg    pH, UA 6.0 5.0 - 8.0   Protein, UA Positive (A) Negative   Urobilinogen, UA 0.2 0.2 or 1.0 E.U./dL   Nitrite, UA negative    Leukocytes, UA Trace (A) Negative    Assessment/Plan: [name] is a 62 y.o. female present for OV for  Acute flank pain/h/o RCC/h/o kidney stones Symptoms have improved for her over the last 24 hrs. Possibly passed small stones per her report. With her history, urine and bld work sent to be cautious.  - POCT Urinalysis Dipstick (Automated) - Urinalysis, Routine w reflex microscopic - Urine Culture - Basic Metabolic Panel (BMET) - CBC w/Diff - hydrate. Hydrate. Hydrate.  - since she is improving, and VSS, exam normal> if results indicate need,  will tx abx and/or initiate further work up.   Reviewed expectations re: course of current medical issues. Discussed self-management of symptoms. Outlined signs and symptoms indicating need for more acute intervention. Patient verbalized understanding and all questions were answered. Patient received an After-Visit Summary.    Orders Placed This Encounter  Procedures   Urine Culture   Urinalysis, Routine w reflex microscopic   Basic Metabolic Panel (BMET)   CBC w/Diff   POCT Urinalysis Dipstick (Automated)   No orders of the defined types were placed  in this encounter.  Referral Orders  No referral(s) requested today     Note is dictated utilizing voice recognition software. Although note has been proof read prior to signing, occasional typographical errors still can be missed. If any questions arise, please do not hesitate to call for verification.   electronically signed by:  Felix Pacini, DO  [facility] [place]

## 2022-10-20 NOTE — Patient Instructions (Signed)
 No follow-ups on file.        Great to see you today.   If labs were collected, we will inform you of lab results once received either by echart message or telephone call.   - echart message- for normal results that have been seen by the patient already.   - telephone call: abnormal results or if patient has not viewed results in their echart.    

## 2022-10-21 LAB — URINE CULTURE

## 2022-10-22 ENCOUNTER — Ambulatory Visit (INDEPENDENT_AMBULATORY_CARE_PROVIDER_SITE_OTHER): Payer: Medicare Other | Admitting: Family Medicine

## 2022-10-22 ENCOUNTER — Encounter: Payer: Self-pay | Admitting: Family Medicine

## 2022-10-22 VITALS — BP 105/68 | HR 75 | Temp 98.1°F | Wt 178.2 lb

## 2022-10-22 DIAGNOSIS — E782 Mixed hyperlipidemia: Secondary | ICD-10-CM

## 2022-10-22 DIAGNOSIS — F32A Depression, unspecified: Secondary | ICD-10-CM | POA: Diagnosis not present

## 2022-10-22 DIAGNOSIS — I10 Essential (primary) hypertension: Secondary | ICD-10-CM | POA: Diagnosis not present

## 2022-10-22 DIAGNOSIS — R7309 Other abnormal glucose: Secondary | ICD-10-CM

## 2022-10-22 DIAGNOSIS — Z85528 Personal history of other malignant neoplasm of kidney: Secondary | ICD-10-CM

## 2022-10-22 DIAGNOSIS — K760 Fatty (change of) liver, not elsewhere classified: Secondary | ICD-10-CM

## 2022-10-22 DIAGNOSIS — M85851 Other specified disorders of bone density and structure, right thigh: Secondary | ICD-10-CM

## 2022-10-22 DIAGNOSIS — F419 Anxiety disorder, unspecified: Secondary | ICD-10-CM | POA: Diagnosis not present

## 2022-10-22 DIAGNOSIS — C649 Malignant neoplasm of unspecified kidney, except renal pelvis: Secondary | ICD-10-CM

## 2022-10-22 DIAGNOSIS — E559 Vitamin D deficiency, unspecified: Secondary | ICD-10-CM | POA: Diagnosis not present

## 2022-10-22 LAB — POCT GLYCOSYLATED HEMOGLOBIN (HGB A1C)
HbA1c POC (<> result, manual entry): 5.2 % (ref 4.0–5.6)
HbA1c, POC (controlled diabetic range): 5.2 % (ref 0.0–7.0)
HbA1c, POC (prediabetic range): 5.2 % — AB (ref 5.7–6.4)
Hemoglobin A1C: 5.2 % (ref 4.0–5.6)

## 2022-10-22 MED ORDER — POTASSIUM CHLORIDE CRYS ER 20 MEQ PO TBCR: 40.0000 meq | EXTENDED_RELEASE_TABLET | Freq: Every day | ORAL | 1 refills | Status: DC | PRN

## 2022-10-22 MED ORDER — HYDROCHLOROTHIAZIDE 25 MG PO TABS: 25.0000 mg | ORAL_TABLET | Freq: Every day | ORAL | 1 refills | Status: DC | PRN

## 2022-10-22 NOTE — Patient Instructions (Addendum)
Return in about 6 weeks (around 12/03/2022) for Routine chronic condition follow-up.  - goal: increase exercise- start in home exercise.      Great to see you today.  I have refilled the medication(s) we provide.   If labs were collected, we will inform you of lab results once received either by echart message or telephone call.   - echart message- for normal results that have been seen by the patient already.   - telephone call: abnormal results or if patient has not viewed results in their echart.

## 2022-10-22 NOTE — Progress Notes (Signed)
Patient ID: [name], female  DOB: 12/06/1960, 62 y.o.   MRN: [id number] Patient Care Team    Relationship Specialty Notifications Start End  Natalia Leatherwood, DO PCP - General Family Medicine  03/05/15   Melene Plan, MD Referring Physician Urology  04/08/17   Truitt Merle, MD Referring Physician Gastroenterology  04/08/17   Waymon Budge, MD Consulting Physician Pulmonary Disease  04/08/17   Olivia Mackie, MD Consulting Physician Obstetrics and Gynecology  06/05/21     Chief Complaint  Patient presents with   Hypertension    Subjective: [name] is a 62 y.o.  Female  present for [facility] follow up Hypertension/hyperlipidemia/morbid obesity- bmi >30/hypok: Pt reports compliance with HCTZ 25 mg QD.Marland Kitchen Patient denies chest pain, shortness of breath, dizziness or lower extremity edema.  Pt does not take a  daily baby ASA. Pt is not prescribed statin. She did start red yeast rice and psyllium supplements. Diet: Cutting out soda, sugar Exercise: routinely.  RF: HTN, HLD, obesity, FHx HD   H/O RCC:  Pt has a h/o RCC left kidney with partial nephrectomy and RCC right kidney with cryoablation.  Since her last visit she has undergone some more changes with her renal cell carcinoma. She is following with specialties routinely every 6 months for now.   Obesity: Weight (lbs): 201> 191>195> 184>178 BMI 38.05> 36.2>36.84> 34.84 Exercise: [employer] -not been going> walking some.  Water consumption:getting  80 ounces, also drinking sugar free electrolyte drink.  Diet: lean meat and veggies, berries. > feels the diet is becoming easier to manage Tolerating wegovy 2.4 mg weekly  Patient has an elevated A1c of 6.3 (05/07/22), hypertension, hyperlipidemia and morbid obesity.     10/20/2022    8:39 AM 07/22/2022    9:13 AM 06/24/2022    9:13 AM 05/21/2022    8:32 AM 11/20/2021   10:53 AM  Depression screen PHQ 2/9  Decreased Interest 0 0 0 0 0  Down, Depressed,  Hopeless 0 1 0 0 0  PHQ - 2 Score 0 1 0 0 0  Altered sleeping    1 0  Tired, decreased energy    1 0  Change in appetite    3 0  Feeling bad or failure about yourself     0 0  Trouble concentrating    0 0  Moving slowly or fidgety/restless    0 0  Suicidal thoughts    0 0  PHQ-9 Score    5 0  Difficult doing work/chores    Somewhat difficult       10/20/2022    8:39 AM 05/21/2022    8:33 AM 11/20/2021   10:53 AM 06/27/2019   10:23 AM  GAD 7 : Generalized Anxiety Score  Nervous, Anxious, on Edge 0 1 1 0  Control/stop worrying 0 3 1 0  Worry too much - different things 0 3 1 0  Trouble relaxing 0 3 0 0  Restless 0 0 0 0  Easily annoyed or irritable 0 0 0 0  Afraid - awful might happen 0 1 0 0  Total GAD 7 Score 0 11 3 0  Anxiety Difficulty Not difficult at all Somewhat difficult  Not difficult at all    Immunization History  Administered Date(s) Administered   Influenza Split 01/24/2012, 01/06/2013, 01/03/2014, 12/16/2014, 01/15/2015   Influenza,inj,Quad PF,6+ Mos 12/16/2014, 01/06/2016, 11/25/2016, 12/15/2017, 12/15/2018, 01/07/2020, 12/15/2020, 11/20/2021   Influenza-Unspecified 12/16/2014, 01/06/2016, 11/25/2016, 12/15/2017, 12/15/2018, 01/07/2020  PFIZER(Purple Top)SARS-COV-2 Vaccination 06/21/2019, 07/12/2019, 01/15/2020, 08/26/2020, 12/30/2020   Pneumococcal Conjugate-13 03/24/2015   Pneumococcal Polysaccharide-23 12/17/2013   Td 09/03/2020   Tdap 03/24/2015     Past Medical History:  Diagnosis Date   Anxiety    psychiatrist: Dr. Lafayette Dragon    Arthritis    Asthma    Bronchitis    Colitis    Colon polyps    COVID-19 09/2020   Depression    psychiatrist: Dr. Lafayette Dragon   Elevated liver enzymes    Dr. Isla Pence following   Fatty liver    GERD (gastroesophageal reflux disease)    Heart murmur    History of chicken pox    History of kidney stones    Hypertension    Multiple pulmonary nodules determined by computed tomography of lung 03/07/2014   Last CT suggested  clearance of tree-in-bud.  Follows with Dr. Joni Fears     Chest CT [facility] 2015-micronodules, groundglass, tree in bud right upper lobe  CT chest 03/15/16 No evidence of interstitial lung disease or mycobacterium aviumcomplex. Scattered pulmonary nodules. No f/u needed- low risk   Osteoarthritis of right knee 07/29/2021   Postmenopausal bleeding 06/27/2020   Primary osteoarthritis of both knees 04/09/2011   Formatting of this note might be different from the original. Overview:  S/p left total knee replacement   Renal cell carcinoma    hx of left and right; Dr. Laurelyn Sickle   Trochanteric bursitis, left hip 09/27/2018   Allergies  Allergen Reactions   Fosamax [Alendronate] Other (See Comments)    Severe GERD   Past Surgical History:  Procedure Laterality Date   CHOLECYSTECTOMY     FETAL RADIO FREQUENCY ABLATION     FRACTURE SURGERY Left    ankle   JOINT REPLACEMENT Left 2009   knee   NEPHRECTOMY     partial LT   OPEN SURGICAL REPAIR OF GLUTEAL TENDON Left 09/27/2018   Procedure: Left hip bursectomy; gluteal tendon repair;  Surgeon: Ollen Gross, MD;  Location: [facility];  Service: Orthopedics;  Laterality: Left;    TONSILLECTOMY     TOTAL KNEE ARTHROPLASTY Right 12/21/2021   Procedure: TOTAL KNEE ARTHROPLASTY;  Surgeon: Ollen Gross, MD;  Location: [facility];  Service: Orthopedics;  Laterality: Right;   Family History  Problem Relation Age of Onset   Hypertension Mother    Alzheimer's disease Mother    Arthritis Mother    Hypertension Father    Alzheimer's disease Father    COPD Father    Heart disease Father    Arthritis Father    Hypertension Sister    Social History   Social History Narrative   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   Lives with her husband, son and granddaughter.   Drinks caffeinated beverages.   Wears her seatbelt, wears a bicycle helmet, there is a smoke detector in home. There are no firearms in the home.    Patient feels safe in her relationships.    Allergies as of 10/22/2022       Reactions   Fosamax [alendronate] Other (See Comments)   Severe GERD        Medication List        Accurate as of October 22, 2022 10:01 AM. If you have any questions, ask your nurse or doctor.          albuterol 108 (90 Base) MCG/ACT inhaler Commonly known as: VENTOLIN HFA Inhale 1-2 puffs into the lungs every  6 (six) hours as needed for shortness of breath.   albuterol (2.5 MG/3ML) 0.083% nebulizer solution Commonly known as: PROVENTIL Take 3 mLs (2.5 mg total) by nebulization every 6 (six) hours as needed for wheezing or shortness of breath.   ALPRAZolam 0.5 MG tablet Commonly known as: XANAX Take 0.5 mg by mouth 2 (two) times daily as needed for anxiety.   hydrochlorothiazide 25 MG tablet Commonly known as: HYDRODIURIL Take 1 tablet (25 mg total) by mouth daily as needed. What changed:  when to take this reasons to take this Changed by: Felix Pacini   MAGNESIUM PO Take 1 tablet by mouth in the morning.   montelukast 10 MG tablet Commonly known as: SINGULAIR Take 1 tablet (10 mg total) by mouth at bedtime.   OLANZapine-FLUoxetine 3-25 MG capsule Commonly known as: SYMBYAX Take 1 capsule by mouth at bedtime.   potassium chloride SA 20 MEQ tablet Commonly known as: KLOR-CON M Take 2 tablets (40 mEq total) by mouth daily as needed. What changed:  when to take this reasons to take this Changed by: Felix Pacini   PSYLLIUM HUSK PO Take 2 capsules by mouth in the morning and at bedtime.   RED YEAST RICE PO Take 2 capsules by mouth in the morning. Bluebonnet CholesteRice Supplement (Red yeast rice/Plant Sterols/Pantethine/CoQ10/Policosanol)   VITAMIN D-3 PO Take 1 tablet by mouth in the morning.   Wegovy 1.7 MG/0.75ML Soaj Generic drug: Semaglutide-Weight Management Inject 1.7 mg into the skin once a week.   Wegovy 2.4 MG/0.75ML Soaj Generic drug: Semaglutide-Weight  Management Inject 2.4 mg into the skin once a week.   Wixela Inhub 100-50 MCG/ACT Aepb Generic drug: fluticasone-salmeterol        All past medical history, surgical history, allergies, family history, immunizations andmedications were updated in the EMR today and reviewed under the history and medication portions of their EMR.      ROS: 14 pt review of systems performed and negative (unless mentioned in an HPI)  Objective: BP 105/68   Pulse 75   Temp 98.1 F (36.7 C) (Oral)   Wt 178 lb 3.2 oz (80.8 kg)   LMP 04/03/2013   SpO2 96%   BMI 33.67 kg/m  Physical Exam Vitals and nursing note reviewed.  Constitutional:      General: She is not in acute distress.    Appearance: Normal appearance. She is not ill-appearing, toxic-appearing or diaphoretic.  HENT:     Head: Normocephalic and atraumatic.  Eyes:     General: No scleral icterus.       Right eye: No discharge.        Left eye: No discharge.     Extraocular Movements: Extraocular movements intact.     Conjunctiva/sclera: Conjunctivae normal.     Pupils: Pupils are equal, round, and reactive to light.  Cardiovascular:     Rate and Rhythm: Normal rate and regular rhythm.     Heart sounds: No murmur heard. Pulmonary:     Effort: Pulmonary effort is normal. No respiratory distress.     Breath sounds: Normal breath sounds. No wheezing, rhonchi or rales.  Musculoskeletal:     Cervical back: Neck supple. No tenderness.     Right lower leg: No edema.     Left lower leg: No edema.  Lymphadenopathy:     Cervical: No cervical adenopathy.  Skin:    General: Skin is warm and dry.     Coloration: Skin is not jaundiced or pale.  Findings: No erythema or rash.  Neurological:     Mental Status: She is alert and oriented to person, place, and time. Mental status is at baseline.     Motor: No weakness.     Gait: Gait normal.  Psychiatric:        Mood and Affect: Mood normal.        Behavior: Behavior normal.         Thought Content: Thought content normal.        Judgment: Judgment normal.     Results for orders placed or performed in visit on 10/22/22 (from the past 48 hour(s))  POCT HgB A1C     Status: Abnormal   Collection Time: 10/22/22  9:36 AM  Result Value Ref Range   Hemoglobin A1C 5.2 4.0 - 5.6 %   HbA1c POC (<> result, manual entry) 5.2 4.0 - 5.6 %   HbA1c, POC (prediabetic range) 5.2 (A) 5.7 - 6.4 %   HbA1c, POC (controlled diabetic range) 5.2 0.0 - 7.0 %     Assessment/plan: [name] is a 62 y.o. female present for [facility] Anxiety and depression Follows w/ psych  Hyperlipidemia, unspecified hyperlipidemia type/Morbid obesity (HCC)/hypokalemia stable Continue  HCTZ 25 , but prn for edema. No longer hypertensive since losing weight Continue   Kdur BID . - Goal LDL 100-130.  She will think about statin use if not at goal.  We also discussed Zetia as a potential.  Lengthy discussion today surrounding her concern of statins and her renal function. -For now continue red yeast rice and psyllium supplements.   Labs utd  Vitamin D deficiency/osteopneia - vit d UTD 05/2022 -DEXA UTD  01/02/2021 Pt dcd fosamax 35 weekly> severe GERD  Morbid obesity (HCC)/Elevated hemoglobin A1c/Weight loss counseling, encounter for Patient was counseled on exercise, calorie counting, weight loss and potential medications to help with weight loss today. -Patient was provided with online resources for: Weekly net calorie calculator.  Applications for calorie counting.  Patient was advised to ensure she is taking in adequate nutrition daily by meeting calorie goals. -Patient was educated on dietary changes to not only lose weight but to eat healthy.  Patient was educated on glycemic index. -Patient was educated on exercise goal of 150 minutes a week (plus warm up and cool down) of cardiovascular exercise.  Patient was educated on heart rate for cardiovascular and fat burning zones. -Patient was  encouraged to maintain adequate water consumption of at least 80 ounces a day, more if exercising/sweating. tolerating  Wegovy 2.4 mg.  - goal: increase exercise- start in home exercise.  - pt now able to come off BP meds and her A1c is 5.2 today !!! Follow-up  6 weeks  Return in about 6 weeks (around 12/03/2022) for Routine chronic condition follow-up.   Orders Placed This Encounter  Procedures   POCT HgB A1C    Meds ordered this encounter  Medications   potassium chloride SA (KLOR-CON M) 20 MEQ tablet    Sig: Take 2 tablets (40 mEq total) by mouth daily as needed.    Dispense:  180 tablet    Refill:  1   hydrochlorothiazide (HYDRODIURIL) 25 MG tablet    Sig: Take 1 tablet (25 mg total) by mouth daily as needed.    Dispense:  90 tablet    Refill:  1    Referral Orders  No referral(s) requested today     Electronically signed by: Felix Pacini, DO [facility] [place]

## 2022-10-23 ENCOUNTER — Encounter: Payer: Self-pay | Admitting: Family Medicine

## 2022-10-23 LAB — URINE CULTURE
MICRO NUMBER:: 15212644
SPECIMEN QUALITY:: ADEQUATE

## 2022-10-25 ENCOUNTER — Telehealth: Payer: Self-pay | Admitting: Family Medicine

## 2022-10-25 MED ORDER — NITROFURANTOIN MONOHYD MACRO 100 MG PO CAPS: 100.0000 mg | ORAL_CAPSULE | Freq: Two times a day (BID) | ORAL | 0 refills | Status: AC

## 2022-10-25 NOTE — Telephone Encounter (Signed)
Please call pt- Her urine cx resulted with a UTI I have called in macrobid abx, BID x7 days to treat UTI

## 2022-10-25 NOTE — Telephone Encounter (Signed)
Spoke with patient regarding results/recommendations.  

## 2022-10-25 NOTE — Telephone Encounter (Signed)
No further action needed.

## 2022-10-28 ENCOUNTER — Encounter: Payer: Self-pay | Admitting: Family Medicine

## 2022-10-29 NOTE — Telephone Encounter (Signed)
Patient does not need a different antibiotic, since the bacteria is sensitive to the Macrobid.  If she is having increased pain, she will need to be seen in the emergency room to have stat imaging completed

## 2022-10-29 NOTE — Telephone Encounter (Signed)
Pt made aware

## 2022-11-03 ENCOUNTER — Encounter (INDEPENDENT_AMBULATORY_CARE_PROVIDER_SITE_OTHER): Payer: Self-pay

## 2022-11-17 ENCOUNTER — Ambulatory Visit (INDEPENDENT_AMBULATORY_CARE_PROVIDER_SITE_OTHER): Payer: Medicare Other

## 2022-11-17 VITALS — Wt 178.0 lb

## 2022-11-17 DIAGNOSIS — Z0000 Encounter for general adult medical examination without abnormal findings: Secondary | ICD-10-CM

## 2022-11-17 NOTE — Progress Notes (Addendum)
Subjective:   [name] is a 62 y.o. female who presents for Medicare Annual (Subsequent) preventive examination.  Visit Complete: Virtual  I connected with  [name] on 11/22/22 by a video and audio enabled telemedicine application and verified that I am speaking with the correct person using two identifiers.  Patient Location: Home  Provider Location: Home Office  I discussed the limitations of evaluation and management by telemedicine. The patient expressed understanding and agreed to proceed.  Vital Signs: Unable to obtain new vitals due to this being a telehealth visit.  Review of Systems     Cardiac Risk Factors include: advanced age (>85men, >39 women);hypertension;dyslipidemia;obesity (BMI >30kg/m2)     Objective:    Today's Vitals   11/17/22 1011  Weight: 178 lb (80.7 kg)   Body mass index is 33.63 kg/m.     11/17/2022   10:17 AM 05/06/2022    1:50 PM 12/21/2021   11:10 AM 12/08/2021   11:02 AM 11/14/2021    9:55 AM 11/12/2020    9:19 AM 09/27/2018    6:00 PM  Advanced Directives  Does Patient Have a Medical Advance Directive? Yes Yes Yes Yes Yes Yes Yes  Type of Estate agent of [place];Living will Healthcare Power of [place];Living will Healthcare Power of [employer] of [place];Living will Healthcare Power of [place];Living will Living will Healthcare Power of [place];Living will  Does patient want to make changes to medical advance directive?   No - Patient declined  No - Patient declined  No - Patient declined  Copy of Healthcare Power of Attorney in Chart? No - copy requested  No - copy requested No - copy requested No - copy requested  No - copy requested    Current Medications (verified) Outpatient Encounter Medications as of 11/17/2022  Medication Sig   albuterol (PROVENTIL) (2.5 MG/3ML) 0.083% nebulizer solution Take 3 mLs (2.5 mg total) by nebulization every 6 (six) hours as needed for wheezing  or shortness of breath.   albuterol (VENTOLIN HFA) 108 (90 Base) MCG/ACT inhaler Inhale 1-2 puffs into the lungs every 6 (six) hours as needed for shortness of breath.   ALPRAZolam (XANAX) 0.5 MG tablet Take 0.5 mg by mouth 2 (two) times daily as needed for anxiety.   Cholecalciferol (VITAMIN D-3 PO) Take 1 tablet by mouth in the morning.   fluticasone-salmeterol (WIXELA INHUB) 100-50 MCG/ACT AEPB    MAGNESIUM PO Take 1 tablet by mouth in the morning.   montelukast (SINGULAIR) 10 MG tablet Take 1 tablet (10 mg total) by mouth at bedtime.   OLANZapine-FLUoxetine (SYMBYAX) 3-25 MG capsule Take 1 capsule by mouth at bedtime.   PSYLLIUM HUSK PO Take 2 capsules by mouth in the morning and at bedtime.   Red Yeast Rice Extract (RED YEAST RICE PO) Take 2 capsules by mouth in the morning. Blue[place]net CholesteRice Supplement (Red yeast rice/Plant Sterols/Pantethine/CoQ10/Policosanol)   WEGOVY 1.7 MG/0.75ML SOAJ Inject 1.7 mg into the skin once a week. (Patient taking differently: Inject 2.4 mg into the skin once a week.)   [DISCONTINUED] Semaglutide-Weight Management (WEGOVY) 2.4 MG/0.75ML SOAJ Inject 2.4 mg into the skin once a week.   [DISCONTINUED] hydrochlorothiazide (HYDRODIURIL) 25 MG tablet Take 1 tablet (25 mg total) by mouth daily as needed.   [DISCONTINUED] potassium chloride SA (KLOR-CON M) 20 MEQ tablet Take 2 tablets (40 mEq total) by mouth daily as needed.   No facility-administered encounter medications on file as of 11/17/2022.    Allergies (verified)  Fosamax [alendronate]   History: Past Medical History:  Diagnosis Date   Anxiety    psychiatrist: Dr. Lafayette Dragon    Arthritis    Asthma    Bronchitis    Colitis    Colon polyps    COVID-19 09/2020   Depression    psychiatrist: Dr. Lafayette Dragon   Elevated liver enzymes    Dr. Isla Pence following   Fatty liver    GERD (gastroesophageal reflux disease)    Heart murmur    History of chicken pox    History of kidney stones    Hypertension     Multiple pulmonary nodules determined by computed tomography of lung 03/07/2014   Last CT suggested clearance of tree-in-bud.  Follows with Dr. Joni Fears     Chest CT [facility] 2015-micronodules, groundglass, tree in bud right upper lobe  CT chest 03/15/16 No evidence of interstitial lung disease or mycobacterium aviumcomplex. Scattered pulmonary nodules. No f/u needed- low risk   Osteoarthritis of right knee 07/29/2021   Postmenopausal bleeding 06/27/2020   Primary osteoarthritis of both knees 04/09/2011   Formatting of this note might be different from the original. Overview:  S/p left total knee replacement   Renal cell carcinoma    hx of left and right; Dr. Laurelyn Sickle   Trochanteric bursitis, left hip 09/27/2018   Past Surgical History:  Procedure Laterality Date   CHOLECYSTECTOMY     FETAL RADIO FREQUENCY ABLATION     FRACTURE SURGERY Left    ankle   JOINT REPLACEMENT Left 2009   knee   NEPHRECTOMY     partial LT   OPEN SURGICAL REPAIR OF GLUTEAL TENDON Left 09/27/2018   Procedure: Left hip bursectomy; gluteal tendon repair;  Surgeon: Ollen Gross, MD;  Location: [facility];  Service: Orthopedics;  Laterality: Left;    TONSILLECTOMY     TOTAL KNEE ARTHROPLASTY Right 12/21/2021   Procedure: TOTAL KNEE ARTHROPLASTY;  Surgeon: Ollen Gross, MD;  Location: [facility];  Service: Orthopedics;  Laterality: Right;   Family History  Problem Relation Age of Onset   Hypertension Mother    Alzheimer's disease Mother    Arthritis Mother    Hypertension Father    Alzheimer's disease Father    COPD Father    Heart disease Father    Arthritis Father    Hypertension Sister    Social History   Socioeconomic History   Marital status: Married    Spouse name: Not on file   Number of children: Not on file   Years of education: Not on file   Highest education level: Bachelor's degree (e.g., BA, AB, BS)  Occupational History   Not on file  Tobacco Use   Smoking status: Never    Passive  exposure: Never   Smokeless tobacco: Never  Vaping Use   Vaping status: Never Used  Substance and Sexual Activity   Alcohol use: No   Drug use: No   Sexual activity: Yes  Other Topics Concern   Not on file  Social History Narrative   Married. 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   Lives with her husband, son and granddaughter.   Drinks caffeinated beverages.   Wears her seatbelt, wears a bicycle helmet, there is a smoke detector in home. There are no firearms in the home.   Patient feels safe in her relationships.   Social Determinants of Health   Financial Resource Strain: Low Risk  (11/17/2022)   Overall Physicist, medical Strain (  CARDIA)    Difficulty of Paying Living Expenses: Not hard at all  Food Insecurity: No Food Insecurity (11/17/2022)   Hunger Vital Sign    Worried About Running Out of Food in the Last Year: Never true    Ran Out of Food in the Last Year: Never true  Transportation Needs: No Transportation Needs (11/17/2022)   PRAPARE - Administrator, Civil Service (Medical): No    Lack of Transportation (Non-Medical): No  Physical Activity: Insufficiently Active (11/17/2022)   Exercise Vital Sign    Days of Exercise per Week: 3 days    Minutes of Exercise per Session: 40 min  Stress: No Stress Concern Present (11/17/2022)   [employer] of Occupational Health - Occupational Stress Questionnaire    Feeling of Stress : Not at all  Social Connections: Socially Integrated (11/17/2022)   Social Connection and Isolation Panel [NHANES]    Frequency of Communication with Friends and Family: More than three times a week    Frequency of Social Gatherings with Friends and Family: More than three times a week    Attends Religious Services: More than 4 times per year    Active Member of [employer] or Organizations: Yes    Attends Banker Meetings: 1 to 4 times per year    Marital Status: Married    Tobacco  Counseling Counseling given: Not Answered   Clinical Intake:  Pre-visit preparation completed: Yes  Pain : No/denies pain     BMI - recorded: 33.63 Nutritional Status: BMI > 30  Obese Nutritional Risks: None Diabetes: No  How often do you need to have someone help you when you read instructions, pamphlets, or other written materials from your doctor or pharmacy?: 1 - Never  Interpreter Needed?: No  Information entered by :: Lanier Ensign, LPN   Activities of Daily Living    11/17/2022   10:12 AM 12/21/2021   11:10 AM  In your present state of health, do you have any difficulty performing the following activities:  Hearing? 0 0  Vision? 0 0  Difficulty concentrating or making decisions? 0 0  Walking or climbing stairs? 0 0  Dressing or bathing? 0 0  Doing errands, shopping? 0 0  Preparing Food and eating ? N   Using the Toilet? N   In the past six months, have you accidently leaked urine? N   Do you have problems with loss of bowel control? N   Managing your Medications? N   Managing your Finances? N   Housekeeping or managing your Housekeeping? N     Patient Care Team: Natalia Leatherwood, DO as PCP - General (Family Medicine) Melene Plan, MD as Referring Physician (Urology) Truitt Merle, MD as Referring Physician (Gastroenterology) Waymon Budge, MD as Consulting Physician (Pulmonary Disease) Olivia Mackie, MD as Consulting Physician (Obstetrics and Gynecology)  Indicate any recent Medical Services you may have received from other than [facility] providers in the past year (date may be approximate).     Assessment:   This is a routine wellness examination for [facility].  Hearing/Vision screen Hearing Screening - Comments:: Pt denies any hearing issues  Vision Screening - Comments:: [facility] [place]   Dietary issues and exercise activities discussed:     Goals Addressed             This Visit's Progress     Patient Stated       Lose weight  Depression Screen    11/17/2022   10:15 AM 10/20/2022    8:39 AM 07/22/2022    9:13 AM 06/24/2022    9:13 AM 05/21/2022    8:32 AM 11/20/2021   10:53 AM 11/14/2021   10:10 AM  PHQ 2/9 Scores  PHQ - 2 Score 0 0 1 0 0 0 0  PHQ- 9 Score     5 0     Fall Risk    11/17/2022   10:17 AM 10/20/2022    8:39 AM 07/22/2022    9:13 AM 05/21/2022    8:32 AM 11/14/2021   10:10 AM  Fall Risk   Falls in the past year? 1 0 0 1 0  Number falls in past yr: 1 0 0 1 0  Injury with Fall? 1 0 0 1 0  Comment skinned elbow and knee left side      Risk for fall due to : Impaired vision No Fall Risks  History of fall(s)   Follow up Falls prevention discussed Falls evaluation completed Falls evaluation completed Falls evaluation completed Falls evaluation completed    MEDICARE RISK AT HOME:    TIMED UP AND GO:  Was the test performed?  No    Cognitive Function:        11/17/2022   10:20 AM 11/14/2021   10:14 AM  6CIT Screen  What Year? 0 points 0 points  What month? 0 points 0 points  What time? 0 points 0 points  Count back from 20 0 points 0 points  Months in reverse 0 points 0 points  Repeat phrase 0 points 0 points  Total Score 0 points 0 points    Immunizations Immunization History  Administered Date(s) Administered   Influenza Split 01/24/2012, 01/06/2013, 01/03/2014, 12/16/2014, 01/15/2015   Influenza,inj,Quad PF,6+ Mos 12/16/2014, 01/06/2016, 11/25/2016, 12/15/2017, 12/15/2018, 01/07/2020, 12/15/2020, 11/20/2021   Influenza-Unspecified 12/16/2014, 01/06/2016, 11/25/2016, 12/15/2017, 12/15/2018, 01/07/2020   PFIZER(Purple Top)SARS-COV-2 Vaccination 06/21/2019, 07/12/2019, 01/15/2020, 08/26/2020, 12/30/2020   Pneumococcal Conjugate-13 03/24/2015   Pneumococcal Polysaccharide-23 12/17/2013   Td 09/03/2020   Tdap 03/24/2015    TDAP status: Up to date  Flu Vaccine status: Due, Education has been provided regarding the importance of this  vaccine. Advised may receive this vaccine at local pharmacy or Health Dept. Aware to provide a copy of the vaccination record if obtained from local pharmacy or Health Dept. Verbalized acceptance and understanding.  Pneumococcal vaccine status: Up to date  Covid-19 vaccine status: Completed vaccines  Qualifies for Shingles Vaccine? Yes   Zostavax completed No   Shingrix Completed?: No.    Education has been provided regarding the importance of this vaccine. Patient has been advised to call insurance company to determine out of pocket expense if they have not yet received this vaccine. Advised may also receive vaccine at local pharmacy or Health Dept. Verbalized acceptance and understanding.  Screening Tests Health Maintenance  Topic Date Due   INFLUENZA VACCINE  07/04/2023 (Originally 11/04/2022)   Medicare Annual Wellness (AWV)  11/17/2023   DEXA SCAN  09/03/2024   MAMMOGRAM  09/20/2024   PAP SMEAR-Modifier  07/29/2025   Colonoscopy  02/23/2026   DTaP/Tdap/Td (3 - Td or Tdap) 09/04/2030   Hepatitis C Screening  Completed   HIV Screening  Completed   HPV VACCINES  Aged Out   COVID-19 Vaccine  Discontinued   Zoster Vaccines- Shingrix  Discontinued    Health Maintenance  There are no preventive care reminders to display for this patient.   Colorectal  cancer screening: Type of screening: Colonoscopy. Completed 06/05/21. Repeat every 10 years  Mammogram status: Completed 09/21/22. Repeat every year  Bone Density status: Completed 09/03/21. Results reflect: Bone density results: OSTEOPENIA. Repeat every 2 years.  Additional Screening:  Hepatitis C Screening:  Completed 03/24/15  Vision Screening: Recommended annual ophthalmology exams for early detection of glaucoma and other disorders of the eye. Is the patient up to date with their annual eye exam?  Yes  Who is the provider or what is the name of the office in which the patient attends annual eye exams? [facility]  If pt is  not established with a provider, would they like to be referred to a provider to establish care? No .   Dental Screening: Recommended annual dental exams for proper oral hygiene    Community Resource Referral / Chronic Care Management: CRR required this visit?  No   CCM required this visit?  No     Plan:     I have personally reviewed and noted the following in the patient's chart:   Medical and social history Use of alcohol, tobacco or illicit drugs  Current medications and supplements including opioid prescriptions. Patient is not currently taking opioid prescriptions. Functional ability and status Nutritional status Physical activity Advanced directives List of other physicians Hospitalizations, surgeries, and ER visits in previous 12 months Vitals Screenings to include cognitive, depression, and falls Referrals and appointments  In addition, I have reviewed and discussed with patient certain preventive protocols, quality metrics, and best practice recommendations. A written personalized care plan for preventive services as well as general preventive health recommendations were provided to patient.     Marzella Schlein, LPN   [id number]   After Visit Summary: (MyChart) Due to this being a telephonic visit, the after visit summary with patients personalized plan was offered to patient via MyChart   Nurse Notes: none

## 2022-11-18 ENCOUNTER — Telehealth: Payer: Self-pay | Admitting: Internal Medicine

## 2022-11-18 NOTE — Telephone Encounter (Signed)
Is not feeling well. Thought she could beat it on her own. Would like something called in for congestion and more.  Pharm is Therapist, occupational in [place]

## 2022-11-18 NOTE — Telephone Encounter (Signed)
I offered appt for today but PT dcln. I then adv to reach out to PCP due to nurse shortage.

## 2022-11-19 ENCOUNTER — Encounter: Payer: Self-pay | Admitting: Family Medicine

## 2022-11-19 ENCOUNTER — Ambulatory Visit (INDEPENDENT_AMBULATORY_CARE_PROVIDER_SITE_OTHER): Payer: Medicare Other | Admitting: Family Medicine

## 2022-11-19 VITALS — BP 132/77 | HR 66 | Temp 97.4°F | Wt 177.8 lb

## 2022-11-19 DIAGNOSIS — J988 Other specified respiratory disorders: Secondary | ICD-10-CM | POA: Diagnosis not present

## 2022-11-19 DIAGNOSIS — R051 Acute cough: Secondary | ICD-10-CM

## 2022-11-19 DIAGNOSIS — J4521 Mild intermittent asthma with (acute) exacerbation: Secondary | ICD-10-CM

## 2022-11-19 DIAGNOSIS — B9689 Other specified bacterial agents as the cause of diseases classified elsewhere: Secondary | ICD-10-CM

## 2022-11-19 LAB — POCT INFLUENZA A/B
Influenza A, POC: NEGATIVE
Influenza B, POC: NEGATIVE

## 2022-11-19 LAB — POC COVID19 BINAXNOW: SARS Coronavirus 2 Ag: NEGATIVE

## 2022-11-19 MED ORDER — METHYLPREDNISOLONE ACETATE 80 MG/ML IJ SUSP
80.0000 mg | Freq: Once | INTRAMUSCULAR | Status: AC
Start: 2022-11-19 — End: 2022-11-19
  Administered 2022-11-19: 80 mg via INTRAMUSCULAR

## 2022-11-19 MED ORDER — PREDNISONE 20 MG PO TABS: ORAL_TABLET | ORAL | 0 refills | Status: DC

## 2022-11-19 MED ORDER — DOXYCYCLINE HYCLATE 100 MG PO TABS: 100.0000 mg | ORAL_TABLET | Freq: Two times a day (BID) | ORAL | 0 refills | Status: DC

## 2022-11-19 NOTE — Patient Instructions (Signed)

## 2022-11-19 NOTE — Progress Notes (Signed)
[name] , 1960-07-26, 62 y.o., female MRN: [id number] Patient Care Team    Relationship Specialty Notifications Start End  Natalia Leatherwood, DO PCP - General Family Medicine  03/05/15   Melene Plan, MD Referring Physician Urology  04/08/17   Truitt Merle, MD Referring Physician Gastroenterology  04/08/17   Waymon Budge, MD Consulting Physician Pulmonary Disease  04/08/17   Olivia Mackie, MD Consulting Physician Obstetrics and Gynecology  06/05/21     Chief Complaint  Patient presents with   Cough    Congestion; started over the weekend negative covid test yesterday     Subjective: [name] is a 62 y.o. Pt presents for an OV with complaints of cough and congestion of 1 week duration.  Associated symptoms include sinus pressure/pain, PND and fatigue. She tried to get into her pulmonologist, but they were booked. She has a h/o asthma.  Negative covid yesterday.  Pt has tried mucinex dm to ease their symptoms.  Her grandson is ill.      11/17/2022   10:15 AM 10/20/2022    8:39 AM 07/22/2022    9:13 AM 06/24/2022    9:13 AM 05/21/2022    8:32 AM  Depression screen PHQ 2/9  Decreased Interest 0 0 0 0 0  Down, Depressed, Hopeless 0 0 1 0 0  PHQ - 2 Score 0 0 1 0 0  Altered sleeping     1  Tired, decreased energy     1  Change in appetite     3  Feeling bad or failure about yourself      0  Trouble concentrating     0  Moving slowly or fidgety/restless     0  Suicidal thoughts     0  PHQ-9 Score     5  Difficult doing work/chores     Somewhat difficult    Allergies  Allergen Reactions   Fosamax [Alendronate] Other (See Comments)    Severe GERD   Social History   Social History Narrative   Married. 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   Lives with her husband, son and granddaughter.   Drinks caffeinated beverages.   Wears her seatbelt, wears a bicycle helmet, there is a smoke detector in home.  There are no firearms in the home.   Patient feels safe in her relationships.   Past Medical History:  Diagnosis Date   Anxiety    psychiatrist: Dr. Lafayette Dragon    Arthritis    Asthma    Bronchitis    Colitis    Colon polyps    COVID-19 09/2020   Depression    psychiatrist: Dr. Lafayette Dragon   Elevated liver enzymes    Dr. Isla Pence following   Fatty liver    GERD (gastroesophageal reflux disease)    Heart murmur    History of chicken pox    History of kidney stones    Hypertension    Multiple pulmonary nodules determined by computed tomography of lung 03/07/2014   Last CT suggested clearance of tree-in-bud.  Follows with Dr. Joni Fears     Chest CT [facility] 2015-micronodules, groundglass, tree in bud right upper lobe  CT chest 03/15/16 No evidence of interstitial lung disease or mycobacterium aviumcomplex. Scattered pulmonary nodules. No f/u needed- low risk   Osteoarthritis of right knee 07/29/2021   Postmenopausal bleeding 06/27/2020   Primary osteoarthritis of both knees 04/09/2011   Formatting  of this note might be different from the original. Overview:  S/p left total knee replacement   Renal cell carcinoma    hx of left and right; Dr. Laurelyn Sickle   Trochanteric bursitis, left hip 09/27/2018   Past Surgical History:  Procedure Laterality Date   CHOLECYSTECTOMY     FETAL RADIO FREQUENCY ABLATION     FRACTURE SURGERY Left    ankle   JOINT REPLACEMENT Left 2009   knee   NEPHRECTOMY     partial LT   OPEN SURGICAL REPAIR OF GLUTEAL TENDON Left 09/27/2018   Procedure: Left hip bursectomy; gluteal tendon repair;  Surgeon: Ollen Gross, MD;  Location: [facility];  Service: Orthopedics;  Laterality: Left;    TONSILLECTOMY     TOTAL KNEE ARTHROPLASTY Right 12/21/2021   Procedure: TOTAL KNEE ARTHROPLASTY;  Surgeon: Ollen Gross, MD;  Location: [facility];  Service: Orthopedics;  Laterality: Right;   Family History  Problem Relation Age of Onset   Hypertension Mother    Alzheimer's disease Mother     Arthritis Mother    Hypertension Father    Alzheimer's disease Father    COPD Father    Heart disease Father    Arthritis Father    Hypertension Sister    Allergies as of 11/19/2022       Reactions   Fosamax [alendronate] Other (See Comments)   Severe GERD        Medication List        Accurate as of November 19, 2022 11:37 AM. If you have any questions, ask your nurse or doctor.          albuterol 108 (90 Base) MCG/ACT inhaler Commonly known as: VENTOLIN HFA Inhale 1-2 puffs into the lungs every 6 (six) hours as needed for shortness of breath.   albuterol (2.5 MG/3ML) 0.083% nebulizer solution Commonly known as: PROVENTIL Take 3 mLs (2.5 mg total) by nebulization every 6 (six) hours as needed for wheezing or shortness of breath.   ALPRAZolam 0.5 MG tablet Commonly known as: XANAX Take 0.5 mg by mouth 2 (two) times daily as needed for anxiety.   doxycycline 100 MG tablet Commonly known as: VIBRA-TABS Take 1 tablet (100 mg total) by mouth 2 (two) times daily. Started by: Felix Pacini   MAGNESIUM PO Take 1 tablet by mouth in the morning.   montelukast 10 MG tablet Commonly known as: SINGULAIR Take 1 tablet (10 mg total) by mouth at bedtime.   OLANZapine-FLUoxetine 3-25 MG capsule Commonly known as: SYMBYAX Take 1 capsule by mouth at bedtime.   predniSONE 20 MG tablet Commonly known as: DELTASONE 60 mg x2d, 40 mg x3d, 20 mg x2d, 10 mg x2d Start taking on: November 20, 2022 Started by: Felix Pacini   PSYLLIUM HUSK PO Take 2 capsules by mouth in the morning and at bedtime.   RED YEAST RICE PO Take 2 capsules by mouth in the morning. Bluebonnet CholesteRice Supplement (Red yeast rice/Plant Sterols/Pantethine/CoQ10/Policosanol)   VITAMIN D-3 PO Take 1 tablet by mouth in the morning.   Wegovy 1.7 MG/0.75ML Soaj Generic drug: Semaglutide-Weight Management Inject 1.7 mg into the skin once a week. What changed: how much to take   Wixela Inhub 100-50  MCG/ACT Aepb Generic drug: fluticasone-salmeterol        All past medical history, surgical history, allergies, family history, immunizations andmedications were updated in the EMR today and reviewed under the history and medication portions of their EMR.     ROS Negative, with the  exception of above mentioned in HPI   Objective:  BP 132/77   Pulse 66   Temp (!) 97.4 F (36.3 C)   Wt 177 lb 12.8 oz (80.6 kg)   LMP 04/03/2013   SpO2 97%   BMI 33.60 kg/m  Body mass index is 33.6 kg/m. Physical Exam Vitals and nursing note reviewed.  Constitutional:      General: She is not in acute distress.    Appearance: Normal appearance. She is normal weight. She is not ill-appearing or toxic-appearing.  HENT:     Head: Normocephalic and atraumatic.     Right Ear: Tympanic membrane and ear canal normal.     Left Ear: Tympanic membrane and ear canal normal.     Nose: Congestion present. No rhinorrhea.     Mouth/Throat:     Mouth: Mucous membranes are moist.     Pharynx: No oropharyngeal exudate or posterior oropharyngeal erythema.  Eyes:     General: No scleral icterus.       Right eye: No discharge.        Left eye: No discharge.     Extraocular Movements: Extraocular movements intact.     Conjunctiva/sclera: Conjunctivae normal.     Pupils: Pupils are equal, round, and reactive to light.  Cardiovascular:     Rate and Rhythm: Normal rate and regular rhythm.  Pulmonary:     Effort: Pulmonary effort is normal. No respiratory distress.     Breath sounds: Rhonchi present. No wheezing or rales.  Musculoskeletal:     Cervical back: Neck supple.  Lymphadenopathy:     Cervical: No cervical adenopathy.  Skin:    Findings: No rash.  Neurological:     Mental Status: She is alert and oriented to person, place, and time. Mental status is at baseline.     Motor: No weakness.     Coordination: Coordination normal.     Gait: Gait normal.  Psychiatric:        Mood and Affect: Mood  normal.        Behavior: Behavior normal.        Thought Content: Thought content normal.        Judgment: Judgment normal.     No results found. No results found. Results for orders placed or performed in visit on 11/19/22 (from the past 24 hour(s))  POC COVID-19 BinaxNow     Status: None   Collection Time: 11/19/22 10:36 AM  Result Value Ref Range   SARS Coronavirus 2 Ag Negative Negative  POCT Influenza A/B     Status: None   Collection Time: 11/19/22 10:37 AM  Result Value Ref Range   Influenza A, POC Negative Negative   Influenza B, POC Negative Negative    Assessment/Plan: [name] is a 62 y.o. female present for OV for  Acute cough - POC COVID-19 BinaxNow> negative - POCT Influenza A/B> negative  Bacterial respiratory infection Rest, hydrate.  mucinex (DM if cough), nettie pot or nasal saline.  Doxy bid and prescribed, take until completed.  Prednisone taper tomorrow, IM dpeo medrol 80 mg every day provided today F/U 2 weeks if not improved.   Reviewed expectations re: course of current medical issues. Discussed self-management of symptoms. Outlined signs and symptoms indicating need for more acute intervention. Patient verbalized understanding and all questions were answered. Patient received an After-Visit Summary.    Orders Placed This Encounter  Procedures   POC COVID-19 BinaxNow   POCT Influenza A/B   Meds  ordered this encounter  Medications   doxycycline (VIBRA-TABS) 100 MG tablet    Sig: Take 1 tablet (100 mg total) by mouth 2 (two) times daily.    Dispense:  20 tablet    Refill:  0   predniSONE (DELTASONE) 20 MG tablet    Sig: 60 mg x2d, 40 mg x3d, 20 mg x2d, 10 mg x2d    Dispense:  15 tablet    Refill:  0   methylPREDNISolone acetate (DEPO-MEDROL) injection 80 mg   Referral Orders  No referral(s) requested today     Note is dictated utilizing voice recognition software. Although note has been proof read prior to signing,  occasional typographical errors still can be missed. If any questions arise, please do not hesitate to call for verification.   electronically signed by:  Felix Pacini, DO  [facility] - [place]

## 2022-11-22 NOTE — Patient Instructions (Signed)
[name] , Thank you for taking time to come for your Medicare Wellness Visit. I appreciate your ongoing commitment to your health goals. Please review the following plan we discussed and let me know if I can assist you in the future.   Referrals/Orders/Follow-Ups/Clinician Recommendations: per pt lose weight  This is a list of the screening recommended for you and due dates:  Health Maintenance  Topic Date Due   Flu Shot  07/04/2023*   Medicare Annual Wellness Visit  11/17/2023   DEXA scan (bone density measurement)  09/03/2024   Mammogram  09/20/2024   Pap Smear  07/29/2025   Colon Cancer Screening  02/23/2026   DTaP/Tdap/Td vaccine (3 - Td or Tdap) 09/04/2030   Hepatitis C Screening  Completed   HIV Screening  Completed   HPV Vaccine  Aged Out   COVID-19 Vaccine  Discontinued   Zoster (Shingles) Vaccine  Discontinued  *Topic was postponed. The date shown is not the original due date.    Advanced directives: (Copy Requested) Please bring a copy of your health care power of attorney and living will to the office to be added to your chart at your convenience.  Next Medicare Annual Wellness Visit scheduled for next year: Yes  Preventive Care 62 Years and Older, Female Preventive care refers to lifestyle choices and visits with your health care provider that can promote health and wellness. What does preventive care include? A yearly physical exam. This is also called an annual well check. Dental exams once or twice a year. Routine eye exams. Ask your health care provider how often you should have your eyes checked. Personal lifestyle choices, including: Daily care of your teeth and gums. Regular physical activity. Eating a healthy diet. Avoiding tobacco and drug use. Limiting alcohol use. Practicing safe sex. Taking low-dose aspirin every day. Taking vitamin and mineral supplements as recommended by your health care provider. What happens during an annual well check? The  services and screenings done by your health care provider during your annual well check will depend on your age, overall health, lifestyle risk factors, and family history of disease. Counseling  Your health care provider may ask you questions about your: Alcohol use. Tobacco use. Drug use. Emotional well-being. Home and relationship well-being. Sexual activity. Eating habits. History of falls. Memory and ability to understand (cognition). Work and work Astronomer. Reproductive health. Screening  You may have the following tests or measurements: Height, weight, and BMI. Blood pressure. Lipid and cholesterol levels. These may be checked every 5 years, or more frequently if you are over 68 years old. Skin check. Lung cancer screening. You may have this screening every year starting at age 16 if you have a 30-pack-year history of smoking and currently smoke or have quit within the past 15 years. Fecal occult blood test (FOBT) of the stool. You may have this test every year starting at age 103. Flexible sigmoidoscopy or colonoscopy. You may have a sigmoidoscopy every 5 years or a colonoscopy every 10 years starting at age 22. Hepatitis C blood test. Hepatitis B blood test. Sexually transmitted disease (STD) testing. Diabetes screening. This is done by checking your blood sugar (glucose) after you have not eaten for a while (fasting). You may have this done every 1-3 years. Bone density scan. This is done to screen for osteoporosis. You may have this done starting at age 11. Mammogram. This may be done every 1-2 years. Talk to your health care provider about how often you should have  regular mammograms. Talk with your health care provider about your test results, treatment options, and if necessary, the need for more tests. Vaccines  Your health care provider may recommend certain vaccines, such as: Influenza vaccine. This is recommended every year. Tetanus, diphtheria, and acellular  pertussis (Tdap, Td) vaccine. You may need a Td booster every 10 years. Zoster vaccine. You may need this after age 80. Pneumococcal 13-valent conjugate (PCV13) vaccine. One dose is recommended after age 19. Pneumococcal polysaccharide (PPSV23) vaccine. One dose is recommended after age 36. Talk to your health care provider about which screenings and vaccines you need and how often you need them. This information is not intended to replace advice given to you by your health care provider. Make sure you discuss any questions you have with your health care provider. Document Released: 04/18/2015 Document Revised: 12/10/2015 Document Reviewed: 01/21/2015 Elsevier Interactive Patient Education  2017 [employer].  Fall Prevention in the Home Falls can cause injuries. They can happen to people of all ages. There are many things you can do to make your home safe and to help prevent falls. What can I do on the outside of my home? Regularly fix the edges of walkways and driveways and fix any cracks. Remove anything that might make you trip as you walk through a door, such as a raised step or threshold. Trim any bushes or trees on the path to your home. Use bright outdoor lighting. Clear any walking paths of anything that might make someone trip, such as rocks or tools. Regularly check to see if handrails are loose or broken. Make sure that both sides of any steps have handrails. Any raised decks and porches should have guardrails on the edges. Have any leaves, snow, or ice cleared regularly. Use sand or salt on walking paths during winter. Clean up any spills in your garage right away. This includes oil or grease spills. What can I do in the bathroom? Use night lights. Install grab bars by the toilet and in the tub and shower. Do not use towel bars as grab bars. Use non-skid mats or decals in the tub or shower. If you need to sit down in the shower, use a plastic, non-slip stool. Keep the floor  dry. Clean up any water that spills on the floor as soon as it happens. Remove soap buildup in the tub or shower regularly. Attach bath mats securely with double-sided non-slip rug tape. Do not have throw rugs and other things on the floor that can make you trip. What can I do in the bedroom? Use night lights. Make sure that you have a light by your bed that is easy to reach. Do not use any sheets or blankets that are too big for your bed. They should not hang down onto the floor. Have a firm chair that has side arms. You can use this for support while you get dressed. Do not have throw rugs and other things on the floor that can make you trip. What can I do in the kitchen? Clean up any spills right away. Avoid walking on wet floors. Keep items that you use a lot in easy-to-reach places. If you need to reach something above you, use a strong step stool that has a grab bar. Keep electrical cords out of the way. Do not use floor polish or wax that makes floors slippery. If you must use wax, use non-skid floor wax. Do not have throw rugs and other things on the floor that  can make you trip. What can I do with my stairs? Do not leave any items on the stairs. Make sure that there are handrails on both sides of the stairs and use them. Fix handrails that are broken or loose. Make sure that handrails are as long as the stairways. Check any carpeting to make sure that it is firmly attached to the stairs. Fix any carpet that is loose or worn. Avoid having throw rugs at the top or bottom of the stairs. If you do have throw rugs, attach them to the floor with carpet tape. Make sure that you have a light switch at the top of the stairs and the bottom of the stairs. If you do not have them, ask someone to add them for you. What else can I do to help prevent falls? Wear shoes that: Do not have high heels. Have rubber bottoms. Are comfortable and fit you well. Are closed at the toe. Do not wear  sandals. If you use a stepladder: Make sure that it is fully opened. Do not climb a closed stepladder. Make sure that both sides of the stepladder are locked into place. Ask someone to hold it for you, if possible. Clearly mark and make sure that you can see: Any grab bars or handrails. First and last steps. Where the edge of each step is. Use tools that help you move around (mobility aids) if they are needed. These include: Canes. Walkers. Scooters. Crutches. Turn on the lights when you go into a dark area. Replace any light bulbs as soon as they burn out. Set up your furniture so you have a clear path. Avoid moving your furniture around. If any of your floors are uneven, fix them. If there are any pets around you, be aware of where they are. Review your medicines with your doctor. Some medicines can make you feel dizzy. This can increase your chance of falling. Ask your doctor what other things that you can do to help prevent falls. This information is not intended to replace advice given to you by your health care provider. Make sure you discuss any questions you have with your health care provider. Document Released: 01/16/2009 Document Revised: 08/28/2015 Document Reviewed: 04/26/2014 Elsevier Interactive Patient Education  2017 [employer].

## 2022-12-03 ENCOUNTER — Ambulatory Visit: Payer: Medicare Other | Admitting: Family Medicine

## 2022-12-20 ENCOUNTER — Other Ambulatory Visit (HOSPITAL_COMMUNITY): Payer: Self-pay

## 2022-12-20 ENCOUNTER — Telehealth: Payer: Self-pay

## 2022-12-20 NOTE — Telephone Encounter (Signed)
Pharmacy Patient Advocate Encounter   Received notification from CoverMyMeds that prior authorization for [facility] 2.4MG  is required/requested.   Insurance verification completed.   The patient is insured through [employer] .   Per test claim: PA required; PA submitted to [employer] via CoverMyMeds Key/confirmation #/EOC Key: [id number]   Status is pending

## 2022-12-22 ENCOUNTER — Ambulatory Visit: Payer: Medicare Other | Admitting: Family Medicine

## 2022-12-27 ENCOUNTER — Other Ambulatory Visit (HOSPITAL_COMMUNITY): Payer: Self-pay

## 2022-12-27 NOTE — Telephone Encounter (Signed)
Pharmacy Patient Advocate Encounter  Received notification from [employer] that Prior Authorization for [facility] has been APPROVED from 8.17.24 to 9.16.25. Ran test claim, Copay is $43.00. This test claim was processed through [facility]- copay amounts may vary at other pharmacies due to pharmacy/plan contracts, or as the patient moves through the different stages of their insurance plan.   PA #/Case ID/Reference #: Key: [id number]

## 2022-12-28 ENCOUNTER — Encounter: Payer: Self-pay | Admitting: Family Medicine

## 2022-12-28 ENCOUNTER — Ambulatory Visit (INDEPENDENT_AMBULATORY_CARE_PROVIDER_SITE_OTHER): Payer: Medicare Other | Admitting: Family Medicine

## 2022-12-28 VITALS — BP 136/79 | HR 55 | Temp 97.9°F | Wt 172.4 lb

## 2022-12-28 DIAGNOSIS — Z6832 Body mass index (BMI) 32.0-32.9, adult: Secondary | ICD-10-CM

## 2022-12-28 DIAGNOSIS — Z23 Encounter for immunization: Secondary | ICD-10-CM | POA: Diagnosis not present

## 2022-12-28 DIAGNOSIS — R7309 Other abnormal glucose: Secondary | ICD-10-CM | POA: Diagnosis not present

## 2022-12-28 DIAGNOSIS — Z713 Dietary counseling and surveillance: Secondary | ICD-10-CM

## 2022-12-28 MED ORDER — WEGOVY 2.4 MG/0.75ML ~~LOC~~ SOAJ: 2.4000 mg | SUBCUTANEOUS | 5 refills | Status: DC

## 2022-12-28 NOTE — Progress Notes (Signed)
[name] , March 02, 1961, 62 y.o., female MRN: [id number] Patient Care Team    Relationship Specialty Notifications Start End  Betty Leatherwood, DO PCP - General Family Medicine  03/05/15   Betty Plan, MD Referring Physician Urology  04/08/17   Betty Merle, MD Referring Physician Gastroenterology  04/08/17   Betty Budge, MD Consulting Physician Pulmonary Disease  04/08/17   Betty Mackie, MD Consulting Physician Obstetrics and Gynecology  06/05/21     Chief Complaint  Patient presents with   Obesity     Subjective: [name] is a 62 y.o. Pt presents for an OV to discuss obesity. Weight (lbs): 201> 191>195> 184>172 BMI 38.05> 36.2>36.84> 34.84> 32.5 Exercise: working out at home on recombinant bike and walking. Did not like the gym Water consumption:getting  80 ounces, also drinking sugar free electrolyte drink.  Diet: lean meat and veggies, berries. > feels the diet is becoming easier to manage Tolerating wegovy tapering, been on wegovy 2.4 mg   Patient has an elevated A1c of 6.3 (05/07/22), hypertension, hyperlipidemia and morbid obesity.   Vitals - 1 value per visit   Weight (lb)  05/21/2022 201.4   06/24/2022 191.6   07/22/2022 195   09/06/2022 184.4   10/20/2022 181.2   10/22/2022 178.2   11/17/2022 178   11/19/2022 177.8   12/28/2022 172.4         11/17/2022   10:15 AM 10/20/2022    8:39 AM 07/22/2022    9:13 AM 06/24/2022    9:13 AM 05/21/2022    8:32 AM  Depression screen PHQ 2/9  Decreased Interest 0 0 0 0 0  Down, Depressed, Hopeless 0 0 1 0 0  PHQ - 2 Score 0 0 1 0 0  Altered sleeping     1  Tired, decreased energy     1  Change in appetite     3  Feeling bad or failure about yourself      0  Trouble concentrating     0  Moving slowly or fidgety/restless     0  Suicidal thoughts     0  PHQ-9 Score     5  Difficult doing work/chores     Somewhat difficult    No Active Allergies  Social History   Social History  Narrative   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   Lives with her husband, son and granddaughter.   Drinks caffeinated beverages.   Wears her seatbelt, wears a bicycle helmet, there is a smoke detector in home. There are no firearms in the home.   Patient feels safe in her relationships.   Past Medical History:  Diagnosis Date   Anxiety    psychiatrist: Dr. Lafayette Dragon    Arthritis    Asthma    Bronchitis    Colitis    Colon polyps    COVID-19 09/2020   Depression    psychiatrist: Dr. Lafayette Dragon   Elevated liver enzymes    Dr. Isla Pence following   Essential hypertension, benign 03/05/2015   Fatty liver    GERD (gastroesophageal reflux disease)    Heart murmur    History of chicken pox    History of kidney stones    Hypertension    Multiple pulmonary nodules determined by computed tomography of lung 03/07/2014   Last CT suggested clearance of tree-in-bud.  Follows with Dr. Joni Fears     Chest CT  [facility] 2015-micronodules, groundglass, tree in bud right upper lobe  CT chest 03/15/16 No evidence of interstitial lung disease or mycobacterium aviumcomplex. Scattered pulmonary nodules. No f/u needed- low risk   Osteoarthritis of right knee 07/29/2021   Postmenopausal bleeding 06/27/2020   Primary osteoarthritis of both knees 04/09/2011   Formatting of this note might be different from the original. Overview:  S/p left total knee replacement   Renal cell carcinoma    hx of left and right; Dr. Laurelyn Sickle   Trochanteric bursitis, left hip 09/27/2018   Past Surgical History:  Procedure Laterality Date   CHOLECYSTECTOMY     FETAL RADIO FREQUENCY ABLATION     FRACTURE SURGERY Left    ankle   JOINT REPLACEMENT Left 2009   knee   NEPHRECTOMY     partial LT   OPEN SURGICAL REPAIR OF GLUTEAL TENDON Left 09/27/2018   Procedure: Left hip bursectomy; gluteal tendon repair;  Surgeon: Ollen Gross, MD;  Location: [facility];  Service: Orthopedics;  Laterality: Left;     TONSILLECTOMY     TOTAL KNEE ARTHROPLASTY Right 12/21/2021   Procedure: TOTAL KNEE ARTHROPLASTY;  Surgeon: Ollen Gross, MD;  Location: [facility];  Service: Orthopedics;  Laterality: Right;   Family History  Problem Relation Age of Onset   Hypertension Mother    Alzheimer's disease Mother    Arthritis Mother    Hypertension Father    Alzheimer's disease Father    COPD Father    Heart disease Father    Arthritis Father    Hypertension Sister    Allergies as of 12/28/2022   No Active Allergies      Medication List        Accurate as of December 28, 2022 10:46 AM. If you have any questions, ask your nurse or doctor.          STOP taking these medications    doxycycline 100 MG tablet Commonly known as: VIBRA-TABS Stopped by: Felix Pacini   predniSONE 20 MG tablet Commonly known as: DELTASONE Stopped by: Felix Pacini   Wegovy 1.7 MG/0.75ML Soaj Generic drug: Semaglutide-Weight Management Replaced by: Wegovy 2.4 MG/0.75ML Soaj Stopped by: Felix Pacini       TAKE these medications    albuterol 108 (90 Base) MCG/ACT inhaler Commonly known as: VENTOLIN HFA Inhale 1-2 puffs into the lungs every 6 (six) hours as needed for shortness of breath.   albuterol (2.5 MG/3ML) 0.083% nebulizer solution Commonly known as: PROVENTIL Take 3 mLs (2.5 mg total) by nebulization every 6 (six) hours as needed for wheezing or shortness of breath.   ALPRAZolam 0.5 MG tablet Commonly known as: XANAX Take 0.5 mg by mouth 2 (two) times daily as needed for anxiety.   MAGNESIUM PO Take 1 tablet by mouth in the morning.   montelukast 10 MG tablet Commonly known as: SINGULAIR Take 1 tablet (10 mg total) by mouth at bedtime.   OLANZapine-FLUoxetine 3-25 MG capsule Commonly known as: SYMBYAX Take 1 capsule by mouth at bedtime.   PSYLLIUM HUSK PO Take 2 capsules by mouth in the morning and at bedtime.   RED YEAST RICE PO Take 2 capsules by mouth in the morning. [place]  CholesteRice Supplement (Red yeast rice/Plant Sterols/Pantethine/CoQ10/Policosanol)   VITAMIN D-3 PO Take 1 tablet by mouth in the morning.   Wegovy 2.4 MG/0.75ML Soaj Generic drug: Semaglutide-Weight Management Inject 2.4 mg into the skin once a week. Replaces: Wegovy 1.7 MG/0.75ML Soaj Started by: Felix Pacini   Wixela Inhub 100-50  MCG/ACT Aepb Generic drug: fluticasone-salmeterol        All past medical history, surgical history, allergies, family history, immunizations andmedications were updated in the EMR today and reviewed under the history and medication portions of their EMR.     ROS Negative, with the exception of above mentioned in HPI  Objective:  BP 136/79   Pulse (!) 55   Temp 97.9 F (36.6 C)   Wt 172 lb 6.4 oz (78.2 kg)   LMP 04/03/2013   SpO2 (!) 55%   BMI 32.57 kg/m  Body mass index is 32.57 kg/m. Physical Exam Vitals and nursing note reviewed.  Constitutional:      General: She is not in acute distress.    Appearance: Normal appearance. She is normal weight. She is not ill-appearing or toxic-appearing.  HENT:     Head: Normocephalic and atraumatic.  Eyes:     General: No scleral icterus.       Right eye: No discharge.        Left eye: No discharge.     Extraocular Movements: Extraocular movements intact.     Conjunctiva/sclera: Conjunctivae normal.     Pupils: Pupils are equal, round, and reactive to light.  Skin:    Findings: No rash.  Neurological:     Mental Status: She is alert and oriented to person, place, and time. Mental status is at baseline.     Motor: No weakness.     Coordination: Coordination normal.     Gait: Gait normal.  Psychiatric:        Mood and Affect: Mood normal.        Behavior: Behavior normal.        Thought Content: Thought content normal.        Judgment: Judgment normal.     No results found. No results found. No results found for this or any previous visit (from the past 24  hour(s)).  Assessment/[name]: Senaya Linley is a 62 y.o. female present for OV for  Morbid obesity (HCC)/Elevated hemoglobin A1c/Weight loss counseling, encounter for Patient was counseled on exercise, calorie counting, weight loss and potential medications to help with weight loss today. -Patient was provided with online resources for: Weekly net calorie calculator.  Applications for calorie counting.  Patient was advised to ensure she is taking in adequate nutrition daily by meeting calorie goals. -Patient was educated on dietary changes to not only lose weight but to eat healthy.  Patient was educated on glycemic index. -Patient was educated on exercise goal of 150 minutes a week (plus warm up and cool down) of cardiovascular exercise.  Patient was educated on heart rate for cardiovascular and fat burning zones. -Patient was encouraged to maintain adequate water consumption of at least 80 ounces a day, more if exercising/sweating. Continue Wegovy 2.4  mg.  Need for influenza vaccination - Flu vaccine trivalent PF, 6mos and older(Flulaval,Afluria,Fluarix,Fluzone)   Reviewed expectations re: course of current medical issues. Discussed self-management of symptoms. Outlined signs and symptoms indicating need for more acute intervention. Patient verbalized understanding and all questions were answered. Patient received an After-Visit Summary.  Return in about 24 weeks (around 06/14/2023) for Routine chronic condition follow-up.   Orders Placed This Encounter  Procedures   Flu vaccine trivalent PF, 6mos and older(Flulaval,Afluria,Fluarix,Fluzone)   Meds ordered this encounter  Medications   Semaglutide-Weight Management (WEGOVY) 2.4 MG/0.75ML SOAJ    Sig: Inject 2.4 mg into the skin once a week.    Dispense:  3 mL  Refill:  5   Referral Orders  No referral(s) requested today     Note is dictated utilizing voice recognition software. Although note has been proof read prior  to signing, occasional typographical errors still can be missed. If any questions arise, please do not hesitate to call for verification.   electronically signed by:  Felix Pacini, DO  [facility] - [place]

## 2022-12-28 NOTE — Patient Instructions (Addendum)
Return in about 24 weeks (around 06/14/2023) for Routine chronic condition follow-up.      Vitals - 1 value per visit   Weight (lb)  05/21/2022 201.4   06/24/2022 191.6   07/22/2022 195   09/06/2022 184.4   10/20/2022 181.2   10/22/2022 178.2   11/17/2022 178   11/19/2022 177.8   12/28/2022 172.4       Great to see you today.  I have refilled the medication(s) we provide.   If labs were collected or images ordered, we will inform you of  results once we have received them and reviewed. We will contact you either by echart message, or telephone call.  Please give ample time to the testing facility, and our office to run,  receive and review results. Please do not call inquiring of results, even if you can see them in your chart. We will contact you as soon as we are able. If it has been over 1 week since the test was completed, and you have not yet heard from [place], then please call us.    - echart message- for normal results that have been seen by the patient already.   - telephone call: abnormal results or if patient has not viewed results in their echart.  If a referral to a specialist was entered for you, please call us in 2 weeks if you have not heard from the specialist office to schedule.

## 2023-01-06 ENCOUNTER — Other Ambulatory Visit: Payer: Self-pay | Admitting: Internal Medicine

## 2023-01-14 DIAGNOSIS — Z96651 Presence of right artificial knee joint: Secondary | ICD-10-CM | POA: Diagnosis not present

## 2023-01-14 DIAGNOSIS — Z471 Aftercare following joint replacement surgery: Secondary | ICD-10-CM | POA: Diagnosis not present

## 2023-04-07 ENCOUNTER — Ambulatory Visit
Admission: RE | Admit: 2023-04-07 | Discharge: 2023-04-07 | Disposition: A | Discharge status: Home / Self Care | Payer: Medicare Other | Source: Ambulatory Visit

## 2023-04-07 ENCOUNTER — Ambulatory Visit: Payer: Medicare Other

## 2023-04-07 ENCOUNTER — Other Ambulatory Visit: Payer: Self-pay | Admitting: Family Medicine

## 2023-04-07 ENCOUNTER — Ambulatory Visit
Admission: RE | Admit: 2023-04-07 | Discharge: 2023-04-07 | Disposition: A | Discharge status: Home / Self Care | Payer: Medicare Other | Source: Ambulatory Visit | Attending: Family Medicine | Admitting: Family Medicine

## 2023-04-07 DIAGNOSIS — N6310 Unspecified lump in the right breast, unspecified quadrant: Secondary | ICD-10-CM

## 2023-04-07 DIAGNOSIS — N6313 Unspecified lump in the right breast, lower outer quadrant: Secondary | ICD-10-CM | POA: Diagnosis not present

## 2023-05-13 NOTE — Progress Notes (Signed)
 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 MAIC, GERD, complicated by history renal cell CA PFT- 2008-  CT chest [facility] 12/13/13- Ground glass, tree-in-bud, and micronodular opacities in a bronchovascular pattern involving the right upper lobe are favored to be related to an infectious process. Recommend repeat chest CT after appropriate therapy to document resolution. CT chest 03/15/16 No evidence of interstitial lung disease or mycobacterium aviumcomplex. Scattered pulmonary nodules. 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 MAIC, GERD/ LPR, complicated by history renal cell CA, HTN, hx Covid infection 2022,  -Wixela, Proair  hfa, neb albuterol ,  Covid vax- 5 Phizer Flu vax- had -----Pt states she fell in a parking lot last week and now has pain under left breast also has a lingering cough she thinks she had bronchitis over a month ago Recent bronchitis for which we sent Z-Pak and prednisone  taper January 23.  The prednisone  made her "hyper".  Residual cough has been slow to clear but mostly nonproductive now. 1 week ago she slipped on gravel and fell, striking left anterior chest wall.  She describes brief LOC and sustained left periorbital bruising, pleuritic pain under left breast, and abrasion on left knee and left ankle.  She went to urgent care.  Still having pleuritic pain left anterior chest wall, focal to fingertip pressure.  Her residual cough aggravates this. Insurance changed Advair  to [employer] and Ventolin  to ProAir -discussed. CXR 10/13/21- IMPRESSION: Normal exam.  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 MAIC, GERD/ LPR, complicated by history renal cell  CA, HTN, hx Covid infection 2022,  -Wixela 100, Proair  hfa, neb albuterol ,  CXR 05/13/22 MPRESSION: No active cardiopulmonary disease. Discussed the use of AI scribe software for clinical note transcription with the patient, who gave verbal consent to proceed.  History of Present Illness   The patient, with a history of bronchitis, sinusitis, and asthma, presents for a routine follow-up. She reports a history of frequent exacerbations requiring nebulizer treatments, but has been well this winter. She has been using Wixela, a substitute for Advair , and believes it is effective as she has not had any significant infections this winter. She also reports taking Singulair , which was prescribed by another practitioner due to elevated eosinophils and suspected allergies. She is unsure of its effectiveness but is hesitant to discontinue it. The patient is a retired Insurance underwriter and has been under the doctor's care for several years. given permission to try off Singulair  if she wants.     ROS-see HPI   + = positive Constitutional:   No-   weight loss, night sweats, fevers, chills, fatigue, lassitude. HEENT:   +headaches, difficulty swallowing, tooth/dental problems, sore throat,       No-  sneezing, itching,  ear ache, nasal congestion, post nasal drip,  CV:  + chest pain, orthopnea, PND, swelling in lower extremities, anasarca, dizziness, palpitations Resp: +shortness of breath with exertion or at rest.             No-productive cough,   non-productive cough,  No- coughing up of blood.              -change in color of mucus. No- wheezing.   Skin: No-   rash or lesions. GI:  + heartburn, indigestion, No-abdominal pain, nausea, vomiting,  GU: . MS:  +joint pain or  swelling.   Neuro-     nothing unusual Psych:  No- change in mood or affect. No depression or anxiety.  No memory loss.  OBJ- Physical Exam General- Alert, Oriented, Affect-appropriate, Distress- none acute. + Overweight. Skin- rash-none,  lesions- none, excoriation- none  +L periorbital bruising,  Lymphadenopathy- none Head- atraumatic            Eyes- Gross vision intact, PERRLA, conjunctivae and secretions clear            Ears- clear            Nose- no- turbinate edema, no-Septal dev, mucus, polyps, erosion, perforation,            Throat- Mallampati II , mucosa-red , drainage+, tonsils- atrophic,   Neck- flexible , trachea midline, no stridor , thyroid  nl, carotid no bruit Chest - symmetrical excursion , unlabored           Heart/CV- RRR , no murmur , no gallop  , no rub, nl s1 s2                           - JVD- none , edema- none, stasis changes- none, varices- none           Lung- +clear unlabored, wheeze- none, cough- none, dullness-none, rub- none           Chest wall- +point tender under L breast Abd-  Br/ Gen/ Rectal- Not done, not indicated Extrem- +scab L knee and L ankle Neuro- grossly intact to observation  Assessment and Plan    Asthma Stable with no significant infections this winter. Currently on Wixela 100/50 and Singulair . Elevated eosinophils noted, suggesting a potential allergic component. Discussed the potential use of biologics if control worsens, but currently not indicated. -Continue Wixela 100/50 and Singulair . -Refill Wixela 100/50 prescription for 3 months via [facility]. -Consider trial off Singulair  to assess its efficacy, but patient may continue if preferred.  Follow-up in 1 year or sooner if control worsens.

## 2023-05-16 ENCOUNTER — Ambulatory Visit: Payer: Medicare Other | Admitting: Internal Medicine

## 2023-05-16 ENCOUNTER — Encounter: Payer: Self-pay | Admitting: Internal Medicine

## 2023-05-16 VITALS — BP 124/65 | HR 71 | Temp 98.5°F | Resp 18 | Ht 61.0 in | Wt 157.2 lb

## 2023-05-16 DIAGNOSIS — J4489 Other specified chronic obstructive pulmonary disease: Secondary | ICD-10-CM

## 2023-05-16 MED ORDER — FLUTICASONE-SALMETEROL 100-50 MCG/ACT IN AEPB: INHALATION_SPRAY | RESPIRATORY_TRACT | 4 refills | Status: AC

## 2023-05-16 NOTE — Patient Instructions (Signed)
 Wixela refilled  Glad you are doing well

## 2023-05-30 ENCOUNTER — Other Ambulatory Visit (HOSPITAL_BASED_OUTPATIENT_CLINIC_OR_DEPARTMENT_OTHER): Payer: Self-pay

## 2023-05-30 ENCOUNTER — Encounter: Payer: Self-pay | Admitting: Internal Medicine

## 2023-05-31 ENCOUNTER — Encounter (HOSPITAL_COMMUNITY)
Admission: EM | Disposition: A | Discharge status: Home / Self Care | Payer: Self-pay | Source: Home / Self Care | Attending: Internal Medicine

## 2023-05-31 ENCOUNTER — Encounter (HOSPITAL_COMMUNITY): Payer: Self-pay | Admitting: Internal Medicine

## 2023-05-31 ENCOUNTER — Inpatient Hospital Stay (HOSPITAL_COMMUNITY)
Admission: EM | Admit: 2023-05-31 | Discharge: 2023-06-01 | DRG: 280 | Disposition: A | Discharge status: Home / Self Care | Payer: Medicare Other | Attending: Internal Medicine | Admitting: Internal Medicine

## 2023-05-31 ENCOUNTER — Inpatient Hospital Stay (HOSPITAL_COMMUNITY): Payer: Medicare Other

## 2023-05-31 ENCOUNTER — Other Ambulatory Visit: Payer: Self-pay

## 2023-05-31 DIAGNOSIS — Z8616 Personal history of COVID-19: Secondary | ICD-10-CM | POA: Diagnosis not present

## 2023-05-31 DIAGNOSIS — I214 Non-ST elevation (NSTEMI) myocardial infarction: Secondary | ICD-10-CM | POA: Diagnosis not present

## 2023-05-31 DIAGNOSIS — Z7902 Long term (current) use of antithrombotics/antiplatelets: Secondary | ICD-10-CM

## 2023-05-31 DIAGNOSIS — R079 Chest pain, unspecified: Secondary | ICD-10-CM | POA: Diagnosis not present

## 2023-05-31 DIAGNOSIS — I213 ST elevation (STEMI) myocardial infarction of unspecified site: Secondary | ICD-10-CM | POA: Diagnosis not present

## 2023-05-31 DIAGNOSIS — Z85528 Personal history of other malignant neoplasm of kidney: Secondary | ICD-10-CM

## 2023-05-31 DIAGNOSIS — Z905 Acquired absence of kidney: Secondary | ICD-10-CM | POA: Diagnosis not present

## 2023-05-31 DIAGNOSIS — K760 Fatty (change of) liver, not elsewhere classified: Secondary | ICD-10-CM | POA: Diagnosis not present

## 2023-05-31 DIAGNOSIS — Z8719 Personal history of other diseases of the digestive system: Secondary | ICD-10-CM

## 2023-05-31 DIAGNOSIS — Z8261 Family history of arthritis: Secondary | ICD-10-CM

## 2023-05-31 DIAGNOSIS — I472 Ventricular tachycardia, unspecified: Secondary | ICD-10-CM | POA: Diagnosis present

## 2023-05-31 DIAGNOSIS — F419 Anxiety disorder, unspecified: Secondary | ICD-10-CM | POA: Diagnosis present

## 2023-05-31 DIAGNOSIS — I2510 Atherosclerotic heart disease of native coronary artery without angina pectoris: Secondary | ICD-10-CM | POA: Diagnosis not present

## 2023-05-31 DIAGNOSIS — R9431 Abnormal electrocardiogram [ECG] [EKG]: Secondary | ICD-10-CM | POA: Diagnosis not present

## 2023-05-31 DIAGNOSIS — Z820 Family history of epilepsy and other diseases of the nervous system: Secondary | ICD-10-CM | POA: Diagnosis not present

## 2023-05-31 DIAGNOSIS — I700 Atherosclerosis of aorta: Secondary | ICD-10-CM | POA: Diagnosis present

## 2023-05-31 DIAGNOSIS — F32A Depression, unspecified: Secondary | ICD-10-CM | POA: Diagnosis not present

## 2023-05-31 DIAGNOSIS — Z9089 Acquired absence of other organs: Secondary | ICD-10-CM

## 2023-05-31 DIAGNOSIS — Z79899 Other long term (current) drug therapy: Secondary | ICD-10-CM

## 2023-05-31 DIAGNOSIS — I10 Essential (primary) hypertension: Secondary | ICD-10-CM | POA: Diagnosis not present

## 2023-05-31 DIAGNOSIS — J4489 Other specified chronic obstructive pulmonary disease: Secondary | ICD-10-CM | POA: Diagnosis present

## 2023-05-31 DIAGNOSIS — I493 Ventricular premature depolarization: Secondary | ICD-10-CM | POA: Diagnosis present

## 2023-05-31 DIAGNOSIS — E785 Hyperlipidemia, unspecified: Secondary | ICD-10-CM | POA: Diagnosis not present

## 2023-05-31 DIAGNOSIS — Z7982 Long term (current) use of aspirin: Secondary | ICD-10-CM

## 2023-05-31 DIAGNOSIS — Z8249 Family history of ischemic heart disease and other diseases of the circulatory system: Secondary | ICD-10-CM | POA: Diagnosis not present

## 2023-05-31 DIAGNOSIS — Z7985 Long-term (current) use of injectable non-insulin antidiabetic drugs: Secondary | ICD-10-CM

## 2023-05-31 DIAGNOSIS — F909 Attention-deficit hyperactivity disorder, unspecified type: Secondary | ICD-10-CM | POA: Diagnosis not present

## 2023-05-31 DIAGNOSIS — Z9049 Acquired absence of other specified parts of digestive tract: Secondary | ICD-10-CM

## 2023-05-31 DIAGNOSIS — Z825 Family history of asthma and other chronic lower respiratory diseases: Secondary | ICD-10-CM

## 2023-05-31 DIAGNOSIS — Z860100 Personal history of colon polyps, unspecified: Secondary | ICD-10-CM

## 2023-05-31 DIAGNOSIS — I3139 Other pericardial effusion (noninflammatory): Secondary | ICD-10-CM | POA: Clinically undetermined

## 2023-05-31 DIAGNOSIS — Z96653 Presence of artificial knee joint, bilateral: Secondary | ICD-10-CM | POA: Diagnosis not present

## 2023-05-31 DIAGNOSIS — I2119 ST elevation (STEMI) myocardial infarction involving other coronary artery of inferior wall: Secondary | ICD-10-CM | POA: Diagnosis not present

## 2023-05-31 DIAGNOSIS — I110 Hypertensive heart disease with heart failure: Secondary | ICD-10-CM | POA: Diagnosis present

## 2023-05-31 DIAGNOSIS — I200 Unstable angina: Principal | ICD-10-CM | POA: Diagnosis present

## 2023-05-31 DIAGNOSIS — I5031 Acute diastolic (congestive) heart failure: Secondary | ICD-10-CM | POA: Diagnosis not present

## 2023-05-31 DIAGNOSIS — I252 Old myocardial infarction: Secondary | ICD-10-CM | POA: Diagnosis present

## 2023-05-31 DIAGNOSIS — M170 Bilateral primary osteoarthritis of knee: Secondary | ICD-10-CM | POA: Diagnosis present

## 2023-05-31 DIAGNOSIS — I25110 Atherosclerotic heart disease of native coronary artery with unstable angina pectoris: Secondary | ICD-10-CM | POA: Diagnosis not present

## 2023-05-31 DIAGNOSIS — I499 Cardiac arrhythmia, unspecified: Secondary | ICD-10-CM | POA: Diagnosis not present

## 2023-05-31 DIAGNOSIS — E782 Mixed hyperlipidemia: Secondary | ICD-10-CM | POA: Diagnosis not present

## 2023-05-31 DIAGNOSIS — R918 Other nonspecific abnormal finding of lung field: Secondary | ICD-10-CM | POA: Diagnosis not present

## 2023-05-31 DIAGNOSIS — Z8619 Personal history of other infectious and parasitic diseases: Secondary | ICD-10-CM

## 2023-05-31 DIAGNOSIS — Z7951 Long term (current) use of inhaled steroids: Secondary | ICD-10-CM

## 2023-05-31 DIAGNOSIS — K219 Gastro-esophageal reflux disease without esophagitis: Secondary | ICD-10-CM | POA: Diagnosis present

## 2023-05-31 DIAGNOSIS — Z87442 Personal history of urinary calculi: Secondary | ICD-10-CM

## 2023-05-31 HISTORY — PX: LEFT HEART CATH AND CORONARY ANGIOGRAPHY: CATH118249

## 2023-05-31 LAB — CBC WITH DIFFERENTIAL/PLATELET
Abs Immature Granulocytes: 0.02 10*3/uL (ref 0.00–0.07)
Basophils Absolute: 0 10*3/uL (ref 0.0–0.1)
Basophils Relative: 1 %
Eosinophils Absolute: 0.1 10*3/uL (ref 0.0–0.5)
Eosinophils Relative: 3 %
HCT: 38 % (ref 36.0–46.0)
Hemoglobin: 12.3 g/dL (ref 12.0–15.0)
Immature Granulocytes: 0 %
Lymphocytes Relative: 25 %
Lymphs Abs: 1.2 10*3/uL (ref 0.7–4.0)
MCH: 28.9 pg (ref 26.0–34.0)
MCHC: 32.4 g/dL (ref 30.0–36.0)
MCV: 89.2 fL (ref 80.0–100.0)
Monocytes Absolute: 0.3 10*3/uL (ref 0.1–1.0)
Monocytes Relative: 6 %
Neutro Abs: 3.3 10*3/uL (ref 1.7–7.7)
Neutrophils Relative %: 65 %
Platelets: 361 10*3/uL (ref 150–400)
RBC: 4.26 MIL/uL (ref 3.87–5.11)
RDW: 14.6 % (ref 11.5–15.5)
WBC: 4.9 10*3/uL (ref 4.0–10.5)
nRBC: 0 % (ref 0.0–0.2)

## 2023-05-31 LAB — ECHOCARDIOGRAM COMPLETE
Area-P 1/2: 3.29 cm2
Calc EF: 60.8 %
Height: 61 in
S' Lateral: 2.7 cm
Single Plane A2C EF: 55.8 %
Single Plane A4C EF: 63.7 %
Weight: 2433.6 [oz_av]

## 2023-05-31 LAB — COMPREHENSIVE METABOLIC PANEL
ALT: 34 U/L (ref 0–44)
AST: 29 U/L (ref 15–41)
Albumin: 2.9 g/dL — ABNORMAL LOW (ref 3.5–5.0)
Alkaline Phosphatase: 80 U/L (ref 38–126)
Anion gap: 9 (ref 5–15)
BUN: 9 mg/dL (ref 8–23)
CO2: 21 mmol/L — ABNORMAL LOW (ref 22–32)
Calcium: 9.2 mg/dL (ref 8.9–10.3)
Chloride: 100 mmol/L (ref 98–111)
Creatinine, Ser: 0.7 mg/dL (ref 0.44–1.00)
GFR, Estimated: >60 mL/min (ref >60)
Glucose, Bld: 106 mg/dL — ABNORMAL HIGH (ref 70–99)
Potassium: 3.4 mmol/L — ABNORMAL LOW (ref 3.5–5.1)
Sodium: 130 mmol/L — ABNORMAL LOW (ref 135–145)
Total Bilirubin: 0.5 mg/dL (ref 0.0–1.2)
Total Protein: 6.8 g/dL (ref 6.5–8.1)

## 2023-05-31 LAB — POCT I-STAT, CHEM 8
BUN: 9 mg/dL (ref 8–23)
Calcium, Ion: 1.2 mmol/L (ref 1.15–1.40)
Chloride: 102 mmol/L (ref 98–111)
Creatinine, Ser: 0.7 mg/dL (ref 0.44–1.00)
Glucose, Bld: 105 mg/dL — ABNORMAL HIGH (ref 70–99)
HCT: 40 % (ref 36.0–46.0)
Hemoglobin: 13.6 g/dL (ref 12.0–15.0)
Potassium: 3.6 mmol/L (ref 3.5–5.1)
Sodium: 137 mmol/L (ref 135–145)
TCO2: 22 mmol/L (ref 22–32)

## 2023-05-31 LAB — PROTIME-INR
INR: 1.7 — ABNORMAL HIGH (ref 0.8–1.2)
Prothrombin Time: 19.7 s — ABNORMAL HIGH (ref 11.4–15.2)

## 2023-05-31 LAB — LIPID PANEL
Cholesterol: 161 mg/dL (ref 0–200)
HDL: 45 mg/dL (ref >40)
LDL Cholesterol: 106 mg/dL — ABNORMAL HIGH (ref 0–99)
Total CHOL/HDL Ratio: 3.6 {ratio}
Triglycerides: 51 mg/dL (ref <150)
VLDL: 10 mg/dL (ref 0–40)

## 2023-05-31 LAB — LACTIC ACID, PLASMA: Lactic Acid, Venous: 1.6 mmol/L (ref 0.5–1.9)

## 2023-05-31 LAB — MRSA NEXT GEN BY PCR, NASAL: MRSA by PCR Next Gen: NOT DETECTED

## 2023-05-31 LAB — APTT: aPTT: >200 s (ref 24–36)

## 2023-05-31 LAB — TROPONIN I (HIGH SENSITIVITY)
Troponin I (High Sensitivity): 800 ng/L (ref <18)
Troponin I (High Sensitivity): 8061 ng/L (ref <18)

## 2023-05-31 LAB — CG4 I-STAT (LACTIC ACID): Lactic Acid, Venous: 0.6 mmol/L (ref 0.5–1.9)

## 2023-05-31 SURGERY — LEFT HEART CATH AND CORONARY ANGIOGRAPHY
Anesthesia: LOCAL

## 2023-05-31 MED ORDER — CLOPIDOGREL BISULFATE 75 MG PO TABS: 300.0000 mg | ORAL_TABLET | Freq: Once | ORAL | Status: DC

## 2023-05-31 MED ORDER — MIDAZOLAM HCL 2 MG/2ML IJ SOLN
INTRAMUSCULAR | Status: AC
  Filled 2023-05-31: qty 2

## 2023-05-31 MED ORDER — ALPRAZOLAM 0.5 MG PO TABS
0.5000 mg | ORAL_TABLET | Freq: Every evening | ORAL | Status: DC | PRN
  Administered 2023-05-31: 0.5 mg via ORAL
  Filled 2023-05-31: qty 1

## 2023-05-31 MED ORDER — HEPARIN SODIUM (PORCINE) 1000 UNIT/ML IJ SOLN
INTRAMUSCULAR | Status: AC
  Filled 2023-05-31: qty 10

## 2023-05-31 MED ORDER — ASPIRIN 81 MG PO CHEW
81.0000 mg | CHEWABLE_TABLET | Freq: Every day | ORAL | Status: DC
  Administered 2023-06-01: 81 mg via ORAL
  Filled 2023-05-31: qty 1

## 2023-05-31 MED ORDER — LIDOCAINE HCL (PF) 1 % IJ SOLN
INTRAMUSCULAR | Status: DC | PRN
  Administered 2023-05-31: 2 mL

## 2023-05-31 MED ORDER — SODIUM CHLORIDE 0.9 % IV SOLN: 250.0000 mL | INTRAVENOUS | Status: AC | PRN

## 2023-05-31 MED ORDER — SODIUM CHLORIDE 0.9% FLUSH
3.0000 mL | Freq: Two times a day (BID) | INTRAVENOUS | Status: DC
  Administered 2023-05-31 – 2023-06-01 (×2): 3 mL via INTRAVENOUS

## 2023-05-31 MED ORDER — HEPARIN SODIUM (PORCINE) 1000 UNIT/ML IJ SOLN
INTRAMUSCULAR | Status: DC | PRN
  Administered 2023-05-31: 5000 [IU] via INTRA_ARTERIAL

## 2023-05-31 MED ORDER — ACETAMINOPHEN 325 MG PO TABS: 650.0000 mg | ORAL_TABLET | ORAL | Status: DC | PRN

## 2023-05-31 MED ORDER — SODIUM CHLORIDE 0.9% FLUSH: 3.0000 mL | INTRAVENOUS | Status: DC | PRN

## 2023-05-31 MED ORDER — VERAPAMIL HCL 2.5 MG/ML IV SOLN
INTRAVENOUS | Status: DC | PRN
  Administered 2023-05-31: 10 mL via INTRA_ARTERIAL

## 2023-05-31 MED ORDER — FLUOXETINE HCL 20 MG PO CAPS: 20.0000 mg | ORAL_CAPSULE | Freq: Every day | ORAL | Status: DC

## 2023-05-31 MED ORDER — ONDANSETRON HCL 4 MG/2ML IJ SOLN: 4.0000 mg | Freq: Four times a day (QID) | INTRAMUSCULAR | Status: DC | PRN

## 2023-05-31 MED ORDER — HEPARIN (PORCINE) IN NACL 1000-0.9 UT/500ML-% IV SOLN
INTRAVENOUS | Status: DC | PRN
  Administered 2023-05-31 (×2): 500 mL

## 2023-05-31 MED ORDER — HYDRALAZINE HCL 20 MG/ML IJ SOLN: 10.0000 mg | INTRAMUSCULAR | Status: AC | PRN

## 2023-05-31 MED ORDER — OLANZAPINE-FLUOXETINE HCL 3-25 MG PO CAPS: 1.0000 | ORAL_CAPSULE | Freq: Every day | ORAL | Status: DC

## 2023-05-31 MED ORDER — OLANZAPINE 2.5 MG PO TABS
2.5000 mg | ORAL_TABLET | Freq: Every day | ORAL | Status: DC
  Administered 2023-05-31: 2.5 mg via ORAL
  Filled 2023-05-31 (×2): qty 1

## 2023-05-31 MED ORDER — FENTANYL CITRATE (PF) 100 MCG/2ML IJ SOLN
INTRAMUSCULAR | Status: AC
  Filled 2023-05-31: qty 2

## 2023-05-31 MED ORDER — FENTANYL CITRATE (PF) 100 MCG/2ML IJ SOLN
INTRAMUSCULAR | Status: DC | PRN
  Administered 2023-05-31 (×2): 25 ug via INTRAVENOUS

## 2023-05-31 MED ORDER — LABETALOL HCL 5 MG/ML IV SOLN: 10.0000 mg | INTRAVENOUS | Status: AC | PRN

## 2023-05-31 MED ORDER — CLOPIDOGREL BISULFATE 75 MG PO TABS
75.0000 mg | ORAL_TABLET | Freq: Every day | ORAL | Status: DC
  Administered 2023-06-01: 75 mg via ORAL
  Filled 2023-05-31: qty 1

## 2023-05-31 MED ORDER — CLOPIDOGREL BISULFATE 75 MG PO TABS
300.0000 mg | ORAL_TABLET | Freq: Once | ORAL | Status: AC
  Administered 2023-05-31: 300 mg via ORAL
  Filled 2023-05-31: qty 4

## 2023-05-31 MED ORDER — IOHEXOL 350 MG/ML SOLN
INTRAVENOUS | Status: DC | PRN
Start: 2023-05-31 — End: 2023-05-31
  Administered 2023-05-31: 30 mL

## 2023-05-31 MED ORDER — LIDOCAINE HCL (PF) 1 % IJ SOLN
INTRAMUSCULAR | Status: AC
  Filled 2023-05-31: qty 30

## 2023-05-31 MED ORDER — FUROSEMIDE 10 MG/ML IJ SOLN
INTRAMUSCULAR | Status: DC | PRN
  Administered 2023-05-31: 10 mg via INTRAVENOUS

## 2023-05-31 MED ORDER — FLUOXETINE HCL 20 MG PO CAPS
40.0000 mg | ORAL_CAPSULE | Freq: Every day | ORAL | Status: DC
  Administered 2023-05-31: 40 mg via ORAL
  Filled 2023-05-31: qty 2

## 2023-05-31 MED ORDER — VERAPAMIL HCL 2.5 MG/ML IV SOLN
INTRAVENOUS | Status: AC
  Filled 2023-05-31: qty 2

## 2023-05-31 MED ORDER — MIDAZOLAM HCL 2 MG/2ML IJ SOLN
INTRAMUSCULAR | Status: DC | PRN
  Administered 2023-05-31 (×2): 1 mg via INTRAVENOUS

## 2023-05-31 MED ORDER — FUROSEMIDE 10 MG/ML IJ SOLN
INTRAMUSCULAR | Status: AC
  Filled 2023-05-31: qty 4

## 2023-05-31 MED ORDER — ESCITALOPRAM OXALATE 10 MG PO TABS: 20.0000 mg | ORAL_TABLET | Freq: Every day | ORAL | Status: DC

## 2023-05-31 SURGICAL SUPPLY — 13 items
CATH INFINITI 5 FR JL3.5 (CATHETERS) IMPLANT
CATH INFINITI 5FR ANG PIGTAIL (CATHETERS) IMPLANT
CATH VISTA GUIDE 6FR JR4 ECOPK (CATHETERS) IMPLANT
DEVICE RAD COMP TR BAND LRG (VASCULAR PRODUCTS) IMPLANT
ELECT DEFIB PAD ADLT CADENCE (PAD) IMPLANT
GLIDESHEATH SLEND SS 6F .021 (SHEATH) IMPLANT
GUIDEWIRE INQWIRE 1.5J.035X260 (WIRE) IMPLANT
INQWIRE 1.5J .035X260CM (WIRE) ×1 IMPLANT
KIT ENCORE 26 ADVANTAGE (KITS) IMPLANT
KIT HEMO VALVE WATCHDOG (MISCELLANEOUS) IMPLANT
KIT SINGLE USE MANIFOLD (KITS) IMPLANT
PACK CARDIAC CATHETERIZATION (CUSTOM PROCEDURE TRAY) ×2 IMPLANT
SET ATX-X65L (MISCELLANEOUS) IMPLANT

## 2023-05-31 NOTE — Progress Notes (Signed)
  Echocardiogram 2D Echocardiogram has been performed.  [name] 05/31/2023, 3:59 PM

## 2023-05-31 NOTE — Progress Notes (Signed)
 This chaplain responded to Code Stemi. The chaplain understands from ED update, Pt. is in [facility]. Family is not present.  This chaplain is available for F/U spiritual care as needed.  Chaplain Stephanie Acre [phone]

## 2023-05-31 NOTE — Plan of Care (Signed)

## 2023-05-31 NOTE — H&P (Addendum)
 Cardiology Admission History and Physical   Patient ID: [name] MRN: [id number]; DOB: [birth year]   Admission date: 05/31/2023  PCP:  Natalia Leatherwood, DO   [facility] Providers Cardiologist:  Orbie Pyo, MD     Chief Complaint:  STEMI  Patient Profile:   [name] is a 63 y.o. female with hypertension, renal cell carcinoma s/p partial left nephrectomy, s/p ablation to right renal tumor x2, asthma who is being seen 05/31/2023 for the evaluation of chest pain/STEMI.  History of Present Illness:   [name] is a 63 year old female with past medical history noted above.  She is followed by [facility] oncology given her history of renal cell carcinoma initially diagnosed in 2005 and underwent left partial nephrectomy, right renal cryo ablation x 2.  Recently seen in the pulmonologist office 2/10 for follow-up.  Stable symptoms at that time and was continued on Wixela 110/50, and singulair.   Reports she was in her usual state of health up until today.  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  On EMS arrival EKG showed sinus rhythm, slight ST elevation in inferior leads with reciprocal depression in V1 V2.  She was given aspirin and sublingual nitro via EMS.  Subsequent EKG showed improvement in ST elevation and chest pain that resolved prior to arrival.  She was taken emergently to the Cath Lab for further evaluation with cardiac catheterization.   Past Medical History:  Diagnosis Date   Anxiety    psychiatrist: Dr. Lafayette Dragon    Arthritis    Asthma    Bronchitis    Colitis    Colon polyps    COVID-19 09/2020   Depression    psychiatrist: Dr. Lafayette Dragon   Elevated liver enzymes    Dr. Isla Pence following   Essential hypertension, benign 03/05/2015   Fatty liver    GERD (gastroesophageal reflux disease)    Heart murmur    History of chicken pox    History of kidney stones     Hypertension    Multiple pulmonary nodules determined by computed tomography of lung 03/07/2014   Last CT suggested clearance of tree-in-bud.  Follows with Dr. Joni Fears     Chest CT [place] 2015-micronodules, groundglass, tree in bud right upper lobe  CT chest 03/15/16 No evidence of interstitial lung disease or mycobacterium aviumcomplex. Scattered pulmonary nodules. No f/u needed- low risk   Osteoarthritis of right knee 07/29/2021   Postmenopausal bleeding 06/27/2020   Primary osteoarthritis of both knees 04/09/2011   Formatting of this note might be different from the original. Overview:  S/p left total knee replacement   Renal cell carcinoma    hx of left and right; Dr. Laurelyn Sickle   Trochanteric bursitis, left hip 09/27/2018    Past Surgical History:  Procedure Laterality Date   CHOLECYSTECTOMY     FETAL RADIO FREQUENCY ABLATION     FRACTURE SURGERY Left    ankle   JOINT REPLACEMENT Left 2009   knee   NEPHRECTOMY     partial LT   OPEN SURGICAL REPAIR OF GLUTEAL TENDON Left 09/27/2018   Procedure: Left hip bursectomy; gluteal tendon repair;  Surgeon: Ollen Gross, MD;  Location: [facility];  Service: Orthopedics;  Laterality: Left;    TONSILLECTOMY     TOTAL KNEE ARTHROPLASTY Right 12/21/2021   Procedure: TOTAL KNEE ARTHROPLASTY;  Surgeon: Ollen Gross, MD;  Location: [facility];  Service: Orthopedics;  Laterality: Right;     Medications Prior to Admission: Prior to Admission medications   Medication Sig Start Date End Date Taking? Authorizing Provider  albuterol (PROVENTIL) (2.5 MG/3ML) 0.083% nebulizer solution Take 3 mLs (2.5 mg total) by nebulization every 6 (six) hours as needed for wheezing or shortness of breath. 12/02/21   Jetty Duhamel D, MD  albuterol (VENTOLIN HFA) 108 (90 Base) MCG/ACT inhaler USE 1 TO 2 INHALATIONS EVERY 6 HOURS AS NEEDED FOR SHORTNESS OF BREATH 01/07/23   Young, Rennis Chris, MD  ALPRAZolam Prudy Feeler) 0.5 MG tablet Take 0.5 mg by mouth 2 (two) times daily as  needed for anxiety.    [provider]  Cholecalciferol (VITAMIN D-3 PO) Take 1 tablet by mouth in the morning.    [provider]  fluticasone-salmeterol [facility] INHUB) 100-50 MCG/ACT AEPB Inhale 1 puff then rinse mouth, once daily 05/16/23   Jetty Duhamel D, MD  MAGNESIUM PO Take 1 tablet by mouth in the morning.    [provider]  montelukast (SINGULAIR) 10 MG tablet Take 1 tablet (10 mg total) by mouth at bedtime. 06/18/22   Jetty Duhamel D, MD  OLANZapine-FLUoxetine (SYMBYAX) 3-25 MG capsule Take 1 capsule by mouth at bedtime.    [provider]  PSYLLIUM HUSK PO Take 2 capsules by mouth in the morning and at bedtime. 10/12/20   [provider]  Red Yeast Rice Extract (RED YEAST RICE PO) Take 2 capsules by mouth in the morning. [place]net CholesteRice Supplement (Red yeast rice/Plant Sterols/Pantethine/CoQ10/Policosanol) 10/12/20   [provider]  Semaglutide-Weight Management (WEGOVY) 2.4 MG/0.75ML SOAJ Inject 2.4 mg into the skin once a week. 12/28/22   Kuneff, Renee A, DO     Allergies:   No Known Allergies  Social History:   Social History   Socioeconomic History   Marital status: Married    Spouse name: Not on file   Number of children: Not on file   Years of education: Not on file   Highest education level: Bachelor's degree (e.g., BA, AB, BS)  Occupational History   Not on file  Tobacco Use   Smoking status: Never    Passive exposure: Never   Smokeless tobacco: Never  Vaping Use   Vaping status: Never Used  Substance and Sexual Activity   Alcohol use: No   Drug use: No   Sexual activity: Yes  Other Topics Concern   Not on file  Social History Narrative   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   Lives with her husband, son and granddaughter.   Drinks caffeinated beverages.   Wears her seatbelt, wears a bicycle helmet, there is a smoke detector in home. There are no  firearms in the home.   Patient feels safe in her relationships.   Social Drivers of Corporate investment banker Strain: Low Risk  (11/17/2022)   Overall Financial Resource Strain (CARDIA)    Difficulty of Paying Living Expenses: Not hard at all  Food Insecurity: No Food Insecurity (11/17/2022)   Hunger Vital Sign    Worried About Running Out of Food in the Last Year: Never true    Ran Out of Food in the Last Year: Never true  Transportation Needs: No Transportation Needs (11/17/2022)   PRAPARE - Administrator, Civil Service (Medical): No    Lack of Transportation (Non-Medical): No  Physical Activity: Insufficiently Active (11/17/2022)   Exercise Vital Sign  Days of Exercise per Week: 3 days    Minutes of Exercise per Session: 40 min  Stress: No Stress Concern Present (11/17/2022)   [employer] of Occupational Health - Occupational Stress Questionnaire    Feeling of Stress : Not at all  Social Connections: Socially Integrated (11/17/2022)   Social Connection and Isolation Panel [NHANES]    Frequency of Communication with Friends and Family: More than three times a week    Frequency of Social Gatherings with Friends and Family: More than three times a week    Attends Religious Services: More than 4 times per year    Active Member of [employer] or Organizations: Yes    Attends Banker Meetings: 1 to 4 times per year    Marital Status: Married  Catering manager Violence: Not At Risk (11/17/2022)   Humiliation, Afraid, Rape, and Kick questionnaire    Fear of Current or Ex-Partner: No    Emotionally Abused: No    Physically Abused: No    Sexually Abused: No    Family History:   The patient's family history includes Alzheimer's disease in her father and mother; Arthritis in her father and mother; COPD in her father; Heart disease in her father; Hypertension in her father, mother, and sister.    ROS:  Please see the history of present illness.  All other ROS  reviewed and negative.     Physical Exam/Data:   Vitals:   05/31/23 1149  SpO2: 99%   No intake or output data in the 24 hours ending 05/31/23 1211    05/16/2023    9:30 AM 12/28/2022   10:29 AM 11/19/2022   10:16 AM  Last 3 Weights  Weight (lbs) 157 lb 3.2 oz 172 lb 6.4 oz 177 lb 12.8 oz  Weight (kg) 71.305 kg 78.2 kg 80.65 kg     There is no height or weight on file to calculate BMI.  General:  Well nourished, well developed, in no acute distress HEENT: normal Neck: no JVD Vascular: No carotid bruits; Distal pulses 2+ bilaterally   Cardiac:  normal S1, S2; RRR; no murmur  Lungs:  clear to auscultation bilaterally, no wheezing, rhonchi or rales  Abd: soft, nontender, no hepatomegaly  Ext: no edema Musculoskeletal:  No deformities, BUE and BLE strength normal and equal Skin: warm and dry  Neuro:  CNs 2-12 intact, no focal abnormalities noted Psych:  Normal affect    EKG:  The ECG that was done 2/25 was personally reviewed and demonstrates sinus rhythm, 70 bpm with ST elevation in inferior leads.  Relevant CV Studies:  N/a   Laboratory Data:  High Sensitivity Troponin:  No results for input(s): "TROPONINIHS" in the last 720 hours.    ChemistryNo results for input(s): "NA", "K", "CL", "CO2", "GLUCOSE", "BUN", "CREATININE", "CALCIUM", "MG", "GFRNONAA", "GFRAA", "ANIONGAP" in the last 168 hours.  No results for input(s): "PROT", "ALBUMIN", "AST", "ALT", "ALKPHOS", "BILITOT" in the last 168 hours. Lipids No results for input(s): "CHOL", "TRIG", "HDL", "LABVLDL", "LDLCALC", "CHOLHDL" in the last 168 hours. HematologyNo results for input(s): "WBC", "RBC", "HGB", "HCT", "MCV", "MCH", "MCHC", "RDW", "PLT" in the last 168 hours. Thyroid No results for input(s): "TSH", "FREET4" in the last 168 hours. BNPNo results for input(s): "BNP", "PROBNP" in the last 168 hours.  DDimer No results for input(s): "DDIMER" in the last 168 hours.   Radiology/Studies:  No results  found.   Assessment and Plan:   [name] is a 63 y.o. female with hypertension,  renal cell carcinoma s/p partial left nephrectomy, s/p ablation to right renal tumor x2, asthma who is being seen 05/31/2023 for the evaluation of chest pain/STEMI.  STEMI -- Developed centralized chest pain to getting out of the shower to get ready.  Initial EKG via EMS showed ST elevation in inferior leads.  She was given aspirin and sublingual nitroglycerin with EMS.  Was pain-free on arrival to the Cath Lab with improvement in ST changes.  Lab for emergent cardiac catheterization. -- The recommendations based off findings, anticipate routine post MI care  Hypertension -- Noted in history, not appear to be on any medications PTA.  Med reconciliation completed  Renal cell carcinoma status post left partial nephrectomy, R cryoablation x2 '05 -- Follows through [facility] oncology  Risk Assessment/Risk Scores:   TIMI Risk Score for ST  Elevation MI:   The patient's TIMI risk score is 1, which indicates a 1.6% risk of all cause mortality at 30 days.  Code Status: Full Code  Severity of Illness: The appropriate patient status for this patient is INPATIENT. Inpatient status is judged to be reasonable and necessary in order to provide the required intensity of service to ensure the patient's safety. The patient's presenting symptoms, physical exam findings, and initial radiographic and laboratory data in the context of their chronic comorbidities is felt to place them at high risk for further clinical deterioration. Furthermore, it is not anticipated that the patient will be medically stable for discharge from the hospital within 2 midnights of admission.   * I certify that at the point of admission it is my clinical judgment that the patient will require inpatient hospital care spanning beyond 2 midnights from the point of admission due to high intensity of service, high risk for further deterioration and  high frequency of surveillance required.*   For questions or updates, please contact [facility] Please consult [email or web address] for contact info under   Signed, Laverda Page, NP  05/31/2023 12:11 PM   ATTENDING ATTESTATION:  After conducting a review of all available clinical information with the care team, interviewing the patient, and performing a physical exam, I agree with the findings and plan described in this note.   GEN: No acute distress.   HEENT:  MMM, no JVD, no scleral icterus Cardiac: RRR, no murmurs, rubs, or gallops.  Respiratory: Clear to auscultation bilaterally. GI: Soft, nontender, non-distended  MS: No edema; No deformity. Neuro:  Nonfocal  Vasc:  +2 radial pulses  63 y.o. female with hypertension, renal cell carcinoma s/p partial left nephrectomy, s/p ablation to right renal tumor x2, asthma who is being seen 05/31/2023 for the evaluation of chest pain.  The patient developed acute onset chest pain earlier this morning.  Due to unrelenting symptoms she called EMS.  An EKG demonstrated inferior ST elevations however after sublingual nitroglycerin x 2 her EKG changes have resolved.  Due to a concerning episode of angina with dynamic ST changes will refer for emergency coronary angiography.  Further recommendations will be informed by the results of this test.  Alverda Skeans, MD Pager [phone]

## 2023-06-01 ENCOUNTER — Other Ambulatory Visit (HOSPITAL_COMMUNITY): Payer: Self-pay

## 2023-06-01 ENCOUNTER — Encounter (HOSPITAL_COMMUNITY): Payer: Self-pay | Admitting: Internal Medicine

## 2023-06-01 ENCOUNTER — Telehealth: Payer: Self-pay | Admitting: Cardiology

## 2023-06-01 DIAGNOSIS — I700 Atherosclerosis of aorta: Secondary | ICD-10-CM | POA: Diagnosis not present

## 2023-06-01 DIAGNOSIS — I2119 ST elevation (STEMI) myocardial infarction involving other coronary artery of inferior wall: Secondary | ICD-10-CM | POA: Diagnosis not present

## 2023-06-01 DIAGNOSIS — I3139 Other pericardial effusion (noninflammatory): Secondary | ICD-10-CM | POA: Clinically undetermined

## 2023-06-01 DIAGNOSIS — E782 Mixed hyperlipidemia: Secondary | ICD-10-CM

## 2023-06-01 DIAGNOSIS — I25110 Atherosclerotic heart disease of native coronary artery with unstable angina pectoris: Secondary | ICD-10-CM | POA: Diagnosis present

## 2023-06-01 HISTORY — DX: Other pericardial effusion (noninflammatory): I31.39

## 2023-06-01 LAB — HEMOGLOBIN A1C
Hgb A1c MFr Bld: 5.1 % (ref 4.8–5.6)
Mean Plasma Glucose: 100 mg/dL

## 2023-06-01 MED ORDER — CLOPIDOGREL BISULFATE 75 MG PO TABS
75.0000 mg | ORAL_TABLET | Freq: Every day | ORAL | 2 refills | Status: DC
  Filled 2023-06-01: qty 30, 30d supply, fill #0

## 2023-06-01 MED ORDER — METOPROLOL SUCCINATE ER 25 MG PO TB24
25.0000 mg | ORAL_TABLET | Freq: Every day | ORAL | 1 refills | Status: DC
  Filled 2023-06-01: qty 30, 30d supply, fill #0

## 2023-06-01 MED ORDER — ROSUVASTATIN CALCIUM 20 MG PO TABS
40.0000 mg | ORAL_TABLET | Freq: Every day | ORAL | Status: DC
  Administered 2023-06-01: 40 mg via ORAL
  Filled 2023-06-01: qty 2

## 2023-06-01 MED ORDER — NITROGLYCERIN 0.4 MG SL SUBL
0.4000 mg | SUBLINGUAL_TABLET | SUBLINGUAL | 2 refills | Status: DC | PRN
  Filled 2023-06-01: qty 25, 8d supply, fill #0

## 2023-06-01 MED ORDER — ASPIRIN 81 MG PO CHEW
81.0000 mg | CHEWABLE_TABLET | Freq: Every day | ORAL | 2 refills | Status: DC
  Filled 2023-06-01: qty 30, 30d supply, fill #0

## 2023-06-01 MED ORDER — ROSUVASTATIN CALCIUM 40 MG PO TABS
40.0000 mg | ORAL_TABLET | Freq: Every day | ORAL | 1 refills | Status: DC
  Filled 2023-06-01: qty 30, 30d supply, fill #0

## 2023-06-01 NOTE — Progress Notes (Signed)
 CARDIAC REHAB PHASE I   PRE:  Rate/Rhythm: 74 SR    BP: sitting 101/59    SpO2: 96 RA  MODE:  Ambulation: 440 ft   POST:  Rate/Rhythm: 96 SR, couplet PVC    BP: sitting 118/67     SpO2:   Tolerated ambulation well, no major c/o. BP stable.  Discussed with pt and husband MI, restrictions, diet, exercise, NTG and CRPII. Pt receptive. Will refer to [place] CRPII, eager to do program.  [id number]  Ethelda Chick BS, ACSM-CEP 06/01/2023 10:54 AM

## 2023-06-01 NOTE — Telephone Encounter (Signed)
   Transition of Care Follow-up Phone Call Request    Patient Name: Markita Stcharles Date of Birth: Apr 19, 1960 Date of Encounter: 06/01/2023  Primary Care Provider:  Natalia Leatherwood, DO Primary Cardiologist:  Orbie Pyo, MD  [name] has been scheduled for a transition of care follow up appointment with a HeartCare provider:  Jari Favre 3/10   Please reach out to [name] within 48 hours of discharge to confirm appointment and review transition of care protocol questionnaire. Anticipated discharge date: 2/26  Laverda Page, NP  06/01/2023, 12:39 PM

## 2023-06-01 NOTE — Progress Notes (Addendum)
 Patient Name: [name] Date of Encounter: 06/01/2023 [facility] Cardiologist: Orbie Pyo, MD-new  Patient profile    [name] is a 63 year old female with history of HTN as well as RCCA s/p left nephrectomy and ablation to right renal tumor x 2 (followed by [facility] oncology) as well as asthma who presented on 05/31/2023 for evaluation of chest pain and possible inferior STEMI. She developed centralized chest discomfort while getting ready to go to meeting after being in the shower.  Initial EMS EKG demonstrated slight inferior ST elevations with reciprocal depressions V1 and V2.  Some improvement in symptoms with nitroglycerin.  Notable improvement in EKG on arrival to [facility].  Taken emergently to catheterization lab.  Interval Summary  .    Feels well this morning.  No major issues no further chest tightness.  Just feels a little bit tired  Assessment & Plan .     Principal Problem:   ST elevation myocardial infarction (STEMI) of inferior wall (HCC) Active Problems:   Coronary artery disease involving native coronary artery of native heart with unstable angina pectoris (HCC)   Hyperlipidemia   Morbid obesity (HCC)   Aortic atherosclerosis (HCC)   Pericardial effusion  Likely aborted inferior STEMI- based on EKG and troponin elevation with culprit being distal PDA lesion, not amenable to PCI; elevated LVEDP consistent with acute diastolic heart failure in the setting of MI With troponin elevation, he was loaded on clopidogrel with plans for DAPT x 1 year Relatively stable blood pressures, will add low-dose Toprol based on occasional PVCs and short run of NSVT on telemetry overnight Hyperlipidemia with goal LDL< 55: High-dose statin (rosuvastatin 40 mg daily ordered) -> important based on minimal CAD but also aortic plaque noted on echocardiogram Small pericardial effusion noted-monitor for signs of Dressler's. Cardiac rehab phase 1 and phase 2  to consult placed Was on Wegovy in the outpatient setting, okay to continue in the outpatient setting. Was on Adderall prior to admission, recommend holding for 3 weeks post MI  Dispo: Hemodynamically stable with minimal CAD besides the distal cut off of the RPDA.  Preserved EF.  Work with Cardiac Rehab today and consider potential discharge  =================================================================== Vital Signs .    Vitals:   05/31/23 2335 06/01/23 0410 06/01/23 0714 06/01/23 1030  BP: 121/72 (!) 114/58 115/61 118/67  Pulse: (!) 54 62 84 85  Resp: 13 18 17 18   Temp: 98 F (36.7 C) 98.4 F (36.9 C) 98.4 F (36.9 C) 98.1 F (36.7 C)  TempSrc: Oral Oral Oral Oral  SpO2: 96% 97% 96% 98%  Weight:      Height:        Intake/Output Summary (Last 24 hours) at 06/01/2023 1502 Last data filed at 06/01/2023 0840 Gross per 24 hour  Intake 720 ml  Output --  Net 720 ml      05/31/2023    1:00 PM 05/16/2023    9:30 AM 12/28/2022   10:29 AM  Last 3 Weights  Weight (lbs) 152 lb 1.6 oz 157 lb 3.2 oz 172 lb 6.4 oz  Weight (kg) 68.992 kg 71.305 kg 78.2 kg     Lab Results  Component Value Date   CHOL 161 05/31/2023   HDL 45 05/31/2023   LDLCALC 106 (H) 05/31/2023   LDLDIRECT 131.0 12/15/2020   TRIG 51 05/31/2023   CHOLHDL 3.6 05/31/2023   Physical Exam .    GEN: No acute distress.   Neck: No JVD  Cardiac: RRR, normal S1 and S2.  No murmurs, rubs, or gallops.  Respiratory: Clear to auscultation bilaterally. GI: Soft, nontender, non-distended  MS: No edema; radial cath site clean dry and intact  Telemetry/ECG    Sinus rhythm with occasional PVCs and 1 short 5-6 beat run of NSVT- Personally Reviewed  EKG 06/01/2023: Normal sinus rhythm-rate 71 bpm; Left axis deviation (axis -43); Poor R wave progression (cited on or before 01-Jun-2023); Non-specific ST-t changes  Echocardiogram (06/28/2023): EF 55 to 60%.  Severe hypokinesis of the basal inferior wall.  GR 1 DD.  Unable to  assess RVP.  Small pericardial effusion near RV.  Protruding calcified plaque noted in descending thoracic aorta. Cardiac Cath 11/28/2023: No high-grade proximal occlusions with possible distal RPDA 100%. Elevated LVEDP of 33 mmHg mid inferior wall motion abnormality. => No PCI.  Plan was DAPT x 1 year if troponin elevated.  Given 10 mg of IV Lasix.  (Films personally reviewed)   For questions or updates, please contact [facility] Please consult [email or web address] for contact info under        Signed, Bryan Lemma, MD

## 2023-06-01 NOTE — Discharge Summary (Addendum)
 Discharge Summary    Patient ID: [name] MRN: [id number]; DOB: 12/15/1960  Admit date: 05/31/2023 Discharge date: 06/01/2023  PCP:  Natalia Leatherwood, DO   [facility] Providers Cardiologist:  Orbie Pyo, MD     Discharge Diagnoses    Principal Problem:   ST elevation myocardial infarction (STEMI) of inferior wall [facility]) Active Problems:   Coronary artery disease involving native coronary artery of native heart with unstable angina pectoris (HCC)   Hyperlipidemia   Morbid obesity (HCC)   Aortic atherosclerosis (HCC)   Pericardial effusion  Diagnostic Studies/Procedures    Cath: 05/31/2023    RPDA lesion is 100% stenosed.   1.  No high-grade proximal occlusions with possible vessel cutoff of distal PDA. 2.  Highly elevated LVEDP of 32 mmHg with preserved LVEF with mid inferior wall motion abnormality.   Recommendation: Patient was administered 10 mg of IV Lasix given that she is Lasix nave.  Will follow troponins.  If positive will treat with dual antiplatelet therapy with Plavix for 1 year given distal vessel cutoff/embolization.  Monitoring the to see unit.  The case was reviewed with Dr. Elwyn Lade and Herbie Baltimore.  Diagnostic Dominance: Right   Echocardiogram (06/28/2023): EF 55 to 60%.  Severe hypokinesis of the basal inferior wall.  GR 1 DD.  Unable to assess RVP.  Small pericardial effusion near RV.  Protruding calcified plaque noted in descending thoracic aorta. _____________   History of Present Illness     [name] is a 63 y.o. female with hypertension, renal cell carcinoma s/p partial left nephrectomy, s/p ablation to right renal tumor x2, asthma who was seen 05/31/2023 for the evaluation of chest pain/STEMI.   She is followed by [facility] oncology given her history of renal cell carcinoma initially diagnosed in 2005 and underwent left partial nephrectomy, right renal cryo ablation x 2.  Recently seen in the pulmonologist office 2/10 for  follow-up.  Stable symptoms at that time and was continued on Wixela 110/50, and singulair.    Reported she was in her usual state of health up until the day of admission.  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  On EMS arrival EKG showed sinus rhythm, slight ST elevation in inferior leads with reciprocal depression in V1 V2.  She was given aspirin and sublingual nitro via EMS.  Subsequent EKG showed improvement in ST elevation and chest pain that resolved prior to arrival.  She was taken emergently to the Cath Lab for further evaluation with cardiac catheterization.  Hospital Course     STEMI -- Underwent cardiac catheterization 2/25 notable for culprit lesion being distal PDA not amendable to PCI. High-sensitivity troponin peaked at 8061. Placed on DAPT with aspirin/Plavix recommendations for at least 1 year. She had rapid improvement while admitted, despite presenting with STEMI. -- Seen by cardiac rehab -- Continue aspirin, Plavix, Crestor 40 mg daily, metoprolol XL 25 mg daily  Acute HFpEF -- Elevated LVEDP, status post IV Lasix in the setting of acute MI -- Echo showed LVEF of 55 to 60%, basal inferior hypokinesis, grade 1 diastolic dysfunction, small pericardial effusion, no significant valvular disease  Hyperlipidemia -- LDL 106, HDL 45, LP(a) pending -- Crestor 40 mg daily -- Will need LFT/FLP in 8 weeks  ADHD -- On Adderall prior to admission, will recommend holding 3 weeks post MI  PVC NSVT -- started on Toprol XL 25mg   daily   Patient seen by Dr. Herbie Baltimore and deemed stable for discharge home.  Follow-up arranged in the office.  Medication sent to [facility] pharmacy.  Educated by [employer].D. prior to discharge.  Did the patient have an acute coronary syndrome (MI, NSTEMI, STEMI, etc) this admission?:  Yes                               AHA/ACC ACS Clinical Performance & Quality Measures: Aspirin prescribed?  - Yes ADP Receptor Inhibitor (Plavix/Clopidogrel, Brilinta/Ticagrelor or Effient/Prasugrel) prescribed (includes medically managed patients)? - Yes Beta Blocker prescribed? - Yes High Intensity Statin (Lipitor 40-80mg  or Crestor 20-40mg ) prescribed? - Yes EF assessed during THIS hospitalization? - Yes For EF <40%, was ACEI/ARB prescribed? - Not Applicable (EF >/= 40%) For EF <40%, Aldosterone Antagonist (Spironolactone or Eplerenone) prescribed? - Not Applicable (EF >/= 40%) Cardiac Rehab Phase II ordered (including medically managed patients)? - Yes   The patient will be scheduled for a TOC follow up appointment in 10-14 days.  A message has been sent to the [facility] and Scheduling Pool at the office where the patient should be seen for follow up.  _____________  Discharge Vitals Blood pressure 118/67, pulse 85, temperature 98.1 F (36.7 C), temperature source Oral, resp. rate 18, height 5\' 1"  (1.549 m), weight 69 kg, last menstrual period 04/03/2013, SpO2 98%.  Filed Weights   05/31/23 1300  Weight: 69 kg    Labs & Radiologic Studies    CBC Recent Labs    05/31/23 1201 05/31/23 1220  WBC 4.9  --   NEUTROABS 3.3  --   HGB 12.3 13.6  HCT 38.0 40.0  MCV 89.2  --   PLT 361  --    Basic Metabolic Panel Recent Labs    96/04/54 1201 05/31/23 1220  NA 130* 137  K 3.4* 3.6  CL 100 102  CO2 21*  --   GLUCOSE 106* 105*  BUN 9 9  CREATININE 0.70 0.70  CALCIUM 9.2  --    Liver Function Tests Recent Labs    05/31/23 1201  AST 29  ALT 34  ALKPHOS 80  BILITOT 0.5  PROT 6.8  ALBUMIN 2.9*   No results for input(s): "LIPASE", "AMYLASE" in the last 72 hours. High Sensitivity Troponin:   Recent Labs  Lab 05/31/23 1201 05/31/23 1451  TROPONINIHS 800* 8,061*    BNP Invalid input(s): "POCBNP" D-Dimer No results for input(s): "DDIMER" in the last 72 hours. Hemoglobin A1C Recent Labs    05/31/23 1201  HGBA1C 5.1   Fasting Lipid Panel Recent Labs     05/31/23 1201  CHOL 161  HDL 45  LDLCALC 106*  TRIG 51  CHOLHDL 3.6   Thyroid Function Tests No results for input(s): "TSH", "T4TOTAL", "T3FREE", "THYROIDAB" in the last 72 hours.  Invalid input(s): "FREET3" _____________   CARDIAC CATHETERIZATION Result Date: 05/31/2023   RPDA lesion is 100% stenosed. 1.  No high-grade proximal occlusions with possible vessel cutoff of distal PDA. 2.  Highly elevated LVEDP of 32 mmHg with preserved LVEF with mid inferior wall motion abnormality. Recommendation: Patient was administered 10 mg of IV Lasix given that she is Lasix nave.  Will follow troponins.  If positive will treat with dual antiplatelet therapy with Plavix for 1 year given distal vessel cutoff/embolization.  Monitoring the to see unit.  The case was reviewed with Dr. Elwyn Lade and Herbie Baltimore.   Disposition   Pt  is being discharged home today in good condition.  Follow-up Plans & Appointments     Follow-up Information     Kuneff, Renee A, DO Follow up in 2 week(s).   Specialty: Family Medicine Why: Hospital follow up Contact information: [address] [place] [address] [phone]                Discharge Instructions     Amb Referral to Cardiac Rehabilitation   Complete by: As directed    Diagnosis: NSTEMI   After initial evaluation and assessments completed: Virtual Based Care may be provided alone or in conjunction with Phase 2 Cardiac Rehab based on patient barriers.: Yes   Intensive Cardiac Rehabilitation (ICR) [facility] location only OR Traditional Cardiac Rehabilitation (TCR) *If criteria for ICR are not met will enroll in TCR [facility] only): Yes   Call MD for:  redness, tenderness, or signs of infection (pain, swelling, redness, odor or green/yellow discharge around incision site)   Complete by: As directed    Diet - low sodium heart healthy   Complete by: As directed    Discharge instructions   Complete by: As directed    Radial Site Care Refer to this sheet in  the next few weeks. These instructions provide you with information on caring for yourself after your procedure. Your caregiver may also give you more specific instructions. Your treatment has been planned according to current medical practices, but problems sometimes occur. Call your caregiver if you have any problems or questions after your procedure. HOME CARE INSTRUCTIONS You may shower the day after the procedure. Remove the bandage (dressing) and gently wash the site with plain soap and water. Gently pat the site dry.  Do not apply powder or lotion to the site.  Do not submerge the affected site in water for 3 to 5 days.  Inspect the site at least twice daily.  Do not flex or bend the affected arm for 24 hours.  No lifting over 5 pounds (2.3 kg) for 5 days after your procedure.  Do not drive home if you are discharged the same day of the procedure. Have someone else drive you.  You may drive 24 hours after the procedure unless otherwise instructed by your caregiver.  What to expect: Any bruising will usually fade within 1 to 2 weeks.  Blood that collects in the tissue (hematoma) may be painful to the touch. It should usually decrease in size and tenderness within 1 to 2 weeks.  SEEK IMMEDIATE MEDICAL CARE IF: You have unusual pain at the radial site.  You have redness, warmth, swelling, or pain at the radial site.  You have drainage (other than a small amount of blood on the dressing).  You have chills.  You have a fever or persistent symptoms for more than 72 hours.  You have a fever and your symptoms suddenly get worse.  Your arm becomes pale, cool, tingly, or numb.  You have heavy bleeding from the site. Hold pressure on the site.   PLEASE DO NOT MISS ANY DOSES OF YOUR PLAVIX!!!!! Also keep a log of you blood pressures and bring back to your follow up appt. Please call the office with any questions.   Patients taking blood thinners should generally stay away from medicines like  ibuprofen, Advil, Motrin, naproxen, and Aleve due to risk of stomach bleeding. You may take Tylenol as directed or talk to your primary doctor about alternatives.  Some studies suggest Prilosec/Omeprazole interacts with Plavix. We  changed your Prilosec/Omeprazole to the equivalent dose of Protonix for less chance of interaction.  PLEASE ENSURE THAT YOU DO NOT RUN OUT OF YOUR PLAVIX. This medication is very important to remain on for at least one year. IF you have issues obtaining this medication due to cost please CALL the office 3-5 business days prior to running out in order to prevent missing doses of this medication.   Increase activity slowly   Complete by: As directed         Discharge Medications   Allergies as of 06/01/2023   No Known Allergies      Medication List     STOP taking these medications    amphetamine-dextroamphetamine 20 MG 24 hr capsule Commonly known as: ADDERALL XR   RED YEAST RICE PO       TAKE these medications    albuterol (2.5 MG/3ML) 0.083% nebulizer solution Commonly known as: PROVENTIL Take 3 mLs (2.5 mg total) by nebulization every 6 (six) hours as needed for wheezing or shortness of breath.   albuterol 108 (90 Base) MCG/ACT inhaler Commonly known as: VENTOLIN HFA USE 1 TO 2 INHALATIONS EVERY 6 HOURS AS NEEDED FOR SHORTNESS OF BREATH   ALPRAZolam 0.5 MG tablet Commonly known as: XANAX Take 0.5 mg by mouth at bedtime.   Aspirin Low Dose 81 MG chewable tablet Generic drug: aspirin Chew 1 tablet (81 mg total) by mouth daily. Start taking on: June 02, 2023   clopidogrel 75 MG tablet Commonly known as: PLAVIX Take 1 tablet (75 mg total) by mouth daily with breakfast. Start taking on: June 02, 2023   FLUoxetine 20 MG capsule Commonly known as: PROZAC Take 20 mg by mouth at bedtime.   fluticasone-salmeterol 100-50 MCG/ACT Aepb Commonly known as: Wixela Inhub Inhale 1 puff then rinse mouth, once daily What changed:  how  much to take how to take this when to take this additional instructions   MAGNESIUM PO Take 1 tablet by mouth in the morning.   metoprolol succinate 25 MG 24 hr tablet Commonly known as: Toprol XL Take 1 tablet (25 mg total) by mouth daily.   nitroGLYCERIN 0.4 MG SL tablet Commonly known as: NITROSTAT Place 1 tablet (0.4 mg total) under the tongue every 5 (five) minutes as needed.   OLANZapine-FLUoxetine 3-25 MG capsule Commonly known as: SYMBYAX Take 1 capsule by mouth at bedtime.   PSYLLIUM HUSK PO Take 2 capsules by mouth in the morning and at bedtime.   rosuvastatin 40 MG tablet Commonly known as: CRESTOR Take 1 tablet (40 mg total) by mouth daily. Start taking on: June 02, 2023   VITAMIN D-3 PO Take 1 tablet by mouth in the morning.   Wegovy 2.4 MG/0.75ML Soaj Generic drug: Semaglutide-Weight Management Inject 2.4 mg into the skin once a week.         Outstanding Labs/Studies   FLP/LFTs in 8 weeks  Duration of Discharge Encounter: APP Time: 20 minutes   Signed, Laverda Page, NP 06/01/2023, 2:25 PM   ATTENDING ATTESTATION  I have seen, examined and evaluated the patient this AM on rounds along with Laverda Page, NP-C.  After reviewing all the available data and chart, we discussed the patients laboratory, study & physical findings as well as symptoms in detail.  I agree with her findings, examination as well as hospital summary with impressions and recommendations as per our discussion.    Attending adjustments noted in italics.   Principal Problem:   ST elevation myocardial infarction (STEMI)  of inferior wall (HCC) Active Problems:   Coronary artery disease involving native coronary artery of native heart with unstable angina pectoris (HCC)   Hyperlipidemia   Morbid obesity (HCC)   Aortic atherosclerosis (HCC)   Pericardial effusion   Likely aborted inferior STEMI- based on EKG and troponin elevation with culprit being distal PDA lesion,  not amenable to PCI; elevated LVEDP consistent with acute diastolic heart failure in the setting of MI With troponin elevation, he was loaded on clopidogrel with plans for DAPT x 1 year Relatively stable blood pressures, will add low-dose Toprol based on occasional PVCs and short run of NSVT on telemetry overnight Hyperlipidemia with goal LDL< 55: High-dose statin (rosuvastatin 40 mg daily ordered) -> important based on minimal CAD but also aortic plaque noted on echocardiogram Small pericardial effusion noted-monitor for signs of Dressler's. Cardiac rehab phase 1 and phase 2 to consult placed Was on Wegovy in the outpatient setting, okay to continue in the outpatient setting. Was on Adderall prior to admission, recommend holding for 3 weeks post MI   Dispo: Hemodynamically stable with minimal CAD besides the distal cut off of the RPDA.  Preserved EF.  Work with Cardiac Rehab today and consider potential discharge   I spent 50 minutes in the care of [name] today including reviewing labs (2 min), reviewing studies (10 min reviewing Cath films & Echo images), face to face time discussing treatment options (23 min), reviewing records from Admit H&P & Epic chart  (5 min), 10 min dicatating, and documenting in the encounter.   Marykay Lex, MD, MS Bryan Lemma, M.D., M.S. Interventional Cardiologist  [facility]  Pager # [phone] Phone # [phone] [place], [place] [address]

## 2023-06-01 NOTE — Progress Notes (Signed)
 Patient [name] verbalized understanding of dc instructions.All belongings and paperwork given to patient

## 2023-06-01 NOTE — TOC CM/SW Note (Signed)
 Transition of Care [facility]) - Inpatient Brief Assessment   Patient Details  Name: Ariauna Farabee MRN: [id number] Date of Birth: [birth year]  Transition of Care [facility]) CM/SW Contact:    Harriet Masson, RN Phone Number: 06/01/2023, 11:57 AM   Clinical Narrative:  Patient admitted for STEMI. No TOC needs at this time.  Transition of Care Asessment: Insurance and Status: Insurance coverage has been reviewed Patient has primary care physician: Yes Home environment has been reviewed: safe to discharge home when medically stable Prior level of function:: independent Prior/Current Home Services: No current home services Social Drivers of Health Review: SDOH reviewed no interventions necessary Readmission risk has been reviewed: Yes Transition of care needs: no transition of care needs at this time

## 2023-06-01 NOTE — TOC Transition Note (Signed)
 Transition of Care [facility]) - Discharge Note   Patient Details  Name: [name] MRN: [id number] Date of Birth: 1960-11-30  Transition of Care [facility]) CM/SW Contact:  Harriet Masson, RN Phone Number: 06/01/2023, 12:54 PM   Clinical Narrative:    Patient stable to discharge home.  No TOC needs at this time.    Final next level of care: Home/Self Care Barriers to Discharge: Barriers Resolved   Patient Goals and CMS Choice Patient states their goals for this hospitalization and ongoing recovery are:: return home          Discharge Placement                 home      Discharge Plan and Services Additional resources added to the After Visit Summary for                                       Social Drivers of Health (SDOH) Interventions SDOH Screenings   Food Insecurity: No Food Insecurity (05/31/2023)  Housing: Low Risk  (05/31/2023)  Transportation Needs: No Transportation Needs (05/31/2023)  Utilities: Not At Risk (05/31/2023)  Alcohol Screen: Low Risk  (11/14/2021)  Depression (PHQ2-9): Low Risk  (11/17/2022)  Financial Resource Strain: Low Risk  (11/17/2022)  Physical Activity: Insufficiently Active (11/17/2022)  Social Connections: Socially Integrated (11/17/2022)  Stress: No Stress Concern Present (11/17/2022)  Tobacco Use: Low Risk  (06/01/2023)  Health Literacy: Adequate Health Literacy (11/17/2022)     Readmission Risk Interventions    06/01/2023   12:54 PM  Readmission Risk Prevention Plan  Post Dischage Appt Complete  Medication Screening Complete  Transportation Screening Complete

## 2023-06-02 NOTE — Telephone Encounter (Signed)
 Patient contacted regarding discharge from [facility] on 06/01/2023 Patient understands to follow up with provider Jari Favre on 06/13/2023 at 2:45 pm at [facility] Patient understands discharge instructions? Yes Patient understands medications and regiment? Yes Patient understands to bring all medications to this visit? Yes  Ask patient:  Are you enrolled in My Chart Yes

## 2023-06-02 NOTE — Telephone Encounter (Signed)
 Called [facility] and spoke with [facility], she stated that they had everything that was needed for the [facility] and it was delivered on 2/25.  Nothing further needed.

## 2023-06-03 LAB — LIPOPROTEIN A (LPA): Lipoprotein (a): 46.2 nmol/L — ABNORMAL HIGH (ref <75.0)

## 2023-06-13 ENCOUNTER — Ambulatory Visit: Payer: Medicare Other | Attending: Physician Assistant | Admitting: Physician Assistant

## 2023-06-13 ENCOUNTER — Ambulatory Visit (INDEPENDENT_AMBULATORY_CARE_PROVIDER_SITE_OTHER)

## 2023-06-13 ENCOUNTER — Encounter: Payer: Self-pay | Admitting: Physician Assistant

## 2023-06-13 VITALS — BP 128/70 | HR 67 | Ht 61.0 in | Wt 155.6 lb

## 2023-06-13 DIAGNOSIS — E785 Hyperlipidemia, unspecified: Secondary | ICD-10-CM | POA: Insufficient documentation

## 2023-06-13 DIAGNOSIS — F909 Attention-deficit hyperactivity disorder, unspecified type: Secondary | ICD-10-CM | POA: Insufficient documentation

## 2023-06-13 DIAGNOSIS — R002 Palpitations: Secondary | ICD-10-CM | POA: Insufficient documentation

## 2023-06-13 DIAGNOSIS — I10 Essential (primary) hypertension: Secondary | ICD-10-CM | POA: Insufficient documentation

## 2023-06-13 DIAGNOSIS — I2510 Atherosclerotic heart disease of native coronary artery without angina pectoris: Secondary | ICD-10-CM | POA: Diagnosis not present

## 2023-06-13 DIAGNOSIS — I493 Ventricular premature depolarization: Secondary | ICD-10-CM | POA: Diagnosis not present

## 2023-06-13 MED ORDER — NITROGLYCERIN 0.4 MG SL SUBL: 0.4000 mg | SUBLINGUAL_TABLET | SUBLINGUAL | 3 refills | Status: AC | PRN

## 2023-06-13 MED ORDER — METOPROLOL SUCCINATE ER 25 MG PO TB24: 25.0000 mg | ORAL_TABLET | Freq: Every day | ORAL | 3 refills | Status: DC

## 2023-06-13 MED ORDER — ASPIRIN 81 MG PO CHEW: 81.0000 mg | CHEWABLE_TABLET | Freq: Every day | ORAL | 3 refills | Status: AC

## 2023-06-13 MED ORDER — ROSUVASTATIN CALCIUM 40 MG PO TABS: 40.0000 mg | ORAL_TABLET | Freq: Every day | ORAL | 3 refills | Status: DC

## 2023-06-13 MED ORDER — CLOPIDOGREL BISULFATE 75 MG PO TABS: 75.0000 mg | ORAL_TABLET | Freq: Every day | ORAL | 3 refills | Status: DC

## 2023-06-13 NOTE — Patient Instructions (Signed)
 Medication Instructions:  Over the counter Protonix 20 mg for reflux is OK Antihistamines without phenylephrine are OK to take *If you need a refill on your cardiac medications before your next appointment, please call your pharmacy*   Lab Work: FASTING lipid panel in 6 weeks If you have labs (blood work) drawn today and your tests are completely normal, you will receive your results only by: MyChart Message (if you have MyChart) OR A paper copy in the mail If you have any lab test that is abnormal or we need to change your treatment, we will call you to review the results.   Testing/Procedures: Zio Heart Monitor (14 days) Your physician has requested that you wear a Zio heart monitor for 14 days. This will be mailed to your home with instructions on how to apply the monitor and how to return it when finished. Please allow 2 weeks after returning the heart monitor before our office calls you with the results.    Follow-Up: At [facility], you and your health needs are our priority.  As part of our continuing mission to provide you with exceptional heart care, we have created designated Provider Care Teams.  These Care Teams include your primary Cardiologist (physician) and Advanced Practice Providers (APPs -  Physician Assistants and Nurse Practitioners) who all work together to provide you with the care you need, when you need it.  We recommend signing up for the patient portal called "MyChart".  Sign up information is provided on this After Visit Summary.  MyChart is used to connect with patients for Virtual Visits (Telemedicine).  Patients are able to view lab/test results, encounter notes, upcoming appointments, etc.  Non-urgent messages can be sent to your provider as well.   To learn more about what you can do with MyChart, go to [email or web address].    Your next appointment:   3 month(s)  Provider:   Orbie Pyo, MD     Other Instructions  [name]- Long Term  Monitor Instructions  Your physician has requested you wear a ZIO patch monitor for 14 days.  This is a single patch monitor. [employer] supplies one patch monitor per enrollment. Additional stickers are not available. Please do not apply patch if you will be having a Nuclear Stress Test,  Echocardiogram, Cardiac CT, MRI, or Chest Xray during the period you would be wearing the  monitor. The patch cannot be worn during these tests. You cannot remove and re-apply the  ZIO XT patch monitor.  Your ZIO patch monitor will be mailed 3 day USPS to your address on file. It may take 3-5 days  to receive your monitor after you have been enrolled.  Once you have received your monitor, please review the enclosed instructions. Your monitor  has already been registered assigning a specific monitor serial # to you.  Billing and Patient Assistance Program Information  We have supplied [employer] with any of your insurance information on file for billing purposes. [employer] offers a sliding scale Patient Assistance Program for patients that do not have  insurance, or whose insurance does not completely cover the cost of the ZIO monitor.  You must apply for the Patient Assistance Program to qualify for this discounted rate.  To apply, please call [employer] at [phone], select option 4, select option 2, ask to apply for  Patient Assistance Program. [name] will ask your household income, and how many people  are in your household. They will quote your out-of-pocket cost based on that  information.  [employer] will also be able to set up a 63-month, interest-free payment plan if needed.  Applying the monitor   Shave hair from upper left chest.  Hold abrader disc by orange tab. Rub abrader in 40 strokes over the upper left chest as  indicated in your monitor instructions.  Clean area with 4 enclosed alcohol pads. Let dry.  Apply patch as indicated in monitor instructions. Patch will be placed under collarbone on left   side of chest with arrow pointing upward.  Rub patch adhesive wings for 2 minutes. Remove white label marked "1". Remove the white  label marked "2". Rub patch adhesive wings for 2 additional minutes.  While looking in a mirror, press and release button in center of patch. A small green light will  flash 3-4 times. This will be your only indicator that the monitor has been turned on.  Do not shower for the first 24 hours. You may shower after the first 24 hours.  Press the button if you feel a symptom. You will hear a small click. Record Date, Time and  Symptom in the Patient Logbook.  When you are ready to remove the patch, follow instructions on the last 2 pages of Patient  Logbook. Stick patch monitor onto the last page of Patient Logbook.  Place Patient Logbook in the blue and white box. Use locking tab on box and tape box closed  securely. The blue and white box has prepaid postage on it. Please place it in the mailbox as  soon as possible. Your physician should have your test results approximately 7 days after the  monitor has been mailed back to [facility].  Call [facility] Customer Care at [phone] if you have questions regarding  your ZIO XT patch monitor. Call them immediately if you see an orange light blinking on your  monitor.  If your monitor falls off in less than 4 days, contact our Monitor department at [phone].  If your monitor becomes loose or falls off after 4 days call [employer] at [phone] for  suggestions on securing your monitor

## 2023-06-13 NOTE — Progress Notes (Signed)
 Cardiology Office Note:  .   Date:  06/13/2023  ID:  [name], DOB 16-Jan-1961, MRN [id number] PCP: Natalia Leatherwood, DO  [facility] Providers Cardiologist:  Orbie Pyo, MD {  History of Present Illness: .   [name] is a 63 y.o. female with a past medical history significant for hypertension, renal cell carcinoma status post partial left nephrectomy, status post ablation of right renal tumors x 2, asthma who was seen in the ER on 06-Jun-2023 for the evaluation of chest pain/STEMI.  She is now here for follow-up.  Followed by [facility] oncology given her history of renal cell carcinoma initially diagnosed in 2005 and underwent left partial nephrectomy, right renal cryoablation x 2.  Recently seen in the pulmonologist office 2/10 for follow-up.  Stable symptoms at time and was continued on Wixela 110/50 and Singulair.  She was in her usual state of health until she was getting out of the shower to get ready to go to a meeting when she developed central chest pain.  On EMS arrival EKG showed sinus rhythm with slight ST elevation in the inferior leads with reciprocal depression in V1 and V2.  Given aspirin and sublingual nitro via EMS.  Subsequent EKG showed improvement in ST elevation and chest pain that resolved prior to arrival.  Taken emergently to the Cath Lab for further evaluation.  Underwent cardiac catheterization 06-06-2023 notable for culprit lesion being the distal PDA not amendable to PCI.  High-sensitivity troponin peaked at 8061.  Placed on DAPT with aspirin/Plavix recommendation for at least a year.  Was discharged on aspirin, Plavix, Crestor 40 mg daily, metoprolol XL 25 mg daily.  Today, she presents with a history of cardiac issues, recently underwent a cardiac catheterization where a lesion was found on the PDA. The lesion was located distally and could not be stented, leading to a treatment plan of aspirin and Plavix for at least a year. Initially,  the patient experienced fatigue after starting the new medications, but this has since resolved. The patient's blood pressure and heart rate are well-controlled on metoprolol. Recently, the patient has been experiencing a "flip flopping" sensation in the chest, which is a new symptom. The patient also has plaque in the aorta and is interested in cardiac rehab.  Reports no shortness of breath nor dyspnea on exertion. Reports no chest pain, pressure, or tightness. No edema, orthopnea, PND.   Discussed the use of AI scribe software for clinical note transcription with the patient, who gave verbal consent to proceed.   ROS: Pertinent ROS in HPI  Studies Reviewed: .        Cath: 2023-06-06     RPDA lesion is 100% stenosed.   1.  No high-grade proximal occlusions with possible vessel cutoff of distal PDA. 2.  Highly elevated LVEDP of 32 mmHg with preserved LVEF with mid inferior wall motion abnormality.   Recommendation: Patient was administered 10 mg of IV Lasix given that she is Lasix nave.  Will follow troponins.  If positive will treat with dual antiplatelet therapy with Plavix for 1 year given distal vessel cutoff/embolization.  Monitoring the to see unit.  The case was reviewed with Dr. Elwyn Lade and Herbie Baltimore.   Diagnostic Dominance: Right    Echocardiogram (06/28/2023): EF 55 to 60%.  Severe hypokinesis of the basal inferior wall.  GR 1 DD.  Unable to assess RVP.  Small pericardial effusion near RV.  Protruding calcified plaque noted in descending thoracic aorta.  _____________  Physical Exam:   VS:  BP 128/70   Pulse 67   Ht 5\' 1"  (1.549 m)   Wt 155 lb 9.6 oz (70.6 kg)   LMP 04/03/2013   SpO2 97%   BMI 29.40 kg/m    Wt Readings from Last 3 Encounters:  06/13/23 155 lb 9.6 oz (70.6 kg)  05/31/23 152 lb 1.6 oz (69 kg)  05/16/23 157 lb 3.2 oz (71.3 kg)    GEN: Well nourished, well developed in no acute distress NECK: No JVD; No carotid bruits CARDIAC: RRR, no murmurs, rubs,  gallops RESPIRATORY:  Clear to auscultation without rales, wheezing or rhonchi  ABDOMEN: Soft, non-tender, non-distended EXTREMITIES:  No edema; No deformity   ASSESSMENT AND PLAN: .   Coronary Artery Disease Recent cardiac catheterization revealed a distal PDA lesion that could not be stented. Medical therapy initiated with Aspirin and Plavix for at least a year. LDL was 106 prior to starting Crestor, with a goal of less than 55. -Continue Aspirin and Plavix. -Continue Crestor and recheck lipid panel in late April 2025 to assess response.  Hypertension Blood pressure well controlled on Metoprolol 25mg  daily. -Continue Metoprolol 25mg  daily.  Palpitations Reports of occasional "flip-flopping" sensation in the chest. History of PVCs and non-sustained SVTs. -Place a 2-week heart monitor to assess burden of arrhythmias. -If burden is high or symptoms persist, consider referral to electrophysiology.  Allergies Inquired about safe antihistamines. -Can take any antihistamine without phenylephrine (e.g., Claritin, Allegra, Zyrtec).  Cardiac Rehabilitation Discussed benefits of cardiac rehab program. -Refer to cardiac rehab program for evaluation and personalized exercise regimen.      Cardiac Rehabilitation Eligibility Assessment  The patient is ready to start cardiac rehabilitation from a cardiac standpoint.      Dispo: She can follow-up in 3 month with Dr. Lynnette Caffey  Signed, Sharlene Dory, PA-C

## 2023-06-13 NOTE — Progress Notes (Unsigned)
 Applied a 14 day ZIo XT monitor to patient in the office  Thukkani to read

## 2023-06-14 ENCOUNTER — Telehealth (HOSPITAL_COMMUNITY): Payer: Self-pay

## 2023-06-14 NOTE — Telephone Encounter (Signed)
 Pt insurance is active and benefits verified through Medicare A/B. Co-pay $0.00, DED $257.00/$257.00 met, out of pocket $0.00/$0.00 met, co-insurance 20%. No pre-authorization required. Passport, 06/14/23 @ 1:50PM, REF#[id number]   How many CR sessions are covered? (36 visits for TCR, 72 visits for ICR)72 Is this a lifetime maximum or an annual maximum? Lifetime Has the member used any of these services to date? No Is there a time limit (weeks/months) on start of program and/or program completion? No     Will contact patient to see if she is interested in the Cardiac Rehab Program. If interested, patient will need to complete follow up appt. Once completed, patient will be contacted for scheduling upon review by the RN Navigator.

## 2023-06-15 ENCOUNTER — Ambulatory Visit: Payer: Medicare Other | Admitting: Family Medicine

## 2023-06-15 ENCOUNTER — Telehealth (HOSPITAL_COMMUNITY): Payer: Self-pay

## 2023-06-15 NOTE — Telephone Encounter (Signed)
 Called patient to see if she was interested in participating in the Cardiac Rehab Program. Patient will come in for orientation on 06/23/23 at 10:30am and will attend the 11:45 exercise class (start on Wednesday 3/26).   Sent info via [employer].

## 2023-06-23 ENCOUNTER — Encounter (HOSPITAL_COMMUNITY)
Admission: RE | Admit: 2023-06-23 | Discharge: 2023-06-23 | Disposition: A | Discharge status: Home / Self Care | Source: Ambulatory Visit | Attending: Internal Medicine | Admitting: Internal Medicine

## 2023-06-23 VITALS — BP 124/80 | HR 62 | Ht 61.0 in | Wt 157.4 lb

## 2023-06-23 DIAGNOSIS — I213 ST elevation (STEMI) myocardial infarction of unspecified site: Secondary | ICD-10-CM

## 2023-06-23 DIAGNOSIS — Z48812 Encounter for surgical aftercare following surgery on the circulatory system: Secondary | ICD-10-CM | POA: Insufficient documentation

## 2023-06-23 DIAGNOSIS — I214 Non-ST elevation (NSTEMI) myocardial infarction: Secondary | ICD-10-CM | POA: Diagnosis not present

## 2023-06-23 NOTE — Progress Notes (Signed)
 Cardiac Individual Treatment Plan  Patient Details  Name: Terrian Ridlon MRN: [id number] Date of Birth: December 12, 1960 Referring Provider:   Flowsheet Row INTENSIVE CARDIAC REHAB ORIENT from 06/23/2023 in [facility]  Referring Provider Dr. Alverda Skeans, MD       Initial Encounter Date:  Flowsheet Row INTENSIVE CARDIAC REHAB ORIENT from 06/23/2023 in [facility] for [facility], [facility]ascular, [facility] Lung [facility]  Date 06/23/23       Visit Diagnosis: 05/31/23 ST elevation myocardial infarction (STEMI)  Patient's Home Medications on Admission:  Current Outpatient Medications:    albuterol (PROVENTIL) (2.5 MG/3ML) 0.083% nebulizer solution, Take 3 mLs (2.5 mg total) by nebulization every 6 (six) hours as needed for wheezing or shortness of breath., Disp: 75 mL, Rfl: 12   albuterol (VENTOLIN HFA) 108 (90 Base) MCG/ACT inhaler, USE 1 TO 2 INHALATIONS EVERY 6 HOURS AS NEEDED FOR SHORTNESS OF BREATH, Disp: 25.5 g, Rfl: 3   ALPRAZolam (XANAX) 0.5 MG tablet, Take 0.5 mg by mouth at bedtime., Disp: , Rfl:    aspirin 81 MG chewable tablet, Chew 1 tablet (81 mg total) by mouth daily., Disp: 90 tablet, Rfl: 3   Cholecalciferol (VITAMIN D-3 PO), Take 1 tablet by mouth in the morning., Disp: , Rfl:    clopidogrel (PLAVIX) 75 MG tablet, Take 1 tablet (75 mg total) by mouth daily with breakfast., Disp: 90 tablet, Rfl: 3   FLUoxetine (PROZAC) 20 MG capsule, Take 20 mg by mouth at bedtime., Disp: , Rfl:    fluticasone-salmeterol (WIXELA INHUB) 100-50 MCG/ACT AEPB, Inhale 1 puff then rinse mouth, once daily (Patient taking differently: Inhale 1 puff into the lungs 2 (two) times daily.), Disp: 180 each, Rfl: 4   MAGNESIUM PO, Take 1 tablet by mouth in the morning., Disp: , Rfl:    metoprolol succinate (TOPROL XL) 25 MG 24 hr tablet, Take 1 tablet (25 mg total) by mouth daily., Disp: 90 tablet, Rfl: 3   nitroGLYCERIN (NITROSTAT) 0.4 MG SL  tablet, Place 1 tablet (0.4 mg total) under the tongue every 5 (five) minutes as needed., Disp: 25 tablet, Rfl: 3   OLANZapine-FLUoxetine (SYMBYAX) 3-25 MG capsule, Take 1 capsule by mouth at bedtime., Disp: , Rfl:    PSYLLIUM HUSK PO, Take 2 capsules by mouth in the morning and at bedtime., Disp: , Rfl:    rosuvastatin (CRESTOR) 40 MG tablet, Take 1 tablet (40 mg total) by mouth daily., Disp: 90 tablet, Rfl: 3   Semaglutide-Weight Management (WEGOVY) 2.4 MG/0.75ML SOAJ, Inject 2.4 mg into the skin once a week., Disp: 3 mL, Rfl: 5  Past Medical History: Past Medical History:  Diagnosis Date   Anxiety    psychiatrist: Dr. Lafayette Dragon    Arthritis    Asthma    Bronchitis    Colitis    Colon polyps    COVID-19 09/2020   Depression    psychiatrist: Dr. Lafayette Dragon   Elevated liver enzymes    Dr. Isla Pence following   Essential hypertension, benign 03/05/2015   Fatty liver    GERD (gastroesophageal reflux disease)    Heart murmur    History of chicken pox    History of kidney stones    Hypertension    Multiple pulmonary nodules determined by computed tomography of lung 03/07/2014   Last CT suggested clearance of tree-in-bud.  Follows with Dr. Joni Fears     Chest CT [facility] 2015-micronodules, groundglass, tree in bud right upper lobe  CT chest 03/15/16 No evidence of interstitial lung disease or mycobacterium aviumcomplex. Scattered pulmonary nodules. No f/u needed- low risk   Osteoarthritis of right knee 07/29/2021   Postmenopausal bleeding 06/27/2020   Primary osteoarthritis of both knees 04/09/2011   Formatting of this note might be different from the original. Overview:  S/p left total knee replacement   Renal cell carcinoma    hx of left and right; Dr. Laurelyn Sickle   Trochanteric bursitis, left hip 09/27/2018    Tobacco Use: Social History   Tobacco Use  Smoking Status Never   Passive exposure: Never  Smokeless Tobacco Never    Labs: Review Flowsheet  More data exists      Latest Ref Rng &  Units 12/15/2020 06/05/2021 05/07/2022 10/22/2022 05/31/2023  Labs for ITP Cardiac and Pulmonary Rehab  Cholestrol 0 - 200 mg/dL - 161  096  - 045   LDL (calc) 0 - 99 mg/dL - 409  811  - 914   Direct LDL mg/dL 782.9  - - - -  HDL-C >56 mg/dL - 21.30  86.57  - 45   Trlycerides <150 mg/dL - 846.9  629.5  - 51   Hemoglobin A1c 4.8 - 5.6 % - 5.7  6.3  5.2  5.2  5.2  5.2  5.1   TCO2 22 - 32 mmol/L - - - - 22     Details       Multiple values from one day are sorted in reverse-chronological order         Capillary Blood Glucose: No results found for: "GLUCAP"   Exercise Target Goals: Exercise Program Goal: Individual exercise prescription set using results from initial 6 min walk test and THRR while considering  patient's activity barriers and safety.   Exercise Prescription Goal: Initial exercise prescription builds to 30-45 minutes a day of aerobic activity, 2-3 days per week.  Home exercise guidelines will be given to patient during program as part of exercise prescription that the participant will acknowledge.  Activity Barriers & Risk Stratification:  Activity Barriers & Cardiac Risk Stratification - 06/23/23 1518       Activity Barriers & Cardiac Risk Stratification   Activity Barriers Left Knee Replacement;Right Knee Replacement;Joint Problems;Balance Concerns;History of Falls;Back Problems;Neck/Spine Problems;Deconditioning    Cardiac Risk Stratification High             6 Minute Walk:  6 Minute Walk     Row Name 06/23/23 1106         6 Minute Walk   Phase Initial     Distance 1200 feet     Walk Time 0 minutes     # of Rest Breaks 0     MPH 2.3     METS 2.73     RPE 11     Perceived Dyspnea  0     VO2 Peak 9.6     Symptoms No     Resting HR 62 bpm     Resting BP 124/80     Resting Oxygen Saturation  97 %     Exercise Oxygen Saturation  during 6 min walk 99 %     Max Ex. HR 81 bpm     Max Ex. BP 128/84     2 Minute Post BP 120/84               Oxygen Initial Assessment:   Oxygen Re-Evaluation:   Oxygen Discharge (Final Oxygen Re-Evaluation):   Initial Exercise Prescription:  Initial Exercise  Prescription - 06/23/23 1500       Date of Initial Exercise RX and Referring Provider   Date 06/23/23    Referring Provider Dr. Alverda Skeans, MD    Expected Discharge Date 09/14/23      Recumbant Bike   Level 1    RPM 60    Watts 15    Minutes 15    METs 2      NuStep   Level 1    SPM 80    Minutes 15    METs 1.7      Prescription Details   Frequency (times per week) 3    Duration Progress to 30 minutes of continuous aerobic without signs/symptoms of physical distress      Intensity   THRR 40-80% of Max Heartrate 63-126    Ratings of Perceived Exertion 11-13    Perceived Dyspnea 0-4      Progression   Progression Continue progressive overload as per policy without signs/symptoms or physical distress.      Resistance Training   Training Prescription Yes    Weight 2 lbs wts    Reps 10-15             Perform Capillary Blood Glucose checks as needed.  Exercise Prescription Changes:   Exercise Comments:   Exercise Goals and Review:   Exercise Goals     Row Name 06/23/23 1513             Exercise Goals   Increase Physical Activity Yes       Intervention Provide advice, education, support and counseling about physical activity/exercise needs.;Develop an individualized exercise prescription for aerobic and resistive training based on initial evaluation findings, risk stratification, comorbidities and participant's personal goals.       Expected Outcomes Short Term: Attend rehab on a regular basis to increase amount of physical activity.;Long Term: Exercising regularly at least 3-5 days a week.;Long Term: Add in home exercise to make exercise part of routine and to increase amount of physical activity.       Increase Strength and Stamina Yes       Intervention Provide advice, education, support  and counseling about physical activity/exercise needs.;Develop an individualized exercise prescription for aerobic and resistive training based on initial evaluation findings, risk stratification, comorbidities and participant's personal goals.       Expected Outcomes Short Term: Increase workloads from initial exercise prescription for resistance, speed, and METs.;Short Term: Perform resistance training exercises routinely during rehab and add in resistance training at home;Long Term: Improve cardiorespiratory fitness, muscular endurance and strength as measured by increased METs and functional capacity ( )       Able to understand and use rate of perceived exertion (RPE) scale Yes       Intervention Provide education and explanation on how to use RPE scale       Expected Outcomes Short Term: Able to use RPE daily in rehab to express subjective intensity level;Long Term:  Able to use RPE to guide intensity level when exercising independently       Knowledge and understanding of Target Heart Rate Range (THRR) Yes       Intervention Provide education and explanation of THRR including how the numbers were predicted and where they are located for reference       Expected Outcomes Short Term: Able to state/look up THRR;Long Term: Able to use THRR to govern intensity when exercising independently;Short Term: Able to use daily as guideline for intensity in rehab  Understanding of Exercise Prescription Yes       Intervention Provide education, explanation, and written materials on patient's individual exercise prescription       Expected Outcomes Short Term: Able to explain program exercise prescription;Long Term: Able to explain home exercise prescription to exercise independently                Exercise Goals Re-Evaluation :   Discharge Exercise Prescription (Final Exercise Prescription Changes):   Nutrition:  Target Goals: Understanding of nutrition guidelines, daily intake of sodium  1500mg , cholesterol 200mg , calories 30% from fat and 7% or less from saturated fats, daily to have 5 or more servings of fruits and vegetables.  Biometrics:  Pre Biometrics - 06/23/23 1030       Pre Biometrics   Waist Circumference 36.5 inches    Hip Circumference 44 inches    Waist to Hip Ratio 0.83 %    Triceps Skinfold 15 mm    % Body Fat 37.4 %    Grip Strength 23 kg    Flexibility 10 in    Single Leg Stand 11.5 seconds              Nutrition Therapy Plan and Nutrition Goals:   Nutrition Assessments:  MEDIFICTS Score Key: >=70 Need to make dietary changes  40-70 Heart Healthy Diet <= 40 Therapeutic Level Cholesterol Diet    Picture Your Plate Scores: <86 Unhealthy dietary pattern with much room for improvement. 41-50 Dietary pattern unlikely to meet recommendations for good health and room for improvement. 51-60 More healthful dietary pattern, with some room for improvement.  >60 Healthy dietary pattern, although there may be some specific behaviors that could be improved.    Nutrition Goals Re-Evaluation:   Nutrition Goals Re-Evaluation:   Nutrition Goals Discharge (Final Nutrition Goals Re-Evaluation):   Psychosocial: Target Goals: Acknowledge presence or absence of significant depression and/or stress, maximize coping skills, provide positive support system. Participant is able to verbalize types and ability to use techniques and skills needed for reducing stress and depression.  Initial Review & Psychosocial Screening:  Initial Psych Review & Screening - 06/23/23 1604       Initial Review   Current issues with Current Depression;History of Depression;Current Anxiety/Panic;Current Stress Concerns    Source of Stress Concerns Chronic Illness      Family Dynamics   Good Support System? Yes      Barriers   Psychosocial barriers to participate in program The patient should benefit from training in stress management and relaxation.;Psychosocial  barriers identified (see note)      Screening Interventions   Interventions Encouraged to exercise;To provide support and resources with identified psychosocial needs;Provide feedback about the scores to participant;Program counselor consult    Expected Outcomes Short Term goal: Utilizing psychosocial counselor, staff and physician to assist with identification of specific Stressors or current issues interfering with healing process. Setting desired goal for each stressor or current issue identified.;Long Term Goal: Stressors or current issues are controlled or eliminated.;Short Term goal: Identification and review with participant of any Quality of Life or Depression concerns found by scoring the questionnaire.;Long Term goal: The participant improves quality of Life and PHQ9 Scores as seen by post scores and/or verbalization of changes   Nurse aware and will provide counseling resources for pt next week when she comes for exercise.            Quality of Life Scores:  Quality of Life - 06/23/23 1511  Quality of Life   Select Quality of Life      Quality of Life Scores   Health/Function Pre 24 %    Socioeconomic Pre 30 %    Psych/Spiritual Pre 24 %    Family Pre 30 %    GLOBAL Pre 26 %            Scores of 19 and below usually indicate a poorer quality of life in these areas.  A difference of  2-3 points is a clinically meaningful difference.  A difference of 2-3 points in the total score of the Quality of Life Index has been associated with significant improvement in overall quality of life, self-image, physical symptoms, and general health in studies assessing change in quality of life.  PHQ-9: Review Flowsheet  More data exists      06/23/2023 11/17/2022 10/20/2022 07/22/2022 06/24/2022  Depression screen PHQ 2/9  Decreased Interest 0 0 0 0 0  Down, Depressed, Hopeless 1 0 0 1 0  PHQ - 2 Score 1 0 0 1 0  Altered sleeping 0 - - - -  Tired, decreased energy 1 - - - -   Change in appetite 0 - - - -  Feeling bad or failure about yourself  0 - - - -  Trouble concentrating 0 - - - -  Moving slowly or fidgety/restless 0 - - - -  Suicidal thoughts 0 - - - -  PHQ-9 Score 2 - - - -  Difficult doing work/chores Not difficult at all - - - -   Interpretation of Total Score  Total Score Depression Severity:  1-4 = Minimal depression, 5-9 = Mild depression, 10-14 = Moderate depression, 15-19 = Moderately severe depression, 20-27 = Severe depression   Psychosocial Evaluation and Intervention:   Psychosocial Re-Evaluation:   Psychosocial Discharge (Final Psychosocial Re-Evaluation):   Vocational Rehabilitation: Provide vocational rehab assistance to qualifying candidates.   Vocational Rehab Evaluation & Intervention:  Vocational Rehab - 06/23/23 1528       Initial Vocational Rehab Evaluation & Intervention   Assessment shows need for Vocational Rehabilitation No   Pt is retired            Education: Education Goals: Education classes will be provided on a weekly basis, covering required topics. Participant will state understanding/return demonstration of topics presented.     Core Videos: Exercise    Move It!  Clinical staff conducted group or individual video education with verbal and written material and guidebook.  Patient learns the recommended Pritikin exercise program. Exercise with the goal of living a long, healthy life. Some of the health benefits of exercise include controlled diabetes, healthier blood pressure levels, improved cholesterol levels, improved heart and lung capacity, improved sleep, and better body composition. Everyone should speak with their doctor before starting or changing an exercise routine.  Biomechanical Limitations Clinical staff conducted group or individual video education with verbal and written material and guidebook.  Patient learns how biomechanical limitations can impact exercise and how we can mitigate  and possibly overcome limitations to have an impactful and balanced exercise routine.  Body Composition Clinical staff conducted group or individual video education with verbal and written material and guidebook.  Patient learns that body composition (ratio of muscle mass to fat mass) is a key component to assessing overall fitness, rather than body weight alone. Increased fat mass, especially visceral belly fat, can put [place] at increased risk for metabolic syndrome, type 2 diabetes, heart disease, and even  death. It is recommended to combine diet and exercise (cardiovascular and resistance training) to improve your body composition. Seek guidance from your physician and exercise physiologist before implementing an exercise routine.  Exercise Action Plan Clinical staff conducted group or individual video education with verbal and written material and guidebook.  Patient learns the recommended strategies to achieve and enjoy long-term exercise adherence, including variety, self-motivation, self-efficacy, and positive decision making. Benefits of exercise include fitness, good health, weight management, more energy, better sleep, less stress, and overall well-being.  Medical   Heart Disease Risk Reduction Clinical staff conducted group or individual video education with verbal and written material and guidebook.  Patient learns our heart is our most vital organ as it circulates oxygen, nutrients, white blood cells, and hormones throughout the entire body, and carries waste away. Data supports a plant-based eating plan like the Pritikin Program for its effectiveness in slowing progression of and reversing heart disease. The video provides a number of recommendations to address heart disease.   Metabolic Syndrome and Belly Fat  Clinical staff conducted group or individual video education with verbal and written material and guidebook.  Patient learns what metabolic syndrome is, how it leads to heart  disease, and how one can reverse it and keep it from coming back. You have metabolic syndrome if you have 3 of the following 5 criteria: abdominal obesity, high blood pressure, high triglycerides, low HDL cholesterol, and high blood sugar.  Hypertension and Heart Disease Clinical staff conducted group or individual video education with verbal and written material and guidebook.  Patient learns that high blood pressure, or hypertension, is very common in the [place]. Hypertension is largely due to excessive salt intake, but other important risk factors include being overweight, physical inactivity, drinking too much alcohol, smoking, and not eating enough potassium from fruits and vegetables. High blood pressure is a leading risk factor for heart attack, stroke, congestive heart failure, dementia, kidney failure, and premature death. Long-term effects of excessive salt intake include stiffening of the arteries and thickening of heart muscle and organ damage. Recommendations include ways to reduce hypertension and the risk of heart disease.  Diseases of Our Time - Focusing on Diabetes Clinical staff conducted group or individual video education with verbal and written material and guidebook.  Patient learns why the best way to stop diseases of our time is prevention, through food and other lifestyle changes. Medicine (such as prescription pills and surgeries) is often only a Band-Aid on the problem, not a long-term solution. Most common diseases of our time include obesity, type 2 diabetes, hypertension, heart disease, and cancer. The Pritikin Program is recommended and has been proven to help reduce, reverse, and/or prevent the damaging effects of metabolic syndrome.  Nutrition   Overview of the Pritikin Eating Plan  Clinical staff conducted group or individual video education with verbal and written material and guidebook.  Patient learns about the Pritikin Eating Plan for disease risk reduction.  The Pritikin Eating Plan emphasizes a wide variety of unrefined, minimally-processed carbohydrates, like fruits, vegetables, whole grains, and legumes. Go, Caution, and Stop food choices are explained. Plant-based and lean animal proteins are emphasized. Rationale provided for low sodium intake for blood pressure control, low added sugars for blood sugar stabilization, and low added fats and oils for coronary artery disease risk reduction and weight management.  Calorie Density  Clinical staff conducted group or individual video education with verbal and written material and guidebook.  Patient learns about calorie density and  how it impacts the Pritikin Eating Plan. Knowing the characteristics of the food you choose will help you decide whether those foods will lead to weight gain or weight loss, and whether you want to consume more or less of them. Weight loss is usually a side effect of the Pritikin Eating Plan because of its focus on low calorie-dense foods.  Label Reading  Clinical staff conducted group or individual video education with verbal and written material and guidebook.  Patient learns about the Pritikin recommended label reading guidelines and corresponding recommendations regarding calorie density, added sugars, sodium content, and whole grains.  Dining Out - Part 1  Clinical staff conducted group or individual video education with verbal and written material and guidebook.  Patient learns that restaurant meals can be sabotaging because they can be so high in calories, fat, sodium, and/or sugar. Patient learns recommended strategies on how to positively address this and avoid unhealthy pitfalls.  Facts on Fats  Clinical staff conducted group or individual video education with verbal and written material and guidebook.  Patient learns that lifestyle modifications can be just as effective, if not more so, as many medications for lowering your risk of heart disease. A Pritikin lifestyle  can help to reduce your risk of inflammation and atherosclerosis (cholesterol build-up, or plaque, in the artery walls). Lifestyle interventions such as dietary choices and physical activity address the cause of atherosclerosis. A review of the types of fats and their impact on blood cholesterol levels, along with dietary recommendations to reduce fat intake is also included.  Nutrition Action Plan  Clinical staff conducted group or individual video education with verbal and written material and guidebook.  Patient learns how to incorporate Pritikin recommendations into their lifestyle. Recommendations include planning and keeping personal health goals in mind as an important part of their success.  Healthy Mind-Set    Healthy Minds, Bodies, Hearts  Clinical staff conducted group or individual video education with verbal and written material and guidebook.  Patient learns how to identify when they are stressed. Video will discuss the impact of that stress, as well as the many benefits of stress management. Patient will also be introduced to stress management techniques. The way we think, act, and feel has an impact on our hearts.  How Our Thoughts Can Heal Our Hearts  Clinical staff conducted group or individual video education with verbal and written material and guidebook.  Patient learns that negative thoughts can cause depression and anxiety. This can result in negative lifestyle behavior and serious health problems. Cognitive behavioral therapy is an effective method to help control our thoughts in order to change and improve our emotional outlook.  Additional Videos:  Exercise    Improving Performance  Clinical staff conducted group or individual video education with verbal and written material and guidebook.  Patient learns to use a non-linear approach by alternating intensity levels and lengths of time spent exercising to help burn more calories and lose more body fat. Cardiovascular  exercise helps improve heart health, metabolism, hormonal balance, blood sugar control, and recovery from fatigue. Resistance training improves strength, endurance, balance, coordination, reaction time, metabolism, and muscle mass. Flexibility exercise improves circulation, posture, and balance. Seek guidance from your physician and exercise physiologist before implementing an exercise routine and learn your capabilities and proper form for all exercise.  Introduction to Yoga  Clinical staff conducted group or individual video education with verbal and written material and guidebook.  Patient learns about yoga, a discipline of the coming  together of mind, breath, and body. The benefits of yoga include improved flexibility, improved range of motion, better posture and core strength, increased lung function, weight loss, and positive self-image. Yoga's heart health benefits include lowered blood pressure, healthier heart rate, decreased cholesterol and triglyceride levels, improved immune function, and reduced stress. Seek guidance from your physician and exercise physiologist before implementing an exercise routine and learn your capabilities and proper form for all exercise.  Medical   Aging: Enhancing Your Quality of Life  Clinical staff conducted group or individual video education with verbal and written material and guidebook.  Patient learns key strategies and recommendations to stay in good physical health and enhance quality of life, such as prevention strategies, having an advocate, securing a Health Care Proxy and Power of Attorney, and keeping a list of medications and system for tracking them. It also discusses how to avoid risk for bone loss.  Biology of Weight Control  Clinical staff conducted group or individual video education with verbal and written material and guidebook.  Patient learns that weight gain occurs because we consume more calories than we burn (eating more, moving less).  Even if your body weight is normal, you may have higher ratios of fat compared to muscle mass. Too much body fat puts you at increased risk for cardiovascular disease, heart attack, stroke, type 2 diabetes, and obesity-related cancers. In addition to exercise, following the Pritikin Eating Plan can help reduce your risk.  Decoding Lab Results  Clinical staff conducted group or individual video education with verbal and written material and guidebook.  Patient learns that lab test reflects one measurement whose values change over time and are influenced by many factors, including medication, stress, sleep, exercise, food, hydration, pre-existing medical conditions, and more. It is recommended to use the knowledge from this video to become more involved with your lab results and evaluate your numbers to speak with your doctor.   Diseases of Our Time - Overview  Clinical staff conducted group or individual video education with verbal and written material and guidebook.  Patient learns that according to the CDC, 50% to 70% of chronic diseases (such as obesity, type 2 diabetes, elevated lipids, hypertension, and heart disease) are avoidable through lifestyle improvements including healthier food choices, listening to satiety cues, and increased physical activity.  Sleep Disorders Clinical staff conducted group or individual video education with verbal and written material and guidebook.  Patient learns how good quality and duration of sleep are important to overall health and well-being. Patient also learns about sleep disorders and how they impact health along with recommendations to address them, including discussing with a physician.  Nutrition  Dining Out - Part 2 Clinical staff conducted group or individual video education with verbal and written material and guidebook.  Patient learns how to plan ahead and communicate in order to maximize their dining experience in a healthy and nutritious manner.  Included are recommended food choices based on the type of restaurant the patient is visiting.   Fueling a Banker conducted group or individual video education with verbal and written material and guidebook.  There is a strong connection between our food choices and our health. Diseases like obesity and type 2 diabetes are very prevalent and are in large-part due to lifestyle choices. The Pritikin Eating Plan provides plenty of food and hunger-curbing satisfaction. It is easy to follow, affordable, and helps reduce health risks.  Menu Workshop  Clinical staff conducted group or individual video  education with verbal and written material and guidebook.  Patient learns that restaurant meals can sabotage health goals because they are often packed with calories, fat, sodium, and sugar. Recommendations include strategies to plan ahead and to communicate with the manager, chef, or server to help order a healthier meal.  Planning Your Eating Strategy  Clinical staff conducted group or individual video education with verbal and written material and guidebook.  Patient learns about the Pritikin Eating Plan and its benefit of reducing the risk of disease. The Pritikin Eating Plan does not focus on calories. Instead, it emphasizes high-quality, nutrient-rich foods. By knowing the characteristics of the foods, we choose, we can determine their calorie density and make informed decisions.  Targeting Your Nutrition Priorities  Clinical staff conducted group or individual video education with verbal and written material and guidebook.  Patient learns that lifestyle habits have a tremendous impact on disease risk and progression. This video provides eating and physical activity recommendations based on your personal health goals, such as reducing LDL cholesterol, losing weight, preventing or controlling type 2 diabetes, and reducing high blood pressure.  Vitamins and Minerals  Clinical staff  conducted group or individual video education with verbal and written material and guidebook.  Patient learns different ways to obtain key vitamins and minerals, including through a recommended healthy diet. It is important to discuss all supplements you take with your doctor.   Healthy Mind-Set    Smoking Cessation  Clinical staff conducted group or individual video education with verbal and written material and guidebook.  Patient learns that cigarette smoking and tobacco addiction pose a serious health risk which affects millions of people. Stopping smoking will significantly reduce the risk of heart disease, lung disease, and many forms of cancer. Recommended strategies for quitting are covered, including working with your doctor to develop a successful plan.  Culinary   Becoming a Set designer conducted group or individual video education with verbal and written material and guidebook.  Patient learns that cooking at home can be healthy, cost-effective, quick, and puts them in control. Keys to cooking healthy recipes will include looking at your recipe, assessing your equipment needs, planning ahead, making it simple, choosing cost-effective seasonal ingredients, and limiting the use of added fats, salts, and sugars.  Cooking - Breakfast and Snacks  Clinical staff conducted group or individual video education with verbal and written material and guidebook.  Patient learns how important breakfast is to satiety and nutrition through the entire day. Recommendations include key foods to eat during breakfast to help stabilize blood sugar levels and to prevent overeating at meals later in the day. Planning ahead is also a key component.  Cooking - Educational psychologist conducted group or individual video education with verbal and written material and guidebook.  Patient learns eating strategies to improve overall health, including an approach to cook more at home.  Recommendations include thinking of animal protein as a side on your plate rather than center stage and focusing instead on lower calorie dense options like vegetables, fruits, whole grains, and plant-based proteins, such as beans. Making sauces in large quantities to freeze for later and leaving the skin on your vegetables are also recommended to maximize your experience.  Cooking - Healthy Salads and Dressing Clinical staff conducted group or individual video education with verbal and written material and guidebook.  Patient learns that vegetables, fruits, whole grains, and legumes are the foundations of the Pritikin Eating Plan. Recommendations include  how to incorporate each of these in flavorful and healthy salads, and how to create homemade salad dressings. Proper handling of ingredients is also covered. Cooking - Soups and [employer] - Soups and Desserts Clinical staff conducted group or individual video education with verbal and written material and guidebook.  Patient learns that Pritikin soups and desserts make for easy, nutritious, and delicious snacks and meal components that are low in sodium, fat, sugar, and calorie density, while high in vitamins, minerals, and filling fiber. Recommendations include simple and healthy ideas for soups and desserts.   Overview     The Pritikin Solution Program Overview Clinical staff conducted group or individual video education with verbal and written material and guidebook.  Patient learns that the results of the Pritikin Program have been documented in more than 100 articles published in peer-reviewed journals, and the benefits include reducing risk factors for (and, in some cases, even reversing) high cholesterol, high blood pressure, type 2 diabetes, obesity, and more! An overview of the three key pillars of the Pritikin Program will be covered: eating well, doing regular exercise, and having a healthy mind-set.  WORKSHOPS   Exercise: Exercise Basics: Building Your Action Plan Clinical staff led group instruction and group discussion with PowerPoint presentation and patient guidebook. To enhance the learning environment the use of posters, models and videos may be added. At the conclusion of this workshop, patients will comprehend the difference between physical activity and exercise, as well as the benefits of incorporating both, into their routine. Patients will understand the FITT (Frequency, Intensity, Time, and Type) principle and how to use it to build an exercise action plan. In addition, safety concerns and other considerations for exercise and cardiac rehab will be addressed by the presenter. The purpose of this lesson is to promote a comprehensive and effective weekly exercise routine in order to improve patients' overall level of fitness.   Managing Heart Disease: Your Path to a Healthier Heart Clinical staff led group instruction and group discussion with PowerPoint presentation and patient guidebook. To enhance the learning environment the use of posters, models and videos may be added.At the conclusion of this workshop, patients will understand the anatomy and physiology of the heart. Additionally, they will understand how Pritikin's three pillars impact the risk factors, the progression, and the management of heart disease.  The purpose of this lesson is to provide a high-level overview of the heart, heart disease, and how the Pritikin lifestyle positively impacts risk factors.  Exercise Biomechanics Clinical staff led group instruction and group discussion with PowerPoint presentation and patient guidebook. To enhance the learning environment the use of posters, models and videos may be added. Patients will learn how the structural parts of their bodies function and how these functions impact their daily activities, movement, and exercise. Patients will learn how to promote a neutral spine, learn how  to manage pain, and identify ways to improve their physical movement in order to promote healthy living. The purpose of this lesson is to expose patients to common physical limitations that impact physical activity. Participants will learn practical ways to adapt and manage aches and pains, and to minimize their effect on regular exercise. Patients will learn how to maintain good posture while sitting, walking, and lifting.  Balance Training and Fall Prevention  Clinical staff led group instruction and group discussion with PowerPoint presentation and patient guidebook. To enhance the learning environment the use of posters, models and videos may be added. At the conclusion of  this workshop, patients will understand the importance of their sensorimotor skills (vision, proprioception, and the vestibular system) in maintaining their ability to balance as they age. Patients will apply a variety of balancing exercises that are appropriate for their current level of function. Patients will understand the common causes for poor balance, possible solutions to these problems, and ways to modify their physical environment in order to minimize their fall risk. The purpose of this lesson is to teach patients about the importance of maintaining balance as they age and ways to minimize their risk of falling.  WORKSHOPS   Nutrition:  Fueling a Ship broker led group instruction and group discussion with PowerPoint presentation and patient guidebook. To enhance the learning environment the use of posters, models and videos may be added. Patients will review the foundational principles of the Pritikin Eating Plan and understand what constitutes a serving size in each of the food groups. Patients will also learn Pritikin-friendly foods that are better choices when away from home and review make-ahead meal and snack options. Calorie density will be reviewed and applied to three nutrition priorities:  weight maintenance, weight loss, and weight gain. The purpose of this lesson is to reinforce (in a group setting) the key concepts around what patients are recommended to eat and how to apply these guidelines when away from home by planning and selecting Pritikin-friendly options. Patients will understand how calorie density may be adjusted for different weight management goals.  Mindful Eating  Clinical staff led group instruction and group discussion with PowerPoint presentation and patient guidebook. To enhance the learning environment the use of posters, models and videos may be added. Patients will briefly review the concepts of the Pritikin Eating Plan and the importance of low-calorie dense foods. The concept of mindful eating will be introduced as well as the importance of paying attention to internal hunger signals. Triggers for non-hunger eating and techniques for dealing with triggers will be explored. The purpose of this lesson is to provide patients with the opportunity to review the basic principles of the Pritikin Eating Plan, discuss the value of eating mindfully and how to measure internal cues of hunger and fullness using the Hunger Scale. Patients will also discuss reasons for non-hunger eating and learn strategies to use for controlling emotional eating.  Targeting Your Nutrition Priorities Clinical staff led group instruction and group discussion with PowerPoint presentation and patient guidebook. To enhance the learning environment the use of posters, models and videos may be added. Patients will learn how to determine their genetic susceptibility to disease by reviewing their family history. Patients will gain insight into the importance of diet as part of an overall healthy lifestyle in mitigating the impact of genetics and other environmental insults. The purpose of this lesson is to provide patients with the opportunity to assess their personal nutrition priorities by looking at  their family history, their own health history and current risk factors. Patients will also be able to discuss ways of prioritizing and modifying the Pritikin Eating Plan for their highest risk areas  Menu  Clinical staff led group instruction and group discussion with PowerPoint presentation and patient guidebook. To enhance the learning environment the use of posters, models and videos may be added. Using menus brought in from [employer], or printed from [employer], patients will apply the Pritikin dining out guidelines that were presented in the [employer] video. Patients will also be able to practice these guidelines in a variety of  provided scenarios. The purpose of this lesson is to provide patients with the opportunity to practice hands-on learning of the Pritikin Dining Out guidelines with actual menus and practice scenarios.  Label Reading Clinical staff led group instruction and group discussion with PowerPoint presentation and patient guidebook. To enhance the learning environment the use of posters, models and videos may be added. Patients will review and discuss the Pritikin label reading guidelines presented in Pritikin's Label Reading Educational series video. Using fool labels brought in from local grocery stores and markets, patients will apply the label reading guidelines and determine if the packaged food meet the Pritikin guidelines. The purpose of this lesson is to provide patients with the opportunity to review, discuss, and practice hands-on learning of the Pritikin Label Reading guidelines with actual packaged food labels. Cooking School  Pritikin's [employer] are designed to teach patients ways to prepare quick, simple, and affordable recipes at home. The importance of nutrition's role in chronic disease risk reduction is reflected in its emphasis in the overall Pritikin program. By learning how to prepare essential core Pritikin Eating  Plan recipes, patients will increase control over what they eat; be able to customize the flavor of foods without the use of added salt, sugar, or fat; and improve the quality of the food they consume. By learning a set of core recipes which are easily assembled, quickly prepared, and affordable, patients are more likely to prepare more healthy foods at home. These workshops focus on convenient breakfasts, simple entres, side dishes, and desserts which can be prepared with minimal effort and are consistent with nutrition recommendations for cardiovascular risk reduction. Cooking [employer] are taught by a Armed forces logistics/support/administrative officer (RD) who has been trained by the [employer]. The chef or RD has a clear understanding of the importance of minimizing - if not completely eliminating - added fat, sugar, and sodium in recipes. Throughout the series of Cooking School Workshop sessions, patients will learn about healthy ingredients and efficient methods of cooking to build confidence in their capability to prepare    Cooking School weekly topics:  Adding Flavor- Sodium-Free  Fast and Healthy Breakfasts  Powerhouse Plant-Based Proteins  Satisfying Salads and Dressings  Simple Sides and Sauces  International Cuisine-Spotlight on the [employer] Zones  Delicious Desserts  Savory Soups  [employer] - Meals in a Astronomer Appetizers and Snacks  Comforting Weekend Breakfasts  One-Pot Wonders   Fast Evening Meals  Landscape architect Your Pritikin Plate  WORKSHOPS   Healthy Mindset (Psychosocial):  Focused Goals, Sustainable Changes Clinical staff led group instruction and group discussion with PowerPoint presentation and patient guidebook. To enhance the learning environment the use of posters, models and videos may be added. Patients will be able to apply effective goal setting strategies to establish at least one personal goal, and then take consistent, meaningful  action toward that goal. They will learn to identify common barriers to achieving personal goals and develop strategies to overcome them. Patients will also gain an understanding of how our mind-set can impact our ability to achieve goals and the importance of cultivating a positive and growth-oriented mind-set. The purpose of this lesson is to provide patients with a deeper understanding of how to set and achieve personal goals, as well as the tools and strategies needed to overcome common obstacles which may arise along the way.  From Head to Heart: The Power of a Healthy Outlook  Clinical staff led group instruction  and group discussion with PowerPoint presentation and patient guidebook. To enhance the learning environment the use of posters, models and videos may be added. Patients will be able to recognize and describe the impact of emotions and mood on physical health. They will discover the importance of self-care and explore self-care practices which may work for them. Patients will also learn how to utilize the 4 C's to cultivate a healthier outlook and better manage stress and challenges. The purpose of this lesson is to demonstrate to patients how a healthy outlook is an essential part of maintaining good health, especially as they continue their cardiac rehab journey.  Healthy Sleep for a Healthy Heart Clinical staff led group instruction and group discussion with PowerPoint presentation and patient guidebook. To enhance the learning environment the use of posters, models and videos may be added. At the conclusion of this workshop, patients will be able to demonstrate knowledge of the importance of sleep to overall health, well-being, and quality of life. They will understand the symptoms of, and treatments for, common sleep disorders. Patients will also be able to identify daytime and nighttime behaviors which impact sleep, and they will be able to apply these tools to help manage sleep-related  challenges. The purpose of this lesson is to provide patients with a general overview of sleep and outline the importance of quality sleep. Patients will learn about a few of the most common sleep disorders. Patients will also be introduced to the concept of "sleep hygiene," and discover ways to self-manage certain sleeping problems through simple daily behavior changes. Finally, the workshop will motivate patients by clarifying the links between quality sleep and their goals of heart-healthy living.   Recognizing and Reducing Stress Clinical staff led group instruction and group discussion with PowerPoint presentation and patient guidebook. To enhance the learning environment the use of posters, models and videos may be added. At the conclusion of this workshop, patients will be able to understand the types of stress reactions, differentiate between acute and chronic stress, and recognize the impact that chronic stress has on their health. They will also be able to apply different coping mechanisms, such as reframing negative self-talk. Patients will have the opportunity to practice a variety of stress management techniques, such as deep abdominal breathing, progressive muscle relaxation, and/or guided imagery.  The purpose of this lesson is to educate patients on the role of stress in their lives and to provide healthy techniques for coping with it.  Learning Barriers/Preferences:  Learning Barriers/Preferences - 06/23/23 1523       Learning Barriers/Preferences   Learning Barriers None    Learning Preferences Computer/Internet;Written Material             Education Topics:  Knowledge Questionnaire Score:  Knowledge Questionnaire Score - 06/23/23 1512       Knowledge Questionnaire Score   Pre Score 22/24             Core Components/Risk Factors/Patient Goals at Admission:  Personal Goals and Risk Factors at Admission - 06/23/23 1607       Core Components/Risk Factors/Patient  Goals on Admission   Hypertension Yes    Intervention Provide education on lifestyle modifcations including regular physical activity/exercise, weight management, moderate sodium restriction and increased consumption of fresh fruit, vegetables, and low fat dairy, alcohol moderation, and smoking cessation.;Monitor prescription use compliance.    Expected Outcomes Short Term: Continued assessment and intervention until BP is < 140/80mm HG in hypertensive participants. < 130/17mm HG in hypertensive participants  with diabetes, heart failure or chronic kidney disease.;Long Term: Maintenance of blood pressure at goal levels.    Lipids Yes    Intervention Provide education and support for participant on nutrition & aerobic/resistive exercise along with prescribed medications to achieve LDL 70mg , HDL >40mg .    Expected Outcomes Short Term: Participant states understanding of desired cholesterol values and is compliant with medications prescribed. Participant is following exercise prescription and nutrition guidelines.;Long Term: Cholesterol controlled with medications as prescribed, with individualized exercise RX and with personalized nutrition plan. Value goals: LDL < 70mg , HDL > 40 mg.    Stress Yes    Intervention Offer individual and/or small group education and counseling on adjustment to heart disease, stress management and health-related lifestyle change. Teach and support self-help strategies.;Refer participants experiencing significant psychosocial distress to appropriate mental health specialists for further evaluation and treatment. When possible, include family members and significant others in education/counseling sessions.    Expected Outcomes Short Term: Participant demonstrates changes in health-related behavior, relaxation and other stress management skills, ability to obtain effective social support, and compliance with psychotropic medications if prescribed.;Long Term: Emotional wellbeing is  indicated by absence of clinically significant psychosocial distress or social isolation.    Personal Goal Other Yes    Personal Goal Short term: knowledge on heart health, Ex routine that she can stick with Long term: Balance, flexibility and stamina    Intervention Will continue to monitor pt and progress workloads as tolerated without sign or symptom    Expected Outcomes Pt will achieve her goals and gain strength             Core Components/Risk Factors/Patient Goals Review:    Core Components/Risk Factors/Patient Goals at Discharge (Final Review):    ITP Comments:  ITP Comments     Row Name 06/23/23 1017           ITP Comments Armanda Magic, MD: Medical Director.  Introduction to the [facility] /  Intensive Cardiac Rehab.  Initial orientation packet reviewed with the patient.                Comments: Participant attended orientation for the cardiac rehabilitation program on  06/23/2023  to perform initial intake and exercise walk test. Patient introduced to the [facility] education and orientation packet was reviewed. Completed 6-minute walk test, measurements, initial ITP, and exercise prescription. Vital signs stable. Telemetry-normal sinus rhythm, asymptomatic.   Service time was from 1015 to 1240.

## 2023-06-23 NOTE — Progress Notes (Signed)
 Cardiac Rehab Medication Review   Does the patient  feel that his/her medications are working for him/her?  yes  Has the patient been experiencing any side effects to the medications prescribed?  no  Does the patient measure his/her own blood pressure or blood glucose at home?  yes   Does the patient have any problems obtaining medications due to transportation or finances?   no  Understanding of regimen: excellent Understanding of indications: excellent Potential of compliance: excellent    Comments: Pt feel good with her medication routine. She has a BP cuff at home and checks it regularly.     Harrie Jeans 06/23/2023 4:17 PM

## 2023-06-27 ENCOUNTER — Encounter: Payer: Self-pay | Admitting: Family Medicine

## 2023-06-27 ENCOUNTER — Ambulatory Visit (INDEPENDENT_AMBULATORY_CARE_PROVIDER_SITE_OTHER): Payer: Medicare Other | Admitting: Family Medicine

## 2023-06-27 VITALS — BP 126/82 | HR 68 | Temp 97.9°F | Wt 154.8 lb

## 2023-06-27 DIAGNOSIS — I2119 ST elevation (STEMI) myocardial infarction involving other coronary artery of inferior wall: Secondary | ICD-10-CM

## 2023-06-27 DIAGNOSIS — I700 Atherosclerosis of aorta: Secondary | ICD-10-CM

## 2023-06-27 DIAGNOSIS — E782 Mixed hyperlipidemia: Secondary | ICD-10-CM

## 2023-06-27 DIAGNOSIS — I25110 Atherosclerotic heart disease of native coronary artery with unstable angina pectoris: Secondary | ICD-10-CM

## 2023-06-27 DIAGNOSIS — C649 Malignant neoplasm of unspecified kidney, except renal pelvis: Secondary | ICD-10-CM | POA: Diagnosis not present

## 2023-06-27 MED ORDER — WEGOVY 2.4 MG/0.75ML ~~LOC~~ SOAJ: 2.4000 mg | SUBCUTANEOUS | 5 refills | Status: DC

## 2023-06-27 NOTE — Progress Notes (Signed)
 Patient ID: [name], female  DOB: [birth year], 63 y.o.   MRN: [id number] Patient Care Team    Relationship Specialty Notifications Start End  Natalia Leatherwood, DO PCP - General Family Medicine  03/05/15   Melene Plan, MD Referring Physician Urology  04/08/17   Truitt Merle, MD Referring Physician Gastroenterology  04/08/17   Waymon Budge, MD Consulting Physician Pulmonary Disease  04/08/17   Olivia Mackie, MD Consulting Physician Obstetrics and Gynecology  06/05/21   Orbie Pyo, MD Consulting Physician Cardiology  06/27/23     Chief Complaint  Patient presents with   Hypertension   Hyperlipidemia    Subjective: [name] is a 63 y.o.  Female  present for [facility] follow up Hypertension/hyperlipidemia/morbid obesity- bmi >30/hypo/NSTEMI Pt reports compliance with HCTZ 25 mg every day, plavix/asa, crestor 40, metoprolol 25 NSTEMI 05/2023 w/ cath- no stent Patient denies chest pain, shortness of breath, dizziness or lower extremity edema.  Pt does not take a  daily baby ASA. Pt is not prescribed statin. She did start red yeast rice and psyllium supplements. Diet: Cutting out soda, sugar Exercise: routinely.  RF: HTN, HLD, obesity, FHx HD Cath: 05/31/2023     RPDA lesion is 100% stenosed.   1.  No high-grade proximal occlusions with possible vessel cutoff of distal PDA. 2.  Highly elevated LVEDP of 32 mmHg with preserved LVEF with mid inferior wall motion abnormality.   Recommendation: Patient was administered 10 mg of IV Lasix given that she is Lasix nave.  Will follow troponins.  If positive will treat with dual antiplatelet therapy with Plavix for 1 year given distal vessel cutoff/embolization.  Monitoring the to see unit.  The case was reviewed with Dr. Elwyn Lade and Herbie Baltimore.     H/O RCC:  Pt has a h/o RCC left kidney with partial nephrectomy and RCC right kidney with cryoablation.  Since her last visit she has undergone some more changes with  her renal cell carcinoma. She is following with specialties routinely every 6 months for now.   E66.9> now overweight: Weight (lbs): 201> 191>195> 184>172>154 BMI 38.05> 36.2>36.84> 34.84> 32.5>29.25 Exercise: working out at home on recombinant bike and walking. Did not like the gym Water consumption:getting  80 ounces, also drinking sugar free electrolyte drink.  Diet: lean meat and veggies, berries. > feels the diet is becoming easier to manage Tolerating wegovy 2.4 mg  A1c 5.1 during hospitalization     06/27/2023   10:32 AM 06/23/2023    3:21 PM 11/17/2022   10:15 AM 10/20/2022    8:39 AM 07/22/2022    9:13 AM  Depression screen PHQ 2/9  Decreased Interest 0 0 0 0 0  Down, Depressed, Hopeless 0 1 0 0 1  PHQ - 2 Score 0 1 0 0 1  Altered sleeping 0 0     Tired, decreased energy 1 1     Change in appetite 0 0     Feeling bad or failure about yourself  0 0     Trouble concentrating 0 0     Moving slowly or fidgety/restless 0 0     Suicidal thoughts 0 0     PHQ-9 Score 1 2     Difficult doing work/chores Not difficult at all Not difficult at all         06/27/2023   10:33 AM 10/20/2022    8:39 AM 05/21/2022    8:33 AM 11/20/2021   10:53 AM  GAD 7 : Generalized Anxiety Score  Nervous, Anxious, on Edge 1 0 1 1  Control/stop worrying 1 0 3 1  Worry too much - different things 1 0 3 1  Trouble relaxing 1 0 3 0  Restless 0 0 0 0  Easily annoyed or irritable 0 0 0 0  Afraid - awful might happen 1 0 1 0  Total GAD 7 Score 5 0 11 3  Anxiety Difficulty Not difficult at all Not difficult at all Somewhat difficult     Immunization History  Administered Date(s) Administered   Influenza Split 01/24/2012, 01/06/2013, 01/03/2014, 12/16/2014, 01/15/2015   Influenza, Seasonal, Injecte, Preservative Fre 12/28/2022   Influenza,inj,Quad PF,6+ Mos 12/16/2014, 01/06/2016, 11/25/2016, 12/15/2017, 12/15/2018, 01/07/2020, 12/15/2020, 11/20/2021   Influenza-Unspecified 12/16/2014, 01/06/2016,  11/25/2016, 12/15/2017, 12/15/2018, 01/07/2020   PFIZER(Purple Top)SARS-COV-2 Vaccination 06/21/2019, 07/12/2019, 01/15/2020, 08/26/2020, 12/30/2020   Pneumococcal Conjugate-13 03/24/2015   Pneumococcal Polysaccharide-23 12/17/2013   Td 09/03/2020   Tdap 03/24/2015     Past Medical History:  Diagnosis Date   Anxiety    psychiatrist: Dr. Lafayette Dragon    Arthritis    Asthma    Bronchitis    Colitis    Colon polyps    COVID-19 09/2020   Depression    psychiatrist: Dr. Lafayette Dragon   Elevated liver enzymes    Dr. Isla Pence following   Essential hypertension, benign 03/05/2015   Fatty liver    GERD (gastroesophageal reflux disease)    Heart murmur    History of chicken pox    History of kidney stones    Hypertension    Multiple pulmonary nodules determined by computed tomography of lung 03/07/2014   Last CT suggested clearance of tree-in-bud.  Follows with Dr. Joni Fears     Chest CT [facility] 2015-micronodules, groundglass, tree in bud right upper lobe  CT chest 03/15/16 No evidence of interstitial lung disease or mycobacterium aviumcomplex. Scattered pulmonary nodules. No f/u needed- low risk   Osteoarthritis of right knee 07/29/2021   Postmenopausal bleeding 06/27/2020   Primary osteoarthritis of both knees 04/09/2011   Formatting of this note might be different from the original. Overview:  S/p left total knee replacement   Renal cell carcinoma    hx of left and right; Dr. Laurelyn Sickle   Trochanteric bursitis, left hip 09/27/2018   No Known Allergies  Past Surgical History:  Procedure Laterality Date   CHOLECYSTECTOMY     FETAL RADIO FREQUENCY ABLATION     FRACTURE SURGERY Left    ankle   JOINT REPLACEMENT Left 2009   knee   LEFT HEART CATH AND CORONARY ANGIOGRAPHY N/A 05/31/2023   Procedure: LEFT HEART CATH AND CORONARY ANGIOGRAPHY;  Surgeon: Orbie Pyo, MD;  Location: [facility];  Service: Cardiovascular;  Laterality: N/A;   NEPHRECTOMY     partial LT   OPEN SURGICAL REPAIR OF  GLUTEAL TENDON Left 09/27/2018   Procedure: Left hip bursectomy; gluteal tendon repair;  Surgeon: Ollen Gross, MD;  Location: [facility];  Service: Orthopedics;  Laterality: Left;    TONSILLECTOMY     TOTAL KNEE ARTHROPLASTY Right 12/21/2021   Procedure: TOTAL KNEE ARTHROPLASTY;  Surgeon: Ollen Gross, MD;  Location: [facility];  Service: Orthopedics;  Laterality: Right;   Family History  Problem Relation Age of Onset   Hypertension Mother    Alzheimer's disease Mother    Arthritis Mother    Hypertension Father    Alzheimer's disease Father    COPD Father    Heart disease Father  Arthritis Father    Hypertension Sister    Social History   Social History Narrative   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   Lives with her husband, son and granddaughter.   Drinks caffeinated beverages.   Wears her seatbelt, wears a bicycle helmet, there is a smoke detector in home. There are no firearms in the home.   Patient feels safe in her relationships.    Allergies as of 06/27/2023   No Known Allergies      Medication List        Accurate as of June 27, 2023 10:54 AM. If you have any questions, ask your nurse or doctor.          albuterol (2.5 MG/3ML) 0.083% nebulizer solution Commonly known as: PROVENTIL Take 3 mLs (2.5 mg total) by nebulization every 6 (six) hours as needed for wheezing or shortness of breath.   albuterol 108 (90 Base) MCG/ACT inhaler Commonly known as: VENTOLIN HFA USE 1 TO 2 INHALATIONS EVERY 6 HOURS AS NEEDED FOR SHORTNESS OF BREATH   ALPRAZolam 0.5 MG tablet Commonly known as: XANAX Take 0.5 mg by mouth at bedtime.   aspirin 81 MG chewable tablet Chew 1 tablet (81 mg total) by mouth daily.   clopidogrel 75 MG tablet Commonly known as: PLAVIX Take 1 tablet (75 mg total) by mouth daily with breakfast.   FLUoxetine 20 MG capsule Commonly known as: PROZAC Take 20 mg by mouth at bedtime.    fluticasone-salmeterol 100-50 MCG/ACT Aepb Commonly known as: Wixela Inhub Inhale 1 puff then rinse mouth, once daily What changed:  how much to take how to take this when to take this additional instructions   MAGNESIUM PO Take 1 tablet by mouth in the morning.   metoprolol succinate 25 MG 24 hr tablet Commonly known as: Toprol XL Take 1 tablet (25 mg total) by mouth daily.   nitroGLYCERIN 0.4 MG SL tablet Commonly known as: NITROSTAT Place 1 tablet (0.4 mg total) under the tongue every 5 (five) minutes as needed.   OLANZapine-FLUoxetine 3-25 MG capsule Commonly known as: SYMBYAX Take 1 capsule by mouth at bedtime.   PSYLLIUM HUSK PO Take 2 capsules by mouth in the morning and at bedtime.   rosuvastatin 40 MG tablet Commonly known as: CRESTOR Take 1 tablet (40 mg total) by mouth daily.   VITAMIN D-3 PO Take 1 tablet by mouth in the morning.   Wegovy 2.4 MG/0.75ML Soaj Generic drug: Semaglutide-Weight Management Inject 2.4 mg into the skin once a week.        All past medical history, surgical history, allergies, family history, immunizations andmedications were updated in the EMR today and reviewed under the history and medication portions of their EMR.      ROS: 14 pt review of systems performed and negative (unless mentioned in an HPI)  Objective: BP 126/82   Pulse 68   Temp 97.9 F (36.6 C)   Wt 154 lb 12.8 oz (70.2 kg)   LMP 04/03/2013   SpO2 99%   BMI 29.25 kg/m  Physical Exam Vitals and nursing note reviewed.  Constitutional:      General: She is not in acute distress.    Appearance: Normal appearance. She is not ill-appearing, toxic-appearing or diaphoretic.  HENT:     Head: Normocephalic and atraumatic.  Eyes:     General: No scleral icterus.       Right eye: No discharge.  Left eye: No discharge.     Extraocular Movements: Extraocular movements intact.     Conjunctiva/sclera: Conjunctivae normal.     Pupils: Pupils are equal,  round, and reactive to light.  Cardiovascular:     Rate and Rhythm: Normal rate and regular rhythm.     Heart sounds: No murmur heard. Pulmonary:     Effort: Pulmonary effort is normal. No respiratory distress.     Breath sounds: Normal breath sounds. No wheezing, rhonchi or rales.  Musculoskeletal:     Cervical back: Neck supple. No tenderness.     Right lower leg: No edema.     Left lower leg: No edema.  Lymphadenopathy:     Cervical: No cervical adenopathy.  Skin:    General: Skin is warm and dry.     Coloration: Skin is not jaundiced or pale.     Findings: No erythema or rash.  Neurological:     Mental Status: She is alert and oriented to person, place, and time. Mental status is at baseline.     Motor: No weakness.     Gait: Gait normal.  Psychiatric:        Mood and Affect: Mood normal.        Behavior: Behavior normal.        Thought Content: Thought content normal.        Judgment: Judgment normal.     No results found for this or any previous visit (from the past 48 hours).    Assessment/plan: [name] is a 63 y.o. female present for [facility] Anxiety and depression Follows w/ psych  Hyperlipidemia, unspecified hyperlipidemia type/Morbid obesity (HCC)/hypokalemia/NSTEMI Stable> new h/o NSTEMI Continue  HCTZ 25 , but prn for edema. Continue   Kdur BID . Continue crestor, metoprolol, plavix, asa  - per cardio - Goal LDL 100-130.  She will think about statin use if not at goal.  We also discussed Zetia as a potential.  Lengthy discussion today surrounding her concern of statins and her renal function. -For now continue red yeast rice and psyllium supplements.   Labs utd  Vitamin D deficiency/osteopneia - vit d UTD 05/2022 -DEXA UTD  01/02/2021 Pt dcd fosamax 35 weekly> severe GERD  Morbid obesity (HCC)/Elevated hemoglobin A1c/Weight loss counseling, encounter for/NSTEMI Patient was counseled on exercise, calorie counting, weight loss and potential  medications to help with weight loss today. -Patient was provided with online resources for: Weekly net calorie calculator.  Applications for calorie counting.  Patient was advised to ensure she is taking in adequate nutrition daily by meeting calorie goals. -Patient was educated on dietary changes to not only lose weight but to eat healthy.  Patient was educated on glycemic index. -Patient was educated on exercise goal of 150 minutes a week (plus warm up and cool down) of cardiovascular exercise.  Patient was educated on heart rate for cardiovascular and fat burning zones. -Patient was encouraged to maintain adequate water consumption of at least 80 ounces a day, more if exercising/sweating. Continue Wegovy 2.4  mg.  Return in about 24 weeks (around 12/12/2023) for Routine chronic condition follow-up.   No orders of the defined types were placed in this encounter.   Meds ordered this encounter  Medications   WEGOVY 2.4 MG/0.75ML SOAJ    Sig: Inject 2.4 mg into the skin once a week.    Dispense:  3 mL    Refill:  5    Referral Orders  No referral(s) requested today  Electronically signed by: Felix Pacini, DO [facility]

## 2023-06-27 NOTE — Patient Instructions (Addendum)

## 2023-06-29 ENCOUNTER — Encounter (HOSPITAL_COMMUNITY)
Admission: RE | Admit: 2023-06-29 | Discharge: 2023-06-29 | Disposition: A | Discharge status: Home / Self Care | Source: Ambulatory Visit | Attending: Internal Medicine | Admitting: Internal Medicine

## 2023-06-29 DIAGNOSIS — Z48812 Encounter for surgical aftercare following surgery on the circulatory system: Secondary | ICD-10-CM | POA: Diagnosis not present

## 2023-06-29 DIAGNOSIS — I214 Non-ST elevation (NSTEMI) myocardial infarction: Secondary | ICD-10-CM | POA: Diagnosis not present

## 2023-06-29 DIAGNOSIS — I213 ST elevation (STEMI) myocardial infarction of unspecified site: Secondary | ICD-10-CM

## 2023-06-29 NOTE — Progress Notes (Signed)
 Daily Session Note  Patient Details  Name: [name] MRN: [id number] Date of Birth: 1961-02-26 Referring Provider:   Flowsheet Row INTENSIVE CARDIAC REHAB [name] from 06/23/2023 in [facility]  Referring Provider Betty. Alverda Skeans, MD       Encounter Date: 06/29/2023  Check In:  Session Check In - 06/29/23 1235       Check-In   Supervising physician immediately available to respond to emergencies [facility] MD immediately available    Physician(s) Betty Bridegroom NP    Location [facility]    Staff Present Betty Lighter, RN, Betty Conch, MS, ACSM-CEP, Exercise Physiologist;Betty Manus Gunning, MS, ACSM-CEP, CCRP, Exercise Physiologist;Betty Hale Bogus, MS, Exercise Physiologist;[name] BS, ACSM-CEP, Exercise Physiologist    Virtual Visit No    Medication changes reported     No    Fall or balance concerns reported    No    Tobacco Cessation No Change    Warm-up and Cool-down Performed as group-led instruction   CRP2 orientation today   Resistance Training Performed No    VAD Patient? No    PAD/SET Patient? No      Pain Assessment   Currently in Pain? No/denies    Pain Score 0-No pain    Multiple Pain Sites No             Capillary Blood Glucose: No results found for this or any previous visit (from the past 24 hours).   Exercise Prescription Changes - 06/29/23 1400       Response to Exercise   Blood Pressure (Admit) 122/78    Blood Pressure (Exercise) 134/70    Blood Pressure (Exit) 124/68    Heart Rate (Admit) 58 bpm    Heart Rate (Exercise) 102 bpm    Heart Rate (Exit) 68 bpm    Rating of Perceived Exertion (Exercise) 8    Symptoms None    Comments Pt's first day in the CRP2 program    Duration Continue with 30 min of aerobic exercise without signs/symptoms of physical distress.    Intensity THRR unchanged      Progression   Progression Continue to progress workloads  to maintain intensity without signs/symptoms of physical distress.    Average METs 2.1      Resistance Training   Training Prescription No    Weight No weights on wednesdays      Interval Training   Interval Training No      Recumbant Bike   Level 1    RPM 54    Watts 12    Minutes 15    METs 1.9      NuStep   Level 1    SPM 81    Minutes 15    METs 2.2             Social History   Tobacco Use  Smoking Status Never   Passive exposure: Never  Smokeless Tobacco Never    Goals Met:  Exercise tolerated well No report of concerns or symptoms today  Goals Unmet:  Not Applicable  Comments: Pt started cardiac rehab today.  Pt tolerated light exercise without difficulty. VSS, telemetry-Sinus Rhythm, asymptomatic.  Medication list reconciled. Pt denies barriers to medicaiton compliance.  PSYCHOSOCIAL ASSESSMENT:  PHQ-2. Pt has a history of depression and says that the current antidepressants she is taking is controlling her depression. [name] says that she has felt  anxious since her [name] and is says  she worried if she travels out of [address] will she get the same care although [place] attend church in [place]. Reassured the patient that our staff is adequately trained and will provide good care to her while she participates in cardiac rehab. [name] says that she has received counseling in the past and would like to talk to a counselor. Will send referral to [name]. [name] has her husband and a Dietitian of friends at [employer]. Will also notify [name]'s primary care provider [name] about her anxiety. [name] said that she felt a little better talking to staff at cardiac rehab today.  Pt enjoys reading, crossword puzzles, knitting, spending time with her 2 grandkids and church activities.   Pt oriented to exercise equipment and routine.    Understanding verbalized.Betty Headings RN BSN     Betty. Armanda [name] is Medical Director for Cardiac Rehab  at [name].

## 2023-07-01 ENCOUNTER — Encounter (HOSPITAL_COMMUNITY)
Admission: RE | Admit: 2023-07-01 | Discharge: 2023-07-01 | Disposition: A | Discharge status: Home / Self Care | Source: Ambulatory Visit | Attending: Internal Medicine | Admitting: Internal Medicine

## 2023-07-01 ENCOUNTER — Encounter: Payer: Self-pay | Admitting: Physician Assistant

## 2023-07-01 DIAGNOSIS — I213 ST elevation (STEMI) myocardial infarction of unspecified site: Secondary | ICD-10-CM

## 2023-07-01 DIAGNOSIS — R002 Palpitations: Secondary | ICD-10-CM | POA: Diagnosis not present

## 2023-07-01 DIAGNOSIS — Z48812 Encounter for surgical aftercare following surgery on the circulatory system: Secondary | ICD-10-CM | POA: Diagnosis not present

## 2023-07-01 DIAGNOSIS — I214 Non-ST elevation (NSTEMI) myocardial infarction: Secondary | ICD-10-CM | POA: Diagnosis not present

## 2023-07-04 ENCOUNTER — Encounter (HOSPITAL_COMMUNITY)
Admission: RE | Admit: 2023-07-04 | Discharge: 2023-07-04 | Disposition: A | Discharge status: Home / Self Care | Source: Ambulatory Visit | Attending: Internal Medicine | Admitting: Internal Medicine

## 2023-07-04 DIAGNOSIS — Z48812 Encounter for surgical aftercare following surgery on the circulatory system: Secondary | ICD-10-CM | POA: Diagnosis not present

## 2023-07-04 DIAGNOSIS — I214 Non-ST elevation (NSTEMI) myocardial infarction: Secondary | ICD-10-CM | POA: Diagnosis not present

## 2023-07-04 DIAGNOSIS — I213 ST elevation (STEMI) myocardial infarction of unspecified site: Secondary | ICD-10-CM

## 2023-07-05 NOTE — Progress Notes (Signed)
 Cardiac Individual Treatment Plan  Patient Details  Name: [name] MRN: [id number] Date of Birth: [birth year] Referring Provider:   Flowsheet Row INTENSIVE CARDIAC REHAB ORIENT from 06/23/2023 in [facility] for Heart, Vascular, & Lung Health  Referring Provider Dr. Alverda Skeans, MD       Initial Encounter Date:  Flowsheet Row INTENSIVE CARDIAC REHAB ORIENT from 06/23/2023 in [facility] for Heart, Vascular, & Lung Health  Date 06/23/23       Visit Diagnosis: 05/31/23 ST elevation myocardial infarction (STEMI)  Patient's Home Medications on Admission:  Current Outpatient Medications:    albuterol (PROVENTIL) (2.5 MG/3ML) 0.083% nebulizer solution, Take 3 mLs (2.5 mg total) by nebulization every 6 (six) hours as needed for wheezing or shortness of breath., Disp: 75 mL, Rfl: 12   albuterol (VENTOLIN HFA) 108 (90 Base) MCG/ACT inhaler, USE 1 TO 2 INHALATIONS EVERY 6 HOURS AS NEEDED FOR SHORTNESS OF BREATH, Disp: 25.5 g, Rfl: 3   ALPRAZolam (XANAX) 0.5 MG tablet, Take 0.5 mg by mouth at bedtime., Disp: , Rfl:    aspirin 81 MG chewable tablet, Chew 1 tablet (81 mg total) by mouth daily., Disp: 90 tablet, Rfl: 3   Cholecalciferol (VITAMIN D-3 PO), Take 1 tablet by mouth in the morning., Disp: , Rfl:    clopidogrel (PLAVIX) 75 MG tablet, Take 1 tablet (75 mg total) by mouth daily with breakfast., Disp: 90 tablet, Rfl: 3   FLUoxetine (PROZAC) 20 MG capsule, Take 20 mg by mouth at bedtime., Disp: , Rfl:    fluticasone-salmeterol (WIXELA INHUB) 100-50 MCG/ACT AEPB, Inhale 1 puff then rinse mouth, once daily (Patient taking differently: Inhale 1 puff into the lungs 2 (two) times daily.), Disp: 180 each, Rfl: 4   MAGNESIUM PO, Take 1 tablet by mouth in the morning., Disp: , Rfl:    metoprolol succinate (TOPROL XL) 25 MG 24 hr tablet, Take 1 tablet (25 mg total) by mouth daily., Disp: 90 tablet, Rfl: 3   nitroGLYCERIN (NITROSTAT) 0.4 MG SL  tablet, Place 1 tablet (0.4 mg total) under the tongue every 5 (five) minutes as needed. (Patient not taking: Reported on 06/27/2023), Disp: 25 tablet, Rfl: 3   OLANZapine-FLUoxetine (SYMBYAX) 3-25 MG capsule, Take 1 capsule by mouth at bedtime., Disp: , Rfl:    PSYLLIUM HUSK PO, Take 2 capsules by mouth in the morning and at bedtime., Disp: , Rfl:    rosuvastatin (CRESTOR) 40 MG tablet, Take 1 tablet (40 mg total) by mouth daily., Disp: 90 tablet, Rfl: 3   WEGOVY 2.4 MG/0.75ML SOAJ, Inject 2.4 mg into the skin once a week., Disp: 3 mL, Rfl: 5  Past Medical History: Past Medical History:  Diagnosis Date   Anxiety    psychiatrist: Dr. Lafayette Dragon    Arthritis    Asthma    Bronchitis    Colitis    Colon polyps    COVID-19 09/2020   Depression    psychiatrist: Dr. Lafayette Dragon   Elevated liver enzymes    Dr. Isla Pence following   Essential hypertension, benign 03/05/2015   Fatty liver    GERD (gastroesophageal reflux disease)    Heart murmur    History of chicken pox    History of kidney stones    Hypertension    Multiple pulmonary nodules determined by computed tomography of lung 03/07/2014   Last CT suggested clearance of tree-in-bud.  Follows with Dr. Joni Fears     Chest CT [facility] 2015-micronodules, groundglass, tree in bud  right upper lobe  CT chest 03/15/16 No evidence of interstitial lung disease or mycobacterium aviumcomplex. Scattered pulmonary nodules. No f/u needed- low risk   Osteoarthritis of right knee 07/29/2021   Postmenopausal bleeding 06/27/2020   Primary osteoarthritis of both knees 04/09/2011   Formatting of this note might be different from the original. Overview:  S/p left total knee replacement   Renal cell carcinoma    hx of left and right; Dr. Laurelyn Sickle   Trochanteric bursitis, left hip 09/27/2018    Tobacco Use: Social History   Tobacco Use  Smoking Status Never   Passive exposure: Never  Smokeless Tobacco Never    Labs: Review Flowsheet  More data exists       Latest Ref Rng & Units 12/15/2020 06/05/2021 05/07/2022 10/22/2022 05/31/2023  Labs for ITP Cardiac and Pulmonary Rehab  Cholestrol 0 - 200 mg/dL - 829  562  - 130   LDL (calc) 0 - 99 mg/dL - 865  784  - 696   Direct LDL mg/dL 295.2  - - - -  HDL-C >84 mg/dL - 13.24  40.10  - 45   Trlycerides <150 mg/dL - 272.5  366.4  - 51   Hemoglobin A1c 4.8 - 5.6 % - 5.7  6.3  5.2  5.2  5.2  5.2  5.1   TCO2 22 - 32 mmol/L - - - - 22     Details       Multiple values from one day are sorted in reverse-chronological order         Capillary Blood Glucose: No results found for: "GLUCAP"   Exercise Target Goals: Exercise Program Goal: Individual exercise prescription set using results from initial 6 min walk test and THRR while considering  patient's activity barriers and safety.   Exercise Prescription Goal: Initial exercise prescription builds to 30-45 minutes a day of aerobic activity, 2-3 days per week.  Home exercise guidelines will be given to patient during program as part of exercise prescription that the participant will acknowledge.  Activity Barriers & Risk Stratification:  Activity Barriers & Cardiac Risk Stratification - 06/23/23 1518       Activity Barriers & Cardiac Risk Stratification   Activity Barriers Left Knee Replacement;Right Knee Replacement;Joint Problems;Balance Concerns;History of Falls;Back Problems;Neck/Spine Problems;Deconditioning    Cardiac Risk Stratification High             6 Minute Walk:  6 Minute Walk     Row Name 06/23/23 1106         6 Minute Walk   Phase Initial     Distance 1200 feet     Walk Time 0 minutes     # of Rest Breaks 0     MPH 2.3     METS 2.73     RPE 11     Perceived Dyspnea  0     VO2 Peak 9.6     Symptoms No     Resting HR 62 bpm     Resting BP 124/80     Resting Oxygen Saturation  97 %     Exercise Oxygen Saturation  during 6 min walk 99 %     Max Ex. HR 81 bpm     Max Ex. BP 128/84     2 Minute Post BP 120/84               Oxygen Initial Assessment:   Oxygen Re-Evaluation:   Oxygen Discharge (Final Oxygen Re-Evaluation):   Initial Exercise  Prescription:  Initial Exercise Prescription - 06/23/23 1500       Date of Initial Exercise RX and Referring Provider   Date 06/23/23    Referring Provider Dr. Alverda Skeans, MD    Expected Discharge Date 09/14/23      Recumbant Bike   Level 1    RPM 60    Watts 15    Minutes 15    METs 2      NuStep   Level 1    SPM 80    Minutes 15    METs 1.7      Prescription Details   Frequency (times per week) 3    Duration Progress to 30 minutes of continuous aerobic without signs/symptoms of physical distress      Intensity   THRR 40-80% of Max Heartrate 63-126    Ratings of Perceived Exertion 11-13    Perceived Dyspnea 0-4      Progression   Progression Continue progressive overload as per policy without signs/symptoms or physical distress.      Resistance Training   Training Prescription Yes    Weight 2 lbs wts    Reps 10-15             Perform Capillary Blood Glucose checks as needed.  Exercise Prescription Changes:   Exercise Prescription Changes     Row Name 06/29/23 1300 06/29/23 1400           Response to Exercise   Blood Pressure (Admit) -- 122/78      Blood Pressure (Exercise) -- 134/70      Blood Pressure (Exit) -- 124/68      Heart Rate (Admit) -- 58 bpm      Heart Rate (Exercise) -- 102 bpm      Heart Rate (Exit) -- 68 bpm      Rating of Perceived Exertion (Exercise) -- 8      Symptoms -- None      Comments -- Pt's first day in the CRP2 program      Duration -- Continue with 30 min of aerobic exercise without signs/symptoms of physical distress.      Intensity -- THRR unchanged        Progression   Progression -- Continue to progress workloads to maintain intensity without signs/symptoms of physical distress.      Average METs -- 2.1        Resistance Training   Training Prescription -- No       Weight -- No weights on wednesdays        Interval Training   Interval Training -- No        Recumbant Bike   Level -- 1      RPM -- 54      Watts -- 12      Minutes -- 15      METs -- 1.9        NuStep   Level -- 1      SPM -- 81      Minutes -- 15      METs -- 2.2               Exercise Comments:   Exercise Comments     Row Name 06/29/23 1413           Exercise Comments Pt's first day in the CRP2 program. No complaints with todays sesssion. Pt voiced that bike was too easy, will increase to level 2 next session.  Exercise Goals and Review:   Exercise Goals     Row Name 06/23/23 1513             Exercise Goals   Increase Physical Activity Yes       Intervention Provide advice, education, support and counseling about physical activity/exercise needs.;Develop an individualized exercise prescription for aerobic and resistive training based on initial evaluation findings, risk stratification, comorbidities and participant's personal goals.       Expected Outcomes Short Term: Attend rehab on a regular basis to increase amount of physical activity.;Long Term: Exercising regularly at least 3-5 days a week.;Long Term: Add in home exercise to make exercise part of routine and to increase amount of physical activity.       Increase Strength and Stamina Yes       Intervention Provide advice, education, support and counseling about physical activity/exercise needs.;Develop an individualized exercise prescription for aerobic and resistive training based on initial evaluation findings, risk stratification, comorbidities and participant's personal goals.       Expected Outcomes Short Term: Increase workloads from initial exercise prescription for resistance, speed, and METs.;Short Term: Perform resistance training exercises routinely during rehab and add in resistance training at home;Long Term: Improve cardiorespiratory fitness, muscular endurance and  strength as measured by increased METs and functional capacity ( )       Able to understand and use rate of perceived exertion (RPE) scale Yes       Intervention Provide education and explanation on how to use RPE scale       Expected Outcomes Short Term: Able to use RPE daily in rehab to express subjective intensity level;Long Term:  Able to use RPE to guide intensity level when exercising independently       Knowledge and understanding of Target Heart Rate Range (THRR) Yes       Intervention Provide education and explanation of THRR including how the numbers were predicted and where they are located for reference       Expected Outcomes Short Term: Able to state/look up THRR;Long Term: Able to use THRR to govern intensity when exercising independently;Short Term: Able to use daily as guideline for intensity in rehab       Understanding of Exercise Prescription Yes       Intervention Provide education, explanation, and written materials on patient's individual exercise prescription       Expected Outcomes Short Term: Able to explain program exercise prescription;Long Term: Able to explain home exercise prescription to exercise independently                Exercise Goals Re-Evaluation :  Exercise Goals Re-Evaluation     Row Name 06/29/23 1411             Exercise Goal Re-Evaluation   Exercise Goals Review Increase Physical Activity;Increase Strength and Stamina;Able to understand and use rate of perceived exertion (RPE) scale;Knowledge and understanding of Target Heart Rate Range (THRR);Understanding of Exercise Prescription       Comments Pt's first day in the CRP2 program. Pt understands the Exercise Rx, RPE scale and [facility]       Expected Outcomes Will continue to monitor patient and progress exercise workloads as tolerated.                Discharge Exercise Prescription (Final Exercise Prescription Changes):  Exercise Prescription Changes - 06/29/23 1400       Response  to Exercise   Blood Pressure (Admit) 122/78    Blood Pressure (  Exercise) 134/70    Blood Pressure (Exit) 124/68    Heart Rate (Admit) 58 bpm    Heart Rate (Exercise) 102 bpm    Heart Rate (Exit) 68 bpm    Rating of Perceived Exertion (Exercise) 8    Symptoms None    Comments Pt's first day in the CRP2 program    Duration Continue with 30 min of aerobic exercise without signs/symptoms of physical distress.    Intensity THRR unchanged      Progression   Progression Continue to progress workloads to maintain intensity without signs/symptoms of physical distress.    Average METs 2.1      Resistance Training   Training Prescription No    Weight No weights on wednesdays      Interval Training   Interval Training No      Recumbant Bike   Level 1    RPM 54    Watts 12    Minutes 15    METs 1.9      NuStep   Level 1    SPM 81    Minutes 15    METs 2.2             Nutrition:  Target Goals: Understanding of nutrition guidelines, daily intake of sodium 1500mg , cholesterol 200mg , calories 30% from fat and 7% or less from saturated fats, daily to have 5 or more servings of fruits and vegetables.  Biometrics:  Pre Biometrics - 06/23/23 1030       Pre Biometrics   Waist Circumference 36.5 inches    Hip Circumference 44 inches    Waist to Hip Ratio 0.83 %    Triceps Skinfold 15 mm    % Body Fat 37.4 %    Grip Strength 23 kg    Flexibility 10 in    Single Leg Stand 11.5 seconds              Nutrition Therapy Plan and Nutrition Goals:  Nutrition Therapy & Goals - 06/30/23 1533       Nutrition Therapy   Diet Heart Healthy Diet    Drug/Food Interactions Statins/Certain Fruits      Personal Nutrition Goals   Nutrition Goal Patient to identify strategies for reducing cardiovascular risk by attending the [employer] education and nutrition series weekly.    Personal Goal #2 Patient to improve diet quality by using the plate method as a guide for meal planning to  include lean protein/plant protein, fruits, vegetables, whole grains, nonfat dairy as part of a well-balanced diet.    Comments Tyanna has medical history of HTN, CAD, STEMI, renal cell carcinmoa s/p partial L nephrectomy. She has been taking wegovy for weight loss; per documentation, she is down ~45# over the last year (91.4kg on 05/21/2022). LDL remains above goal of <16. A1c is well controlled in a normal range, previously in a prediabetic range but has improved with weight loss/wegovy. Patient will benefit from participation in intensive cardiac rehab for nutrition, exercise, and lifestyle modification.      Intervention Plan   Intervention Prescribe, educate and counsel regarding individualized specific dietary modifications aiming towards targeted core components such as weight, hypertension, lipid management, diabetes, heart failure and other comorbidities.;Nutrition handout(s) given to patient.    Expected Outcomes Short Term Goal: Understand basic principles of dietary content, such as calories, fat, sodium, cholesterol and nutrients.;Long Term Goal: Adherence to prescribed nutrition plan.             Nutrition Assessments:  Nutrition  Assessments - 07/04/23 1408       Rate Your Plate Scores   Pre Score 56            MEDIFICTS Score Key: >=70 Need to make dietary changes  40-70 Heart Healthy Diet <= 40 Therapeutic Level Cholesterol Diet   Flowsheet Row INTENSIVE CARDIAC REHAB from 07/04/2023 in [facility] for Heart, Vascular, & Lung Health  Picture Your Plate Total Score on Admission 56      Picture Your Plate Scores: <16 Unhealthy dietary pattern with much room for improvement. 41-50 Dietary pattern unlikely to meet recommendations for good health and room for improvement. 51-60 More healthful dietary pattern, with some room for improvement.  >60 Healthy dietary pattern, although there may be some specific behaviors that could be improved.     Nutrition Goals Re-Evaluation:  Nutrition Goals Re-Evaluation     Row Name 06/30/23 1533             Goals   Current Weight 156 lb 1.4 oz (70.8 kg)       Comment LDL 106, A1c WNL       Expected Outcome Anyra has medical history of HTN, CAD, STEMI, renal cell carcinmoa s/p partial L nephrectomy. She has been taking wegovy for weight loss; per documentation, she is down ~45# over the last year (91.4kg on 05/21/2022). LDL remains above goal of <10. A1c is well controlled in a normal range, previously in a prediabetic range but has improved with weight loss/wegovy. Patient will benefit from participation in intensive cardiac rehab for nutrition, exercise, and lifestyle modification.                Nutrition Goals Re-Evaluation:  Nutrition Goals Re-Evaluation     Row Name 06/30/23 1533             Goals   Current Weight 156 lb 1.4 oz (70.8 kg)       Comment LDL 106, A1c WNL       Expected Outcome Blondie has medical history of HTN, CAD, STEMI, renal cell carcinmoa s/p partial L nephrectomy. She has been taking wegovy for weight loss; per documentation, she is down ~45# over the last year (91.4kg on 05/21/2022). LDL remains above goal of <96. A1c is well controlled in a normal range, previously in a prediabetic range but has improved with weight loss/wegovy. Patient will benefit from participation in intensive cardiac rehab for nutrition, exercise, and lifestyle modification.                Nutrition Goals Discharge (Final Nutrition Goals Re-Evaluation):  Nutrition Goals Re-Evaluation - 06/30/23 1533       Goals   Current Weight 156 lb 1.4 oz (70.8 kg)    Comment LDL 106, A1c WNL    Expected Outcome Teasha has medical history of HTN, CAD, STEMI, renal cell carcinmoa s/p partial L nephrectomy. She has been taking wegovy for weight loss; per documentation, she is down ~45# over the last year (91.4kg on 05/21/2022). LDL remains above goal of <04. A1c is well controlled in a  normal range, previously in a prediabetic range but has improved with weight loss/wegovy. Patient will benefit from participation in intensive cardiac rehab for nutrition, exercise, and lifestyle modification.             Psychosocial: Target Goals: Acknowledge presence or absence of significant depression and/or stress, maximize coping skills, provide positive support system. Participant is able to verbalize types and ability to use  techniques and skills needed for reducing stress and depression.  Initial Review & Psychosocial Screening:  Initial Psych Review & Screening - 06/23/23 1604       Initial Review   Current issues with Current Depression;History of Depression;Current Anxiety/Panic;Current Stress Concerns    Source of Stress Concerns Chronic Illness      Family Dynamics   Good Support System? Yes      Barriers   Psychosocial barriers to participate in program The patient should benefit from training in stress management and relaxation.;Psychosocial barriers identified (see note)      Screening Interventions   Interventions Encouraged to exercise;To provide support and resources with identified psychosocial needs;Provide feedback about the scores to participant;Program counselor consult    Expected Outcomes Short Term goal: Utilizing psychosocial counselor, staff and physician to assist with identification of specific Stressors or current issues interfering with healing process. Setting desired goal for each stressor or current issue identified.;Long Term Goal: Stressors or current issues are controlled or eliminated.;Short Term goal: Identification and review with participant of any Quality of Life or Depression concerns found by scoring the questionnaire.;Long Term goal: The participant improves quality of Life and PHQ9 Scores as seen by post scores and/or verbalization of changes   Nurse aware and will provide counseling resources for pt next week when she comes for exercise.             Quality of Life Scores:  Quality of Life - 06/23/23 1511       Quality of Life   Select Quality of Life      Quality of Life Scores   Health/Function Pre 24 %    Socioeconomic Pre 30 %    Psych/Spiritual Pre 24 %    Family Pre 30 %    GLOBAL Pre 26 %            Scores of 19 and below usually indicate a poorer quality of life in these areas.  A difference of  2-3 points is a clinically meaningful difference.  A difference of 2-3 points in the total score of the Quality of Life Index has been associated with significant improvement in overall quality of life, self-image, physical symptoms, and general health in studies assessing change in quality of life.  PHQ-9: Review Flowsheet  More data exists      06/27/2023 06/23/2023 11/17/2022 10/20/2022 07/22/2022  Depression screen PHQ 2/9  Decreased Interest 0 0 0 0 0  Down, Depressed, Hopeless 0 1 0 0 1  PHQ - 2 Score 0 1 0 0 1  Altered sleeping 0 0 - - -  Tired, decreased energy 1 1 - - -  Change in appetite 0 0 - - -  Feeling bad or failure about yourself  0 0 - - -  Trouble concentrating 0 0 - - -  Moving slowly or fidgety/restless 0 0 - - -  Suicidal thoughts 0 0 - - -  PHQ-9 Score 1 2 - - -  Difficult doing work/chores Not difficult at all Not difficult at all - - -   Interpretation of Total Score  Total Score Depression Severity:  1-4 = Minimal depression, 5-9 = Mild depression, 10-14 = Moderate depression, 15-19 = Moderately severe depression, 20-27 = Severe depression   Psychosocial Evaluation and Intervention:   Psychosocial Re-Evaluation:  Psychosocial Re-Evaluation     Row Name 06/30/23 [phone]             Psychosocial Re-Evaluation   Current issues  with Current Depression;History of Depression;Current Stress Concerns;Current Anxiety/Panic;Current Psychotropic Meds       Comments Pt has a history of depression and says that the current antidepressants she is taking is controlling her  depression. Kenishia says that she has felt  anxious since her [name] and is says she worried if she travels out of [place] will she get the same care although [place] attend church in [place]. Reassured the patient that our staff is adequately trained and will provide good care to her while she participates in cardiac rehab. Shirla says that she has received counseling in the past and would like to talk to a counselor. Will send referral to [facility]. Will notify patient's primary care provider Dr Claiborne Billings.       Expected Outcomes Renee will have decreased or controlled depression/ anxiety  upon completion of cardiac       Interventions Therapist referral;Stress management education;Encouraged to attend Cardiac Rehabilitation for the exercise;Physician referral;Relaxation education       Continue Psychosocial Services  Follow up required by staff         Initial Review   Source of Stress Concerns Chronic Illness;Unable to perform yard/household activities;Poor Coping Skills       Comments Will continue to monitor and offer support as needed.                Psychosocial Discharge (Final Psychosocial Re-Evaluation):  Psychosocial Re-Evaluation - 06/30/23 2841       Psychosocial Re-Evaluation   Current issues with Current Depression;History of Depression;Current Stress Concerns;Current Anxiety/Panic;Current Psychotropic Meds    Comments Pt has a history of depression and says that the current antidepressants she is taking is controlling her depression. Lenise says that she has felt  anxious since her [name] and is says she worried if she travels out of [employer] will she get the same care although [place] attend church in [place]. Reassured the patient that our staff is adequately trained and will provide good care to her while she participates in cardiac rehab. Geovanna says that she has received counseling in the past and would like to talk to a counselor. Will  send referral to [facility]. Will notify patient's primary care provider Dr Claiborne Billings.    Expected Outcomes Renee will have decreased or controlled depression/ anxiety  upon completion of cardiac    Interventions Therapist referral;Stress management education;Encouraged to attend Cardiac Rehabilitation for the exercise;Physician referral;Relaxation education    Continue Psychosocial Services  Follow up required by staff      Initial Review   Source of Stress Concerns Chronic Illness;Unable to perform yard/household activities;Poor Coping Skills    Comments Will continue to monitor and offer support as needed.             Vocational Rehabilitation: Provide vocational rehab assistance to qualifying candidates.   Vocational Rehab Evaluation & Intervention:  Vocational Rehab - 06/23/23 1528       Initial Vocational Rehab Evaluation & Intervention   Assessment shows need for Vocational Rehabilitation No   Pt is retired            Education: Education Goals: Education classes will be provided on a weekly basis, covering required topics. Participant will state understanding/return demonstration of topics presented.    Education     Row Name 06/29/23 1300     Education   Cardiac Education Topics Pritikin   Customer service manager  Weekly Topic Comforting Weekend Breakfasts   Instruction Review Code 1- Verbalizes Understanding   Class Start Time 1145   Class Stop Time 1228   Class Time Calculation (min) 43 min    Row Name 07/01/23 1300     Education   Cardiac Education Topics Pritikin   Select Core Videos     Core Videos   Educator Dietitian   Select Nutrition   Nutrition Dining Out - Part 1   Instruction Review Code 1- Verbalizes Understanding   Class Start Time 1145   Class Stop Time 1222   Class Time Calculation (min) 37 min    Row Name 07/04/23 1100     Education   Cardiac Education Topics Pritikin    Select Core Videos     Core Videos   Educator Exercise Physiologist   Select Exercise Education   Exercise Education Biomechanial Limitations   Instruction Review Code 1- Verbalizes Understanding   Class Start Time 1145   Class Stop Time 1218   Class Time Calculation (min) 33 min            Core Videos: Exercise    Move It!  Clinical staff conducted group or individual video education with verbal and written material and guidebook.  Patient learns the recommended Pritikin exercise program. Exercise with the goal of living a long, healthy life. Some of the health benefits of exercise include controlled diabetes, healthier blood pressure levels, improved cholesterol levels, improved heart and lung capacity, improved sleep, and better body composition. Everyone should speak with their doctor before starting or changing an exercise routine.  Biomechanical Limitations Clinical staff conducted group or individual video education with verbal and written material and guidebook.  Patient learns how biomechanical limitations can impact exercise and how we can mitigate and possibly overcome limitations to have an impactful and balanced exercise routine.  Body Composition Clinical staff conducted group or individual video education with verbal and written material and guidebook.  Patient learns that body composition (ratio of muscle mass to fat mass) is a key component to assessing overall fitness, rather than body weight alone. Increased fat mass, especially visceral belly fat, can put [place] at increased risk for metabolic syndrome, type 2 diabetes, heart disease, and even death. It is recommended to combine diet and exercise (cardiovascular and resistance training) to improve your body composition. Seek guidance from your physician and exercise physiologist before implementing an exercise routine.  Exercise Action Plan Clinical staff conducted group or individual video education with verbal and  written material and guidebook.  Patient learns the recommended strategies to achieve and enjoy long-term exercise adherence, including variety, self-motivation, self-efficacy, and positive decision making. Benefits of exercise include fitness, good health, weight management, more energy, better sleep, less stress, and overall well-being.  Medical   Heart Disease Risk Reduction Clinical staff conducted group or individual video education with verbal and written material and guidebook.  Patient learns our heart is our most vital organ as it circulates oxygen, nutrients, white blood cells, and hormones throughout the entire body, and carries waste away. Data supports a plant-based eating plan like the Pritikin Program for its effectiveness in slowing progression of and reversing heart disease. The video provides a number of recommendations to address heart disease.   Metabolic Syndrome and Belly Fat  Clinical staff conducted group or individual video education with verbal and written material and guidebook.  Patient learns what metabolic syndrome is, how it leads to heart disease, and how one can reverse it  and keep it from coming back. You have metabolic syndrome if you have 3 of the following 5 criteria: abdominal obesity, high blood pressure, high triglycerides, low HDL cholesterol, and high blood sugar.  Hypertension and Heart Disease Clinical staff conducted group or individual video education with verbal and written material and guidebook.  Patient learns that high blood pressure, or hypertension, is very common in the [place]. Hypertension is largely due to excessive salt intake, but other important risk factors include being overweight, physical inactivity, drinking too much alcohol, smoking, and not eating enough potassium from fruits and vegetables. High blood pressure is a leading risk factor for heart attack, stroke, congestive heart failure, dementia, kidney failure, and premature  death. Long-term effects of excessive salt intake include stiffening of the arteries and thickening of heart muscle and organ damage. Recommendations include ways to reduce hypertension and the risk of heart disease.  Diseases of Our Time - Focusing on Diabetes Clinical staff conducted group or individual video education with verbal and written material and guidebook.  Patient learns why the best way to stop diseases of our time is prevention, through food and other lifestyle changes. Medicine (such as prescription pills and surgeries) is often only a Band-Aid on the problem, not a long-term solution. Most common diseases of our time include obesity, type 2 diabetes, hypertension, heart disease, and cancer. The Pritikin Program is recommended and has been proven to help reduce, reverse, and/or prevent the damaging effects of metabolic syndrome.  Nutrition   Overview of the Pritikin Eating Plan  Clinical staff conducted group or individual video education with verbal and written material and guidebook.  Patient learns about the Pritikin Eating Plan for disease risk reduction. The Pritikin Eating Plan emphasizes a wide variety of unrefined, minimally-processed carbohydrates, like fruits, vegetables, whole grains, and legumes. Go, Caution, and Stop food choices are explained. Plant-based and lean animal proteins are emphasized. Rationale provided for low sodium intake for blood pressure control, low added sugars for blood sugar stabilization, and low added fats and oils for coronary artery disease risk reduction and weight management.  Calorie Density  Clinical staff conducted group or individual video education with verbal and written material and guidebook.  Patient learns about calorie density and how it impacts the Pritikin Eating Plan. Knowing the characteristics of the food you choose will help you decide whether those foods will lead to weight gain or weight loss, and whether you want to consume  more or less of them. Weight loss is usually a side effect of the Pritikin Eating Plan because of its focus on low calorie-dense foods.  Label Reading  Clinical staff conducted group or individual video education with verbal and written material and guidebook.  Patient learns about the Pritikin recommended label reading guidelines and corresponding recommendations regarding calorie density, added sugars, sodium content, and whole grains.  Dining Out - Part 1  Clinical staff conducted group or individual video education with verbal and written material and guidebook.  Patient learns that restaurant meals can be sabotaging because they can be so high in calories, fat, sodium, and/or sugar. Patient learns recommended strategies on how to positively address this and avoid unhealthy pitfalls.  Facts on Fats  Clinical staff conducted group or individual video education with verbal and written material and guidebook.  Patient learns that lifestyle modifications can be just as effective, if not more so, as many medications for lowering your risk of heart disease. A Pritikin lifestyle can help to reduce your risk  of inflammation and atherosclerosis (cholesterol build-up, or plaque, in the artery walls). Lifestyle interventions such as dietary choices and physical activity address the cause of atherosclerosis. A review of the types of fats and their impact on blood cholesterol levels, along with dietary recommendations to reduce fat intake is also included.  Nutrition Action Plan  Clinical staff conducted group or individual video education with verbal and written material and guidebook.  Patient learns how to incorporate Pritikin recommendations into their lifestyle. Recommendations include planning and keeping personal health goals in mind as an important part of their success.  Healthy Mind-Set    Healthy Minds, Bodies, Hearts  Clinical staff conducted group or individual video education with verbal and  written material and guidebook.  Patient learns how to identify when they are stressed. Video will discuss the impact of that stress, as well as the many benefits of stress management. Patient will also be introduced to stress management techniques. The way we think, act, and feel has an impact on our hearts.  How Our Thoughts Can Heal Our Hearts  Clinical staff conducted group or individual video education with verbal and written material and guidebook.  Patient learns that negative thoughts can cause depression and anxiety. This can result in negative lifestyle behavior and serious health problems. Cognitive behavioral therapy is an effective method to help control our thoughts in order to change and improve our emotional outlook.  Additional Videos:  Exercise    Improving Performance  Clinical staff conducted group or individual video education with verbal and written material and guidebook.  Patient learns to use a non-linear approach by alternating intensity levels and lengths of time spent exercising to help burn more calories and lose more body fat. Cardiovascular exercise helps improve heart health, metabolism, hormonal balance, blood sugar control, and recovery from fatigue. Resistance training improves strength, endurance, balance, coordination, reaction time, metabolism, and muscle mass. Flexibility exercise improves circulation, posture, and balance. Seek guidance from your physician and exercise physiologist before implementing an exercise routine and learn your capabilities and proper form for all exercise.  Introduction to Yoga  Clinical staff conducted group or individual video education with verbal and written material and guidebook.  Patient learns about yoga, a discipline of the coming together of mind, breath, and body. The benefits of yoga include improved flexibility, improved range of motion, better posture and core strength, increased lung function, weight loss, and positive  self-image. Yoga's heart health benefits include lowered blood pressure, healthier heart rate, decreased cholesterol and triglyceride levels, improved immune function, and reduced stress. Seek guidance from your physician and exercise physiologist before implementing an exercise routine and learn your capabilities and proper form for all exercise.  Medical   Aging: Enhancing Your Quality of Life  Clinical staff conducted group or individual video education with verbal and written material and guidebook.  Patient learns key strategies and recommendations to stay in good physical health and enhance quality of life, such as prevention strategies, having an advocate, securing a Health Care Proxy and Power of Attorney, and keeping a list of medications and system for tracking them. It also discusses how to avoid risk for bone loss.  Biology of Weight Control  Clinical staff conducted group or individual video education with verbal and written material and guidebook.  Patient learns that weight gain occurs because we consume more calories than we burn (eating more, moving less). Even if your body weight is normal, you may have higher ratios of fat compared to muscle mass.  Too much body fat puts you at increased risk for cardiovascular disease, heart attack, stroke, type 2 diabetes, and obesity-related cancers. In addition to exercise, following the Pritikin Eating Plan can help reduce your risk.  Decoding Lab Results  Clinical staff conducted group or individual video education with verbal and written material and guidebook.  Patient learns that lab test reflects one measurement whose values change over time and are influenced by many factors, including medication, stress, sleep, exercise, food, hydration, pre-existing medical conditions, and more. It is recommended to use the knowledge from this video to become more involved with your lab results and evaluate your numbers to speak with your  doctor.   Diseases of Our Time - Overview  Clinical staff conducted group or individual video education with verbal and written material and guidebook.  Patient learns that according to the CDC, 50% to 70% of chronic diseases (such as obesity, type 2 diabetes, elevated lipids, hypertension, and heart disease) are avoidable through lifestyle improvements including healthier food choices, listening to satiety cues, and increased physical activity.  Sleep Disorders Clinical staff conducted group or individual video education with verbal and written material and guidebook.  Patient learns how good quality and duration of sleep are important to overall health and well-being. Patient also learns about sleep disorders and how they impact health along with recommendations to address them, including discussing with a physician.  Nutrition  Dining Out - Part 2 Clinical staff conducted group or individual video education with verbal and written material and guidebook.  Patient learns how to plan ahead and communicate in order to maximize their dining experience in a healthy and nutritious manner. Included are recommended food choices based on the type of restaurant the patient is visiting.   Fueling a Banker conducted group or individual video education with verbal and written material and guidebook.  There is a strong connection between our food choices and our health. Diseases like obesity and type 2 diabetes are very prevalent and are in large-part due to lifestyle choices. The Pritikin Eating Plan provides plenty of food and hunger-curbing satisfaction. It is easy to follow, affordable, and helps reduce health risks.  Menu Workshop  Clinical staff conducted group or individual video education with verbal and written material and guidebook.  Patient learns that restaurant meals can sabotage health goals because they are often packed with calories, fat, sodium, and sugar.  Recommendations include strategies to plan ahead and to communicate with the manager, chef, or server to help order a healthier meal.  Planning Your Eating Strategy  Clinical staff conducted group or individual video education with verbal and written material and guidebook.  Patient learns about the Pritikin Eating Plan and its benefit of reducing the risk of disease. The Pritikin Eating Plan does not focus on calories. Instead, it emphasizes high-quality, nutrient-rich foods. By knowing the characteristics of the foods, we choose, we can determine their calorie density and make informed decisions.  Targeting Your Nutrition Priorities  Clinical staff conducted group or individual video education with verbal and written material and guidebook.  Patient learns that lifestyle habits have a tremendous impact on disease risk and progression. This video provides eating and physical activity recommendations based on your personal health goals, such as reducing LDL cholesterol, losing weight, preventing or controlling type 2 diabetes, and reducing high blood pressure.  Vitamins and Minerals  Clinical staff conducted group or individual video education with verbal and written material and guidebook.  Patient learns different  ways to obtain key vitamins and minerals, including through a recommended healthy diet. It is important to discuss all supplements you take with your doctor.   Healthy Mind-Set    Smoking Cessation  Clinical staff conducted group or individual video education with verbal and written material and guidebook.  Patient learns that cigarette smoking and tobacco addiction pose a serious health risk which affects millions of people. Stopping smoking will significantly reduce the risk of heart disease, lung disease, and many forms of cancer. Recommended strategies for quitting are covered, including working with your doctor to develop a successful plan.  Culinary   Becoming a Corporate investment banker conducted group or individual video education with verbal and written material and guidebook.  Patient learns that cooking at home can be healthy, cost-effective, quick, and puts them in control. Keys to cooking healthy recipes will include looking at your recipe, assessing your equipment needs, planning ahead, making it simple, choosing cost-effective seasonal ingredients, and limiting the use of added fats, salts, and sugars.  Cooking - Breakfast and Snacks  Clinical staff conducted group or individual video education with verbal and written material and guidebook.  Patient learns how important breakfast is to satiety and nutrition through the entire day. Recommendations include key foods to eat during breakfast to help stabilize blood sugar levels and to prevent overeating at meals later in the day. Planning ahead is also a key component.  Cooking - Educational psychologist conducted group or individual video education with verbal and written material and guidebook.  Patient learns eating strategies to improve overall health, including an approach to cook more at home. Recommendations include thinking of animal protein as a side on your plate rather than center stage and focusing instead on lower calorie dense options like vegetables, fruits, whole grains, and plant-based proteins, such as beans. Making sauces in large quantities to freeze for later and leaving the skin on your vegetables are also recommended to maximize your experience.  Cooking - Healthy Salads and Dressing Clinical staff conducted group or individual video education with verbal and written material and guidebook.  Patient learns that vegetables, fruits, whole grains, and legumes are the foundations of the Pritikin Eating Plan. Recommendations include how to incorporate each of these in flavorful and healthy salads, and how to create homemade salad dressings. Proper handling of ingredients is also covered.  Cooking - Soups and [employer] - Soups and Desserts Clinical staff conducted group or individual video education with verbal and written material and guidebook.  Patient learns that Pritikin soups and desserts make for easy, nutritious, and delicious snacks and meal components that are low in sodium, fat, sugar, and calorie density, while high in vitamins, minerals, and filling fiber. Recommendations include simple and healthy ideas for soups and desserts.   Overview     The Pritikin Solution Program Overview Clinical staff conducted group or individual video education with verbal and written material and guidebook.  Patient learns that the results of the Pritikin Program have been documented in more than 100 articles published in peer-reviewed journals, and the benefits include reducing risk factors for (and, in some cases, even reversing) high cholesterol, high blood pressure, type 2 diabetes, obesity, and more! An overview of the three key pillars of the Pritikin Program will be covered: eating well, doing regular exercise, and having a healthy mind-set.  WORKSHOPS  Exercise: Exercise Basics: Building Your Action Plan Clinical staff led group instruction and group discussion with  PowerPoint presentation and patient guidebook. To enhance the learning environment the use of posters, models and videos may be added. At the conclusion of this workshop, patients will comprehend the difference between physical activity and exercise, as well as the benefits of incorporating both, into their routine. Patients will understand the FITT (Frequency, Intensity, Time, and Type) principle and how to use it to build an exercise action plan. In addition, safety concerns and other considerations for exercise and cardiac rehab will be addressed by the presenter. The purpose of this lesson is to promote a comprehensive and effective weekly exercise routine in order to improve patients' overall level of  fitness.   Managing Heart Disease: Your Path to a Healthier Heart Clinical staff led group instruction and group discussion with PowerPoint presentation and patient guidebook. To enhance the learning environment the use of posters, models and videos may be added.At the conclusion of this workshop, patients will understand the anatomy and physiology of the heart. Additionally, they will understand how Pritikin's three pillars impact the risk factors, the progression, and the management of heart disease.  The purpose of this lesson is to provide a high-level overview of the heart, heart disease, and how the Pritikin lifestyle positively impacts risk factors.  Exercise Biomechanics Clinical staff led group instruction and group discussion with PowerPoint presentation and patient guidebook. To enhance the learning environment the use of posters, models and videos may be added. Patients will learn how the structural parts of their bodies function and how these functions impact their daily activities, movement, and exercise. Patients will learn how to promote a neutral spine, learn how to manage pain, and identify ways to improve their physical movement in order to promote healthy living. The purpose of this lesson is to expose patients to common physical limitations that impact physical activity. Participants will learn practical ways to adapt and manage aches and pains, and to minimize their effect on regular exercise. Patients will learn how to maintain good posture while sitting, walking, and lifting.  Balance Training and Fall Prevention  Clinical staff led group instruction and group discussion with PowerPoint presentation and patient guidebook. To enhance the learning environment the use of posters, models and videos may be added. At the conclusion of this workshop, patients will understand the importance of their sensorimotor skills (vision, proprioception, and the vestibular system)  in maintaining their ability to balance as they age. Patients will apply a variety of balancing exercises that are appropriate for their current level of function. Patients will understand the common causes for poor balance, possible solutions to these problems, and ways to modify their physical environment in order to minimize their fall risk. The purpose of this lesson is to teach patients about the importance of maintaining balance as they age and ways to minimize their risk of falling.  WORKSHOPS   Nutrition:  Fueling a Ship broker led group instruction and group discussion with PowerPoint presentation and patient guidebook. To enhance the learning environment the use of posters, models and videos may be added. Patients will review the foundational principles of the Pritikin Eating Plan and understand what constitutes a serving size in each of the food groups. Patients will also learn Pritikin-friendly foods that are better choices when away from home and review make-ahead meal and snack options. Calorie density will be reviewed and applied to three nutrition priorities: weight maintenance, weight loss, and weight gain. The purpose of this lesson is to reinforce (in a group setting) the key concepts  around what patients are recommended to eat and how to apply these guidelines when away from home by planning and selecting Pritikin-friendly options. Patients will understand how calorie density may be adjusted for different weight management goals.  Mindful Eating  Clinical staff led group instruction and group discussion with PowerPoint presentation and patient guidebook. To enhance the learning environment the use of posters, models and videos may be added. Patients will briefly review the concepts of the Pritikin Eating Plan and the importance of low-calorie dense foods. The concept of mindful eating will be introduced as well as the importance of paying attention to internal hunger  signals. Triggers for non-hunger eating and techniques for dealing with triggers will be explored. The purpose of this lesson is to provide patients with the opportunity to review the basic principles of the Pritikin Eating Plan, discuss the value of eating mindfully and how to measure internal cues of hunger and fullness using the Hunger Scale. Patients will also discuss reasons for non-hunger eating and learn strategies to use for controlling emotional eating.  Targeting Your Nutrition Priorities Clinical staff led group instruction and group discussion with PowerPoint presentation and patient guidebook. To enhance the learning environment the use of posters, models and videos may be added. Patients will learn how to determine their genetic susceptibility to disease by reviewing their family history. Patients will gain insight into the importance of diet as part of an overall healthy lifestyle in mitigating the impact of genetics and other environmental insults. The purpose of this lesson is to provide patients with the opportunity to assess their personal nutrition priorities by looking at their family history, their own health history and current risk factors. Patients will also be able to discuss ways of prioritizing and modifying the Pritikin Eating Plan for their highest risk areas  Menu  Clinical staff led group instruction and group discussion with PowerPoint presentation and patient guidebook. To enhance the learning environment the use of posters, models and videos may be added. Using menus brought in from [employer], or printed from [employer], patients will apply the Pritikin dining out guidelines that were presented in the [employer] video. Patients will also be able to practice these guidelines in a variety of provided scenarios. The purpose of this lesson is to provide patients with the opportunity to practice hands-on learning of the Pritikin Dining Out guidelines  with actual menus and practice scenarios.  Label Reading Clinical staff led group instruction and group discussion with PowerPoint presentation and patient guidebook. To enhance the learning environment the use of posters, models and videos may be added. Patients will review and discuss the Pritikin label reading guidelines presented in [employer]ritikin's Label Reading Educational series video. Using fool labels brought in from local grocery stores and markets, patients will apply the label reading guidelines and determine if the packaged food meet the Pritikin guidelines. The purpose of this lesson is to provide patients with the opportunity to review, discuss, and practice hands-on learning of the Pritikin Label Reading guidelines with actual packaged food labels. Cooking School  Pritikin's [employer] are designed to teach patients ways to prepare quick, simple, and affordable recipes at home. The importance of nutrition's role in chronic disease risk reduction is reflected in its emphasis in the overall Pritikin program. By learning how to prepare essential core Pritikin Eating Plan recipes, patients will increase control over what they eat; be able to customize the flavor of foods without the use of added salt, sugar,  or fat; and improve the quality of the food they consume. By learning a set of core recipes which are easily assembled, quickly prepared, and affordable, patients are more likely to prepare more healthy foods at home. These workshops focus on convenient breakfasts, simple entres, side dishes, and desserts which can be prepared with minimal effort and are consistent with nutrition recommendations for cardiovascular risk reduction. Cooking [employer] are taught by a Armed forces logistics/support/administrative officer (RD) who has been trained by the [employer]. The chef or RD has a clear understanding of the importance of minimizing - if not completely eliminating - added fat, sugar, and  sodium in recipes. Throughout the series of Cooking School Workshop sessions, patients will learn about healthy ingredients and efficient methods of cooking to build confidence in their capability to prepare    Cooking School weekly topics:  Adding Flavor- Sodium-Free  Fast and Healthy Breakfasts  Powerhouse Plant-Based Proteins  Satisfying Salads and Dressings  Simple Sides and Sauces  International Cuisine-Spotlight on the [employer] Zones  Delicious Desserts  Savory Soups  [employer] - Meals in a Astronomer Appetizers and Snacks  Comforting Weekend Breakfasts  One-Pot Wonders   Fast Evening Meals  Landscape architect Your Pritikin Plate  WORKSHOPS   Healthy Mindset (Psychosocial):  Focused Goals, Sustainable Changes Clinical staff led group instruction and group discussion with PowerPoint presentation and patient guidebook. To enhance the learning environment the use of posters, models and videos may be added. Patients will be able to apply effective goal setting strategies to establish at least one personal goal, and then take consistent, meaningful action toward that goal. They will learn to identify common barriers to achieving personal goals and develop strategies to overcome them. Patients will also gain an understanding of how our mind-set can impact our ability to achieve goals and the importance of cultivating a positive and growth-oriented mind-set. The purpose of this lesson is to provide patients with a deeper understanding of how to set and achieve personal goals, as well as the tools and strategies needed to overcome common obstacles which may arise along the way.  From Head to Heart: The Power of a Healthy Outlook  Clinical staff led group instruction and group discussion with PowerPoint presentation and patient guidebook. To enhance the learning environment the use of posters, models and videos may be added. Patients will be able to recognize and  describe the impact of emotions and mood on physical health. They will discover the importance of self-care and explore self-care practices which may work for them. Patients will also learn how to utilize the 4 C's to cultivate a healthier outlook and better manage stress and challenges. The purpose of this lesson is to demonstrate to patients how a healthy outlook is an essential part of maintaining good health, especially as they continue their cardiac rehab journey.  Healthy Sleep for a Healthy Heart Clinical staff led group instruction and group discussion with PowerPoint presentation and patient guidebook. To enhance the learning environment the use of posters, models and videos may be added. At the conclusion of this workshop, patients will be able to demonstrate knowledge of the importance of sleep to overall health, well-being, and quality of life. They will understand the symptoms of, and treatments for, common sleep disorders. Patients will also be able to identify daytime and nighttime behaviors which impact sleep, and they will be able to apply these tools to help manage sleep-related challenges. The purpose of this  lesson is to provide patients with a general overview of sleep and outline the importance of quality sleep. Patients will learn about a few of the most common sleep disorders. Patients will also be introduced to the concept of "sleep hygiene," and discover ways to self-manage certain sleeping problems through simple daily behavior changes. Finally, the workshop will motivate patients by clarifying the links between quality sleep and their goals of heart-healthy living.   Recognizing and Reducing Stress Clinical staff led group instruction and group discussion with PowerPoint presentation and patient guidebook. To enhance the learning environment the use of posters, models and videos may be added. At the conclusion of this workshop, patients will be able to understand the types of stress  reactions, differentiate between acute and chronic stress, and recognize the impact that chronic stress has on their health. They will also be able to apply different coping mechanisms, such as reframing negative self-talk. Patients will have the opportunity to practice a variety of stress management techniques, such as deep abdominal breathing, progressive muscle relaxation, and/or guided imagery.  The purpose of this lesson is to educate patients on the role of stress in their lives and to provide healthy techniques for coping with it.  Learning Barriers/Preferences:  Learning Barriers/Preferences - 06/23/23 1523       Learning Barriers/Preferences   Learning Barriers None    Learning Preferences Computer/Internet;Written Material             Education Topics:  Knowledge Questionnaire Score:  Knowledge Questionnaire Score - 06/23/23 1512       Knowledge Questionnaire Score   Pre Score 22/24             Core Components/Risk Factors/Patient Goals at Admission:  Personal Goals and Risk Factors at Admission - 06/23/23 1607       Core Components/Risk Factors/Patient Goals on Admission   Hypertension Yes    Intervention Provide education on lifestyle modifcations including regular physical activity/exercise, weight management, moderate sodium restriction and increased consumption of fresh fruit, vegetables, and low fat dairy, alcohol moderation, and smoking cessation.;Monitor prescription use compliance.    Expected Outcomes Short Term: Continued assessment and intervention until BP is < 140/43mm HG in hypertensive participants. < 130/54mm HG in hypertensive participants with diabetes, heart failure or chronic kidney disease.;Long Term: Maintenance of blood pressure at goal levels.    Lipids Yes    Intervention Provide education and support for participant on nutrition & aerobic/resistive exercise along with prescribed medications to achieve LDL 70mg , HDL >40mg .    Expected  Outcomes Short Term: Participant states understanding of desired cholesterol values and is compliant with medications prescribed. Participant is following exercise prescription and nutrition guidelines.;Long Term: Cholesterol controlled with medications as prescribed, with individualized exercise RX and with personalized nutrition plan. Value goals: LDL < 70mg , HDL > 40 mg.    Stress Yes    Intervention Offer individual and/or small group education and counseling on adjustment to heart disease, stress management and health-related lifestyle change. Teach and support self-help strategies.;Refer participants experiencing significant psychosocial distress to appropriate mental health specialists for further evaluation and treatment. When possible, include family members and significant others in education/counseling sessions.    Expected Outcomes Short Term: Participant demonstrates changes in health-related behavior, relaxation and other stress management skills, ability to obtain effective social support, and compliance with psychotropic medications if prescribed.;Long Term: Emotional wellbeing is indicated by absence of clinically significant psychosocial distress or social isolation.    Personal Goal Other Yes  Personal Goal Short term: knowledge on heart health, Ex routine that she can stick with Long term: Balance, flexibility and stamina    Intervention Will continue to monitor pt and progress workloads as tolerated without sign or symptom    Expected Outcomes Pt will achieve her goals and gain strength             Core Components/Risk Factors/Patient Goals Review:   Goals and Risk Factor Review     Row Name 06/30/23 1039 07/05/23 0931           Core Components/Risk Factors/Patient Goals Review   Personal Goals Review Weight Management/Obesity;Lipids;Stress;Hypertension Weight Management/Obesity;Lipids;Stress;Hypertension      Review Azaela started cardiac rehab on 06/29/23. Renessa did  well with exercise. Vital signs were stable. Kimberlyann started cardiac rehab on 06/29/23. Ellese is off to a good start  with exercise. Vital signs have been stable.      Expected Outcomes Anahi will continue to participate in cardiac rehab for exercise, nutrtion and lifestyle modifications Christalynn will continue to participate in cardiac rehab for exercise, nutrtion and lifestyle modifications               Core Components/Risk Factors/Patient Goals at Discharge (Final Review):   Goals and Risk Factor Review - 07/05/23 0931       Core Components/Risk Factors/Patient Goals Review   Personal Goals Review Weight Management/Obesity;Lipids;Stress;Hypertension    Review Tegan started cardiac rehab on 06/29/23. Sheretta is off to a good start  with exercise. Vital signs have been stable.    Expected Outcomes Darrelyn will continue to participate in cardiac rehab for exercise, nutrtion and lifestyle modifications             ITP Comments:  ITP Comments     Row Name 06/23/23 1017 06/30/23 0956 07/05/23 0928       ITP Comments Armanda Magic, MD: Medical Director.  Introduction to the [facility] /  Intensive Cardiac Rehab.  Initial orientation packet reviewed with the patient. 30 Day ITP Review. Sabirin started cardiac rehab on 06/29/23. Cynthea did well with exercise. 30 Day ITP Review. Keni started cardiac rehab on 06/29/23. Sequoya is off to a good start with exercise.              Comments: See ITP comments.[name] RN BSN

## 2023-07-06 ENCOUNTER — Encounter (HOSPITAL_COMMUNITY)
Admission: RE | Admit: 2023-07-06 | Discharge: 2023-07-06 | Disposition: A | Discharge status: Home / Self Care | Source: Ambulatory Visit | Attending: Internal Medicine | Admitting: Internal Medicine

## 2023-07-06 DIAGNOSIS — I213 ST elevation (STEMI) myocardial infarction of unspecified site: Secondary | ICD-10-CM | POA: Diagnosis not present

## 2023-07-08 ENCOUNTER — Encounter (HOSPITAL_COMMUNITY)

## 2023-07-08 DIAGNOSIS — Z08 Encounter for follow-up examination after completed treatment for malignant neoplasm: Secondary | ICD-10-CM | POA: Diagnosis not present

## 2023-07-08 DIAGNOSIS — C642 Malignant neoplasm of left kidney, except renal pelvis: Secondary | ICD-10-CM | POA: Diagnosis not present

## 2023-07-08 DIAGNOSIS — C641 Malignant neoplasm of right kidney, except renal pelvis: Secondary | ICD-10-CM | POA: Diagnosis not present

## 2023-07-08 DIAGNOSIS — Z85528 Personal history of other malignant neoplasm of kidney: Secondary | ICD-10-CM | POA: Diagnosis not present

## 2023-07-08 DIAGNOSIS — Z905 Acquired absence of kidney: Secondary | ICD-10-CM | POA: Diagnosis not present

## 2023-07-11 ENCOUNTER — Encounter (HOSPITAL_COMMUNITY)
Admission: RE | Admit: 2023-07-11 | Discharge: 2023-07-11 | Disposition: A | Discharge status: Home / Self Care | Source: Ambulatory Visit | Attending: Internal Medicine | Admitting: Internal Medicine

## 2023-07-11 DIAGNOSIS — I213 ST elevation (STEMI) myocardial infarction of unspecified site: Secondary | ICD-10-CM

## 2023-07-13 ENCOUNTER — Encounter (HOSPITAL_COMMUNITY)
Admission: RE | Admit: 2023-07-13 | Discharge: 2023-07-13 | Disposition: A | Discharge status: Home / Self Care | Source: Ambulatory Visit | Attending: Internal Medicine | Admitting: Internal Medicine

## 2023-07-13 DIAGNOSIS — I213 ST elevation (STEMI) myocardial infarction of unspecified site: Secondary | ICD-10-CM

## 2023-07-15 ENCOUNTER — Encounter (HOSPITAL_COMMUNITY)
Admission: RE | Admit: 2023-07-15 | Discharge: 2023-07-15 | Disposition: A | Discharge status: Home / Self Care | Source: Ambulatory Visit | Attending: Internal Medicine | Admitting: Internal Medicine

## 2023-07-15 DIAGNOSIS — I213 ST elevation (STEMI) myocardial infarction of unspecified site: Secondary | ICD-10-CM | POA: Diagnosis not present

## 2023-07-18 ENCOUNTER — Encounter (HOSPITAL_COMMUNITY)
Admission: RE | Admit: 2023-07-18 | Discharge: 2023-07-18 | Disposition: A | Discharge status: Home / Self Care | Source: Ambulatory Visit | Attending: Internal Medicine | Admitting: Internal Medicine

## 2023-07-18 DIAGNOSIS — I213 ST elevation (STEMI) myocardial infarction of unspecified site: Secondary | ICD-10-CM | POA: Diagnosis not present

## 2023-07-18 NOTE — Progress Notes (Signed)
 Patient reported having intermittent non radiating dull chest pain an hour after coming from church yesterday. Cerra says the pain was non radiating. Denied nausea. Adriana denies having any chest pain today during exercise at cardiac rehab today. Blood pressure 130/70 heart rate 89. Telemetry rhythm Sinus. Will notify onsite provider Palmer Bobo NP for further input. Palmer Bobo NP reviewed patient's chart and has no further recommendations as long as the patient does not exertional dyspnea or angina. Will notify the patient and continue to monitor.Monte Antonio RN BSN

## 2023-07-20 ENCOUNTER — Encounter (HOSPITAL_COMMUNITY)
Admission: RE | Admit: 2023-07-20 | Discharge: 2023-07-20 | Disposition: A | Discharge status: Home / Self Care | Source: Ambulatory Visit | Attending: Internal Medicine | Admitting: Internal Medicine

## 2023-07-20 DIAGNOSIS — I213 ST elevation (STEMI) myocardial infarction of unspecified site: Secondary | ICD-10-CM

## 2023-07-22 ENCOUNTER — Encounter (HOSPITAL_COMMUNITY)
Admission: RE | Admit: 2023-07-22 | Discharge: 2023-07-22 | Disposition: A | Discharge status: Home / Self Care | Source: Ambulatory Visit | Attending: Internal Medicine

## 2023-07-22 DIAGNOSIS — I213 ST elevation (STEMI) myocardial infarction of unspecified site: Secondary | ICD-10-CM | POA: Diagnosis not present

## 2023-07-25 ENCOUNTER — Encounter (HOSPITAL_COMMUNITY)
Admission: RE | Admit: 2023-07-25 | Discharge: 2023-07-25 | Disposition: A | Discharge status: Home / Self Care | Source: Ambulatory Visit | Attending: Internal Medicine | Admitting: Internal Medicine

## 2023-07-25 DIAGNOSIS — I213 ST elevation (STEMI) myocardial infarction of unspecified site: Secondary | ICD-10-CM | POA: Diagnosis not present

## 2023-07-26 NOTE — Progress Notes (Signed)
 Cardiac Individual Treatment Plan  Patient Details  Name: [name] MRN: [id number] Date of Birth: 04/26/1960 Referring Provider:   Flowsheet Row INTENSIVE CARDIAC REHAB ORIENT from 06/23/2023 in [facility]  Referring Provider Dr. Alyssa Backbone, MD       Initial Encounter Date:  Flowsheet Row INTENSIVE CARDIAC REHAB ORIENT from 06/23/2023 in [facility]  Date 06/23/23       Visit Diagnosis: 05/31/23 ST elevation myocardial infarction (STEMI)  Patient's Home Medications on Admission:  Current Outpatient Medications:    albuterol  (PROVENTIL ) (2.5 MG/3ML) 0.083% nebulizer solution, Take 3 mLs (2.5 mg total) by nebulization every 6 (six) hours as needed for wheezing or shortness of breath., Disp: 75 mL, Rfl: 12   albuterol  (VENTOLIN  HFA) 108 (90 Base) MCG/ACT inhaler, USE 1 TO 2 INHALATIONS EVERY 6 HOURS AS NEEDED FOR SHORTNESS OF BREATH, Disp: 25.5 g, Rfl: 3   ALPRAZolam  (XANAX ) 0.5 MG tablet, Take 0.5 mg by mouth at bedtime., Disp: , Rfl:    aspirin  81 MG chewable tablet, Chew 1 tablet (81 mg total) by mouth daily., Disp: 90 tablet, Rfl: 3   Cholecalciferol (VITAMIN D -3 PO), Take 1 tablet by mouth in the morning., Disp: , Rfl:    clopidogrel  (PLAVIX ) 75 MG tablet, Take 1 tablet (75 mg total) by mouth daily with breakfast., Disp: 90 tablet, Rfl: 3   FLUoxetine  (PROZAC ) 20 MG capsule, Take 20 mg by mouth at bedtime., Disp: , Rfl:    fluticasone -salmeterol (WIXELA INHUB) 100-50 MCG/ACT AEPB, Inhale 1 puff then rinse mouth, once daily (Patient taking differently: Inhale 1 puff into the lungs 2 (two) times daily.), Disp: 180 each, Rfl: 4   MAGNESIUM  PO, Take 1 tablet by mouth in the morning., Disp: , Rfl:    metoprolol  succinate (TOPROL  XL) 25 MG 24 hr tablet, Take 1 tablet (25 mg total) by mouth daily., Disp: 90 tablet, Rfl: 3   nitroGLYCERIN  (NITROSTAT ) 0.4 MG SL  tablet, Place 1 tablet (0.4 mg total) under the tongue every 5 (five) minutes as needed. (Patient not taking: Reported on 06/27/2023), Disp: 25 tablet, Rfl: 3   OLANZapine -FLUoxetine  (SYMBYAX ) 3-25 MG capsule, Take 1 capsule by mouth at bedtime., Disp: , Rfl:    PSYLLIUM HUSK PO, Take 2 capsules by mouth in the morning and at bedtime., Disp: , Rfl:    rosuvastatin  (CRESTOR ) 40 MG tablet, Take 1 tablet (40 mg total) by mouth daily., Disp: 90 tablet, Rfl: 3   WEGOVY  2.4 MG/0.75ML SOAJ, Inject 2.4 mg into the skin once a week., Disp: 3 mL, Rfl: 5  Past Medical History: Past Medical History:  Diagnosis Date   Anxiety    psychiatrist: Dr. Gavin Kast    Arthritis    Asthma    Bronchitis    Colitis    Colon polyps    COVID-19 09/2020   Depression    psychiatrist: Dr. Gavin Kast   Elevated liver enzymes    Dr. Angelo Kennedy following   Essential hypertension, benign 03/05/2015   Fatty liver    GERD (gastroesophageal reflux disease)    Heart murmur    History of chicken pox    History of kidney stones    Hypertension    Multiple pulmonary nodules determined by computed tomography of lung 03/07/2014   Last CT suggested clearance of tree-in-bud.  Follows with Dr. Rupert Counts     Chest [facility] 2015-micronodules, groundglass, tree in bud  right upper lobe  CT chest 03/15/16 No evidence of interstitial lung disease or mycobacterium aviumcomplex. Scattered pulmonary nodules. No f/u needed- low risk   Osteoarthritis of right knee 07/29/2021   Postmenopausal bleeding 06/27/2020   Primary osteoarthritis of both knees 04/09/2011   Formatting of this note might be different from the original. Overview:  S/p left total knee replacement   Renal cell carcinoma    hx of left and right; Dr. Madelin Schatz   Trochanteric bursitis, left hip 09/27/2018    Tobacco Use: Social History   Tobacco Use  Smoking Status Never   Passive exposure: Never  Smokeless Tobacco Never    Labs: Review Flowsheet  More data exists       Latest Ref Rng & Units 12/15/2020 06/05/2021 05/07/2022 10/22/2022 05/31/2023  Labs for ITP Cardiac and Pulmonary Rehab  Cholestrol 0 - 200 mg/dL - 811  914  - 782   LDL (calc) 0 - 99 mg/dL - 956  213  - 086   Direct LDL mg/dL 578.4  - - - -  HDL-C >69 mg/dL - 62.95  28.41  - 45   Trlycerides <150 mg/dL - 324.4  010.2  - 51   Hemoglobin A1c 4.8 - 5.6 % - 5.7  6.3  5.2  5.2  5.2  5.2  5.1   TCO2 22 - 32 mmol/L - - - - 22     Details       Multiple values from one day are sorted in reverse-chronological order         Capillary Blood Glucose: No results found for: "GLUCAP"   Exercise Target Goals: Exercise Program Goal: Individual exercise prescription set using results from initial 6 min walk test and THRR while considering  patient's activity barriers and safety.   Exercise Prescription Goal: Initial exercise prescription builds to 30-45 minutes a day of aerobic activity, 2-3 days per week.  Home exercise guidelines will be given to patient during program as part of exercise prescription that the participant will acknowledge.  Activity Barriers & Risk Stratification:  Activity Barriers & Cardiac Risk Stratification - 06/23/23 1518       Activity Barriers & Cardiac Risk Stratification   Activity Barriers Left Knee Replacement;Right Knee Replacement;Joint Problems;Balance Concerns;History of Falls;Back Problems;Neck/Spine Problems;Deconditioning    Cardiac Risk Stratification High             6 Minute Walk:  6 Minute Walk     Row Name 06/23/23 1106         6 Minute Walk   Phase Initial     Distance 1200 feet     Walk Time 0 minutes     # of Rest Breaks 0     MPH 2.3     METS 2.73     RPE 11     Perceived Dyspnea  0     VO2 Peak 9.6     Symptoms No     Resting HR 62 bpm     Resting BP 124/80     Resting Oxygen Saturation  97 %     Exercise Oxygen Saturation  during 6 min walk 99 %     Max Ex. HR 81 bpm     Max Ex. BP 128/84     2 Minute Post BP 120/84               Oxygen Initial Assessment:   Oxygen Re-Evaluation:   Oxygen Discharge (Final Oxygen Re-Evaluation):   Initial Exercise  Prescription:  Initial Exercise Prescription - 06/23/23 1500       Date of Initial Exercise RX and Referring Provider   Date 06/23/23    Referring Provider Dr. Alyssa Backbone, MD    Expected Discharge Date 09/14/23      Recumbant Bike   Level 1    RPM 60    Watts 15    Minutes 15    METs 2      NuStep   Level 1    SPM 80    Minutes 15    METs 1.7      Prescription Details   Frequency (times per week) 3    Duration Progress to 30 minutes of continuous aerobic without signs/symptoms of physical distress      Intensity   THRR 40-80% of Max Heartrate 63-126    Ratings of Perceived Exertion 11-13    Perceived Dyspnea 0-4      Progression   Progression Continue progressive overload as per policy without signs/symptoms or physical distress.      Resistance Training   Training Prescription Yes    Weight 2 lbs wts    Reps 10-15             Perform Capillary Blood Glucose checks as needed.  Exercise Prescription Changes:   Exercise Prescription Changes     Row Name 06/29/23 1300 06/29/23 1400 07/22/23 1600         Response to Exercise   Blood Pressure (Admit) -- 122/78 124/70     Blood Pressure (Exercise) -- 134/70 120/76     Blood Pressure (Exit) -- 124/68 102/68     Heart Rate (Admit) -- 58 bpm 60 bpm     Heart Rate (Exercise) -- 102 bpm 100 bpm     Heart Rate (Exit) -- 68 bpm 69 bpm     Rating of Perceived Exertion (Exercise) -- 8 12     Symptoms -- None None     Comments -- Pt's first day in the CRP2 program Reviewed METs and goals     Duration -- Continue with 30 min of aerobic exercise without signs/symptoms of physical distress. Continue with 30 min of aerobic exercise without signs/symptoms of physical distress.     Intensity -- THRR unchanged THRR unchanged       Progression   Progression -- Continue to  progress workloads to maintain intensity without signs/symptoms of physical distress. Continue to progress workloads to maintain intensity without signs/symptoms of physical distress.     Average METs -- 2.1 3.37       Resistance Training   Training Prescription -- No Yes     Weight -- No weights on wednesdays 3 lbs     Reps -- -- 10-15     Time -- -- 10 Minutes       Interval Training   Interval Training -- No No       Recumbant Bike   Level -- 1 4     RPM -- 54 47     Watts -- 12 48     Minutes -- 15 15     METs -- 1.9 4.04       NuStep   Level -- 1 4     SPM -- 81 74     Minutes -- 15 15     METs -- 2.2 2.7              Exercise Comments:   Exercise Comments  Row Name 06/29/23 1413 07/22/23 1651         Exercise Comments Pt's first day in the CRP2 program. No complaints with todays sesssion. Pt voiced that bike was too easy, will increase to level 2 next session. Reviewed METs and goals. Pt is making progress on goals and METs. No changes in exercise Rx today.               Exercise Goals and Review:   Exercise Goals     Row Name 06/23/23 1513             Exercise Goals   Increase Physical Activity Yes       Intervention Provide advice, education, support and counseling about physical activity/exercise needs.;Develop an individualized exercise prescription for aerobic and resistive training based on initial evaluation findings, risk stratification, comorbidities and participant's personal goals.       Expected Outcomes Short Term: Attend rehab on a regular basis to increase amount of physical activity.;Long Term: Exercising regularly at least 3-5 days a week.;Long Term: Add in home exercise to make exercise part of routine and to increase amount of physical activity.       Increase Strength and Stamina Yes       Intervention Provide advice, education, support and counseling about physical activity/exercise needs.;Develop an individualized exercise  prescription for aerobic and resistive training based on initial evaluation findings, risk stratification, comorbidities and participant's personal goals.       Expected Outcomes Short Term: Increase workloads from initial exercise prescription for resistance, speed, and METs.;Short Term: Perform resistance training exercises routinely during rehab and add in resistance training at home;Long Term: Improve cardiorespiratory fitness, muscular endurance and strength as measured by increased METs and functional capacity ( )       Able to understand and use rate of perceived exertion (RPE) scale Yes       Intervention Provide education and explanation on how to use RPE scale       Expected Outcomes Short Term: Able to use RPE daily in rehab to express subjective intensity level;Long Term:  Able to use RPE to guide intensity level when exercising independently       Knowledge and understanding of Target Heart Rate Range (THRR) Yes       Intervention Provide education and explanation of THRR including how the numbers were predicted and where they are located for reference       Expected Outcomes Short Term: Able to state/look up THRR;Long Term: Able to use THRR to govern intensity when exercising independently;Short Term: Able to use daily as guideline for intensity in rehab       Understanding of Exercise Prescription Yes       Intervention Provide education, explanation, and written materials on patient's individual exercise prescription       Expected Outcomes Short Term: Able to explain program exercise prescription;Long Term: Able to explain home exercise prescription to exercise independently                Exercise Goals Re-Evaluation :  Exercise Goals Re-Evaluation     Row Name 06/29/23 1411 07/22/23 1649           Exercise Goal Re-Evaluation   Exercise Goals Review Increase Physical Activity;Increase Strength and Stamina;Able to understand and use rate of perceived exertion (RPE)  scale;Knowledge and understanding of Target Heart Rate Range (THRR);Understanding of Exercise Prescription Increase Physical Activity;Increase Strength and Stamina;Able to understand and use rate of perceived exertion (RPE) scale;Knowledge and  understanding of Target Heart Rate Range (THRR);Understanding of Exercise Prescription      Comments Pt's first day in the CRP2 program. Pt understands the Exercise Rx, RPE scale and THHR Reviewed METs and goals. Pt voices improvement in her knowlege of heart health though the education classes. Pt likes her exercise routine that she is doing her, which is also a goal. we will discuss home exercise soon.      Expected Outcomes Will continue to monitor patient and progress exercise workloads as tolerated. Will continue to monitor patient and progress exercise workloads as tolerated.               Discharge Exercise Prescription (Final Exercise Prescription Changes):  Exercise Prescription Changes - 07/22/23 1600       Response to Exercise   Blood Pressure (Admit) 124/70    Blood Pressure (Exercise) 120/76    Blood Pressure (Exit) 102/68    Heart Rate (Admit) 60 bpm    Heart Rate (Exercise) 100 bpm    Heart Rate (Exit) 69 bpm    Rating of Perceived Exertion (Exercise) 12    Symptoms None    Comments Reviewed METs and goals    Duration Continue with 30 min of aerobic exercise without signs/symptoms of physical distress.    Intensity THRR unchanged      Progression   Progression Continue to progress workloads to maintain intensity without signs/symptoms of physical distress.    Average METs 3.37      Resistance Training   Training Prescription Yes    Weight 3 lbs    Reps 10-15    Time 10 Minutes      Interval Training   Interval Training No      Recumbant Bike   Level 4    RPM 47    Watts 48    Minutes 15    METs 4.04      NuStep   Level 4    SPM 74    Minutes 15    METs 2.7             Nutrition:  Target Goals:  Understanding of nutrition guidelines, daily intake of sodium 1500mg , cholesterol 200mg , calories 30% from fat and 7% or less from saturated fats, daily to have 5 or more servings of fruits and vegetables.  Biometrics:  Pre Biometrics - 06/23/23 1030       Pre Biometrics   Waist Circumference 36.5 inches    Hip Circumference 44 inches    Waist to Hip Ratio 0.83 %    Triceps Skinfold 15 mm    % Body Fat 37.4 %    Grip Strength 23 kg    Flexibility 10 in    Single Leg Stand 11.5 seconds              Nutrition Therapy Plan and Nutrition Goals:  Nutrition Therapy & Goals - 06/30/23 1533       Nutrition Therapy   Diet Heart Healthy Diet    Drug/Food Interactions Statins/Certain Fruits      Personal Nutrition Goals   Nutrition Goal Patient to identify strategies for reducing cardiovascular risk by attending the [employer] education and nutrition series weekly.    Personal Goal #2 Patient to improve diet quality by using the plate method as a guide for meal planning to include lean protein/plant protein, fruits, vegetables, whole grains, nonfat dairy as part of a well-balanced diet.    Comments Ashtan has medical history of HTN, CAD, STEMI,  renal cell carcinmoa s/p partial L nephrectomy. She has been taking wegovy  for weight loss; per documentation, she is down ~45# over the last year (91.4kg on 05/21/2022). LDL remains above goal of <16. A1c is well controlled in a normal range, previously in a prediabetic range but has improved with weight loss/wegovy . Patient will benefit from participation in intensive cardiac rehab for nutrition, exercise, and lifestyle modification.      Intervention Plan   Intervention Prescribe, educate and counsel regarding individualized specific dietary modifications aiming towards targeted core components such as weight, hypertension, lipid management, diabetes, heart failure and other comorbidities.;Nutrition handout(s) given to patient.    Expected  Outcomes Short Term Goal: Understand basic principles of dietary content, such as calories, fat, sodium, cholesterol and nutrients.;Long Term Goal: Adherence to prescribed nutrition plan.             Nutrition Assessments:  Nutrition Assessments - 07/04/23 1408       Rate Your Plate Scores   Pre Score 56            MEDIFICTS Score Key: >=70 Need to make dietary changes  40-70 Heart Healthy Diet <= 40 Therapeutic Level Cholesterol Diet   Flowsheet Row INTENSIVE CARDIAC REHAB from 07/04/2023 in [facility]  Picture Your Plate Total Score on Admission 56      Picture Your Plate Scores: <10 Unhealthy dietary pattern with much room for improvement. 41-50 Dietary pattern unlikely to meet recommendations for good health and room for improvement. 51-60 More healthful dietary pattern, with some room for improvement.  >60 Healthy dietary pattern, although there may be some specific behaviors that could be improved.    Nutrition Goals Re-Evaluation:  Nutrition Goals Re-Evaluation     Row Name 06/30/23 1533             Goals   Current Weight 156 lb 1.4 oz (70.8 kg)       Comment LDL 106, A1c WNL       Expected Outcome Tyresa has medical history of HTN, CAD, STEMI, renal cell carcinmoa s/p partial L nephrectomy. She has been taking wegovy  for weight loss; per documentation, she is down ~45# over the last year (91.4kg on 05/21/2022). LDL remains above goal of <96. A1c is well controlled in a normal range, previously in a prediabetic range but has improved with weight loss/wegovy . Patient will benefit from participation in intensive cardiac rehab for nutrition, exercise, and lifestyle modification.                Nutrition Goals Re-Evaluation:  Nutrition Goals Re-Evaluation     Row Name 06/30/23 1533             Goals   Current Weight 156 lb 1.4 oz (70.8 kg)       Comment LDL 106, A1c WNL       Expected  Outcome Shasta has medical history of HTN, CAD, STEMI, renal cell carcinmoa s/p partial L nephrectomy. She has been taking wegovy  for weight loss; per documentation, she is down ~45# over the last year (91.4kg on 05/21/2022). LDL remains above goal of <04. A1c is well controlled in a normal range, previously in a prediabetic range but has improved with weight loss/wegovy . Patient will benefit from participation in intensive cardiac rehab for nutrition, exercise, and lifestyle modification.                Nutrition Goals Discharge (Final Nutrition Goals Re-Evaluation):  Nutrition Goals Re-Evaluation - 06/30/23 1533       Goals   Current Weight 156 lb 1.4 oz (70.8 kg)    Comment LDL 106, A1c WNL    Expected Outcome Emeri has medical history of HTN, CAD, STEMI, renal cell carcinmoa s/p partial L nephrectomy. She has been taking wegovy  for weight loss; per documentation, she is down ~45# over the last year (91.4kg on 05/21/2022). LDL remains above goal of <69. A1c is well controlled in a normal range, previously in a prediabetic range but has improved with weight loss/wegovy . Patient will benefit from participation in intensive cardiac rehab for nutrition, exercise, and lifestyle modification.             Psychosocial: Target Goals: Acknowledge presence or absence of significant depression and/or stress, maximize coping skills, provide positive support system. Participant is able to verbalize types and ability to use techniques and skills needed for reducing stress and depression.  Initial Review & Psychosocial Screening:  Initial Psych Review & Screening - 06/23/23 1604       Initial Review   Current issues with Current Depression;History of Depression;Current Anxiety/Panic;Current Stress Concerns    Source of Stress Concerns Chronic Illness      Family Dynamics   Good Support System? Yes      Barriers   Psychosocial barriers to participate in program The patient should benefit from  training in stress management and relaxation.;Psychosocial barriers identified (see note)      Screening Interventions   Interventions Encouraged to exercise;To provide support and resources with identified psychosocial needs;Provide feedback about the scores to participant;Program counselor consult    Expected Outcomes Short Term goal: Utilizing psychosocial counselor, staff and physician to assist with identification of specific Stressors or current issues interfering with healing process. Setting desired goal for each stressor or current issue identified.;Long Term Goal: Stressors or current issues are controlled or eliminated.;Short Term goal: Identification and review with participant of any Quality of Life or Depression concerns found by scoring the questionnaire.;Long Term goal: The participant improves quality of Life and PHQ9 Scores as seen by post scores and/or verbalization of changes   Nurse aware and will provide counseling resources for pt next week when she comes for exercise.            Quality of Life Scores:  Quality of Life - 06/23/23 1511       Quality of Life   Select Quality of Life      Quality of Life Scores   Health/Function Pre 24 %    Socioeconomic Pre 30 %    Psych/Spiritual Pre 24 %    Family Pre 30 %    GLOBAL Pre 26 %            Scores of 19 and below usually indicate a poorer quality of life in these areas.  A difference of  2-3 points is a clinically meaningful difference.  A difference of 2-3 points in the total score of the Quality of Life Index has been associated with significant improvement in overall quality of life, self-image, physical symptoms, and general health in studies assessing change in quality of life.  PHQ-9: Review Flowsheet  More data exists      06/27/2023 06/23/2023 11/17/2022 10/20/2022 07/22/2022  Depression screen PHQ 2/9  Decreased Interest 0 0 0 0 0  Down, Depressed, Hopeless 0 1 0 0 1  PHQ - 2 Score 0 1 0 0 1  Altered  sleeping 0 0 - - -  Tired, decreased energy 1 1 - - -  Change in appetite 0 0 - - -  Feeling bad or failure about yourself  0 0 - - -  Trouble concentrating 0 0 - - -  Moving slowly or fidgety/restless 0 0 - - -  Suicidal thoughts 0 0 - - -  PHQ-9 Score 1 2 - - -  Difficult doing work/chores Not difficult at all Not difficult at all - - -   Interpretation of Total Score  Total Score Depression Severity:  1-4 = Minimal depression, 5-9 = Mild depression, 10-14 = Moderate depression, 15-19 = Moderately severe depression, 20-27 = Severe depression   Psychosocial Evaluation and Intervention:   Psychosocial Re-Evaluation:  Psychosocial Re-Evaluation     Row Name 06/30/23 0958 07/26/23 9629           Psychosocial Re-Evaluation   Current issues with Current Depression;History of Depression;Current Stress Concerns;Current Anxiety/Panic;Current Psychotropic Meds Current Depression;History of Depression;Current Stress Concerns;Current Anxiety/Panic;Current Psychotropic Meds      Comments Pt has a history of depression and says that the current antidepressants she is taking is controlling her depression. Rilea says that she has felt  anxious since her Sharlon Deacon and is says she worried if she travels out of [employer] will she get the same care although [place] attend church in [place]. Reassured the patient that our staff is adequately trained and will provide good care to her while she participates in cardiac rehab. Joey says that she has received counseling in the past and would like to talk to a counselor. Will send referral to [facility]. Will notify patient's primary care provider Dr Marylee Snowball. Pt has a history of depression and says that the current antidepressants she is taking is controlling her depression. Tiffay has not voiced any increased concerns or stressors during exercise at cardiac rehab      Expected Outcomes Renee will have decreased or controlled depression/  anxiety  upon completion of cardiac Sadee will have decreased or controlled depression/ anxiety  upon completion of cardiac      Interventions Therapist referral;Stress management education;Encouraged to attend Cardiac Rehabilitation for the exercise;Physician referral;Relaxation education Therapist referral;Stress management education;Encouraged to attend Cardiac Rehabilitation for the exercise;Physician referral;Relaxation education      Continue Psychosocial Services  Follow up required by staff Follow up required by staff        Initial Review   Source of Stress Concerns Chronic Illness;Unable to perform yard/household activities;Poor Coping Skills Chronic Illness;Unable to perform yard/household activities;Poor Coping Skills      Comments Will continue to monitor and offer support as needed. Will continue to monitor and offer support as needed.               Psychosocial Discharge (Final Psychosocial Re-Evaluation):  Psychosocial Re-Evaluation - 07/26/23 0808       Psychosocial Re-Evaluation   Current issues with Current Depression;History of Depression;Current Stress Concerns;Current Anxiety/Panic;Current Psychotropic Meds    Comments Pt has a history of depression and says that the current antidepressants she is taking is controlling her depression. Nahiara has not voiced any increased concerns or stressors during exercise at cardiac rehab    Expected Outcomes Dianelys will have decreased or controlled depression/ anxiety  upon completion of cardiac    Interventions Therapist referral;Stress management education;Encouraged to attend Cardiac Rehabilitation for the exercise;Physician referral;Relaxation education    Continue Psychosocial Services  Follow up required by staff      Initial Review   Source  of Stress Concerns Chronic Illness;Unable to perform yard/household activities;Poor Coping Skills    Comments Will continue to monitor and offer support as needed.              Vocational Rehabilitation: Provide vocational rehab assistance to qualifying candidates.   Vocational Rehab Evaluation & Intervention:  Vocational Rehab - 06/23/23 1528       Initial Vocational Rehab Evaluation & Intervention   Assessment shows need for Vocational Rehabilitation No   Pt is retired            Education: Education Goals: Education classes will be provided on a weekly basis, covering required topics. Participant will state understanding/return demonstration of topics presented.    Education     Row Name 06/29/23 1300     Education   Cardiac Education Topics Pritikin   Orthoptist   Educator Dietitian   Weekly Topic Comforting Weekend Breakfasts   Instruction Review Code 1- Verbalizes Understanding   Class Start Time 1145   Class Stop Time 1228   Class Time Calculation (min) 43 min    Row Name 07/01/23 1300     Education   Cardiac Education Topics Pritikin   Select Core Videos     Core Videos   Educator Dietitian   Select Nutrition   Nutrition Dining Out - Part 1   Instruction Review Code 1- Verbalizes Understanding   Class Start Time 1145   Class Stop Time 1222   Class Time Calculation (min) 37 min    Row Name 07/04/23 1100     Education   Cardiac Education Topics Pritikin   Psychologist, forensic Exercise Education   Exercise Education Biomechanial Limitations   Instruction Review Code 1- Verbalizes Understanding   Class Start Time 1145   Class Stop Time 1218   Class Time Calculation (min) 33 min    Row Name 07/06/23 1200     Education   Cardiac Education Topics Pritikin   Customer service manager   Weekly Topic Fast Evening Meals   Instruction Review Code 1- Verbalizes Understanding   Class Start Time 1145   Class Stop Time 1225   Class Time Calculation (min) 40 min    Row Name 07/11/23 1500      Education   Cardiac Education Topics Pritikin   Glass blower/designer Nutrition   Nutrition Workshop Fueling a Forensic psychologist   Instruction Review Code 1- Tax inspector   Class Start Time 1145   Class Stop Time 1230   Class Time Calculation (min) 45 min    Row Name 07/13/23 1500     Education   Cardiac Education Topics Pritikin   Customer service manager   Weekly Topic International Cuisine- Spotlight on the [employer] Zones   Instruction Review Code 1- Verbalizes Understanding   Class Start Time 1145   Class Stop Time 1225   Class Time Calculation (min) 40 min    Row Name 07/15/23 1100     Education   Cardiac Education Topics Pritikin   Psychologist, forensic Exercise Education   Exercise Education Improving Performance   Instruction Review Code  1- Verbalizes Understanding   Class Start Time 1145   Class Stop Time 1225   Class Time Calculation (min) 40 min    Row Name 07/18/23 1600     Education   Cardiac Education Topics Pritikin   Geographical information systems officer Psychosocial   Psychosocial Workshop Healthy Sleep for a Healthy Heart   Instruction Review Code 1- Verbalizes Understanding   Class Start Time 1148   Class Stop Time 1234   Class Time Calculation (min) 46 min    Row Name 07/22/23 1200     Education   Cardiac Education Topics Pritikin   Nurse, children's Exercise Physiologist   Select Psychosocial   Psychosocial How Our Thoughts Can Heal Our Hearts   Instruction Review Code 1- Verbalizes Understanding   Class Start Time 1146   Class Stop Time 1220   Class Time Calculation (min) 34 min    Row Name 07/25/23 1300     Education   Cardiac Education Topics Pritikin   Select Workshops     Workshops   Educator Exercise Physiologist    Select Exercise   Exercise Workshop Managing Heart Disease: Your Path to a Healthier Heart   Instruction Review Code 1- Verbalizes Understanding   Class Start Time 1145   Class Stop Time 1237   Class Time Calculation (min) 52 min            Core Videos: Exercise    Move It!  Clinical staff conducted group or individual video education with verbal and written material and guidebook.  Patient learns the recommended Pritikin exercise program. Exercise with the goal of living a long, healthy life. Some of the health benefits of exercise include controlled diabetes, healthier blood pressure levels, improved cholesterol levels, improved heart and lung capacity, improved sleep, and better body composition. Everyone should speak with their doctor before starting or changing an exercise routine.  Biomechanical Limitations Clinical staff conducted group or individual video education with verbal and written material and guidebook.  Patient learns how biomechanical limitations can impact exercise and how we can mitigate and possibly overcome limitations to have an impactful and balanced exercise routine.  Body Composition Clinical staff conducted group or individual video education with verbal and written material and guidebook.  Patient learns that body composition (ratio of muscle mass to fat mass) is a key component to assessing overall fitness, rather than body weight alone. Increased fat mass, especially visceral belly fat, can put us  at increased risk for metabolic syndrome, type 2 diabetes, heart disease, and even death. It is recommended to combine diet and exercise (cardiovascular and resistance training) to improve your body composition. Seek guidance from your physician and exercise physiologist before implementing an exercise routine.  Exercise Action Plan Clinical staff conducted group or individual video education with verbal and written material and guidebook.  Patient learns the  recommended strategies to achieve and enjoy long-term exercise adherence, including variety, self-motivation, self-efficacy, and positive decision making. Benefits of exercise include fitness, good health, weight management, more energy, better sleep, less stress, and overall well-being.  Medical   Heart Disease Risk Reduction Clinical staff conducted group or individual video education with verbal and written material and guidebook.  Patient learns our heart is our most vital organ as it circulates oxygen, nutrients, white blood cells, and hormones throughout the entire body, and carries waste away. Data supports a  plant-based eating plan like the Pritikin Program for its effectiveness in slowing progression of and reversing heart disease. The video provides a number of recommendations to address heart disease.   Metabolic Syndrome and Belly Fat  Clinical staff conducted group or individual video education with verbal and written material and guidebook.  Patient learns what metabolic syndrome is, how it leads to heart disease, and how one can reverse it and keep it from coming back. You have metabolic syndrome if you have 3 of the following 5 criteria: abdominal obesity, high blood pressure, high triglycerides, low HDL cholesterol, and high blood sugar.  Hypertension and Heart Disease Clinical staff conducted group or individual video education with verbal and written material and guidebook.  Patient learns that high blood pressure, or hypertension, is very common in the [place] . Hypertension is largely due to excessive salt intake, but other important risk factors include being overweight, physical inactivity, drinking too much alcohol, smoking, and not eating enough potassium from fruits and vegetables. High blood pressure is a leading risk factor for heart attack, stroke, congestive heart failure, dementia, kidney failure, and premature death. Long-term effects of excessive salt intake include  stiffening of the arteries and thickening of heart muscle and organ damage. Recommendations include ways to reduce hypertension and the risk of heart disease.  Diseases of Our Time - Focusing on Diabetes Clinical staff conducted group or individual video education with verbal and written material and guidebook.  Patient learns why the best way to stop diseases of our time is prevention, through food and other lifestyle changes. Medicine (such as prescription pills and surgeries) is often only a Band-Aid on the problem, not a long-term solution. Most common diseases of our time include obesity, type 2 diabetes, hypertension, heart disease, and cancer. The Pritikin Program is recommended and has been proven to help reduce, reverse, and/or prevent the damaging effects of metabolic syndrome.  Nutrition   Overview of the Pritikin Eating Plan  Clinical staff conducted group or individual video education with verbal and written material and guidebook.  Patient learns about the Pritikin Eating Plan for disease risk reduction. The Pritikin Eating Plan emphasizes a wide variety of unrefined, minimally-processed carbohydrates, like fruits, vegetables, whole grains, and legumes. Go, Caution, and Stop food choices are explained. Plant-based and lean animal proteins are emphasized. Rationale provided for low sodium intake for blood pressure control, low added sugars for blood sugar stabilization, and low added fats and oils for coronary artery disease risk reduction and weight management.  Calorie Density  Clinical staff conducted group or individual video education with verbal and written material and guidebook.  Patient learns about calorie density and how it impacts the Pritikin Eating Plan. Knowing the characteristics of the food you choose will help you decide whether those foods will lead to weight gain or weight loss, and whether you want to consume more or less of them. Weight loss is usually a side effect of  the Pritikin Eating Plan because of its focus on low calorie-dense foods.  Label Reading  Clinical staff conducted group or individual video education with verbal and written material and guidebook.  Patient learns about the Pritikin recommended label reading guidelines and corresponding recommendations regarding calorie density, added sugars, sodium content, and whole grains.  Dining Out - Part 1  Clinical staff conducted group or individual video education with verbal and written material and guidebook.  Patient learns that restaurant meals can be sabotaging because they can be so high in calories, fat,  sodium, and/or sugar. Patient learns recommended strategies on how to positively address this and avoid unhealthy pitfalls.  Facts on Fats  Clinical staff conducted group or individual video education with verbal and written material and guidebook.  Patient learns that lifestyle modifications can be just as effective, if not more so, as many medications for lowering your risk of heart disease. A Pritikin lifestyle can help to reduce your risk of inflammation and atherosclerosis (cholesterol build-up, or plaque, in the artery walls). Lifestyle interventions such as dietary choices and physical activity address the cause of atherosclerosis. A review of the types of fats and their impact on blood cholesterol levels, along with dietary recommendations to reduce fat intake is also included.  Nutrition Action Plan  Clinical staff conducted group or individual video education with verbal and written material and guidebook.  Patient learns how to incorporate Pritikin recommendations into their lifestyle. Recommendations include planning and keeping personal health goals in mind as an important part of their success.  Healthy Mind-Set    Healthy Minds, Bodies, Hearts  Clinical staff conducted group or individual video education with verbal and written material and guidebook.  Patient learns how to  identify when they are stressed. Video will discuss the impact of that stress, as well as the many benefits of stress management. Patient will also be introduced to stress management techniques. The way we think, act, and feel has an impact on our hearts.  How Our Thoughts Can Heal Our Hearts  Clinical staff conducted group or individual video education with verbal and written material and guidebook.  Patient learns that negative thoughts can cause depression and anxiety. This can result in negative lifestyle behavior and serious health problems. Cognitive behavioral therapy is an effective method to help control our thoughts in order to change and improve our emotional outlook.  Additional Videos:  Exercise    Improving Performance  Clinical staff conducted group or individual video education with verbal and written material and guidebook.  Patient learns to use a non-linear approach by alternating intensity levels and lengths of time spent exercising to help burn more calories and lose more body fat. Cardiovascular exercise helps improve heart health, metabolism, hormonal balance, blood sugar control, and recovery from fatigue. Resistance training improves strength, endurance, balance, coordination, reaction time, metabolism, and muscle mass. Flexibility exercise improves circulation, posture, and balance. Seek guidance from your physician and exercise physiologist before implementing an exercise routine and learn your capabilities and proper form for all exercise.  Introduction to Yoga  Clinical staff conducted group or individual video education with verbal and written material and guidebook.  Patient learns about yoga, a discipline of the coming together of mind, breath, and body. The benefits of yoga include improved flexibility, improved range of motion, better posture and core strength, increased lung function, weight loss, and positive self-image. Yoga's heart health benefits include lowered  blood pressure, healthier heart rate, decreased cholesterol and triglyceride levels, improved immune function, and reduced stress. Seek guidance from your physician and exercise physiologist before implementing an exercise routine and learn your capabilities and proper form for all exercise.  Medical   Aging: Enhancing Your Quality of Life  Clinical staff conducted group or individual video education with verbal and written material and guidebook.  Patient learns key strategies and recommendations to stay in good physical health and enhance quality of life, such as prevention strategies, having an advocate, securing a Health Care Proxy and Power of Attorney, and keeping a list of medications and system  for tracking them. It also discusses how to avoid risk for bone loss.  Biology of Weight Control  Clinical staff conducted group or individual video education with verbal and written material and guidebook.  Patient learns that weight gain occurs because we consume more calories than we burn (eating more, moving less). Even if your body weight is normal, you may have higher ratios of fat compared to muscle mass. Too much body fat puts you at increased risk for cardiovascular disease, heart attack, stroke, type 2 diabetes, and obesity-related cancers. In addition to exercise, following the Pritikin Eating Plan can help reduce your risk.  Decoding Lab Results  Clinical staff conducted group or individual video education with verbal and written material and guidebook.  Patient learns that lab test reflects one measurement whose values change over time and are influenced by many factors, including medication, stress, sleep, exercise, food, hydration, pre-existing medical conditions, and more. It is recommended to use the knowledge from this video to become more involved with your lab results and evaluate your numbers to speak with your doctor.   Diseases of Our Time - Overview  Clinical staff conducted  group or individual video education with verbal and written material and guidebook.  Patient learns that according to the CDC, 50% to 70% of chronic diseases (such as obesity, type 2 diabetes, elevated lipids, hypertension, and heart disease) are avoidable through lifestyle improvements including healthier food choices, listening to satiety cues, and increased physical activity.  Sleep Disorders Clinical staff conducted group or individual video education with verbal and written material and guidebook.  Patient learns how good quality and duration of sleep are important to overall health and well-being. Patient also learns about sleep disorders and how they impact health along with recommendations to address them, including discussing with a physician.  Nutrition  Dining Out - Part 2 Clinical staff conducted group or individual video education with verbal and written material and guidebook.  Patient learns how to plan ahead and communicate in order to maximize their dining experience in a healthy and nutritious manner. Included are recommended food choices based on the type of restaurant the patient is visiting.   Fueling a Banker conducted group or individual video education with verbal and written material and guidebook.  There is a strong connection between our food choices and our health. Diseases like obesity and type 2 diabetes are very prevalent and are in large-part due to lifestyle choices. The Pritikin Eating Plan provides plenty of food and hunger-curbing satisfaction. It is easy to follow, affordable, and helps reduce health risks.  Menu Workshop  Clinical staff conducted group or individual video education with verbal and written material and guidebook.  Patient learns that restaurant meals can sabotage health goals because they are often packed with calories, fat, sodium, and sugar. Recommendations include strategies to plan ahead and to communicate with the  manager, chef, or server to help order a healthier meal.  Planning Your Eating Strategy  Clinical staff conducted group or individual video education with verbal and written material and guidebook.  Patient learns about the Pritikin Eating Plan and its benefit of reducing the risk of disease. The Pritikin Eating Plan does not focus on calories. Instead, it emphasizes high-quality, nutrient-rich foods. By knowing the characteristics of the foods, we choose, we can determine their calorie density and make informed decisions.  Targeting Your Nutrition Priorities  Clinical staff conducted group or individual video education with verbal and written material and guidebook.  Patient learns that lifestyle habits have a tremendous impact on disease risk and progression. This video provides eating and physical activity recommendations based on your personal health goals, such as reducing LDL cholesterol, losing weight, preventing or controlling type 2 diabetes, and reducing high blood pressure.  Vitamins and Minerals  Clinical staff conducted group or individual video education with verbal and written material and guidebook.  Patient learns different ways to obtain key vitamins and minerals, including through a recommended healthy diet. It is important to discuss all supplements you take with your doctor.   Healthy Mind-Set    Smoking Cessation  Clinical staff conducted group or individual video education with verbal and written material and guidebook.  Patient learns that cigarette smoking and tobacco addiction pose a serious health risk which affects millions of people. Stopping smoking will significantly reduce the risk of heart disease, lung disease, and many forms of cancer. Recommended strategies for quitting are covered, including working with your doctor to develop a successful plan.  Culinary   Becoming a Set designer conducted group or individual video education with verbal and  written material and guidebook.  Patient learns that cooking at home can be healthy, cost-effective, quick, and puts them in control. Keys to cooking healthy recipes will include looking at your recipe, assessing your equipment needs, planning ahead, making it simple, choosing cost-effective seasonal ingredients, and limiting the use of added fats, salts, and sugars.  Cooking - Breakfast and Snacks  Clinical staff conducted group or individual video education with verbal and written material and guidebook.  Patient learns how important breakfast is to satiety and nutrition through the entire day. Recommendations include key foods to eat during breakfast to help stabilize blood sugar levels and to prevent overeating at meals later in the day. Planning ahead is also a key component.  Cooking - Educational psychologist conducted group or individual video education with verbal and written material and guidebook.  Patient learns eating strategies to improve overall health, including an approach to cook more at home. Recommendations include thinking of animal protein as a side on your plate rather than center stage and focusing instead on lower calorie dense options like vegetables, fruits, whole grains, and plant-based proteins, such as beans. Making sauces in large quantities to freeze for later and leaving the skin on your vegetables are also recommended to maximize your experience.  Cooking - Healthy Salads and Dressing Clinical staff conducted group or individual video education with verbal and written material and guidebook.  Patient learns that vegetables, fruits, whole grains, and legumes are the foundations of the Pritikin Eating Plan. Recommendations include how to incorporate each of these in flavorful and healthy salads, and how to create homemade salad dressings. Proper handling of ingredients is also covered. Cooking - Soups and [employer] - Soups and Desserts Clinical staff  conducted group or individual video education with verbal and written material and guidebook.  Patient learns that Pritikin soups and desserts make for easy, nutritious, and delicious snacks and meal components that are low in sodium, fat, sugar, and calorie density, while high in vitamins, minerals, and filling fiber. Recommendations include simple and healthy ideas for soups and desserts.   Overview     The Pritikin Solution Program Overview Clinical staff conducted group or individual video education with verbal and written material and guidebook.  Patient learns that the results of the Pritikin Program have been documented in more than 100 articles published in  peer-reviewed journals, and the benefits include reducing risk factors for (and, in some cases, even reversing) high cholesterol, high blood pressure, type 2 diabetes, obesity, and more! An overview of the three key pillars of the Pritikin Program will be covered: eating well, doing regular exercise, and having a healthy mind-set.  WORKSHOPS  Exercise: Exercise Basics: Building Your Action Plan Clinical staff led group instruction and group discussion with PowerPoint presentation and patient guidebook. To enhance the learning environment the use of posters, models and videos may be added. At the conclusion of this workshop, patients will comprehend the difference between physical activity and exercise, as well as the benefits of incorporating both, into their routine. Patients will understand the FITT (Frequency, Intensity, Time, and Type) principle and how to use it to build an exercise action plan. In addition, safety concerns and other considerations for exercise and cardiac rehab will be addressed by the presenter. The purpose of this lesson is to promote a comprehensive and effective weekly exercise routine in order to improve patients' overall level of fitness.   Managing Heart Disease: Your Path to a Healthier Heart Clinical  staff led group instruction and group discussion with PowerPoint presentation and patient guidebook. To enhance the learning environment the use of posters, models and videos may be added.At the conclusion of this workshop, patients will understand the anatomy and physiology of the heart. Additionally, they will understand how Pritikin's three pillars impact the risk factors, the progression, and the management of heart disease.  The purpose of this lesson is to provide a high-level overview of the heart, heart disease, and how the Pritikin lifestyle positively impacts risk factors.  Exercise Biomechanics Clinical staff led group instruction and group discussion with PowerPoint presentation and patient guidebook. To enhance the learning environment the use of posters, models and videos may be added. Patients will learn how the structural parts of their bodies function and how these functions impact their daily activities, movement, and exercise. Patients will learn how to promote a neutral spine, learn how to manage pain, and identify ways to improve their physical movement in order to promote healthy living. The purpose of this lesson is to expose patients to common physical limitations that impact physical activity. Participants will learn practical ways to adapt and manage aches and pains, and to minimize their effect on regular exercise. Patients will learn how to maintain good posture while sitting, walking, and lifting.  Balance Training and Fall Prevention  Clinical staff led group instruction and group discussion with PowerPoint presentation and patient guidebook. To enhance the learning environment the use of posters, models and videos may be added. At the conclusion of this workshop, patients will understand the importance of their sensorimotor skills (vision, proprioception, and the vestibular system) in maintaining their ability to balance as they age. Patients will apply a variety of  balancing exercises that are appropriate for their current level of function. Patients will understand the common causes for poor balance, possible solutions to these problems, and ways to modify their physical environment in order to minimize their fall risk. The purpose of this lesson is to teach patients about the importance of maintaining balance as they age and ways to minimize their risk of falling.  WORKSHOPS   Nutrition:  Fueling a Ship broker led group instruction and group discussion with PowerPoint presentation and patient guidebook. To enhance the learning environment the use of posters, models and videos may be added. Patients will review the foundational principles of the  Pritikin Eating Plan and understand what constitutes a serving size in each of the food groups. Patients will also learn Pritikin-friendly foods that are better choices when away from home and review make-ahead meal and snack options. Calorie density will be reviewed and applied to three nutrition priorities: weight maintenance, weight loss, and weight gain. The purpose of this lesson is to reinforce (in a group setting) the key concepts around what patients are recommended to eat and how to apply these guidelines when away from home by planning and selecting Pritikin-friendly options. Patients will understand how calorie density may be adjusted for different weight management goals.  Mindful Eating  Clinical staff led group instruction and group discussion with PowerPoint presentation and patient guidebook. To enhance the learning environment the use of posters, models and videos may be added. Patients will briefly review the concepts of the Pritikin Eating Plan and the importance of low-calorie dense foods. The concept of mindful eating will be introduced as well as the importance of paying attention to internal hunger signals. Triggers for non-hunger eating and techniques for dealing with triggers will be  explored. The purpose of this lesson is to provide patients with the opportunity to review the basic principles of the Pritikin Eating Plan, discuss the value of eating mindfully and how to measure internal cues of hunger and fullness using the Hunger Scale. Patients will also discuss reasons for non-hunger eating and learn strategies to use for controlling emotional eating.  Targeting Your Nutrition Priorities Clinical staff led group instruction and group discussion with PowerPoint presentation and patient guidebook. To enhance the learning environment the use of posters, models and videos may be added. Patients will learn how to determine their genetic susceptibility to disease by reviewing their family history. Patients will gain insight into the importance of diet as part of an overall healthy lifestyle in mitigating the impact of genetics and other environmental insults. The purpose of this lesson is to provide patients with the opportunity to assess their personal nutrition priorities by looking at their family history, their own health history and current risk factors. Patients will also be able to discuss ways of prioritizing and modifying the Pritikin Eating Plan for their highest risk areas  Menu  Clinical staff led group instruction and group discussion with PowerPoint presentation and patient guidebook. To enhance the learning environment the use of posters, models and videos may be added. Using menus brought in from [employer], or printed from [employer], patients will apply the Pritikin dining out guidelines that were presented in the [employer] video. Patients will also be able to practice these guidelines in a variety of provided scenarios. The purpose of this lesson is to provide patients with the opportunity to practice hands-on learning of the Pritikin Dining Out guidelines with actual menus and practice scenarios.  Label Reading Clinical staff led group  instruction and group discussion with PowerPoint presentation and patient guidebook. To enhance the learning environment the use of posters, models and videos may be added. Patients will review and discuss the Pritikin label reading guidelines presented in Pritikin's Label Reading Educational series video. Using fool labels brought in from local grocery stores and markets, patients will apply the label reading guidelines and determine if the packaged food meet the Pritikin guidelines. The purpose of this lesson is to provide patients with the opportunity to review, discuss, and practice hands-on learning of the Pritikin Label Reading guidelines with actual packaged food labels. Cooking School  [employer]  Workshops are designed to teach patients ways to prepare quick, simple, and affordable recipes at home. The importance of nutrition's role in chronic disease risk reduction is reflected in its emphasis in the overall Pritikin program. By learning how to prepare essential core Pritikin Eating Plan recipes, patients will increase control over what they eat; be able to customize the flavor of foods without the use of added salt, sugar, or fat; and improve the quality of the food they consume. By learning a set of core recipes which are easily assembled, quickly prepared, and affordable, patients are more likely to prepare more healthy foods at home. These workshops focus on convenient breakfasts, simple entres, side dishes, and desserts which can be prepared with minimal effort and are consistent with nutrition recommendations for cardiovascular risk reduction. Cooking [employer] are taught by a Armed forces logistics/support/administrative officer (RD) who has been trained by the [employer]. The chef or RD has a clear understanding of the importance of minimizing - if not completely eliminating - added fat, sugar, and sodium in recipes. Throughout the series of Cooking School Workshop sessions, patients  will learn about healthy ingredients and efficient methods of cooking to build confidence in their capability to prepare    Cooking School weekly topics:  Adding Flavor- Sodium-Free  Fast and Healthy Breakfasts  Powerhouse Plant-Based Proteins  Satisfying Salads and Dressings  Simple Sides and Sauces  International Cuisine-Spotlight on the [employer] Zones  Delicious Desserts  Savory Soups  [employer] - Meals in a Astronomer Appetizers and Snacks  Comforting Weekend Breakfasts  One-Pot Wonders   Fast Evening Meals  Landscape architect Your Pritikin Plate  WORKSHOPS   Healthy Mindset (Psychosocial):  Focused Goals, Sustainable Changes Clinical staff led group instruction and group discussion with PowerPoint presentation and patient guidebook. To enhance the learning environment the use of posters, models and videos may be added. Patients will be able to apply effective goal setting strategies to establish at least one personal goal, and then take consistent, meaningful action toward that goal. They will learn to identify common barriers to achieving personal goals and develop strategies to overcome them. Patients will also gain an understanding of how our mind-set can impact our ability to achieve goals and the importance of cultivating a positive and growth-oriented mind-set. The purpose of this lesson is to provide patients with a deeper understanding of how to set and achieve personal goals, as well as the tools and strategies needed to overcome common obstacles which may arise along the way.  From Head to Heart: The Power of a Healthy Outlook  Clinical staff led group instruction and group discussion with PowerPoint presentation and patient guidebook. To enhance the learning environment the use of posters, models and videos may be added. Patients will be able to recognize and describe the impact of emotions and mood on physical health. They will discover the importance  of self-care and explore self-care practices which may work for them. Patients will also learn how to utilize the 4 C's to cultivate a healthier outlook and better manage stress and challenges. The purpose of this lesson is to demonstrate to patients how a healthy outlook is an essential part of maintaining good health, especially as they continue their cardiac rehab journey.  Healthy Sleep for a Healthy Heart Clinical staff led group instruction and group discussion with PowerPoint presentation and patient guidebook. To enhance the learning environment the use of posters, models and videos may be added.  At the conclusion of this workshop, patients will be able to demonstrate knowledge of the importance of sleep to overall health, well-being, and quality of life. They will understand the symptoms of, and treatments for, common sleep disorders. Patients will also be able to identify daytime and nighttime behaviors which impact sleep, and they will be able to apply these tools to help manage sleep-related challenges. The purpose of this lesson is to provide patients with a general overview of sleep and outline the importance of quality sleep. Patients will learn about a few of the most common sleep disorders. Patients will also be introduced to the concept of "sleep hygiene," and discover ways to self-manage certain sleeping problems through simple daily behavior changes. Finally, the workshop will motivate patients by clarifying the links between quality sleep and their goals of heart-healthy living.   Recognizing and Reducing Stress Clinical staff led group instruction and group discussion with PowerPoint presentation and patient guidebook. To enhance the learning environment the use of posters, models and videos may be added. At the conclusion of this workshop, patients will be able to understand the types of stress reactions, differentiate between acute and chronic stress, and recognize the impact that  chronic stress has on their health. They will also be able to apply different coping mechanisms, such as reframing negative self-talk. Patients will have the opportunity to practice a variety of stress management techniques, such as deep abdominal breathing, progressive muscle relaxation, and/or guided imagery.  The purpose of this lesson is to educate patients on the role of stress in their lives and to provide healthy techniques for coping with it.  Learning Barriers/Preferences:  Learning Barriers/Preferences - 06/23/23 1523       Learning Barriers/Preferences   Learning Barriers None    Learning Preferences Computer/Internet;Written Material             Education Topics:  Knowledge Questionnaire Score:  Knowledge Questionnaire Score - 06/23/23 1512       Knowledge Questionnaire Score   Pre Score 22/24             Core Components/Risk Factors/Patient Goals at Admission:  Personal Goals and Risk Factors at Admission - 06/23/23 1607       Core Components/Risk Factors/Patient Goals on Admission   Hypertension Yes    Intervention Provide education on lifestyle modifcations including regular physical activity/exercise, weight management, moderate sodium restriction and increased consumption of fresh fruit, vegetables, and low fat dairy, alcohol moderation, and smoking cessation.;Monitor prescription use compliance.    Expected Outcomes Short Term: Continued assessment and intervention until BP is < 140/41mm HG in hypertensive participants. < 130/62mm HG in hypertensive participants with diabetes, heart failure or chronic kidney disease.;Long Term: Maintenance of blood pressure at goal levels.    Lipids Yes    Intervention Provide education and support for participant on nutrition & aerobic/resistive exercise along with prescribed medications to achieve LDL 70mg , HDL >40mg .    Expected Outcomes Short Term: Participant states understanding of desired cholesterol values and is  compliant with medications prescribed. Participant is following exercise prescription and nutrition guidelines.;Long Term: Cholesterol controlled with medications as prescribed, with individualized exercise RX and with personalized nutrition plan. Value goals: LDL < 70mg , HDL > 40 mg.    Stress Yes    Intervention Offer individual and/or small group education and counseling on adjustment to heart disease, stress management and health-related lifestyle change. Teach and support self-help strategies.;Refer participants experiencing significant psychosocial distress to appropriate mental health specialists for further  evaluation and treatment. When possible, include family members and significant others in education/counseling sessions.    Expected Outcomes Short Term: Participant demonstrates changes in health-related behavior, relaxation and other stress management skills, ability to obtain effective social support, and compliance with psychotropic medications if prescribed.;Long Term: Emotional wellbeing is indicated by absence of clinically significant psychosocial distress or social isolation.    Personal Goal Other Yes    Personal Goal Short term: knowledge on heart health, Ex routine that she can stick with Long term: Balance, flexibility and stamina    Intervention Will continue to monitor pt and progress workloads as tolerated without sign or symptom    Expected Outcomes Pt will achieve her goals and gain strength             Core Components/Risk Factors/Patient Goals Review:   Goals and Risk Factor Review     Row Name 06/30/23 1039 07/05/23 0931 07/26/23 0810         Core Components/Risk Factors/Patient Goals Review   Personal Goals Review Weight Management/Obesity;Lipids;Stress;Hypertension Weight Management/Obesity;Lipids;Stress;Hypertension Weight Management/Obesity;Lipids;Stress;Hypertension     Review Brynnlie started cardiac rehab on 06/29/23. Kealy did well with exercise. Vital  signs were stable. Keirstin started cardiac rehab on 06/29/23. Suttyn is off to a good start  with exercise. Vital signs have been stable. Tamela is doing well  with exercise at cardiac rehab. Vital signs have been stable. Krissia has been increasing her met level     Expected Outcomes Annison will continue to participate in cardiac rehab for exercise, nutrtion and lifestyle modifications Saya will continue to participate in cardiac rehab for exercise, nutrtion and lifestyle modifications Kilie will continue to participate in cardiac rehab for exercise, nutrtion and lifestyle modifications              Core Components/Risk Factors/Patient Goals at Discharge (Final Review):   Goals and Risk Factor Review - 07/26/23 0810       Core Components/Risk Factors/Patient Goals Review   Personal Goals Review Weight Management/Obesity;Lipids;Stress;Hypertension    Review Mariama is doing well  with exercise at cardiac rehab. Vital signs have been stable. Ellarie has been increasing her met level    Expected Outcomes Breeley will continue to participate in cardiac rehab for exercise, nutrtion and lifestyle modifications             ITP Comments:  ITP Comments     Row Name 06/23/23 1017 06/30/23 0956 07/05/23 0928 07/26/23 0806     ITP Comments Gaylyn Keas, MD: Medical Director.  Introduction to the [employer] /  Intensive Cardiac Rehab.  Initial orientation packet reviewed with the patient. 30 Day ITP Review. Tanyiah started cardiac rehab on 06/29/23. Jayra did well with exercise. 30 Day ITP Review. Amada started cardiac rehab on 06/29/23. Cerina is off to a good start with exercise. 30 Day ITP Review.  Skya has good attendance and participation  with exercise at cardiac rehab             Comments: See ITP comments.Monte Antonio RN BSN

## 2023-07-27 ENCOUNTER — Encounter (HOSPITAL_COMMUNITY)
Admission: RE | Admit: 2023-07-27 | Discharge: 2023-07-27 | Disposition: A | Discharge status: Home / Self Care | Source: Ambulatory Visit | Attending: Internal Medicine

## 2023-07-27 DIAGNOSIS — I213 ST elevation (STEMI) myocardial infarction of unspecified site: Secondary | ICD-10-CM | POA: Diagnosis not present

## 2023-07-27 NOTE — Progress Notes (Signed)
Reviewed home exercise Rx with patient today.  Encouraged warm-up, cool-down, and stretching. Reviewed THRR of  63 - 126 and keeping RPE between 11-13. Encouraged to hydrate with activity.  Reviewed weather parameters for temperature and humidity for safe exercise outdoors. Reviewed S/S to terminate exercise and when to call 911 vs MD. Reviewed the use of NTG and pt was encouraged to carry at all times. Pt encouraged to always carry a cell phone for safety when exercising outdoors. Pt verbalized understanding of the home exercise Rx and was provided a copy.   Lorin Picket MS, ACSM-CEP, CCRP

## 2023-07-28 DIAGNOSIS — E785 Hyperlipidemia, unspecified: Secondary | ICD-10-CM | POA: Diagnosis not present

## 2023-07-28 LAB — LIPID PANEL
Chol/HDL Ratio: 2.4 ratio (ref 0.0–4.4)
Cholesterol, Total: 110 mg/dL (ref 100–199)
HDL: 46 mg/dL (ref >39)
LDL Chol Calc (NIH): 49 mg/dL (ref 0–99)
Triglycerides: 70 mg/dL (ref 0–149)
VLDL Cholesterol Cal: 15 mg/dL (ref 5–40)

## 2023-07-29 ENCOUNTER — Encounter (HOSPITAL_COMMUNITY)
Admission: RE | Admit: 2023-07-29 | Discharge: 2023-07-29 | Disposition: A | Discharge status: Home / Self Care | Source: Ambulatory Visit | Attending: Internal Medicine | Admitting: Internal Medicine

## 2023-07-29 DIAGNOSIS — I213 ST elevation (STEMI) myocardial infarction of unspecified site: Secondary | ICD-10-CM

## 2023-08-01 ENCOUNTER — Encounter (HOSPITAL_COMMUNITY)
Admission: RE | Admit: 2023-08-01 | Discharge: 2023-08-01 | Disposition: A | Discharge status: Home / Self Care | Source: Ambulatory Visit | Attending: Internal Medicine | Admitting: Internal Medicine

## 2023-08-01 DIAGNOSIS — I213 ST elevation (STEMI) myocardial infarction of unspecified site: Secondary | ICD-10-CM | POA: Diagnosis not present

## 2023-08-02 ENCOUNTER — Telehealth (HOSPITAL_COMMUNITY): Payer: Self-pay

## 2023-08-02 NOTE — Telephone Encounter (Signed)
 Patient called stating she has doctor's appt tomorrow and will not be in for 11:45am class. Will be back on Friday.

## 2023-08-03 ENCOUNTER — Encounter (HOSPITAL_COMMUNITY)

## 2023-08-05 ENCOUNTER — Encounter (HOSPITAL_COMMUNITY)
Admission: RE | Admit: 2023-08-05 | Discharge: 2023-08-05 | Disposition: A | Discharge status: Home / Self Care | Source: Ambulatory Visit | Attending: Internal Medicine | Admitting: Internal Medicine

## 2023-08-05 DIAGNOSIS — I213 ST elevation (STEMI) myocardial infarction of unspecified site: Secondary | ICD-10-CM | POA: Diagnosis not present

## 2023-08-08 ENCOUNTER — Encounter (HOSPITAL_COMMUNITY)
Admission: RE | Admit: 2023-08-08 | Discharge: 2023-08-08 | Disposition: A | Discharge status: Home / Self Care | Source: Ambulatory Visit | Attending: Internal Medicine | Admitting: Internal Medicine

## 2023-08-08 DIAGNOSIS — I213 ST elevation (STEMI) myocardial infarction of unspecified site: Secondary | ICD-10-CM

## 2023-08-10 ENCOUNTER — Encounter (HOSPITAL_COMMUNITY)
Admission: RE | Admit: 2023-08-10 | Discharge: 2023-08-10 | Disposition: A | Discharge status: Home / Self Care | Source: Ambulatory Visit | Attending: Internal Medicine | Admitting: Internal Medicine

## 2023-08-10 DIAGNOSIS — I213 ST elevation (STEMI) myocardial infarction of unspecified site: Secondary | ICD-10-CM | POA: Diagnosis not present

## 2023-08-12 ENCOUNTER — Encounter (HOSPITAL_COMMUNITY)
Admission: RE | Admit: 2023-08-12 | Discharge: 2023-08-12 | Disposition: A | Discharge status: Home / Self Care | Source: Ambulatory Visit | Attending: Internal Medicine

## 2023-08-12 DIAGNOSIS — I213 ST elevation (STEMI) myocardial infarction of unspecified site: Secondary | ICD-10-CM | POA: Diagnosis not present

## 2023-08-15 ENCOUNTER — Encounter (HOSPITAL_COMMUNITY)
Admission: RE | Admit: 2023-08-15 | Discharge: 2023-08-15 | Disposition: A | Discharge status: Home / Self Care | Source: Ambulatory Visit | Attending: Internal Medicine

## 2023-08-15 DIAGNOSIS — I213 ST elevation (STEMI) myocardial infarction of unspecified site: Secondary | ICD-10-CM | POA: Diagnosis not present

## 2023-08-17 ENCOUNTER — Encounter (HOSPITAL_COMMUNITY)
Admission: RE | Admit: 2023-08-17 | Discharge: 2023-08-17 | Disposition: A | Discharge status: Home / Self Care | Source: Ambulatory Visit | Attending: Internal Medicine

## 2023-08-17 DIAGNOSIS — I213 ST elevation (STEMI) myocardial infarction of unspecified site: Secondary | ICD-10-CM

## 2023-08-19 ENCOUNTER — Encounter (HOSPITAL_COMMUNITY)
Admission: RE | Admit: 2023-08-19 | Discharge: 2023-08-19 | Disposition: A | Discharge status: Home / Self Care | Source: Ambulatory Visit | Attending: Internal Medicine | Admitting: Internal Medicine

## 2023-08-19 DIAGNOSIS — I213 ST elevation (STEMI) myocardial infarction of unspecified site: Secondary | ICD-10-CM | POA: Diagnosis not present

## 2023-08-22 ENCOUNTER — Encounter (HOSPITAL_COMMUNITY)
Admission: RE | Admit: 2023-08-22 | Discharge: 2023-08-22 | Disposition: A | Discharge status: Home / Self Care | Source: Ambulatory Visit | Attending: Internal Medicine | Admitting: Internal Medicine

## 2023-08-22 DIAGNOSIS — I213 ST elevation (STEMI) myocardial infarction of unspecified site: Secondary | ICD-10-CM | POA: Diagnosis not present

## 2023-08-23 NOTE — Progress Notes (Signed)
 Cardiac Individual Treatment Plan  Patient Details  Name: [name] MRN: [id number] Date of Birth: 10-26-1960 Referring Provider:   Flowsheet Row INTENSIVE CARDIAC REHAB ORIENT from 06/23/2023 in [facility]  Referring Provider Dr. Alyssa Backbone, MD       Initial Encounter Date:  Flowsheet Row INTENSIVE CARDIAC REHAB ORIENT from 06/23/2023 in [facility]  Date 06/23/23       Visit Diagnosis: 05/31/23 ST elevation myocardial infarction (STEMI)  Patient's Home Medications on Admission:  Current Outpatient Medications:    albuterol  (PROVENTIL ) (2.5 MG/3ML) 0.083% nebulizer solution, Take 3 mLs (2.5 mg total) by nebulization every 6 (six) hours as needed for wheezing or shortness of breath., Disp: 75 mL, Rfl: 12   albuterol  (VENTOLIN  HFA) 108 (90 Base) MCG/ACT inhaler, USE 1 TO 2 INHALATIONS EVERY 6 HOURS AS NEEDED FOR SHORTNESS OF BREATH, Disp: 25.5 g, Rfl: 3   ALPRAZolam  (XANAX ) 0.5 MG tablet, Take 0.5 mg by mouth at bedtime., Disp: , Rfl:    aspirin  81 MG chewable tablet, Chew 1 tablet (81 mg total) by mouth daily., Disp: 90 tablet, Rfl: 3   Cholecalciferol (VITAMIN D -3 PO), Take 1 tablet by mouth in the morning., Disp: , Rfl:    clopidogrel  (PLAVIX ) 75 MG tablet, Take 1 tablet (75 mg total) by mouth daily with breakfast., Disp: 90 tablet, Rfl: 3   FLUoxetine  (PROZAC ) 20 MG capsule, Take 20 mg by mouth at bedtime., Disp: , Rfl:    fluticasone -salmeterol (WIXELA INHUB) 100-50 MCG/ACT AEPB, Inhale 1 puff then rinse mouth, once daily (Patient taking differently: Inhale 1 puff into the lungs 2 (two) times daily.), Disp: 180 each, Rfl: 4   MAGNESIUM  PO, Take 1 tablet by mouth in the morning., Disp: , Rfl:    metoprolol  succinate (TOPROL  XL) 25 MG 24 hr tablet, Take 1 tablet (25 mg total) by mouth daily., Disp: 90 tablet, Rfl: 3   nitroGLYCERIN  (NITROSTAT ) 0.4 MG SL  tablet, Place 1 tablet (0.4 mg total) under the tongue every 5 (five) minutes as needed. (Patient not taking: Reported on 06/27/2023), Disp: 25 tablet, Rfl: 3   OLANZapine -FLUoxetine  (SYMBYAX ) 3-25 MG capsule, Take 1 capsule by mouth at bedtime., Disp: , Rfl:    PSYLLIUM HUSK PO, Take 2 capsules by mouth in the morning and at bedtime., Disp: , Rfl:    rosuvastatin  (CRESTOR ) 40 MG tablet, Take 1 tablet (40 mg total) by mouth daily., Disp: 90 tablet, Rfl: 3   WEGOVY  2.4 MG/0.75ML SOAJ, Inject 2.4 mg into the skin once a week., Disp: 3 mL, Rfl: 5  Past Medical History: Past Medical History:  Diagnosis Date   Anxiety    psychiatrist: Dr. Gavin Kast    Arthritis    Asthma    Bronchitis    Colitis    Colon polyps    COVID-19 09/2020   Depression    psychiatrist: Dr. Gavin Kast   Elevated liver enzymes    Dr. Angelo Kennedy following   Essential hypertension, benign 03/05/2015   Fatty liver    GERD (gastroesophageal reflux disease)    Heart murmur    History of chicken pox    History of kidney stones    Hypertension    Multiple pulmonary nodules determined by computed tomography of lung 03/07/2014   Last CT suggested clearance of tree-in-bud.  Follows with Dr. Rupert Counts     Chest CT [facility] 2015-micronodules, groundglass, tree in bud  right upper lobe  CT chest 03/15/16 No evidence of interstitial lung disease or mycobacterium aviumcomplex. Scattered pulmonary nodules. No f/u needed- low risk   Osteoarthritis of right knee 07/29/2021   Postmenopausal bleeding 06/27/2020   Primary osteoarthritis of both knees 04/09/2011   Formatting of this note might be different from the original. Overview:  S/p left total knee replacement   Renal cell carcinoma    hx of left and right; Dr. Madelin Schatz   Trochanteric bursitis, left hip 09/27/2018    Tobacco Use: Social History   Tobacco Use  Smoking Status Never   Passive exposure: Never  Smokeless Tobacco Never    Labs: Review Flowsheet  More data exists       Latest Ref Rng & Units 06/05/2021 05/07/2022 10/22/2022 05/31/2023 07/28/2023  Labs for ITP Cardiac and Pulmonary Rehab  Cholestrol 100 - 199 mg/dL 161  096  - 045  409   LDL (calc) 0 - 99 mg/dL 811  914  - 782  49   HDL-C >39 mg/dL 95.62  13.08  - 45  46   Trlycerides 0 - 149 mg/dL 657.8  469.6  - 51  70   Hemoglobin A1c 4.8 - 5.6 % 5.7  6.3  5.2  5.2  5.2  5.2  5.1  -  TCO2 22 - 32 mmol/L - - - 22  -    Details       Multiple values from one day are sorted in reverse-chronological order         Capillary Blood Glucose: No results found for: "GLUCAP"   Exercise Target Goals: Exercise Program Goal: Individual exercise prescription set using results from initial 6 min walk test and THRR while considering  patient's activity barriers and safety.   Exercise Prescription Goal: Initial exercise prescription builds to 30-45 minutes a day of aerobic activity, 2-3 days per week.  Home exercise guidelines will be given to patient during program as part of exercise prescription that the participant will acknowledge.  Activity Barriers & Risk Stratification:  Activity Barriers & Cardiac Risk Stratification - 06/23/23 1518       Activity Barriers & Cardiac Risk Stratification   Activity Barriers Left Knee Replacement;Right Knee Replacement;Joint Problems;Balance Concerns;History of Falls;Back Problems;Neck/Spine Problems;Deconditioning    Cardiac Risk Stratification High             6 Minute Walk:  6 Minute Walk     Row Name 06/23/23 1106         6 Minute Walk   Phase Initial     Distance 1200 feet     Walk Time 0 minutes     # of Rest Breaks 0     MPH 2.3     METS 2.73     RPE 11     Perceived Dyspnea  0     VO2 Peak 9.6     Symptoms No     Resting HR 62 bpm     Resting BP 124/80     Resting Oxygen Saturation  97 %     Exercise Oxygen Saturation  during 6 min walk 99 %     Max Ex. HR 81 bpm     Max Ex. BP 128/84     2 Minute Post BP 120/84              Oxygen  Initial Assessment:   Oxygen Re-Evaluation:   Oxygen Discharge (Final Oxygen Re-Evaluation):   Initial Exercise Prescription:  Initial Exercise  Prescription - 06/23/23 1500       Date of Initial Exercise RX and Referring Provider   Date 06/23/23    Referring Provider Dr. Alyssa Backbone, MD    Expected Discharge Date 09/14/23      Recumbant Bike   Level 1    RPM 60    Watts 15    Minutes 15    METs 2      NuStep   Level 1    SPM 80    Minutes 15    METs 1.7      Prescription Details   Frequency (times per week) 3    Duration Progress to 30 minutes of continuous aerobic without signs/symptoms of physical distress      Intensity   THRR 40-80% of Max Heartrate 63-126    Ratings of Perceived Exertion 11-13    Perceived Dyspnea 0-4      Progression   Progression Continue progressive overload as per policy without signs/symptoms or physical distress.      Resistance Training   Training Prescription Yes    Weight 2 lbs wts    Reps 10-15             Perform Capillary Blood Glucose checks as needed.  Exercise Prescription Changes:   Exercise Prescription Changes     Row Name 06/29/23 1300 06/29/23 1400 07/22/23 1600 07/27/23 1600 08/05/23 1600     Response to Exercise   Blood Pressure (Admit) -- 122/78 124/70 116/72 98/66   Blood Pressure (Exercise) -- 134/70 120/76 120/62 --   Blood Pressure (Exit) -- 124/68 102/68 114/72 106/56   Heart Rate (Admit) -- 58 bpm 60 bpm 66 bpm 64 bpm   Heart Rate (Exercise) -- 102 bpm 100 bpm 103 bpm 100 bpm   Heart Rate (Exit) -- 68 bpm 69 bpm 74 bpm 74 bpm   Rating of Perceived Exertion (Exercise) -- 8 12 12 12    Symptoms -- None None None None   Comments -- Pt's first day in the CRP2 program Reviewed METs and goals Reviewed Home exercise Rx Reviewed METs   Duration -- Continue with 30 min of aerobic exercise without signs/symptoms of physical distress. Continue with 30 min of aerobic exercise without signs/symptoms of  physical distress. Continue with 30 min of aerobic exercise without signs/symptoms of physical distress. Continue with 30 min of aerobic exercise without signs/symptoms of physical distress.   Intensity -- THRR unchanged THRR unchanged THRR unchanged THRR unchanged     Progression   Progression -- Continue to progress workloads to maintain intensity without signs/symptoms of physical distress. Continue to progress workloads to maintain intensity without signs/symptoms of physical distress. Continue to progress workloads to maintain intensity without signs/symptoms of physical distress. Continue to progress workloads to maintain intensity without signs/symptoms of physical distress.   Average METs -- 2.1 3.37 3.3 3.35     Resistance Training   Training Prescription -- No Yes No Yes   Weight -- No weights on wednesdays 3 lbs No weights on Wednesdays 3 lbs   Reps -- -- 10-15 -- 10-15   Time -- -- 10 Minutes -- 5 Minutes     Interval Training   Interval Training -- No No No No     Recumbant Bike   Level -- 1 4 4 5    RPM -- 54 47 39 44   Watts -- 12 48 40 10   Minutes -- 15 15 15 15    METs -- 1.9 4.04 3.6  3.8     NuStep   Level -- 1 4 4 4    SPM -- 81 74 93 100   Minutes -- 15 15 15 15    METs -- 2.2 2.7 3 2.9     Home Exercise Plan   Plans to continue exercise at -- -- -- Home (comment) Home (comment)   Frequency -- -- -- Add 2 additional days to program exercise sessions. Add 2 additional days to program exercise sessions.   Initial Home Exercises Provided -- -- -- 07/27/23 07/27/23    Row Name 08/19/23 1400             Response to Exercise   Blood Pressure (Admit) 100/70       Blood Pressure (Exit) 102/64       Heart Rate (Admit) 62 bpm       Heart Rate (Exercise) 104 bpm       Heart Rate (Exit) 71 bpm       Rating of Perceived Exertion (Exercise) 12       Symptoms None       Comments Reviewed METs/Goals       Duration Continue with 30 min of aerobic exercise without  signs/symptoms of physical distress.       Intensity THRR unchanged         Progression   Progression Continue to progress workloads to maintain intensity without signs/symptoms of physical distress.       Average METs 3.7         Resistance Training   Training Prescription Yes       Weight 4 lbs       Reps 10-15       Time 5 Minutes         Interval Training   Interval Training No         Recumbant Bike   Level 5       RPM 51       Watts 52       Minutes 15       METs 42         NuStep   Level 5       SPM 87       Minutes 15       METs 3.2                Exercise Comments:   Exercise Comments     Row Name 06/29/23 1413 07/22/23 1651 07/27/23 1633 08/05/23 1634 08/19/23 1421   Exercise Comments Pt's first day in the CRP2 program. No complaints with todays sesssion. Pt voiced that bike was too easy, will increase to level 2 next session. Reviewed METs and goals. Pt is making progress on goals and METs. No changes in exercise Rx today. Reviewed Home exercise Rx. Pt is walking at home 2x/week for 30 and will continue this. Pt is considering going back to her gym to do more resistance training. Pt verbalized understanding of the home exercise Rx and was provided a copy. Reviewed METs. Pt is making progress. Increased workload on bike today. Reviewed METs and Goals. Pt reports that she is learning a lot about her heart, exercise, mental health, and diet in the CR education classes. Pt is performing a similar exericse routine at the gym. She enjoys the NS and is considering buying one for home. Pt reports improvement in flexibility, balance, and stamina. Pt making good progress with exercise. She is reporting some L ankle discomfort when doing  ankle rotations. I advised her to stop performing these stretches and f/u with a ankle specialist.            Exercise Goals and Review:   Exercise Goals     Row Name 06/23/23 1513             Exercise Goals   Increase  Physical Activity Yes       Intervention Provide advice, education, support and counseling about physical activity/exercise needs.;Develop an individualized exercise prescription for aerobic and resistive training based on initial evaluation findings, risk stratification, comorbidities and participant's personal goals.       Expected Outcomes Short Term: Attend rehab on a regular basis to increase amount of physical activity.;Long Term: Exercising regularly at least 3-5 days a week.;Long Term: Add in home exercise to make exercise part of routine and to increase amount of physical activity.       Increase Strength and Stamina Yes       Intervention Provide advice, education, support and counseling about physical activity/exercise needs.;Develop an individualized exercise prescription for aerobic and resistive training based on initial evaluation findings, risk stratification, comorbidities and participant's personal goals.       Expected Outcomes Short Term: Increase workloads from initial exercise prescription for resistance, speed, and METs.;Short Term: Perform resistance training exercises routinely during rehab and add in resistance training at home;Long Term: Improve cardiorespiratory fitness, muscular endurance and strength as measured by increased METs and functional capacity ( )       Able to understand and use rate of perceived exertion (RPE) scale Yes       Intervention Provide education and explanation on how to use RPE scale       Expected Outcomes Short Term: Able to use RPE daily in rehab to express subjective intensity level;Long Term:  Able to use RPE to guide intensity level when exercising independently       Knowledge and understanding of Target Heart Rate Range (THRR) Yes       Intervention Provide education and explanation of THRR including how the numbers were predicted and where they are located for reference       Expected Outcomes Short Term: Able to state/look up THRR;Long  Term: Able to use THRR to govern intensity when exercising independently;Short Term: Able to use daily as guideline for intensity in rehab       Understanding of Exercise Prescription Yes       Intervention Provide education, explanation, and written materials on patient's individual exercise prescription       Expected Outcomes Short Term: Able to explain program exercise prescription;Long Term: Able to explain home exercise prescription to exercise independently                Exercise Goals Re-Evaluation :  Exercise Goals Re-Evaluation     Row Name 06/29/23 1411 07/22/23 1649 08/19/23 1413         Exercise Goal Re-Evaluation   Exercise Goals Review Increase Physical Activity;Increase Strength and Stamina;Able to understand and use rate of perceived exertion (RPE) scale;Knowledge and understanding of Target Heart Rate Range (THRR);Understanding of Exercise Prescription Increase Physical Activity;Increase Strength and Stamina;Able to understand and use rate of perceived exertion (RPE) scale;Knowledge and understanding of Target Heart Rate Range (THRR);Understanding of Exercise Prescription Increase Physical Activity;Increase Strength and Stamina;Able to understand and use rate of perceived exertion (RPE) scale;Knowledge and understanding of Target Heart Rate Range (THRR);Understanding of Exercise Prescription     Comments Pt's first day in the  CRP2 program. Pt understands the Exercise Rx, RPE scale and THHR Reviewed METs and goals. Pt voices improvement in her knowlege of heart health though the education classes. Pt likes her exercise routine that she is doing her, which is also a goal. we will discuss home exercise soon. Reviewed METs and Goals. Pt reports that she is learning a lot about her heart, exercise, mental health, and diet in the CR education classes. Pt is performing a similar exericse routine at the gym. She enjoys the NS and is considering buying one for home. Pt reports  improvement in flexibility, balance, and stamina.     Expected Outcomes Will continue to monitor patient and progress exercise workloads as tolerated. Will continue to monitor patient and progress exercise workloads as tolerated. Will continue to monitor patient and progress exercise workloads as tolerated.              Discharge Exercise Prescription (Final Exercise Prescription Changes):  Exercise Prescription Changes - 08/19/23 1400       Response to Exercise   Blood Pressure (Admit) 100/70    Blood Pressure (Exit) 102/64    Heart Rate (Admit) 62 bpm    Heart Rate (Exercise) 104 bpm    Heart Rate (Exit) 71 bpm    Rating of Perceived Exertion (Exercise) 12    Symptoms None    Comments Reviewed METs/Goals    Duration Continue with 30 min of aerobic exercise without signs/symptoms of physical distress.    Intensity THRR unchanged      Progression   Progression Continue to progress workloads to maintain intensity without signs/symptoms of physical distress.    Average METs 3.7      Resistance Training   Training Prescription Yes    Weight 4 lbs    Reps 10-15    Time 5 Minutes      Interval Training   Interval Training No      Recumbant Bike   Level 5    RPM 51    Watts 52    Minutes 15    METs 42      NuStep   Level 5    SPM 87    Minutes 15    METs 3.2             Nutrition:  Target Goals: Understanding of nutrition guidelines, daily intake of sodium 1500mg , cholesterol 200mg , calories 30% from fat and 7% or less from saturated fats, daily to have 5 or more servings of fruits and vegetables.  Biometrics:  Pre Biometrics - 06/23/23 1030       Pre Biometrics   Waist Circumference 36.5 inches    Hip Circumference 44 inches    Waist to Hip Ratio 0.83 %    Triceps Skinfold 15 mm    % Body Fat 37.4 %    Grip Strength 23 kg    Flexibility 10 in    Single Leg Stand 11.5 seconds              Nutrition Therapy Plan and Nutrition Goals:   Nutrition Therapy & Goals - 07/29/23 1437       Nutrition Therapy   Diet Heart Healthy Diet    Drug/Food Interactions Statins/Certain Fruits      Personal Nutrition Goals   Nutrition Goal Patient to identify strategies for reducing cardiovascular risk by attending the [employer] education and nutrition series weekly.   goal in action.   Personal Goal #2 Patient to improve diet quality by using  the plate method as a guide for meal planning to include lean protein/plant protein, fruits, vegetables, whole grains, nonfat dairy as part of a well-balanced diet.   goal in action.   Comments Goals in progress. Annisa has medical history of HTN, CAD, STEMI, renal cell carcinmoa s/p partial L nephrectomy. She has been taking wegovy  for weight loss; per documentation, she is down ~45# over the last year (91.4kg on 05/21/2022). LDL has improved to goal of <55 (49). A1c is well controlled in a normal range, previously in a prediabetic range but has improved with weight loss/wegovy . She has maintained her weight since starting with our program. Patient will benefit from participation in intensive cardiac rehab for nutrition, exercise, and lifestyle modification.      Intervention Plan   Intervention Prescribe, educate and counsel regarding individualized specific dietary modifications aiming towards targeted core components such as weight, hypertension, lipid management, diabetes, heart failure and other comorbidities.;Nutrition handout(s) given to patient.    Expected Outcomes Short Term Goal: Understand basic principles of dietary content, such as calories, fat, sodium, cholesterol and nutrients.;Long Term Goal: Adherence to prescribed nutrition plan.             Nutrition Assessments:  Nutrition Assessments - 07/04/23 1408       Rate Your Plate Scores   Pre Score 56            MEDIFICTS Score Key: >=70 Need to make dietary changes  40-70 Heart Healthy Diet <= 40 Therapeutic Level Cholesterol  Diet   Flowsheet Row INTENSIVE CARDIAC REHAB from 07/04/2023 in [facility]  Picture Your Plate Total Score on Admission 56      Picture Your Plate Scores: <19 Unhealthy dietary pattern with much room for improvement. 41-50 Dietary pattern unlikely to meet recommendations for good health and room for improvement. 51-60 More healthful dietary pattern, with some room for improvement.  >60 Healthy dietary pattern, although there may be some specific behaviors that could be improved.    Nutrition Goals Re-Evaluation:  Nutrition Goals Re-Evaluation     Row Name 06/30/23 1533 07/29/23 1437           Goals   Current Weight 156 lb 1.4 oz (70.8 kg) 156 lb 8.4 oz (71 kg)      Comment LDL 106, A1c WNL lipids WNL, LDL 49, AST 58, ALT 72, A1c WNL      Expected Outcome Romona has medical history of HTN, CAD, STEMI, renal cell carcinmoa s/p partial L nephrectomy. She has been taking wegovy  for weight loss; per documentation, she is down ~45# over the last year (91.4kg on 05/21/2022). LDL remains above goal of <14. A1c is well controlled in a normal range, previously in a prediabetic range but has improved with weight loss/wegovy . Patient will benefit from participation in intensive cardiac rehab for nutrition, exercise, and lifestyle modification. Goals in progress. Kayce has medical history of HTN, CAD, STEMI, renal cell carcinmoa s/p partial L nephrectomy. She has been taking wegovy  for weight loss; per documentation, she is down ~45# over the last year (91.4kg on 05/21/2022). LDL has improved to goal of <55 (49). A1c is well controlled in a normal range, previously in a prediabetic range but has improved with weight loss/wegovy . She has maintained her weight since starting with our program. Patient will benefit from participation in intensive cardiac rehab for nutrition, exercise, and lifestyle modification.  Nutrition Goals  Re-Evaluation:  Nutrition Goals Re-Evaluation     Row Name 06/30/23 1533 07/29/23 1437           Goals   Current Weight 156 lb 1.4 oz (70.8 kg) 156 lb 8.4 oz (71 kg)      Comment LDL 106, A1c WNL lipids WNL, LDL 49, AST 58, ALT 72, A1c WNL      Expected Outcome Mckinnley has medical history of HTN, CAD, STEMI, renal cell carcinmoa s/p partial L nephrectomy. She has been taking wegovy  for weight loss; per documentation, she is down ~45# over the last year (91.4kg on 05/21/2022). LDL remains above goal of <16. A1c is well controlled in a normal range, previously in a prediabetic range but has improved with weight loss/wegovy . Patient will benefit from participation in intensive cardiac rehab for nutrition, exercise, and lifestyle modification. Goals in progress. Maevis has medical history of HTN, CAD, STEMI, renal cell carcinmoa s/p partial L nephrectomy. She has been taking wegovy  for weight loss; per documentation, she is down ~45# over the last year (91.4kg on 05/21/2022). LDL has improved to goal of <55 (49). A1c is well controlled in a normal range, previously in a prediabetic range but has improved with weight loss/wegovy . She has maintained her weight since starting with our program. Patient will benefit from participation in intensive cardiac rehab for nutrition, exercise, and lifestyle modification.               Nutrition Goals Discharge (Final Nutrition Goals Re-Evaluation):  Nutrition Goals Re-Evaluation - 07/29/23 1437       Goals   Current Weight 156 lb 8.4 oz (71 kg)    Comment lipids WNL, LDL 49, AST 58, ALT 72, A1c WNL    Expected Outcome Goals in progress. Keana has medical history of HTN, CAD, STEMI, renal cell carcinmoa s/p partial L nephrectomy. She has been taking wegovy  for weight loss; per documentation, she is down ~45# over the last year (91.4kg on 05/21/2022). LDL has improved to goal of <55 (49). A1c is well controlled in a normal range, previously in a prediabetic  range but has improved with weight loss/wegovy . She has maintained her weight since starting with our program. Patient will benefit from participation in intensive cardiac rehab for nutrition, exercise, and lifestyle modification.             Psychosocial: Target Goals: Acknowledge presence or absence of significant depression and/or stress, maximize coping skills, provide positive support system. Participant is able to verbalize types and ability to use techniques and skills needed for reducing stress and depression.  Initial Review & Psychosocial Screening:  Initial Psych Review & Screening - 06/23/23 1604       Initial Review   Current issues with Current Depression;History of Depression;Current Anxiety/Panic;Current Stress Concerns    Source of Stress Concerns Chronic Illness      Family Dynamics   Good Support System? Yes      Barriers   Psychosocial barriers to participate in program The patient should benefit from training in stress management and relaxation.;Psychosocial barriers identified (see note)      Screening Interventions   Interventions Encouraged to exercise;To provide support and resources with identified psychosocial needs;Provide feedback about the scores to participant;Program counselor consult    Expected Outcomes Short Term goal: Utilizing psychosocial counselor, staff and physician to assist with identification of specific Stressors or current issues interfering with healing process. Setting desired goal for each stressor or current issue identified.;Long Term Goal:  Stressors or current issues are controlled or eliminated.;Short Term goal: Identification and review with participant of any Quality of Life or Depression concerns found by scoring the questionnaire.;Long Term goal: The participant improves quality of Life and PHQ9 Scores as seen by post scores and/or verbalization of changes   Nurse aware and will provide counseling resources for pt next week when she  comes for exercise.            Quality of Life Scores:  Quality of Life - 06/23/23 1511       Quality of Life   Select Quality of Life      Quality of Life Scores   Health/Function Pre 24 %    Socioeconomic Pre 30 %    Psych/Spiritual Pre 24 %    Family Pre 30 %    GLOBAL Pre 26 %            Scores of 19 and below usually indicate a poorer quality of life in these areas.  A difference of  2-3 points is a clinically meaningful difference.  A difference of 2-3 points in the total score of the Quality of Life Index has been associated with significant improvement in overall quality of life, self-image, physical symptoms, and general health in studies assessing change in quality of life.  PHQ-9: Review Flowsheet  More data exists      06/27/2023 06/23/2023 11/17/2022 10/20/2022 07/22/2022  Depression screen PHQ 2/9  Decreased Interest 0 0 0 0 0  Down, Depressed, Hopeless 0 1 0 0 1  PHQ - 2 Score 0 1 0 0 1  Altered sleeping 0 0 - - -  Tired, decreased energy 1 1 - - -  Change in appetite 0 0 - - -  Feeling bad or failure about yourself  0 0 - - -  Trouble concentrating 0 0 - - -  Moving slowly or fidgety/restless 0 0 - - -  Suicidal thoughts 0 0 - - -  PHQ-9 Score 1 2 - - -  Difficult doing work/chores Not difficult at all Not difficult at all - - -   Interpretation of Total Score  Total Score Depression Severity:  1-4 = Minimal depression, 5-9 = Mild depression, 10-14 = Moderate depression, 15-19 = Moderately severe depression, 20-27 = Severe depression   Psychosocial Evaluation and Intervention:   Psychosocial Re-Evaluation:  Psychosocial Re-Evaluation     Row Name 06/30/23 0958 07/26/23 1610 08/23/23 1541         Psychosocial Re-Evaluation   Current issues with Current Depression;History of Depression;Current Stress Concerns;Current Anxiety/Panic;Current Psychotropic Meds Current Depression;History of Depression;Current Stress Concerns;Current  Anxiety/Panic;Current Psychotropic Meds Current Depression;History of Depression;Current Stress Concerns;Current Anxiety/Panic;Current Psychotropic Meds     Comments Pt has a history of depression and says that the current antidepressants she is taking is controlling her depression. Maleta says that she has felt  anxious since her Sharlon Deacon and is says she worried if she travels out of [employer] [place] will she get the same care although Colon attend church in [place]. Reassured the patient that our staff is adequately trained and will provide good care to her while she participates in cardiac rehab. Dorotha says that she has received counseling in the past and would like to talk to a counselor. Will send referral to [facility]. Will notify patient's primary care provider Dr Marylee Snowball. Pt has a history of depression and says that the current antidepressants she is taking is controlling her depression. Valinda Gault  has not voiced any increased concerns or stressors during exercise at cardiac rehab Anija has not voiced any increased concerns or stressors during exercise at cardiac rehab. Devinn says she has been meeting with a counselor and has found it helpful     Expected Outcomes Renee will have decreased or controlled depression/ anxiety  upon completion of cardiac Jurni will have decreased or controlled depression/ anxiety  upon completion of cardiac Charlei will have decreased or controlled depression/ anxiety  upon completion of cardiac     Interventions Therapist referral;Stress management education;Encouraged to attend Cardiac Rehabilitation for the exercise;Physician referral;Relaxation education Therapist referral;Stress management education;Encouraged to attend Cardiac Rehabilitation for the exercise;Physician referral;Relaxation education Therapist referral;Stress management education;Encouraged to attend Cardiac Rehabilitation for the exercise;Physician referral;Relaxation education      Continue Psychosocial Services  Follow up required by staff Follow up required by staff Follow up required by staff       Initial Review   Source of Stress Concerns Chronic Illness;Unable to perform yard/household activities;Poor Coping Skills Chronic Illness;Unable to perform yard/household activities;Poor Coping Skills Chronic Illness;Unable to perform yard/household activities;Poor Coping Skills     Comments Will continue to monitor and offer support as needed. Will continue to monitor and offer support as needed. Will continue to monitor and offer support as needed.              Psychosocial Discharge (Final Psychosocial Re-Evaluation):  Psychosocial Re-Evaluation - 08/23/23 1541       Psychosocial Re-Evaluation   Current issues with Current Depression;History of Depression;Current Stress Concerns;Current Anxiety/Panic;Current Psychotropic Meds    Comments Ziyana has not voiced any increased concerns or stressors during exercise at cardiac rehab. Bennetta says she has been meeting with a counselor and has found it helpful    Expected Outcomes Tykesha will have decreased or controlled depression/ anxiety  upon completion of cardiac    Interventions Therapist referral;Stress management education;Encouraged to attend Cardiac Rehabilitation for the exercise;Physician referral;Relaxation education    Continue Psychosocial Services  Follow up required by staff      Initial Review   Source of Stress Concerns Chronic Illness;Unable to perform yard/household activities;Poor Coping Skills    Comments Will continue to monitor and offer support as needed.             Vocational Rehabilitation: Provide vocational rehab assistance to qualifying candidates.   Vocational Rehab Evaluation & Intervention:  Vocational Rehab - 06/23/23 1528       Initial Vocational Rehab Evaluation & Intervention   Assessment shows need for Vocational Rehabilitation No   Pt is retired             Education: Education Goals: Education classes will be provided on a weekly basis, covering required topics. Participant will state understanding/return demonstration of topics presented.    Education     Row Name 06/29/23 1300     Education   Cardiac Education Topics Pritikin   Orthoptist   Educator Dietitian   Weekly Topic Comforting Weekend Breakfasts   Instruction Review Code 1- Verbalizes Understanding   Class Start Time 1145   Class Stop Time 1228   Class Time Calculation (min) 43 min    Row Name 07/01/23 1300     Education   Cardiac Education Topics Pritikin   Licensed conveyancer Nutrition   Nutrition Dining Out - Part 1   Instruction Review  Code 1- Verbalizes Understanding   Class Start Time 1145   Class Stop Time 1222   Class Time Calculation (min) 37 min    Row Name 07/04/23 1100     Education   Cardiac Education Topics Pritikin   Select Core Videos     Core Videos   Educator Exercise Physiologist   Select Exercise Education   Exercise Education Biomechanial Limitations   Instruction Review Code 1- Verbalizes Understanding   Class Start Time 1145   Class Stop Time 1218   Class Time Calculation (min) 33 min    Row Name 07/06/23 1200     Education   Cardiac Education Topics Pritikin   Customer service manager   Weekly Topic Fast Evening Meals   Instruction Review Code 1- Verbalizes Understanding   Class Start Time 1145   Class Stop Time 1225   Class Time Calculation (min) 40 min    Row Name 07/11/23 1500     Education   Cardiac Education Topics Pritikin   Glass blower/designer Nutrition   Nutrition Workshop Fueling a Forensic psychologist   Instruction Review Code 1- TEFL teacher Understanding   Class Start Time 1145   Class Stop Time 1230   Class Time Calculation (min) 45 min    Row Name  07/13/23 1500     Education   Cardiac Education Topics Pritikin   Customer service manager   Weekly Topic International Cuisine- Spotlight on the [employer] Zones   Instruction Review Code 1- Verbalizes Understanding   Class Start Time 1145   Class Stop Time 1225   Class Time Calculation (min) 40 min    Row Name 07/15/23 1100     Education   Cardiac Education Topics Pritikin   Psychologist, forensic Exercise Education   Exercise Education Improving Performance   Instruction Review Code 1- Verbalizes Understanding   Class Start Time 1145   Class Stop Time 1225   Class Time Calculation (min) 40 min    Row Name 07/18/23 1600     Education   Cardiac Education Topics Pritikin   Geographical information systems officer Psychosocial   Psychosocial Workshop Healthy Sleep for a Healthy Heart   Instruction Review Code 1- Verbalizes Understanding   Class Start Time 1148   Class Stop Time 1234   Class Time Calculation (min) 46 min    Row Name 07/22/23 1200     Education   Cardiac Education Topics Pritikin   Nurse, children's Exercise Physiologist   Select Psychosocial   Psychosocial How Our Thoughts Can Heal Our Hearts   Instruction Review Code 1- Verbalizes Understanding   Class Start Time 1146   Class Stop Time 1220   Class Time Calculation (min) 34 min    Row Name 07/25/23 1300     Education   Cardiac Education Topics Pritikin   Select Workshops     Workshops   Educator Exercise Physiologist   Select Exercise   Exercise Workshop Managing Heart Disease: Your Path to a Healthier Heart   Instruction Review Code 1- Verbalizes Understanding   Class Start Time 1145   Class Stop Time  1237   Class Time Calculation (min) 52 min    Row Name 07/27/23 1200     Education   Cardiac Education Topics Pritikin   Research scientist (life sciences)   Weekly Topic Powerhouse Plant-Based Proteins   Instruction Review Code 1- Verbalizes Understanding   Class Start Time 1145   Class Stop Time 1222   Class Time Calculation (min) 37 min    Row Name 07/29/23 1300     Education   Cardiac Education Topics Pritikin   Administrator, Civil Service Education   General Education Hypertension and Heart Disease   Instruction Review Code 1- Verbalizes Understanding   Class Start Time 1145   Class Stop Time 1225   Class Time Calculation (min) 40 min    Row Name 08/01/23 1300     Education   Cardiac Education Topics Pritikin   Geographical information systems officer Psychosocial   Psychosocial Workshop From Head to Heart: The Power of a Healthy Outlook   Instruction Review Code 1- Verbalizes Understanding   Class Start Time 1147   Class Stop Time 1234   Class Time Calculation (min) 47 min    Row Name 08/05/23 1200     Education   Cardiac Education Topics Pritikin   Hospital doctor Education   General Education Heart Disease Risk Reduction   Instruction Review Code 1- Verbalizes Understanding   Class Start Time 1146   Class Stop Time 1223   Class Time Calculation (min) 37 min    Row Name 08/08/23 1100     Education   Cardiac Education Topics Pritikin   Nurse, children's Exercise Physiologist   Select Psychosocial   Psychosocial Healthy Minds, Bodies, Hearts   Instruction Review Code 1- Verbalizes Understanding   Class Start Time 1147   Class Stop Time 1221   Class Time Calculation (min) 34 min    Row Name 08/10/23 1200     Education   Cardiac Education Topics Pritikin   Orthoptist   Educator Dietitian   Weekly Topic Adding Flavor -  Sodium-Free   Instruction Review Code 1- Verbalizes Understanding   Class Start Time 1145   Class Stop Time 1221   Class Time Calculation (min) 36 min    Row Name 08/12/23 1300     Education   Cardiac Education Topics Pritikin   Select Workshops     Workshops   Educator Exercise Physiologist   Select Exercise   Exercise Workshop Location manager and Fall Prevention   Instruction Review Code 1- Verbalizes Understanding   Class Start Time 1145   Class Stop Time 1237   Class Time Calculation (min) 52 min    Row Name 08/15/23 1300     Education   Cardiac Education Topics Pritikin   Glass blower/designer Nutrition   Nutrition Workshop Label Reading   Instruction Review Code 1- Verbalizes Understanding   Class Start Time 1145   Class Stop Time 1228   Class Time Calculation (min) 43 min    Row Name 08/17/23 1100  Education   Cardiac Education Topics Pritikin   Nurse, children's Exercise Physiologist   Select Nutrition   Nutrition Other   Instruction Review Code 1- Verbalizes Understanding   Class Start Time 1152   Class Stop Time 1230   Class Time Calculation (min) 38 min    Row Name 08/19/23 1500     Education   Cardiac Education Topics Pritikin   Customer service manager   Weekly Topic Fast and Healthy Breakfasts   Instruction Review Code 1- Verbalizes Understanding   Class Start Time 1145   Class Stop Time 1220   Class Time Calculation (min) 35 min    Row Name 08/22/23 1100     Education   Cardiac Education Topics Pritikin   Hospital doctor Education   General Education Metabolic Syndrome and Belly Fat   Instruction Review Code 1- Verbalizes Understanding   Class Start Time 1150   Class Stop Time 1230   Class Time Calculation (min) 40 min            Core  Videos: Exercise    Move It!  Clinical staff conducted group or individual video education with verbal and written material and guidebook.  Patient learns the recommended Pritikin exercise program. Exercise with the goal of living a long, healthy life. Some of the health benefits of exercise include controlled diabetes, healthier blood pressure levels, improved cholesterol levels, improved heart and lung capacity, improved sleep, and better body composition. Everyone should speak with their doctor before starting or changing an exercise routine.  Biomechanical Limitations Clinical staff conducted group or individual video education with verbal and written material and guidebook.  Patient learns how biomechanical limitations can impact exercise and how we can mitigate and possibly overcome limitations to have an impactful and balanced exercise routine.  Body Composition Clinical staff conducted group or individual video education with verbal and written material and guidebook.  Patient learns that body composition (ratio of muscle mass to fat mass) is a key component to assessing overall fitness, rather than body weight alone. Increased fat mass, especially visceral belly fat, can put us  at increased risk for metabolic syndrome, type 2 diabetes, heart disease, and even death. It is recommended to combine diet and exercise (cardiovascular and resistance training) to improve your body composition. Seek guidance from your physician and exercise physiologist before implementing an exercise routine.  Exercise Action Plan Clinical staff conducted group or individual video education with verbal and written material and guidebook.  Patient learns the recommended strategies to achieve and enjoy long-term exercise adherence, including variety, self-motivation, self-efficacy, and positive decision making. Benefits of exercise include fitness, good health, weight management, more energy, better sleep, less  stress, and overall well-being.  Medical   Heart Disease Risk Reduction Clinical staff conducted group or individual video education with verbal and written material and guidebook.  Patient learns our heart is our most vital organ as it circulates oxygen, nutrients, white blood cells, and hormones throughout the entire body, and carries waste away. Data supports a plant-based eating plan like the Pritikin Program for its effectiveness in slowing progression of and reversing heart disease. The video provides a number of recommendations to address heart disease.   Metabolic Syndrome and Belly Fat  Clinical staff conducted group or individual video education with  verbal and written material and guidebook.  Patient learns what metabolic syndrome is, how it leads to heart disease, and how one can reverse it and keep it from coming back. You have metabolic syndrome if you have 3 of the following 5 criteria: abdominal obesity, high blood pressure, high triglycerides, low HDL cholesterol, and high blood sugar.  Hypertension and Heart Disease Clinical staff conducted group or individual video education with verbal and written material and guidebook.  Patient learns that high blood pressure, or hypertension, is very common in the [place] . Hypertension is largely due to excessive salt intake, but other important risk factors include being overweight, physical inactivity, drinking too much alcohol, smoking, and not eating enough potassium from fruits and vegetables. High blood pressure is a leading risk factor for heart attack, stroke, congestive heart failure, dementia, kidney failure, and premature death. Long-term effects of excessive salt intake include stiffening of the arteries and thickening of heart muscle and organ damage. Recommendations include ways to reduce hypertension and the risk of heart disease.  Diseases of Our Time - Focusing on Diabetes Clinical staff conducted group or individual  video education with verbal and written material and guidebook.  Patient learns why the best way to stop diseases of our time is prevention, through food and other lifestyle changes. Medicine (such as prescription pills and surgeries) is often only a Band-Aid on the problem, not a long-term solution. Most common diseases of our time include obesity, type 2 diabetes, hypertension, heart disease, and cancer. The Pritikin Program is recommended and has been proven to help reduce, reverse, and/or prevent the damaging effects of metabolic syndrome.  Nutrition   Overview of the Pritikin Eating Plan  Clinical staff conducted group or individual video education with verbal and written material and guidebook.  Patient learns about the Pritikin Eating Plan for disease risk reduction. The Pritikin Eating Plan emphasizes a wide variety of unrefined, minimally-processed carbohydrates, like fruits, vegetables, whole grains, and legumes. Go, Caution, and Stop food choices are explained. Plant-based and lean animal proteins are emphasized. Rationale provided for low sodium intake for blood pressure control, low added sugars for blood sugar stabilization, and low added fats and oils for coronary artery disease risk reduction and weight management.  Calorie Density  Clinical staff conducted group or individual video education with verbal and written material and guidebook.  Patient learns about calorie density and how it impacts the Pritikin Eating Plan. Knowing the characteristics of the food you choose will help you decide whether those foods will lead to weight gain or weight loss, and whether you want to consume more or less of them. Weight loss is usually a side effect of the Pritikin Eating Plan because of its focus on low calorie-dense foods.  Label Reading  Clinical staff conducted group or individual video education with verbal and written material and guidebook.  Patient learns about the Pritikin recommended  label reading guidelines and corresponding recommendations regarding calorie density, added sugars, sodium content, and whole grains.  Dining Out - Part 1  Clinical staff conducted group or individual video education with verbal and written material and guidebook.  Patient learns that restaurant meals can be sabotaging because they can be so high in calories, fat, sodium, and/or sugar. Patient learns recommended strategies on how to positively address this and avoid unhealthy pitfalls.  Facts on Fats  Clinical staff conducted group or individual video education with verbal and written material and guidebook.  Patient learns that lifestyle modifications can be just  as effective, if not more so, as many medications for lowering your risk of heart disease. A Pritikin lifestyle can help to reduce your risk of inflammation and atherosclerosis (cholesterol build-up, or plaque, in the artery walls). Lifestyle interventions such as dietary choices and physical activity address the cause of atherosclerosis. A review of the types of fats and their impact on blood cholesterol levels, along with dietary recommendations to reduce fat intake is also included.  Nutrition Action Plan  Clinical staff conducted group or individual video education with verbal and written material and guidebook.  Patient learns how to incorporate Pritikin recommendations into their lifestyle. Recommendations include planning and keeping personal health goals in mind as an important part of their success.  Healthy Mind-Set    Healthy Minds, Bodies, Hearts  Clinical staff conducted group or individual video education with verbal and written material and guidebook.  Patient learns how to identify when they are stressed. Video will discuss the impact of that stress, as well as the many benefits of stress management. Patient will also be introduced to stress management techniques. The way we think, act, and feel has an impact on our  hearts.  How Our Thoughts Can Heal Our Hearts  Clinical staff conducted group or individual video education with verbal and written material and guidebook.  Patient learns that negative thoughts can cause depression and anxiety. This can result in negative lifestyle behavior and serious health problems. Cognitive behavioral therapy is an effective method to help control our thoughts in order to change and improve our emotional outlook.  Additional Videos:  Exercise    Improving Performance  Clinical staff conducted group or individual video education with verbal and written material and guidebook.  Patient learns to use a non-linear approach by alternating intensity levels and lengths of time spent exercising to help burn more calories and lose more body fat. Cardiovascular exercise helps improve heart health, metabolism, hormonal balance, blood sugar control, and recovery from fatigue. Resistance training improves strength, endurance, balance, coordination, reaction time, metabolism, and muscle mass. Flexibility exercise improves circulation, posture, and balance. Seek guidance from your physician and exercise physiologist before implementing an exercise routine and learn your capabilities and proper form for all exercise.  Introduction to Yoga  Clinical staff conducted group or individual video education with verbal and written material and guidebook.  Patient learns about yoga, a discipline of the coming together of mind, breath, and body. The benefits of yoga include improved flexibility, improved range of motion, better posture and core strength, increased lung function, weight loss, and positive self-image. Yoga's heart health benefits include lowered blood pressure, healthier heart rate, decreased cholesterol and triglyceride levels, improved immune function, and reduced stress. Seek guidance from your physician and exercise physiologist before implementing an exercise routine and learn your  capabilities and proper form for all exercise.  Medical   Aging: Enhancing Your Quality of Life  Clinical staff conducted group or individual video education with verbal and written material and guidebook.  Patient learns key strategies and recommendations to stay in good physical health and enhance quality of life, such as prevention strategies, having an advocate, securing a Health Care Proxy and Power of Attorney, and keeping a list of medications and system for tracking them. It also discusses how to avoid risk for bone loss.  Biology of Weight Control  Clinical staff conducted group or individual video education with verbal and written material and guidebook.  Patient learns that weight gain occurs because we consume more calories  than we burn (eating more, moving less). Even if your body weight is normal, you may have higher ratios of fat compared to muscle mass. Too much body fat puts you at increased risk for cardiovascular disease, heart attack, stroke, type 2 diabetes, and obesity-related cancers. In addition to exercise, following the Pritikin Eating Plan can help reduce your risk.  Decoding Lab Results  Clinical staff conducted group or individual video education with verbal and written material and guidebook.  Patient learns that lab test reflects one measurement whose values change over time and are influenced by many factors, including medication, stress, sleep, exercise, food, hydration, pre-existing medical conditions, and more. It is recommended to use the knowledge from this video to become more involved with your lab results and evaluate your numbers to speak with your doctor.   Diseases of Our Time - Overview  Clinical staff conducted group or individual video education with verbal and written material and guidebook.  Patient learns that according to the CDC, 50% to 70% of chronic diseases (such as obesity, type 2 diabetes, elevated lipids, hypertension, and heart disease) are  avoidable through lifestyle improvements including healthier food choices, listening to satiety cues, and increased physical activity.  Sleep Disorders Clinical staff conducted group or individual video education with verbal and written material and guidebook.  Patient learns how good quality and duration of sleep are important to overall health and well-being. Patient also learns about sleep disorders and how they impact health along with recommendations to address them, including discussing with a physician.  Nutrition  Dining Out - Part 2 Clinical staff conducted group or individual video education with verbal and written material and guidebook.  Patient learns how to plan ahead and communicate in order to maximize their dining experience in a healthy and nutritious manner. Included are recommended food choices based on the type of restaurant the patient is visiting.   Fueling a Banker conducted group or individual video education with verbal and written material and guidebook.  There is a strong connection between our food choices and our health. Diseases like obesity and type 2 diabetes are very prevalent and are in large-part due to lifestyle choices. The Pritikin Eating Plan provides plenty of food and hunger-curbing satisfaction. It is easy to follow, affordable, and helps reduce health risks.  Menu Workshop  Clinical staff conducted group or individual video education with verbal and written material and guidebook.  Patient learns that restaurant meals can sabotage health goals because they are often packed with calories, fat, sodium, and sugar. Recommendations include strategies to plan ahead and to communicate with the manager, chef, or server to help order a healthier meal.  Planning Your Eating Strategy  Clinical staff conducted group or individual video education with verbal and written material and guidebook.  Patient learns about the Pritikin Eating Plan and  its benefit of reducing the risk of disease. The Pritikin Eating Plan does not focus on calories. Instead, it emphasizes high-quality, nutrient-rich foods. By knowing the characteristics of the foods, we choose, we can determine their calorie density and make informed decisions.  Targeting Your Nutrition Priorities  Clinical staff conducted group or individual video education with verbal and written material and guidebook.  Patient learns that lifestyle habits have a tremendous impact on disease risk and progression. This video provides eating and physical activity recommendations based on your personal health goals, such as reducing LDL cholesterol, losing weight, preventing or controlling type 2 diabetes, and reducing high blood  pressure.  Vitamins and Minerals  Clinical staff conducted group or individual video education with verbal and written material and guidebook.  Patient learns different ways to obtain key vitamins and minerals, including through a recommended healthy diet. It is important to discuss all supplements you take with your doctor.   Healthy Mind-Set    Smoking Cessation  Clinical staff conducted group or individual video education with verbal and written material and guidebook.  Patient learns that cigarette smoking and tobacco addiction pose a serious health risk which affects millions of people. Stopping smoking will significantly reduce the risk of heart disease, lung disease, and many forms of cancer. Recommended strategies for quitting are covered, including working with your doctor to develop a successful plan.  Culinary   Becoming a Set designer conducted group or individual video education with verbal and written material and guidebook.  Patient learns that cooking at home can be healthy, cost-effective, quick, and puts them in control. Keys to cooking healthy recipes will include looking at your recipe, assessing your equipment needs, planning ahead,  making it simple, choosing cost-effective seasonal ingredients, and limiting the use of added fats, salts, and sugars.  Cooking - Breakfast and Snacks  Clinical staff conducted group or individual video education with verbal and written material and guidebook.  Patient learns how important breakfast is to satiety and nutrition through the entire day. Recommendations include key foods to eat during breakfast to help stabilize blood sugar levels and to prevent overeating at meals later in the day. Planning ahead is also a key component.  Cooking - Educational psychologist conducted group or individual video education with verbal and written material and guidebook.  Patient learns eating strategies to improve overall health, including an approach to cook more at home. Recommendations include thinking of animal protein as a side on your plate rather than center stage and focusing instead on lower calorie dense options like vegetables, fruits, whole grains, and plant-based proteins, such as beans. Making sauces in large quantities to freeze for later and leaving the skin on your vegetables are also recommended to maximize your experience.  Cooking - Healthy Salads and Dressing Clinical staff conducted group or individual video education with verbal and written material and guidebook.  Patient learns that vegetables, fruits, whole grains, and legumes are the foundations of the Pritikin Eating Plan. Recommendations include how to incorporate each of these in flavorful and healthy salads, and how to create homemade salad dressings. Proper handling of ingredients is also covered. Cooking - Soups and [employer] - Soups and Desserts Clinical staff conducted group or individual video education with verbal and written material and guidebook.  Patient learns that Pritikin soups and desserts make for easy, nutritious, and delicious snacks and meal components that are low in sodium, fat, sugar, and  calorie density, while high in vitamins, minerals, and filling fiber. Recommendations include simple and healthy ideas for soups and desserts.   Overview     The Pritikin Solution Program Overview Clinical staff conducted group or individual video education with verbal and written material and guidebook.  Patient learns that the results of the Pritikin Program have been documented in more than 100 articles published in peer-reviewed journals, and the benefits include reducing risk factors for (and, in some cases, even reversing) high cholesterol, high blood pressure, type 2 diabetes, obesity, and more! An overview of the three key pillars of the Pritikin Program will be covered: eating well, doing regular  exercise, and having a healthy mind-set.  WORKSHOPS  Exercise: Exercise Basics: Building Your Action Plan Clinical staff led group instruction and group discussion with PowerPoint presentation and patient guidebook. To enhance the learning environment the use of posters, models and videos may be added. At the conclusion of this workshop, patients will comprehend the difference between physical activity and exercise, as well as the benefits of incorporating both, into their routine. Patients will understand the FITT (Frequency, Intensity, Time, and Type) principle and how to use it to build an exercise action plan. In addition, safety concerns and other considerations for exercise and cardiac rehab will be addressed by the presenter. The purpose of this lesson is to promote a comprehensive and effective weekly exercise routine in order to improve patients' overall level of fitness.   Managing Heart Disease: Your Path to a Healthier Heart Clinical staff led group instruction and group discussion with PowerPoint presentation and patient guidebook. To enhance the learning environment the use of posters, models and videos may be added.At the conclusion of this workshop, patients will understand the  anatomy and physiology of the heart. Additionally, they will understand how Pritikin's three pillars impact the risk factors, the progression, and the management of heart disease.  The purpose of this lesson is to provide a high-level overview of the heart, heart disease, and how the Pritikin lifestyle positively impacts risk factors.  Exercise Biomechanics Clinical staff led group instruction and group discussion with PowerPoint presentation and patient guidebook. To enhance the learning environment the use of posters, models and videos may be added. Patients will learn how the structural parts of their bodies function and how these functions impact their daily activities, movement, and exercise. Patients will learn how to promote a neutral spine, learn how to manage pain, and identify ways to improve their physical movement in order to promote healthy living. The purpose of this lesson is to expose patients to common physical limitations that impact physical activity. Participants will learn practical ways to adapt and manage aches and pains, and to minimize their effect on regular exercise. Patients will learn how to maintain good posture while sitting, walking, and lifting.  Balance Training and Fall Prevention  Clinical staff led group instruction and group discussion with PowerPoint presentation and patient guidebook. To enhance the learning environment the use of posters, models and videos may be added. At the conclusion of this workshop, patients will understand the importance of their sensorimotor skills (vision, proprioception, and the vestibular system) in maintaining their ability to balance as they age. Patients will apply a variety of balancing exercises that are appropriate for their current level of function. Patients will understand the common causes for poor balance, possible solutions to these problems, and ways to modify their physical environment in order to minimize their  fall risk. The purpose of this lesson is to teach patients about the importance of maintaining balance as they age and ways to minimize their risk of falling.  WORKSHOPS   Nutrition:  Fueling a Ship broker led group instruction and group discussion with PowerPoint presentation and patient guidebook. To enhance the learning environment the use of posters, models and videos may be added. Patients will review the foundational principles of the Pritikin Eating Plan and understand what constitutes a serving size in each of the food groups. Patients will also learn Pritikin-friendly foods that are better choices when away from home and review make-ahead meal and snack options. Calorie density will be reviewed and applied to  three nutrition priorities: weight maintenance, weight loss, and weight gain. The purpose of this lesson is to reinforce (in a group setting) the key concepts around what patients are recommended to eat and how to apply these guidelines when away from home by planning and selecting Pritikin-friendly options. Patients will understand how calorie density may be adjusted for different weight management goals.  Mindful Eating  Clinical staff led group instruction and group discussion with PowerPoint presentation and patient guidebook. To enhance the learning environment the use of posters, models and videos may be added. Patients will briefly review the concepts of the Pritikin Eating Plan and the importance of low-calorie dense foods. The concept of mindful eating will be introduced as well as the importance of paying attention to internal hunger signals. Triggers for non-hunger eating and techniques for dealing with triggers will be explored. The purpose of this lesson is to provide patients with the opportunity to review the basic principles of the Pritikin Eating Plan, discuss the value of eating mindfully and how to measure internal cues of hunger and fullness using the  Hunger Scale. Patients will also discuss reasons for non-hunger eating and learn strategies to use for controlling emotional eating.  Targeting Your Nutrition Priorities Clinical staff led group instruction and group discussion with PowerPoint presentation and patient guidebook. To enhance the learning environment the use of posters, models and videos may be added. Patients will learn how to determine their genetic susceptibility to disease by reviewing their family history. Patients will gain insight into the importance of diet as part of an overall healthy lifestyle in mitigating the impact of genetics and other environmental insults. The purpose of this lesson is to provide patients with the opportunity to assess their personal nutrition priorities by looking at their family history, their own health history and current risk factors. Patients will also be able to discuss ways of prioritizing and modifying the Pritikin Eating Plan for their highest risk areas  Menu  Clinical staff led group instruction and group discussion with PowerPoint presentation and patient guidebook. To enhance the learning environment the use of posters, models and videos may be added. Using menus brought in from [employer], or printed from [employer], patients will apply the Pritikin dining out guidelines that were presented in the [employer] video. Patients will also be able to practice these guidelines in a variety of provided scenarios. The purpose of this lesson is to provide patients with the opportunity to practice hands-on learning of the Pritikin Dining Out guidelines with actual menus and practice scenarios.  Label Reading Clinical staff led group instruction and group discussion with PowerPoint presentation and patient guidebook. To enhance the learning environment the use of posters, models and videos may be added. Patients will review and discuss the Pritikin label reading guidelines  presented in Pritikin's Label Reading Educational series video. Using fool labels brought in from local grocery stores and markets, patients will apply the label reading guidelines and determine if the packaged food meet the Pritikin guidelines. The purpose of this lesson is to provide patients with the opportunity to review, discuss, and practice hands-on learning of the Pritikin Label Reading guidelines with actual packaged food labels. Cooking School  Pritikin's [employer] are designed to teach patients ways to prepare quick, simple, and affordable recipes at home. The importance of nutrition's role in chronic disease risk reduction is reflected in its emphasis in the overall Pritikin program. By learning how to prepare essential core Pritikin Eating  Plan recipes, patients will increase control over what they eat; be able to customize the flavor of foods without the use of added salt, sugar, or fat; and improve the quality of the food they consume. By learning a set of core recipes which are easily assembled, quickly prepared, and affordable, patients are more likely to prepare more healthy foods at home. These workshops focus on convenient breakfasts, simple entres, side dishes, and desserts which can be prepared with minimal effort and are consistent with nutrition recommendations for cardiovascular risk reduction. Cooking [employer] are taught by a Armed forces logistics/support/administrative officer (RD) who has been trained by the [employer]. The chef or RD has a clear understanding of the importance of minimizing - if not completely eliminating - added fat, sugar, and sodium in recipes. Throughout the series of Cooking School Workshop sessions, patients will learn about healthy ingredients and efficient methods of cooking to build confidence in their capability to prepare    Cooking School weekly topics:  Adding Flavor- Sodium-Free  Fast and Healthy Breakfasts  Powerhouse Plant-Based  Proteins  Satisfying Salads and Dressings  Simple Sides and Sauces  International Cuisine-Spotlight on the [employer] Zones  Delicious Desserts  Savory Soups  [employer] - Meals in a Astronomer Appetizers and Snacks  Comforting Weekend Breakfasts  One-Pot Wonders   Fast Evening Meals  Landscape architect Your Pritikin Plate  WORKSHOPS   Healthy Mindset (Psychosocial):  Focused Goals, Sustainable Changes Clinical staff led group instruction and group discussion with PowerPoint presentation and patient guidebook. To enhance the learning environment the use of posters, models and videos may be added. Patients will be able to apply effective goal setting strategies to establish at least one personal goal, and then take consistent, meaningful action toward that goal. They will learn to identify common barriers to achieving personal goals and develop strategies to overcome them. Patients will also gain an understanding of how our mind-set can impact our ability to achieve goals and the importance of cultivating a positive and growth-oriented mind-set. The purpose of this lesson is to provide patients with a deeper understanding of how to set and achieve personal goals, as well as the tools and strategies needed to overcome common obstacles which may arise along the way.  From Head to Heart: The Power of a Healthy Outlook  Clinical staff led group instruction and group discussion with PowerPoint presentation and patient guidebook. To enhance the learning environment the use of posters, models and videos may be added. Patients will be able to recognize and describe the impact of emotions and mood on physical health. They will discover the importance of self-care and explore self-care practices which may work for them. Patients will also learn how to utilize the 4 C's to cultivate a healthier outlook and better manage stress and challenges. The purpose of this lesson is to demonstrate  to patients how a healthy outlook is an essential part of maintaining good health, especially as they continue their cardiac rehab journey.  Healthy Sleep for a Healthy Heart Clinical staff led group instruction and group discussion with PowerPoint presentation and patient guidebook. To enhance the learning environment the use of posters, models and videos may be added. At the conclusion of this workshop, patients will be able to demonstrate knowledge of the importance of sleep to overall health, well-being, and quality of life. They will understand the symptoms of, and treatments for, common sleep disorders. Patients will also be able to identify  daytime and nighttime behaviors which impact sleep, and they will be able to apply these tools to help manage sleep-related challenges. The purpose of this lesson is to provide patients with a general overview of sleep and outline the importance of quality sleep. Patients will learn about a few of the most common sleep disorders. Patients will also be introduced to the concept of "sleep hygiene," and discover ways to self-manage certain sleeping problems through simple daily behavior changes. Finally, the workshop will motivate patients by clarifying the links between quality sleep and their goals of heart-healthy living.   Recognizing and Reducing Stress Clinical staff led group instruction and group discussion with PowerPoint presentation and patient guidebook. To enhance the learning environment the use of posters, models and videos may be added. At the conclusion of this workshop, patients will be able to understand the types of stress reactions, differentiate between acute and chronic stress, and recognize the impact that chronic stress has on their health. They will also be able to apply different coping mechanisms, such as reframing negative self-talk. Patients will have the opportunity to practice a variety of stress management techniques, such as deep  abdominal breathing, progressive muscle relaxation, and/or guided imagery.  The purpose of this lesson is to educate patients on the role of stress in their lives and to provide healthy techniques for coping with it.  Learning Barriers/Preferences:  Learning Barriers/Preferences - 06/23/23 1523       Learning Barriers/Preferences   Learning Barriers None    Learning Preferences Computer/Internet;Written Material             Education Topics:  Knowledge Questionnaire Score:  Knowledge Questionnaire Score - 06/23/23 1512       Knowledge Questionnaire Score   Pre Score 22/24             Core Components/Risk Factors/Patient Goals at Admission:  Personal Goals and Risk Factors at Admission - 06/23/23 1607       Core Components/Risk Factors/Patient Goals on Admission   Hypertension Yes    Intervention Provide education on lifestyle modifcations including regular physical activity/exercise, weight management, moderate sodium restriction and increased consumption of fresh fruit, vegetables, and low fat dairy, alcohol moderation, and smoking cessation.;Monitor prescription use compliance.    Expected Outcomes Short Term: Continued assessment and intervention until BP is < 140/54mm HG in hypertensive participants. < 130/43mm HG in hypertensive participants with diabetes, heart failure or chronic kidney disease.;Long Term: Maintenance of blood pressure at goal levels.    Lipids Yes    Intervention Provide education and support for participant on nutrition & aerobic/resistive exercise along with prescribed medications to achieve LDL 70mg , HDL >40mg .    Expected Outcomes Short Term: Participant states understanding of desired cholesterol values and is compliant with medications prescribed. Participant is following exercise prescription and nutrition guidelines.;Long Term: Cholesterol controlled with medications as prescribed, with individualized exercise RX and with personalized  nutrition plan. Value goals: LDL < 70mg , HDL > 40 mg.    Stress Yes    Intervention Offer individual and/or small group education and counseling on adjustment to heart disease, stress management and health-related lifestyle change. Teach and support self-help strategies.;Refer participants experiencing significant psychosocial distress to appropriate mental health specialists for further evaluation and treatment. When possible, include family members and significant others in education/counseling sessions.    Expected Outcomes Short Term: Participant demonstrates changes in health-related behavior, relaxation and other stress management skills, ability to obtain effective social support, and compliance with psychotropic medications if  prescribed.;Long Term: Emotional wellbeing is indicated by absence of clinically significant psychosocial distress or social isolation.    Personal Goal Other Yes    Personal Goal Short term: knowledge on heart health, Ex routine that she can stick with Long term: Balance, flexibility and stamina    Intervention Will continue to monitor pt and progress workloads as tolerated without sign or symptom    Expected Outcomes Pt will achieve her goals and gain strength             Core Components/Risk Factors/Patient Goals Review:   Goals and Risk Factor Review     Row Name 06/30/23 1039 07/05/23 0931 07/26/23 0810 08/23/23 1542       Core Components/Risk Factors/Patient Goals Review   Personal Goals Review Weight Management/Obesity;Lipids;Stress;Hypertension Weight Management/Obesity;Lipids;Stress;Hypertension Weight Management/Obesity;Lipids;Stress;Hypertension Weight Management/Obesity;Lipids;Stress;Hypertension    Review Tiyona started cardiac rehab on 06/29/23. Araya did well with exercise. Vital signs were stable. Avanna started cardiac rehab on 06/29/23. Janisa is off to a good start  with exercise. Vital signs have been stable. Chanteria is doing well  with  exercise at cardiac rehab. Vital signs have been stable. Safina has been increasing her met level Norvell continues to do well  with exercise at cardiac rehab. Vital signs have been stable. Junior has been increasing her met level    Expected Outcomes Avalynn will continue to participate in cardiac rehab for exercise, nutrtion and lifestyle modifications Giannamarie will continue to participate in cardiac rehab for exercise, nutrtion and lifestyle modifications Eastyn will continue to participate in cardiac rehab for exercise, nutrtion and lifestyle modifications Duha will continue to participate in cardiac rehab for exercise, nutrtion and lifestyle modifications             Core Components/Risk Factors/Patient Goals at Discharge (Final Review):   Goals and Risk Factor Review - 08/23/23 1542       Core Components/Risk Factors/Patient Goals Review   Personal Goals Review Weight Management/Obesity;Lipids;Stress;Hypertension    Review Jakyrah continues to do well  with exercise at cardiac rehab. Vital signs have been stable. Justice has been increasing her met level    Expected Outcomes Diondra will continue to participate in cardiac rehab for exercise, nutrtion and lifestyle modifications             ITP Comments:  ITP Comments     Row Name 06/23/23 1017 06/30/23 0956 07/05/23 0928 07/26/23 0806 08/23/23 1540   ITP Comments Gaylyn Keas, MD: Medical Director.  Introduction to the [facility] /  Intensive Cardiac Re[facility].  Initial orientation packet reviewed with the patient. 30 Day ITP Review. Shamara started cardiac rehab on 06/29/23. Sejal did well with exercise. 30 Day ITP Review. Destinie started cardiac rehab on 06/29/23. Shawni is off to a good start with exercise. 30 Day ITP Review.  Tnia has good attendance and participation  with exercise at cardiac rehab 30 Day ITP Review.  Marlenne continues to have good attendance and participation  with exercise at cardiac rehab             Comments: See ITP comments.Monte Antonio RN BSN

## 2023-08-24 ENCOUNTER — Encounter (HOSPITAL_COMMUNITY)
Admission: RE | Admit: 2023-08-24 | Discharge: 2023-08-24 | Disposition: A | Discharge status: Home / Self Care | Source: Ambulatory Visit | Attending: Internal Medicine | Admitting: Internal Medicine

## 2023-08-24 DIAGNOSIS — I213 ST elevation (STEMI) myocardial infarction of unspecified site: Secondary | ICD-10-CM | POA: Diagnosis not present

## 2023-08-26 ENCOUNTER — Encounter (HOSPITAL_COMMUNITY)
Admission: RE | Admit: 2023-08-26 | Discharge: 2023-08-26 | Disposition: A | Discharge status: Home / Self Care | Source: Ambulatory Visit | Attending: Internal Medicine | Admitting: Internal Medicine

## 2023-08-26 DIAGNOSIS — I213 ST elevation (STEMI) myocardial infarction of unspecified site: Secondary | ICD-10-CM

## 2023-08-31 ENCOUNTER — Encounter (HOSPITAL_COMMUNITY)
Admission: RE | Admit: 2023-08-31 | Discharge: 2023-08-31 | Disposition: A | Discharge status: Home / Self Care | Source: Ambulatory Visit | Attending: Internal Medicine

## 2023-08-31 DIAGNOSIS — I213 ST elevation (STEMI) myocardial infarction of unspecified site: Secondary | ICD-10-CM | POA: Diagnosis not present

## 2023-09-02 ENCOUNTER — Encounter (HOSPITAL_COMMUNITY)
Admission: RE | Admit: 2023-09-02 | Discharge: 2023-09-02 | Disposition: A | Discharge status: Home / Self Care | Source: Ambulatory Visit | Attending: Internal Medicine | Admitting: Internal Medicine

## 2023-09-02 DIAGNOSIS — I213 ST elevation (STEMI) myocardial infarction of unspecified site: Secondary | ICD-10-CM

## 2023-09-05 ENCOUNTER — Encounter (HOSPITAL_COMMUNITY)
Admission: RE | Admit: 2023-09-05 | Discharge: 2023-09-05 | Disposition: A | Discharge status: Home / Self Care | Source: Ambulatory Visit | Attending: Internal Medicine | Admitting: Internal Medicine

## 2023-09-05 ENCOUNTER — Telehealth (HOSPITAL_COMMUNITY): Payer: Self-pay

## 2023-09-05 DIAGNOSIS — I252 Old myocardial infarction: Secondary | ICD-10-CM | POA: Insufficient documentation

## 2023-09-05 DIAGNOSIS — I119 Hypertensive heart disease without heart failure: Secondary | ICD-10-CM | POA: Diagnosis not present

## 2023-09-05 DIAGNOSIS — I10 Essential (primary) hypertension: Secondary | ICD-10-CM | POA: Diagnosis present

## 2023-09-05 DIAGNOSIS — I213 ST elevation (STEMI) myocardial infarction of unspecified site: Secondary | ICD-10-CM

## 2023-09-05 DIAGNOSIS — Z79899 Other long term (current) drug therapy: Secondary | ICD-10-CM | POA: Insufficient documentation

## 2023-09-05 DIAGNOSIS — Z48812 Encounter for surgical aftercare following surgery on the circulatory system: Secondary | ICD-10-CM | POA: Insufficient documentation

## 2023-09-05 DIAGNOSIS — I249 Acute ischemic heart disease, unspecified: Secondary | ICD-10-CM | POA: Diagnosis not present

## 2023-09-05 DIAGNOSIS — Z6829 Body mass index (BMI) 29.0-29.9, adult: Secondary | ICD-10-CM | POA: Insufficient documentation

## 2023-09-05 DIAGNOSIS — E785 Hyperlipidemia, unspecified: Secondary | ICD-10-CM | POA: Insufficient documentation

## 2023-09-05 DIAGNOSIS — I5189 Other ill-defined heart diseases: Secondary | ICD-10-CM | POA: Diagnosis present

## 2023-09-07 ENCOUNTER — Encounter (HOSPITAL_COMMUNITY)
Admission: RE | Admit: 2023-09-07 | Discharge: 2023-09-07 | Disposition: A | Discharge status: Home / Self Care | Source: Ambulatory Visit | Attending: Internal Medicine

## 2023-09-07 VITALS — Ht 61.0 in | Wt 155.9 lb

## 2023-09-07 DIAGNOSIS — Z48812 Encounter for surgical aftercare following surgery on the circulatory system: Secondary | ICD-10-CM | POA: Diagnosis not present

## 2023-09-07 DIAGNOSIS — I213 ST elevation (STEMI) myocardial infarction of unspecified site: Secondary | ICD-10-CM

## 2023-09-07 DIAGNOSIS — E785 Hyperlipidemia, unspecified: Secondary | ICD-10-CM | POA: Diagnosis not present

## 2023-09-07 DIAGNOSIS — I249 Acute ischemic heart disease, unspecified: Secondary | ICD-10-CM | POA: Diagnosis not present

## 2023-09-07 DIAGNOSIS — Z6829 Body mass index (BMI) 29.0-29.9, adult: Secondary | ICD-10-CM | POA: Diagnosis not present

## 2023-09-07 DIAGNOSIS — I119 Hypertensive heart disease without heart failure: Secondary | ICD-10-CM | POA: Diagnosis not present

## 2023-09-07 DIAGNOSIS — I252 Old myocardial infarction: Secondary | ICD-10-CM | POA: Diagnosis not present

## 2023-09-09 ENCOUNTER — Encounter (HOSPITAL_COMMUNITY)

## 2023-09-09 ENCOUNTER — Telehealth (HOSPITAL_COMMUNITY): Payer: Self-pay

## 2023-09-09 NOTE — Telephone Encounter (Signed)
 Patient c/o sick for 11:45am class, states she is not feeling well. Confirmed she does not have flu or COVID symptoms, she states she has a moderately upset stomach.

## 2023-09-12 ENCOUNTER — Encounter (HOSPITAL_COMMUNITY)
Admission: RE | Admit: 2023-09-12 | Discharge: 2023-09-12 | Disposition: A | Discharge status: Home / Self Care | Source: Ambulatory Visit | Attending: Internal Medicine

## 2023-09-12 DIAGNOSIS — Z6829 Body mass index (BMI) 29.0-29.9, adult: Secondary | ICD-10-CM | POA: Diagnosis not present

## 2023-09-12 DIAGNOSIS — Z48812 Encounter for surgical aftercare following surgery on the circulatory system: Secondary | ICD-10-CM | POA: Diagnosis not present

## 2023-09-12 DIAGNOSIS — I252 Old myocardial infarction: Secondary | ICD-10-CM | POA: Diagnosis not present

## 2023-09-12 DIAGNOSIS — I119 Hypertensive heart disease without heart failure: Secondary | ICD-10-CM | POA: Diagnosis not present

## 2023-09-12 DIAGNOSIS — E785 Hyperlipidemia, unspecified: Secondary | ICD-10-CM | POA: Diagnosis not present

## 2023-09-12 DIAGNOSIS — I213 ST elevation (STEMI) myocardial infarction of unspecified site: Secondary | ICD-10-CM

## 2023-09-12 DIAGNOSIS — I249 Acute ischemic heart disease, unspecified: Secondary | ICD-10-CM | POA: Diagnosis not present

## 2023-09-13 DIAGNOSIS — K529 Noninfective gastroenteritis and colitis, unspecified: Secondary | ICD-10-CM | POA: Diagnosis not present

## 2023-09-14 ENCOUNTER — Encounter (HOSPITAL_COMMUNITY)
Admission: RE | Admit: 2023-09-14 | Discharge: 2023-09-14 | Disposition: A | Discharge status: Home / Self Care | Source: Ambulatory Visit | Attending: Internal Medicine | Admitting: Internal Medicine

## 2023-09-14 DIAGNOSIS — E785 Hyperlipidemia, unspecified: Secondary | ICD-10-CM | POA: Diagnosis not present

## 2023-09-14 DIAGNOSIS — I249 Acute ischemic heart disease, unspecified: Secondary | ICD-10-CM | POA: Diagnosis not present

## 2023-09-14 DIAGNOSIS — Z6829 Body mass index (BMI) 29.0-29.9, adult: Secondary | ICD-10-CM | POA: Diagnosis not present

## 2023-09-14 DIAGNOSIS — I213 ST elevation (STEMI) myocardial infarction of unspecified site: Secondary | ICD-10-CM

## 2023-09-14 DIAGNOSIS — I252 Old myocardial infarction: Secondary | ICD-10-CM | POA: Diagnosis not present

## 2023-09-14 DIAGNOSIS — I119 Hypertensive heart disease without heart failure: Secondary | ICD-10-CM | POA: Diagnosis not present

## 2023-09-14 DIAGNOSIS — Z48812 Encounter for surgical aftercare following surgery on the circulatory system: Secondary | ICD-10-CM | POA: Diagnosis not present

## 2023-09-16 ENCOUNTER — Encounter (HOSPITAL_COMMUNITY)
Admission: RE | Admit: 2023-09-16 | Discharge: 2023-09-16 | Disposition: A | Discharge status: Home / Self Care | Source: Ambulatory Visit | Attending: Internal Medicine | Admitting: Internal Medicine

## 2023-09-16 DIAGNOSIS — I213 ST elevation (STEMI) myocardial infarction of unspecified site: Secondary | ICD-10-CM

## 2023-09-16 DIAGNOSIS — E785 Hyperlipidemia, unspecified: Secondary | ICD-10-CM | POA: Diagnosis not present

## 2023-09-16 DIAGNOSIS — I249 Acute ischemic heart disease, unspecified: Secondary | ICD-10-CM | POA: Diagnosis not present

## 2023-09-16 DIAGNOSIS — Z6829 Body mass index (BMI) 29.0-29.9, adult: Secondary | ICD-10-CM | POA: Diagnosis not present

## 2023-09-16 DIAGNOSIS — Z48812 Encounter for surgical aftercare following surgery on the circulatory system: Secondary | ICD-10-CM | POA: Diagnosis not present

## 2023-09-16 DIAGNOSIS — I252 Old myocardial infarction: Secondary | ICD-10-CM | POA: Diagnosis not present

## 2023-09-16 DIAGNOSIS — I119 Hypertensive heart disease without heart failure: Secondary | ICD-10-CM | POA: Diagnosis not present

## 2023-09-19 ENCOUNTER — Encounter (HOSPITAL_COMMUNITY)
Admission: RE | Admit: 2023-09-19 | Discharge: 2023-09-19 | Disposition: A | Discharge status: Home / Self Care | Source: Ambulatory Visit | Attending: Internal Medicine | Admitting: Internal Medicine

## 2023-09-19 DIAGNOSIS — E785 Hyperlipidemia, unspecified: Secondary | ICD-10-CM | POA: Diagnosis not present

## 2023-09-19 DIAGNOSIS — Z48812 Encounter for surgical aftercare following surgery on the circulatory system: Secondary | ICD-10-CM | POA: Diagnosis not present

## 2023-09-19 DIAGNOSIS — I213 ST elevation (STEMI) myocardial infarction of unspecified site: Secondary | ICD-10-CM

## 2023-09-19 DIAGNOSIS — Z6829 Body mass index (BMI) 29.0-29.9, adult: Secondary | ICD-10-CM | POA: Diagnosis not present

## 2023-09-19 DIAGNOSIS — I119 Hypertensive heart disease without heart failure: Secondary | ICD-10-CM | POA: Diagnosis not present

## 2023-09-19 DIAGNOSIS — I249 Acute ischemic heart disease, unspecified: Secondary | ICD-10-CM | POA: Diagnosis not present

## 2023-09-19 DIAGNOSIS — I252 Old myocardial infarction: Secondary | ICD-10-CM | POA: Diagnosis not present

## 2023-09-20 NOTE — Progress Notes (Signed)
 Cardiac Individual Treatment Plan  Patient Details  Name: [name] MRN: [id number] Date of Birth: 12-04-1960 Referring Provider:   Flowsheet Row INTENSIVE CARDIAC REHAB ORIENT from 06/23/2023 in [facility] for Heart, Vascular, & Lung Health  Referring Provider Dr. Alyssa Backbone, MD    Initial Encounter Date:  Flowsheet Row INTENSIVE CARDIAC REHAB ORIENT from 06/23/2023 in [facility] for Heart, Vascular, & Lung Health  Date 06/23/23    Visit Diagnosis: 05/31/23 ST elevation myocardial infarction (STEMI)  Patient's Home Medications on Admission:  Current Outpatient Medications:    albuterol  (PROVENTIL ) (2.5 MG/3ML) 0.083% nebulizer solution, Take 3 mLs (2.5 mg total) by nebulization every 6 (six) hours as needed for wheezing or shortness of breath., Disp: 75 mL, Rfl: 12   albuterol  (VENTOLIN  HFA) 108 (90 Base) MCG/ACT inhaler, USE 1 TO 2 INHALATIONS EVERY 6 HOURS AS NEEDED FOR SHORTNESS OF BREATH, Disp: 25.5 g, Rfl: 3   ALPRAZolam  (XANAX ) 0.5 MG tablet, Take 0.5 mg by mouth at bedtime., Disp: , Rfl:    aspirin  81 MG chewable tablet, Chew 1 tablet (81 mg total) by mouth daily., Disp: 90 tablet, Rfl: 3   Cholecalciferol (VITAMIN D -3 PO), Take 1 tablet by mouth in the morning., Disp: , Rfl:    clopidogrel  (PLAVIX ) 75 MG tablet, Take 1 tablet (75 mg total) by mouth daily with breakfast., Disp: 90 tablet, Rfl: 3   FLUoxetine  (PROZAC ) 20 MG capsule, Take 20 mg by mouth at bedtime., Disp: , Rfl:    fluticasone -salmeterol (WIXELA INHUB) 100-50 MCG/ACT AEPB, Inhale 1 puff then rinse mouth, once daily (Patient taking differently: Inhale 1 puff into the lungs 2 (two) times daily.), Disp: 180 each, Rfl: 4   MAGNESIUM  PO, Take 1 tablet by mouth in the morning., Disp: , Rfl:    metoprolol  succinate (TOPROL  XL) 25 MG 24 hr tablet, Take 1 tablet (25 mg total) by mouth daily., Disp: 90 tablet, Rfl: 3   nitroGLYCERIN  (NITROSTAT ) 0.4 MG SL tablet,  Place 1 tablet (0.4 mg total) under the tongue every 5 (five) minutes as needed. (Patient not taking: Reported on 06/27/2023), Disp: 25 tablet, Rfl: 3   OLANZapine -FLUoxetine  (SYMBYAX ) 3-25 MG capsule, Take 1 capsule by mouth at bedtime., Disp: , Rfl:    PSYLLIUM HUSK PO, Take 2 capsules by mouth in the morning and at bedtime., Disp: , Rfl:    rosuvastatin  (CRESTOR ) 40 MG tablet, Take 1 tablet (40 mg total) by mouth daily., Disp: 90 tablet, Rfl: 3   WEGOVY  2.4 MG/0.75ML SOAJ, Inject 2.4 mg into the skin once a week., Disp: 3 mL, Rfl: 5  Past Medical History: Past Medical History:  Diagnosis Date   Anxiety    psychiatrist: Dr. Gavin Kast    Arthritis    Asthma    Bronchitis    Colitis    Colon polyps    COVID-19 09/2020   Depression    psychiatrist: Dr. Gavin Kast   Elevated liver enzymes    Dr. Angelo Kennedy following   Essential hypertension, benign 03/05/2015   Fatty liver    GERD (gastroesophageal reflux disease)    Heart murmur    History of chicken pox    History of kidney stones    Hypertension    Multiple pulmonary nodules determined by computed tomography of lung 03/07/2014   Last CT suggested clearance of tree-in-bud.  Follows with Dr. Rupert Counts     Chest CT [facility] 2015-micronodules, groundglass, tree in bud right upper lobe  CT chest  03/15/16 No evidence of interstitial lung disease or mycobacterium aviumcomplex. Scattered pulmonary nodules. No f/u needed- low risk   Osteoarthritis of right knee 07/29/2021   Postmenopausal bleeding 06/27/2020   Primary osteoarthritis of both knees 04/09/2011   Formatting of this note might be different from the original. Overview:  S/p left total knee replacement   Renal cell carcinoma    hx of left and right; Dr. Madelin Schatz   Trochanteric bursitis, left hip 09/27/2018    Tobacco Use: Social History   Tobacco Use  Smoking Status Never   Passive exposure: Never  Smokeless Tobacco Never    Labs: Review Flowsheet  More data exists      Latest Ref  Rng & Units 06/05/2021 05/07/2022 10/22/2022 05/31/2023 07/28/2023  Labs for ITP Cardiac and Pulmonary Rehab  Cholestrol 100 - 199 mg/dL 161  096  - 045  409   LDL (calc) 0 - 99 mg/dL 811  914  - 782  49   HDL-C >39 mg/dL 95.62  13.08  - 45  46   Trlycerides 0 - 149 mg/dL 657.8  469.6  - 51  70   Hemoglobin A1c 4.8 - 5.6 % 5.7  6.3  5.2  5.2  5.2  5.2  5.1  -  TCO2 22 - 32 mmol/L - - - 22  -    Details       Multiple values from one day are sorted in reverse-chronological order         Capillary Blood Glucose: No results found for: GLUCAP   Exercise Target Goals: Exercise Program Goal: Individual exercise prescription set using results from initial 6 min walk test and THRR while considering  patient's activity barriers and safety.   Exercise Prescription Goal: Initial exercise prescription builds to 30-45 minutes a day of aerobic activity, 2-3 days per week.  Home exercise guidelines will be given to patient during program as part of exercise prescription that the participant will acknowledge.  Activity Barriers & Risk Stratification:  Activity Barriers & Cardiac Risk Stratification - 06/23/23 1518       Activity Barriers & Cardiac Risk Stratification   Activity Barriers Left Knee Replacement;Right Knee Replacement;Joint Problems;Balance Concerns;History of Falls;Back Problems;Neck/Spine Problems;Deconditioning    Cardiac Risk Stratification High          6 Minute Walk:  6 Minute Walk     Row Name 06/23/23 1106 09/07/23 1253       6 Minute Walk   Phase Initial Discharge    Distance 1200 feet 1636 feet    Distance % Change -- 36.3 %    Distance Feet Change -- 436 ft    Walk Time 0 minutes 6 minutes    # of Rest Breaks 0 0    MPH 2.3 3.1    METS 2.73 3.65    RPE 11 10    Perceived Dyspnea  0 0    VO2 Peak 9.6 12.8    Symptoms No No    Resting HR 62 bpm 60 bpm    Resting BP 124/80 100/70    Resting Oxygen Saturation  97 % --    Exercise Oxygen Saturation  during  6 min walk 99 % --    Max Ex. HR 81 bpm 99 bpm    Max Ex. BP 128/84 124/50    2 Minute Post BP 120/84 96/60       Oxygen Initial Assessment:   Oxygen Re-Evaluation:   Oxygen Discharge (Final Oxygen Re-Evaluation):  Initial Exercise Prescription:  Initial Exercise Prescription - 06/23/23 1500       Date of Initial Exercise RX and Referring Provider   Date 06/23/23    Referring Provider Dr. Alyssa Backbone, MD    Expected Discharge Date 09/14/23      Recumbant Bike   Level 1    RPM 60    Watts 15    Minutes 15    METs 2      NuStep   Level 1    SPM 80    Minutes 15    METs 1.7      Prescription Details   Frequency (times per week) 3    Duration Progress to 30 minutes of continuous aerobic without signs/symptoms of physical distress      Intensity   THRR 40-80% of Max Heartrate 63-126    Ratings of Perceived Exertion 11-13    Perceived Dyspnea 0-4      Progression   Progression Continue progressive overload as per policy without signs/symptoms or physical distress.      Resistance Training   Training Prescription Yes    Weight 2 lbs wts    Reps 10-15          Perform Capillary Blood Glucose checks as needed.  Exercise Prescription Changes:   Exercise Prescription Changes     Row Name 06/29/23 1300 06/29/23 1400 07/22/23 1600 07/27/23 1600 08/05/23 1600     Response to Exercise   Blood Pressure (Admit) -- 122/78 124/70 116/72 98/66   Blood Pressure (Exercise) -- 134/70 120/76 120/62 --   Blood Pressure (Exit) -- 124/68 102/68 114/72 106/56   Heart Rate (Admit) -- 58 bpm 60 bpm 66 bpm 64 bpm   Heart Rate (Exercise) -- 102 bpm 100 bpm 103 bpm 100 bpm   Heart Rate (Exit) -- 68 bpm 69 bpm 74 bpm 74 bpm   Rating of Perceived Exertion (Exercise) -- 8 12 12 12    Symptoms -- None None None None   Comments -- Pt's first day in the CRP2 program Reviewed METs and goals Reviewed Home exercise Rx Reviewed METs   Duration -- Continue with 30 min of aerobic  exercise without signs/symptoms of physical distress. Continue with 30 min of aerobic exercise without signs/symptoms of physical distress. Continue with 30 min of aerobic exercise without signs/symptoms of physical distress. Continue with 30 min of aerobic exercise without signs/symptoms of physical distress.   Intensity -- THRR unchanged THRR unchanged THRR unchanged THRR unchanged     Progression   Progression -- Continue to progress workloads to maintain intensity without signs/symptoms of physical distress. Continue to progress workloads to maintain intensity without signs/symptoms of physical distress. Continue to progress workloads to maintain intensity without signs/symptoms of physical distress. Continue to progress workloads to maintain intensity without signs/symptoms of physical distress.   Average METs -- 2.1 3.37 3.3 3.35     Resistance Training   Training Prescription -- No Yes No Yes   Weight -- No weights on wednesdays 3 lbs No weights on Wednesdays 3 lbs   Reps -- -- 10-15 -- 10-15   Time -- -- 10 Minutes -- 5 Minutes     Interval Training   Interval Training -- No No No No     Recumbant Bike   Level -- 1 4 4 5    RPM -- 54 47 39 44   Watts -- 12 48 40 10   Minutes -- 15 15 15 15    METs --  1.9 4.04 3.6 3.8     NuStep   Level -- 1 4 4 4    SPM -- 81 74 93 100   Minutes -- 15 15 15 15    METs -- 2.2 2.7 3 2.9     Home Exercise Plan   Plans to continue exercise at -- -- -- Home (comment) Home (comment)   Frequency -- -- -- Add 2 additional days to program exercise sessions. Add 2 additional days to program exercise sessions.   Initial Home Exercises Provided -- -- -- 07/27/23 07/27/23    Row Name 08/19/23 1400 09/14/23 1600           Response to Exercise   Blood Pressure (Admit) 100/70 106/62      Blood Pressure (Exit) 102/64 102/60      Heart Rate (Admit) 62 bpm 70 bpm      Heart Rate (Exercise) 104 bpm 99 bpm      Heart Rate (Exit) 71 bpm 79 bpm      Rating  of Perceived Exertion (Exercise) 12 12      Symptoms None None      Comments Reviewed METs/Goals Reviewed METs      Duration Continue with 30 min of aerobic exercise without signs/symptoms of physical distress. Continue with 30 min of aerobic exercise without signs/symptoms of physical distress.      Intensity THRR unchanged THRR unchanged        Progression   Progression Continue to progress workloads to maintain intensity without signs/symptoms of physical distress. Continue to progress workloads to maintain intensity without signs/symptoms of physical distress.      Average METs 3.7 3        Resistance Training   Training Prescription Yes No      Weight 4 lbs No weights on Wednesdays      Reps 10-15 --      Time 5 Minutes --        Interval Training   Interval Training No No        Recumbant Bike   Level 5 5      RPM 51 55      Watts 52 33      Minutes 15 15      METs 42 3.2        NuStep   Level 5 5      SPM 87 89      Minutes 15 15      METs 3.2 2.4        Home Exercise Plan   Plans to continue exercise at -- Home (comment)      Frequency -- Add 2 additional days to program exercise sessions.      Initial Home Exercises Provided -- 07/27/23         Exercise Comments:   Exercise Comments     Row Name 06/29/23 1413 07/22/23 1651 07/27/23 1633 08/05/23 1634 08/19/23 1421   Exercise Comments Pt's first day in the CRP2 program. No complaints with todays sesssion. Pt voiced that bike was too easy, will increase to level 2 next session. Reviewed METs and goals. Pt is making progress on goals and METs. No changes in exercise Rx today. Reviewed Home exercise Rx. Pt is walking at home 2x/week for 30 and will continue this. Pt is considering going back to her gym to do more resistance training. Pt verbalized understanding of the home exercise Rx and was provided a copy. Reviewed METs. Pt is making progress. Increased  workload on bike today. Reviewed METs and Goals. Pt reports  that she is learning a lot about her heart, exercise, mental health, and diet in the CR education classes. Pt is performing a similar exericse routine at the gym. She enjoys the NS and is considering buying one for home. Pt reports improvement in flexibility, balance, and stamina. Pt making good progress with exercise. She is reporting some L ankle discomfort when doing ankle rotations. I advised her to stop performing these stretches and f/u with a ankle specialist.      Exercise Goals and Review:   Exercise Goals     Row Name 06/23/23 1513             Exercise Goals   Increase Physical Activity Yes       Intervention Provide advice, education, support and counseling about physical activity/exercise needs.;Develop an individualized exercise prescription for aerobic and resistive training based on initial evaluation findings, risk stratification, comorbidities and participant's personal goals.       Expected Outcomes Short Term: Attend rehab on a regular basis to increase amount of physical activity.;Long Term: Exercising regularly at least 3-5 days a week.;Long Term: Add in home exercise to make exercise part of routine and to increase amount of physical activity.       Increase Strength and Stamina Yes       Intervention Provide advice, education, support and counseling about physical activity/exercise needs.;Develop an individualized exercise prescription for aerobic and resistive training based on initial evaluation findings, risk stratification, comorbidities and participant's personal goals.       Expected Outcomes Short Term: Increase workloads from initial exercise prescription for resistance, speed, and METs.;Short Term: Perform resistance training exercises routinely during rehab and add in resistance training at home;Long Term: Improve cardiorespiratory fitness, muscular endurance and strength as measured by increased METs and functional capacity ( )       Able to understand and  use rate of perceived exertion (RPE) scale Yes       Intervention Provide education and explanation on how to use RPE scale       Expected Outcomes Short Term: Able to use RPE daily in rehab to express subjective intensity level;Long Term:  Able to use RPE to guide intensity level when exercising independently       Knowledge and understanding of Target Heart Rate Range (THRR) Yes       Intervention Provide education and explanation of THRR including how the numbers were predicted and where they are located for reference       Expected Outcomes Short Term: Able to state/look up THRR;Long Term: Able to use THRR to govern intensity when exercising independently;Short Term: Able to use daily as guideline for intensity in rehab       Understanding of Exercise Prescription Yes       Intervention Provide education, explanation, and written materials on patient's individual exercise prescription       Expected Outcomes Short Term: Able to explain program exercise prescription;Long Term: Able to explain home exercise prescription to exercise independently          Exercise Goals Re-Evaluation :  Exercise Goals Re-Evaluation     Row Name 06/29/23 1411 07/22/23 1649 08/19/23 1413         Exercise Goal Re-Evaluation   Exercise Goals Review Increase Physical Activity;Increase Strength and Stamina;Able to understand and use rate of perceived exertion (RPE) scale;Knowledge and understanding of Target Heart Rate Range (THRR);Understanding of Exercise Prescription Increase Physical Activity;Increase  Strength and Stamina;Able to understand and use rate of perceived exertion (RPE) scale;Knowledge and understanding of Target Heart Rate Range (THRR);Understanding of Exercise Prescription Increase Physical Activity;Increase Strength and Stamina;Able to understand and use rate of perceived exertion (RPE) scale;Knowledge and understanding of Target Heart Rate Range (THRR);Understanding of Exercise Prescription      Comments Pt's first day in the CRP2 program. Pt understands the Exercise Rx, RPE scale and THHR Reviewed METs and goals. Pt voices improvement in her knowlege of heart health though the education classes. Pt likes her exercise routine that she is doing her, which is also a goal. we will discuss home exercise soon. Reviewed METs and Goals. Pt reports that she is learning a lot about her heart, exercise, mental health, and diet in the CR education classes. Pt is performing a similar exericse routine at the gym. She enjoys the NS and is considering buying one for home. Pt reports improvement in flexibility, balance, and stamina.     Expected Outcomes Will continue to monitor patient and progress exercise workloads as tolerated. Will continue to monitor patient and progress exercise workloads as tolerated. Will continue to monitor patient and progress exercise workloads as tolerated.        Discharge Exercise Prescription (Final Exercise Prescription Changes):  Exercise Prescription Changes - 09/14/23 1600       Response to Exercise   Blood Pressure (Admit) 106/62    Blood Pressure (Exit) 102/60    Heart Rate (Admit) 70 bpm    Heart Rate (Exercise) 99 bpm    Heart Rate (Exit) 79 bpm    Rating of Perceived Exertion (Exercise) 12    Symptoms None    Comments Reviewed METs    Duration Continue with 30 min of aerobic exercise without signs/symptoms of physical distress.    Intensity THRR unchanged      Progression   Progression Continue to progress workloads to maintain intensity without signs/symptoms of physical distress.    Average METs 3      Resistance Training   Training Prescription No    Weight No weights on Wednesdays      Interval Training   Interval Training No      Recumbant Bike   Level 5    RPM 55    Watts 33    Minutes 15    METs 3.2      NuStep   Level 5    SPM 89    Minutes 15    METs 2.4      Home Exercise Plan   Plans to continue exercise at Home (comment)     Frequency Add 2 additional days to program exercise sessions.    Initial Home Exercises Provided 07/27/23          Nutrition:  Target Goals: Understanding of nutrition guidelines, daily intake of sodium 1500mg , cholesterol 200mg , calories 30% from fat and 7% or less from saturated fats, daily to have 5 or more servings of fruits and vegetables.  Biometrics:  Pre Biometrics - 06/23/23 1030       Pre Biometrics   Waist Circumference 36.5 inches    Hip Circumference 44 inches    Waist to Hip Ratio 0.83 %    Triceps Skinfold 15 mm    % Body Fat 37.4 %    Grip Strength 23 kg    Flexibility 10 in    Single Leg Stand 11.5 seconds          Post Biometrics - 09/07/23 1304  Post  Biometrics   Height 5' 1 (1.549 m)    Weight 70.7 kg    Waist Circumference 36.5 inches    Hip Circumference 44 inches    Waist to Hip Ratio 0.83 %    BMI (Calculated) 29.47    Triceps Skinfold 15 mm    % Body Fat 37.2 %    Grip Strength 17 kg    Flexibility 11.9 in    Single Leg Stand 12.56 seconds          Nutrition Therapy Plan and Nutrition Goals:  Nutrition Therapy & Goals - 08/26/23 0952       Nutrition Therapy   Diet Heart Healthy Diet    Drug/Food Interactions Statins/Certain Fruits      Personal Nutrition Goals   Nutrition Goal Patient to identify strategies for reducing cardiovascular risk by attending the [facility] education and nutrition series weekly.   goal in action.   Personal Goal #2 Patient to improve diet quality by using the plate method as a guide for meal planning to include lean protein/plant protein, fruits, vegetables, whole grains, nonfat dairy as part of a well-balanced diet.   goal in action.   Comments Goals in action. [name] has medical history of HTN, CAD, STEMI, renal cell carcinmoa s/p partial L nephrectomy. She has been taking wegovy  for weight loss; per documentation, she is down ~45# over the last year (91.4kg on 05/21/2022). LDL has improved to goal  of <55 (49). A1c is well controlled in a normal range, previously in a prediabetic range but has improved with weight loss/wegovy . She has maintained her weight since starting with our program. She has attended the [facility] education/nutrition series regularly. Patient will benefit from participation in intensive cardiac rehab for nutrition, exercise, and lifestyle modification.      Intervention Plan   Intervention Prescribe, educate and counsel regarding individualized specific dietary modifications aiming towards targeted core components such as weight, hypertension, lipid management, diabetes, heart failure and other comorbidities.;Nutrition handout(s) given to patient.    Expected Outcomes Short Term Goal: Understand basic principles of dietary content, such as calories, fat, sodium, cholesterol and nutrients.;Long Term Goal: Adherence to prescribed nutrition plan.          Nutrition Assessments:  Nutrition Assessments - 08/26/23 0945       Rate Your Plate Scores   Pre Score 56    Post Score 84         MEDIFICTS Score Key: >=70 Need to make dietary changes  40-70 Heart Healthy Diet <= 40 Therapeutic Level Cholesterol Diet   Flowsheet Row INTENSIVE CARDIAC REHAB from 08/26/2023 in [facility] for Heart, Vascular, & Lung Health  Picture Your Plate Total Score on Admission 56  Picture Your Plate Total Score on Discharge 84   Picture Your Plate Scores: <29 Unhealthy dietary pattern with much room for improvement. 41-50 Dietary pattern unlikely to meet recommendations for good health and room for improvement. 51-60 More healthful dietary pattern, with some room for improvement.  >60 Healthy dietary pattern, although there may be some specific behaviors that could be improved.    Nutrition Goals Re-Evaluation:  Nutrition Goals Re-Evaluation     Row Name 06/30/23 1533 07/29/23 1437 08/26/23 0952         Goals   Current Weight 156 lb 1.4 oz (70.8 kg)  156 lb 8.4 oz (71 kg) 156 lb 8.4 oz (71 kg)     Comment LDL 106, A1c WNL lipids WNL, LDL  49, AST 58, ALT 72, A1c WNL no new labs; most recent labs lipids WNL, LDL 49, AST 58, ALT 72, A1c WNL     Expected Outcome [name] has medical history of HTN, CAD, STEMI, renal cell carcinmoa s/p partial L nephrectomy. She has been taking wegovy  for weight loss; per documentation, she is down ~45# over the last year (91.4kg on 05/21/2022). LDL remains above goal of <72. A1c is well controlled in a normal range, previously in a prediabetic range but has improved with weight loss/wegovy . Patient will benefit from participation in intensive cardiac rehab for nutrition, exercise, and lifestyle modification. Goals in progress. [name] has medical history of HTN, CAD, STEMI, renal cell carcinmoa s/p partial L nephrectomy. She has been taking wegovy  for weight loss; per documentation, she is down ~45# over the last year (91.4kg on 05/21/2022). LDL has improved to goal of <55 (49). A1c is well controlled in a normal range, previously in a prediabetic range but has improved with weight loss/wegovy . She has maintained her weight since starting with our program. Patient will benefit from participation in intensive cardiac rehab for nutrition, exercise, and lifestyle modification. Goals in action. [name] has medical history of HTN, CAD, STEMI, renal cell carcinmoa s/p partial L nephrectomy. She has been taking wegovy  for weight loss; per documentation, she is down ~45# over the last year (91.4kg on 05/21/2022). LDL has improved to goal of <55 (49). A1c is well controlled in a normal range, previously in a prediabetic range but has improved with weight loss/wegovy . She has maintained her weight since starting with our program. She has attended the [facility] education/nutrition series regularly. Patient will benefit from participation in intensive cardiac rehab for nutrition, exercise, and lifestyle modification.        Nutrition Goals  Re-Evaluation:  Nutrition Goals Re-Evaluation     Row Name 06/30/23 1533 07/29/23 1437 08/26/23 0952         Goals   Current Weight 156 lb 1.4 oz (70.8 kg) 156 lb 8.4 oz (71 kg) 156 lb 8.4 oz (71 kg)     Comment LDL 106, A1c WNL lipids WNL, LDL 49, AST 58, ALT 72, A1c WNL no new labs; most recent labs lipids WNL, LDL 49, AST 58, ALT 72, A1c WNL     Expected Outcome Jasime has medical history of HTN, CAD, STEMI, renal cell carcinmoa s/p partial L nephrectomy. She has been taking wegovy  for weight loss; per documentation, she is down ~45# over the last year (91.4kg on 05/21/2022). LDL remains above goal of <53. A1c is well controlled in a normal range, previously in a prediabetic range but has improved with weight loss/wegovy . Patient will benefit from participation in intensive cardiac rehab for nutrition, exercise, and lifestyle modification. Goals in progress. [name] has medical history of HTN, CAD, STEMI, renal cell carcinmoa s/p partial L nephrectomy. She has been taking wegovy  for weight loss; per documentation, she is down ~45# over the last year (91.4kg on 05/21/2022). LDL has improved to goal of <55 (49). A1c is well controlled in a normal range, previously in a prediabetic range but has improved with weight loss/wegovy . She has maintained her weight since starting with our program. Patient will benefit from participation in intensive cardiac rehab for nutrition, exercise, and lifestyle modification. Goals in action. [name] has medical history of HTN, CAD, STEMI, renal cell carcinmoa s/p partial L nephrectomy. She has been taking wegovy  for weight loss; per documentation, she is down ~45# over the last year (91.4kg on 05/21/2022).  LDL has improved to goal of <55 (49). A1c is well controlled in a normal range, previously in a prediabetic range but has improved with weight loss/wegovy . She has maintained her weight since starting with our program. She has attended the [facility] education/nutrition series  regularly. Patient will benefit from participation in intensive cardiac rehab for nutrition, exercise, and lifestyle modification.        Nutrition Goals Discharge (Final Nutrition Goals Re-Evaluation):  Nutrition Goals Re-Evaluation - 08/26/23 1191       Goals   Current Weight 156 lb 8.4 oz (71 kg)    Comment no new labs; most recent labs lipids WNL, LDL 49, AST 58, ALT 72, A1c WNL    Expected Outcome Goals in action. Shyteria has medical history of HTN, CAD, STEMI, renal cell carcinmoa s/p partial L nephrectomy. She has been taking wegovy  for weight loss; per documentation, she is down ~45# over the last year (91.4kg on 05/21/2022). LDL has improved to goal of <55 (49). A1c is well controlled in a normal range, previously in a prediabetic range but has improved with weight loss/wegovy . She has maintained her weight since starting with our program. She has attended the [facility] education/nutrition series regularly. Patient will benefit from participation in intensive cardiac rehab for nutrition, exercise, and lifestyle modification.          Psychosocial: Target Goals: Acknowledge presence or absence of significant depression and/or stress, maximize coping skills, provide positive support system. Participant is able to verbalize types and ability to use techniques and skills needed for reducing stress and depression.  Initial Review & Psychosocial Screening:  Initial Psych Review & Screening - 06/23/23 1604       Initial Review   Current issues with Current Depression;History of Depression;Current Anxiety/Panic;Current Stress Concerns    Source of Stress Concerns Chronic Illness      Family Dynamics   Good Support System? Yes      Barriers   Psychosocial barriers to participate in program The patient should benefit from training in stress management and relaxation.;Psychosocial barriers identified (see note)      Screening Interventions   Interventions Encouraged to exercise;To  provide support and resources with identified psychosocial needs;Provide feedback about the scores to participant;Program counselor consult    Expected Outcomes Short Term goal: Utilizing psychosocial counselor, staff and physician to assist with identification of specific Stressors or current issues interfering with healing process. Setting desired goal for each stressor or current issue identified.;Long Term Goal: Stressors or current issues are controlled or eliminated.;Short Term goal: Identification and review with participant of any Quality of Life or Depression concerns found by scoring the questionnaire.;Long Term goal: The participant improves quality of Life and PHQ9 Scores as seen by post scores and/or verbalization of changes   Nurse aware and will provide counseling resources for pt next week when she comes for exercise.         Quality of Life Scores:  Quality of Life - 08/30/23 0747       Quality of Life   Select Quality of Life      Quality of Life Scores   Health/Function Pre 24 %    Health/Function Post 27.2 %    Health/Function % Change 13.33 %    Socioeconomic Pre 30 %    Socioeconomic Post 30 %    Socioeconomic % Change  0 %    Psych/Spiritual Pre 24 %    Psych/Spiritual Post 30 %    Psych/Spiritual % Change 25 %  Family Pre 30 %    Family Post 30 %    Family % Change 0 %    GLOBAL Pre 26 %    GLOBAL Post 28.76 %    GLOBAL % Change 10.62 %         Scores of 19 and below usually indicate a poorer quality of life in these areas.  A difference of  2-3 points is a clinically meaningful difference.  A difference of 2-3 points in the total score of the Quality of Life Index has been associated with significant improvement in overall quality of life, self-image, physical symptoms, and general health in studies assessing change in quality of life.  PHQ-9: Review Flowsheet  More data exists      06/27/2023 06/23/2023 11/17/2022 10/20/2022 07/22/2022  Depression  screen PHQ 2/9  Decreased Interest 0 0 0 0 0  Down, Depressed, Hopeless 0 1 0 0 1  PHQ - 2 Score 0 1 0 0 1  Altered sleeping 0 0 - - -  Tired, decreased energy 1 1 - - -  Change in appetite 0 0 - - -  Feeling bad or failure about yourself  0 0 - - -  Trouble concentrating 0 0 - - -  Moving slowly or fidgety/restless 0 0 - - -  Suicidal thoughts 0 0 - - -  PHQ-9 Score 1 2 - - -  Difficult doing work/chores Not difficult at all Not difficult at all - - -   Interpretation of Total Score  Total Score Depression Severity:  1-4 = Minimal depression, 5-9 = Mild depression, 10-14 = Moderate depression, 15-19 = Moderately severe depression, 20-27 = Severe depression   Psychosocial Evaluation and Intervention:   Psychosocial Re-Evaluation:  Psychosocial Re-Evaluation     Row Name 06/30/23 [phone] 07/26/23 9604 08/23/23 1541 09/20/23 1720       Psychosocial Re-Evaluation   Current issues with Current Depression;History of Depression;Current Stress Concerns;Current Anxiety/Panic;Current Psychotropic Meds Current Depression;History of Depression;Current Stress Concerns;Current Anxiety/Panic;Current Psychotropic Meds Current Depression;History of Depression;Current Stress Concerns;Current Anxiety/Panic;Current Psychotropic Meds Current Depression;History of Depression;Current Stress Concerns;Current Anxiety/Panic;Current Psychotropic Meds    Comments Pt has a history of depression and says that the current antidepressants she is taking is controlling her depression. Rosellen says that she has felt  anxious since her Sharlon Deacon and is says she worried if she travels out of [place] will she get the same care although [place] attend church in [place]. Reassured the patient that our staff is adequately trained and will provide good care to her while she participates in cardiac rehab. Berania says that she has received counseling in the past and would like to talk to a counselor. Will send referral to  [facility]. Will notify patient's primary care provider Dr Marylee Snowball. Pt has a history of depression and says that the current antidepressants she is taking is controlling her depression. Soriah has not voiced any increased concerns or stressors during exercise at cardiac rehab [name] has not voiced any increased concerns or stressors during exercise at cardiac rehab. [name] says she has been meeting with a counselor and has found it helpful [name] has not voiced any increased concerns or stressors during exercise at cardiac rehab. [name] says she has been meeting with a counselor and has found it helpful. [name] continues to feel better and will tenatively complete cardiac rehab on 09/23/23    Expected Outcomes [name] will have decreased or controlled depression/ anxiety  upon completion of cardiac  [name] will have decreased or controlled depression/ anxiety  upon completion of cardiac [name] will have decreased or controlled depression/ anxiety  upon completion of cardiac [name] will have decreased or controlled depression/ anxiety  upon completion of cardiac    Interventions Therapist referral;Stress management education;Encouraged to attend Cardiac Rehabilitation for the exercise;Physician referral;Relaxation education Therapist referral;Stress management education;Encouraged to attend Cardiac Rehabilitation for the exercise;Physician referral;Relaxation education Therapist referral;Stress management education;Encouraged to attend Cardiac Rehabilitation for the exercise;Physician referral;Relaxation education Therapist referral;Stress management education;Encouraged to attend Cardiac Rehabilitation for the exercise;Physician referral;Relaxation education    Continue Psychosocial Services  Follow up required by staff Follow up required by staff Follow up required by staff Follow up required by staff      Initial Review   Source of Stress Concerns Chronic Illness;Unable to perform yard/household  activities;Poor Coping Skills Chronic Illness;Unable to perform yard/household activities;Poor Coping Skills Chronic Illness;Unable to perform yard/household activities;Poor Coping Skills Chronic Illness;Unable to perform yard/household activities;Poor Coping Skills    Comments Will continue to monitor and offer support as needed. Will continue to monitor and offer support as needed. Will continue to monitor and offer support as needed. Will continue to monitor and offer support as needed.       Psychosocial Discharge (Final Psychosocial Re-Evaluation):  Psychosocial Re-Evaluation - 09/20/23 1720       Psychosocial Re-Evaluation   Current issues with Current Depression;History of Depression;Current Stress Concerns;Current Anxiety/Panic;Current Psychotropic Meds    Comments [name] has not voiced any increased concerns or stressors during exercise at cardiac rehab. [name] says she has been meeting with a counselor and has found it helpful. [name] continues to feel better and will tenatively complete cardiac rehab on 09/23/23    Expected Outcomes Yaneth will have decreased or controlled depression/ anxiety  upon completion of cardiac    Interventions Therapist referral;Stress management education;Encouraged to attend Cardiac Rehabilitation for the exercise;Physician referral;Relaxation education    Continue Psychosocial Services  Follow up required by staff      Initial Review   Source of Stress Concerns Chronic Illness;Unable to perform yard/household activities;Poor Coping Skills    Comments Will continue to monitor and offer support as needed.          Vocational Rehabilitation: Provide vocational rehab assistance to qualifying candidates.   Vocational Rehab Evaluation & Intervention:  Vocational Rehab - 06/23/23 1528       Initial Vocational Rehab Evaluation & Intervention   Assessment shows need for Vocational Rehabilitation No   Pt is retired         Education: Education  Goals: Education classes will be provided on a weekly basis, covering required topics. Participant will state understanding/return demonstration of topics presented.    Education     Row Name 06/29/23 1300     Education   Cardiac Education Topics Pritikin   Orthoptist   Educator Dietitian   Weekly Topic Comforting Weekend Breakfasts   Instruction Review Code 1- Verbalizes Understanding   Class Start Time 1145   Class Stop Time 1228   Class Time Calculation (min) 43 min    Row Name 07/01/23 1300     Education   Cardiac Education Topics Pritikin   Nurse, children's   Educator Dietitian   Select Nutrition   Nutrition Dining Out - Part 1   Instruction Review Code 1- Verbalizes Understanding   Class Start Time 1145   Class Stop Time 1222  Class Time Calculation (min) 37 min    Row Name 07/04/23 1100     Education   Cardiac Education Topics Pritikin   Select Core Videos     Core Videos   Educator Exercise Physiologist   Select Exercise Education   Exercise Education Biomechanial Limitations   Instruction Review Code 1- Verbalizes Understanding   Class Start Time 1145   Class Stop Time 1218   Class Time Calculation (min) 33 min    Row Name 07/06/23 1200     Education   Cardiac Education Topics Pritikin   Customer service manager   Weekly Topic Fast Evening Meals   Instruction Review Code 1- Verbalizes Understanding   Class Start Time 1145   Class Stop Time 1225   Class Time Calculation (min) 40 min    Row Name 07/11/23 1500     Education   Cardiac Education Topics Pritikin   Glass blower/designer Nutrition   Nutrition Workshop Fueling a Forensic psychologist   Instruction Review Code 1- TEFL teacher Understanding   Class Start Time 1145   Class Stop Time 1230   Class Time Calculation (min) 45 min    Row Name 07/13/23 1500      Education   Cardiac Education Topics Pritikin   Customer service manager   Weekly Topic International Cuisine- Spotlight on the [employer] Zones   Instruction Review Code 1- Verbalizes Understanding   Class Start Time 1145   Class Stop Time 1225   Class Time Calculation (min) 40 min    Row Name 07/15/23 1100     Education   Cardiac Education Topics Pritikin   Psychologist, forensic Exercise Education   Exercise Education Improving Performance   Instruction Review Code 1- Verbalizes Understanding   Class Start Time 1145   Class Stop Time 1225   Class Time Calculation (min) 40 min    Row Name 07/18/23 1600     Education   Cardiac Education Topics Pritikin   Geographical information systems officer Psychosocial   Psychosocial Workshop Healthy Sleep for a Healthy Heart   Instruction Review Code 1- Verbalizes Understanding   Class Start Time 1148   Class Stop Time 1234   Class Time Calculation (min) 46 min    Row Name 07/22/23 1200     Education   Cardiac Education Topics Pritikin   Nurse, children's Exercise Physiologist   Select Psychosocial   Psychosocial How Our Thoughts Can Heal Our Hearts   Instruction Review Code 1- Verbalizes Understanding   Class Start Time 1146   Class Stop Time 1220   Class Time Calculation (min) 34 min    Row Name 07/25/23 1300     Education   Cardiac Education Topics Pritikin   Select Workshops     Workshops   Educator Exercise Physiologist   Select Exercise   Exercise Workshop Managing Heart Disease: Your Path to a Healthier Heart   Instruction Review Code 1- Verbalizes Understanding   Class Start Time 1145   Class Stop Time 1237   Class Time Calculation (min) 52 min    Row Name 07/27/23 1200  Education   Cardiac Education Topics Pritikin   Teacher, music   Weekly Topic Powerhouse Plant-Based Proteins   Instruction Review Code 1- Verbalizes Understanding   Class Start Time 1145   Class Stop Time 1222   Class Time Calculation (min) 37 min    Row Name 07/29/23 1300     Education   Cardiac Education Topics Pritikin   Administrator, Civil Service Education   General Education Hypertension and Heart Disease   Instruction Review Code 1- Verbalizes Understanding   Class Start Time 1145   Class Stop Time 1225   Class Time Calculation (min) 40 min    Row Name 08/01/23 1300     Education   Cardiac Education Topics Pritikin   Geographical information systems officer Psychosocial   Psychosocial Workshop From Head to Heart: The Power of a Healthy Outlook   Instruction Review Code 1- Verbalizes Understanding   Class Start Time 1147   Class Stop Time 1234   Class Time Calculation (min) 47 min    Row Name 08/05/23 1200     Education   Cardiac Education Topics Pritikin   Hospital doctor Education   General Education Heart Disease Risk Reduction   Instruction Review Code 1- Verbalizes Understanding   Class Start Time 1146   Class Stop Time 1223   Class Time Calculation (min) 37 min    Row Name 08/08/23 1100     Education   Cardiac Education Topics Pritikin   Nurse, children's Exercise Physiologist   Select Psychosocial   Psychosocial Healthy Minds, Bodies, Hearts   Instruction Review Code 1- Verbalizes Understanding   Class Start Time 1147   Class Stop Time 1221   Class Time Calculation (min) 34 min    Row Name 08/10/23 1200     Education   Cardiac Education Topics Pritikin   Customer service manager   Weekly Topic Adding Flavor - Sodium-Free    Instruction Review Code 1- Verbalizes Understanding   Class Start Time 1145   Class Stop Time 1221   Class Time Calculation (min) 36 min    Row Name 08/12/23 1300     Education   Cardiac Education Topics Pritikin   Select Workshops     Workshops   Educator Exercise Physiologist   Select Exercise   Exercise Workshop Location manager and Fall Prevention   Instruction Review Code 1- Verbalizes Understanding   Class Start Time 1145   Class Stop Time 1237   Class Time Calculation (min) 52 min    Row Name 08/15/23 1300     Education   Cardiac Education Topics Pritikin   Glass blower/designer Nutrition   Nutrition Workshop Label Reading   Instruction Review Code 1- Verbalizes Understanding   Class Start Time 1145   Class Stop Time 1228   Class Time Calculation (min) 43 min    Row Name 08/17/23 1100     Education   Cardiac Education Topics Pritikin   Nurse, children's  Educator Exercise Physiologist   Select Nutrition   Nutrition Other   Instruction Review Code 1- Verbalizes Understanding   Class Start Time 1152   Class Stop Time 1230   Class Time Calculation (min) 38 min    Row Name 08/19/23 1500     Education   Cardiac Education Topics Pritikin   Customer service manager   Weekly Topic Fast and Healthy Breakfasts   Instruction Review Code 1- Verbalizes Understanding   Class Start Time 1145   Class Stop Time 1220   Class Time Calculation (min) 35 min    Row Name 08/22/23 1100     Education   Cardiac Education Topics Pritikin   Hospital doctor Education   General Education Metabolic Syndrome and Belly Fat   Instruction Review Code 1- Verbalizes Understanding   Class Start Time 1150   Class Stop Time 1230   Class Time Calculation (min) 40 min    Row Name 08/24/23 1400     Education    Cardiac Education Topics Pritikin   Orthoptist   Educator Dietitian   Weekly Topic Personalizing Your Pritikin Plate   Instruction Review Code 1- Verbalizes Understanding   Class Start Time 1145   Class Stop Time 1220   Class Time Calculation (min) 35 min    Row Name 08/26/23 1300     Education   Cardiac Education Topics Pritikin   Nurse, children's   Educator Exercise Physiologist   Select Nutrition   Nutrition Overview of the Pritikin Eating Plan   Instruction Review Code 1- Verbalizes Understanding   Class Start Time 1150   Class Stop Time 1230   Class Time Calculation (min) 40 min    Row Name 08/31/23 1500     Education   Cardiac Education Topics Pritikin   Orthoptist   Educator Dietitian   Weekly Topic Delicious Desserts   Instruction Review Code 1- Verbalizes Understanding   Class Start Time 1145   Class Stop Time 1225   Class Time Calculation (min) 40 min    Row Name 09/02/23 1500     Education   Cardiac Education Topics Pritikin   Licensed conveyancer Nutrition   Nutrition Calorie Density   Instruction Review Code 1- Verbalizes Understanding   Class Start Time 1150   Class Stop Time 1222   Class Time Calculation (min) 32 min    Row Name 09/05/23 1300     Education   Cardiac Education Topics Pritikin   [employer]     Workshops   Educator Exercise Physiologist   Select Exercise   Exercise Workshop Exercise Basics: Building Your Action Plan   Instruction Review Code 1- Verbalizes Understanding   Class Start Time 1157   Class Stop Time 1243   Class Time Calculation (min) 46 min    Row Name 09/07/23 1500     Education   Cardiac Education Topics Pritikin   Customer service manager   Weekly Topic Efficiency Cooking - Meals in a Snap   Instruction Review Code 1- Verbalizes  Understanding   Class Start Time 1145  Class Stop Time 1222   Class Time Calculation (min) 37 min    Row Name 09/12/23 1300     Education   Cardiac Education Topics Pritikin   Glass blower/designer Nutrition   Nutrition Workshop Targeting Your Nutrition Priorities   Instruction Review Code 1- Verbalizes Understanding   Class Start Time 1150   Class Stop Time 1230   Class Time Calculation (min) 40 min    Row Name 09/14/23 1200     Education   Cardiac Education Topics Pritikin   Customer service manager   Weekly Topic One-Pot Wonders   Instruction Review Code 1- Verbalizes Understanding   Class Start Time 1145   Class Stop Time 1220   Class Time Calculation (min) 35 min    Row Name 09/19/23 1300     Education   Cardiac Education Topics Pritikin   Geographical information systems officer Psychosocial   Psychosocial Workshop Focused Goals, Sustainable Changes   Instruction Review Code 1- Verbalizes Understanding   Class Start Time 1155   Class Stop Time 1235   Class Time Calculation (min) 40 min      Core Videos: Exercise    Move It!  Clinical staff conducted group or individual video education with verbal and written material and guidebook.  Patient learns the recommended Pritikin exercise program. Exercise with the goal of living a long, healthy life. Some of the health benefits of exercise include controlled diabetes, healthier blood pressure levels, improved cholesterol levels, improved heart and lung capacity, improved sleep, and better body composition. Everyone should speak with their doctor before starting or changing an exercise routine.  Biomechanical Limitations Clinical staff conducted group or individual video education with verbal and written material and guidebook.  Patient learns how biomechanical limitations can impact exercise and how we  can mitigate and possibly overcome limitations to have an impactful and balanced exercise routine.  Body Composition Clinical staff conducted group or individual video education with verbal and written material and guidebook.  Patient learns that body composition (ratio of muscle mass to fat mass) is a key component to assessing overall fitness, rather than body weight alone. Increased fat mass, especially visceral belly fat, can put us  at increased risk for metabolic syndrome, type 2 diabetes, heart disease, and even death. It is recommended to combine diet and exercise (cardiovascular and resistance training) to improve your body composition. Seek guidance from your physician and exercise physiologist before implementing an exercise routine.  Exercise Action Plan Clinical staff conducted group or individual video education with verbal and written material and guidebook.  Patient learns the recommended strategies to achieve and enjoy long-term exercise adherence, including variety, self-motivation, self-efficacy, and positive decision making. Benefits of exercise include fitness, good health, weight management, more energy, better sleep, less stress, and overall well-being.  Medical   Heart Disease Risk Reduction Clinical staff conducted group or individual video education with verbal and written material and guidebook.  Patient learns our heart is our most vital organ as it circulates oxygen, nutrients, white blood cells, and hormones throughout the entire body, and carries waste away. Data supports a plant-based eating plan like the Pritikin Program for its effectiveness in slowing progression of and reversing heart disease. The video provides a number of recommendations to address heart disease.   Metabolic Syndrome and Belly  Fat  Clinical staff conducted group or individual video education with verbal and written material and guidebook.  Patient learns what metabolic syndrome is, how it leads  to heart disease, and how one can reverse it and keep it from coming back. You have metabolic syndrome if you have 3 of the following 5 criteria: abdominal obesity, high blood pressure, high triglycerides, low HDL cholesterol, and high blood sugar.  Hypertension and Heart Disease Clinical staff conducted group or individual video education with verbal and written material and guidebook.  Patient learns that high blood pressure, or hypertension, is very common in the [place] . Hypertension is largely due to excessive salt intake, but other important risk factors include being overweight, physical inactivity, drinking too much alcohol, smoking, and not eating enough potassium from fruits and vegetables. High blood pressure is a leading risk factor for heart attack, stroke, congestive heart failure, dementia, kidney failure, and premature death. Long-term effects of excessive salt intake include stiffening of the arteries and thickening of heart muscle and organ damage. Recommendations include ways to reduce hypertension and the risk of heart disease.  Diseases of Our Time - Focusing on Diabetes Clinical staff conducted group or individual video education with verbal and written material and guidebook.  Patient learns why the best way to stop diseases of our time is prevention, through food and other lifestyle changes. Medicine (such as prescription pills and surgeries) is often only a Band-Aid on the problem, not a long-term solution. Most common diseases of our time include obesity, type 2 diabetes, hypertension, heart disease, and cancer. The Pritikin Program is recommended and has been proven to help reduce, reverse, and/or prevent the damaging effects of metabolic syndrome.  Nutrition   Overview of the Pritikin Eating Plan  Clinical staff conducted group or individual video education with verbal and written material and guidebook.  Patient learns about the Pritikin Eating Plan for disease risk  reduction. The Pritikin Eating Plan emphasizes a wide variety of unrefined, minimally-processed carbohydrates, like fruits, vegetables, whole grains, and legumes. Go, Caution, and Stop food choices are explained. Plant-based and lean animal proteins are emphasized. Rationale provided for low sodium intake for blood pressure control, low added sugars for blood sugar stabilization, and low added fats and oils for coronary artery disease risk reduction and weight management.  Calorie Density  Clinical staff conducted group or individual video education with verbal and written material and guidebook.  Patient learns about calorie density and how it impacts the Pritikin Eating Plan. Knowing the characteristics of the food you choose will help you decide whether those foods will lead to weight gain or weight loss, and whether you want to consume more or less of them. Weight loss is usually a side effect of the Pritikin Eating Plan because of its focus on low calorie-dense foods.  Label Reading  Clinical staff conducted group or individual video education with verbal and written material and guidebook.  Patient learns about the Pritikin recommended label reading guidelines and corresponding recommendations regarding calorie density, added sugars, sodium content, and whole grains.  Dining Out - Part 1  Clinical staff conducted group or individual video education with verbal and written material and guidebook.  Patient learns that restaurant meals can be sabotaging because they can be so high in calories, fat, sodium, and/or sugar. Patient learns recommended strategies on how to positively address this and avoid unhealthy pitfalls.  Facts on Fats  Clinical staff conducted group or individual video education with verbal and written material  and guidebook.  Patient learns that lifestyle modifications can be just as effective, if not more so, as many medications for lowering your risk of heart disease. A  Pritikin lifestyle can help to reduce your risk of inflammation and atherosclerosis (cholesterol build-up, or plaque, in the artery walls). Lifestyle interventions such as dietary choices and physical activity address the cause of atherosclerosis. A review of the types of fats and their impact on blood cholesterol levels, along with dietary recommendations to reduce fat intake is also included.  Nutrition Action Plan  Clinical staff conducted group or individual video education with verbal and written material and guidebook.  Patient learns how to incorporate Pritikin recommendations into their lifestyle. Recommendations include planning and keeping personal health goals in mind as an important part of their success.  Healthy Mind-Set    Healthy Minds, Bodies, Hearts  Clinical staff conducted group or individual video education with verbal and written material and guidebook.  Patient learns how to identify when they are stressed. Video will discuss the impact of that stress, as well as the many benefits of stress management. Patient will also be introduced to stress management techniques. The way we think, act, and feel has an impact on our hearts.  How Our Thoughts Can Heal Our Hearts  Clinical staff conducted group or individual video education with verbal and written material and guidebook.  Patient learns that negative thoughts can cause depression and anxiety. This can result in negative lifestyle behavior and serious health problems. Cognitive behavioral therapy is an effective method to help control our thoughts in order to change and improve our emotional outlook.  Additional Videos:  Exercise    Improving Performance  Clinical staff conducted group or individual video education with verbal and written material and guidebook.  Patient learns to use a non-linear approach by alternating intensity levels and lengths of time spent exercising to help burn more calories and lose more body fat.  Cardiovascular exercise helps improve heart health, metabolism, hormonal balance, blood sugar control, and recovery from fatigue. Resistance training improves strength, endurance, balance, coordination, reaction time, metabolism, and muscle mass. Flexibility exercise improves circulation, posture, and balance. Seek guidance from your physician and exercise physiologist before implementing an exercise routine and learn your capabilities and proper form for all exercise.  Introduction to Yoga  Clinical staff conducted group or individual video education with verbal and written material and guidebook.  Patient learns about yoga, a discipline of the coming together of mind, breath, and body. The benefits of yoga include improved flexibility, improved range of motion, better posture and core strength, increased lung function, weight loss, and positive self-image. Yoga's heart health benefits include lowered blood pressure, healthier heart rate, decreased cholesterol and triglyceride levels, improved immune function, and reduced stress. Seek guidance from your physician and exercise physiologist before implementing an exercise routine and learn your capabilities and proper form for all exercise.  Medical   Aging: Enhancing Your Quality of Life  Clinical staff conducted group or individual video education with verbal and written material and guidebook.  Patient learns key strategies and recommendations to stay in good physical health and enhance quality of life, such as prevention strategies, having an advocate, securing a Health Care Proxy and Power of Attorney, and keeping a list of medications and system for tracking them. It also discusses how to avoid risk for bone loss.  Biology of Weight Control  Clinical staff conducted group or individual video education with verbal and written material and guidebook.  Patient learns that weight gain occurs because we consume more calories than we burn (eating more,  moving less). Even if your body weight is normal, you may have higher ratios of fat compared to muscle mass. Too much body fat puts you at increased risk for cardiovascular disease, heart attack, stroke, type 2 diabetes, and obesity-related cancers. In addition to exercise, following the Pritikin Eating Plan can help reduce your risk.  Decoding Lab Results  Clinical staff conducted group or individual video education with verbal and written material and guidebook.  Patient learns that lab test reflects one measurement whose values change over time and are influenced by many factors, including medication, stress, sleep, exercise, food, hydration, pre-existing medical conditions, and more. It is recommended to use the knowledge from this video to become more involved with your lab results and evaluate your numbers to speak with your doctor.   Diseases of Our Time - Overview  Clinical staff conducted group or individual video education with verbal and written material and guidebook.  Patient learns that according to the CDC, 50% to 70% of chronic diseases (such as obesity, type 2 diabetes, elevated lipids, hypertension, and heart disease) are avoidable through lifestyle improvements including healthier food choices, listening to satiety cues, and increased physical activity.  Sleep Disorders Clinical staff conducted group or individual video education with verbal and written material and guidebook.  Patient learns how good quality and duration of sleep are important to overall health and well-being. Patient also learns about sleep disorders and how they impact health along with recommendations to address them, including discussing with a physician.  Nutrition  Dining Out - Part 2 Clinical staff conducted group or individual video education with verbal and written material and guidebook.  Patient learns how to plan ahead and communicate in order to maximize their dining experience in a healthy and  nutritious manner. Included are recommended food choices based on the type of restaurant the patient is visiting.   Fueling a Banker conducted group or individual video education with verbal and written material and guidebook.  There is a strong connection between our food choices and our health. Diseases like obesity and type 2 diabetes are very prevalent and are in large-part due to lifestyle choices. The Pritikin Eating Plan provides plenty of food and hunger-curbing satisfaction. It is easy to follow, affordable, and helps reduce health risks.  Menu Workshop  Clinical staff conducted group or individual video education with verbal and written material and guidebook.  Patient learns that restaurant meals can sabotage health goals because they are often packed with calories, fat, sodium, and sugar. Recommendations include strategies to plan ahead and to communicate with the manager, chef, or server to help order a healthier meal.  Planning Your Eating Strategy  Clinical staff conducted group or individual video education with verbal and written material and guidebook.  Patient learns about the Pritikin Eating Plan and its benefit of reducing the risk of disease. The Pritikin Eating Plan does not focus on calories. Instead, it emphasizes high-quality, nutrient-rich foods. By knowing the characteristics of the foods, we choose, we can determine their calorie density and make informed decisions.  Targeting Your Nutrition Priorities  Clinical staff conducted group or individual video education with verbal and written material and guidebook.  Patient learns that lifestyle habits have a tremendous impact on disease risk and progression. This video provides eating and physical activity recommendations based on your personal health goals, such as reducing LDL cholesterol, losing  weight, preventing or controlling type 2 diabetes, and reducing high blood pressure.  Vitamins and  Minerals  Clinical staff conducted group or individual video education with verbal and written material and guidebook.  Patient learns different ways to obtain key vitamins and minerals, including through a recommended healthy diet. It is important to discuss all supplements you take with your doctor.   Healthy Mind-Set    Smoking Cessation  Clinical staff conducted group or individual video education with verbal and written material and guidebook.  Patient learns that cigarette smoking and tobacco addiction pose a serious health risk which affects millions of people. Stopping smoking will significantly reduce the risk of heart disease, lung disease, and many forms of cancer. Recommended strategies for quitting are covered, including working with your doctor to develop a successful plan.  Culinary   Becoming a Set designer conducted group or individual video education with verbal and written material and guidebook.  Patient learns that cooking at home can be healthy, cost-effective, quick, and puts them in control. Keys to cooking healthy recipes will include looking at your recipe, assessing your equipment needs, planning ahead, making it simple, choosing cost-effective seasonal ingredients, and limiting the use of added fats, salts, and sugars.  Cooking - Breakfast and Snacks  Clinical staff conducted group or individual video education with verbal and written material and guidebook.  Patient learns how important breakfast is to satiety and nutrition through the entire day. Recommendations include key foods to eat during breakfast to help stabilize blood sugar levels and to prevent overeating at meals later in the day. Planning ahead is also a key component.  Cooking - Educational psychologist conducted group or individual video education with verbal and written material and guidebook.  Patient learns eating strategies to improve overall health, including an approach  to cook more at home. Recommendations include thinking of animal protein as a side on your plate rather than center stage and focusing instead on lower calorie dense options like vegetables, fruits, whole grains, and plant-based proteins, such as beans. Making sauces in large quantities to freeze for later and leaving the skin on your vegetables are also recommended to maximize your experience.  Cooking - Healthy Salads and Dressing Clinical staff conducted group or individual video education with verbal and written material and guidebook.  Patient learns that vegetables, fruits, whole grains, and legumes are the foundations of the Pritikin Eating Plan. Recommendations include how to incorporate each of these in flavorful and healthy salads, and how to create homemade salad dressings. Proper handling of ingredients is also covered. Cooking - Soups and [employer] - Soups and Desserts Clinical staff conducted group or individual video education with verbal and written material and guidebook.  Patient learns that Pritikin soups and desserts make for easy, nutritious, and delicious snacks and meal components that are low in sodium, fat, sugar, and calorie density, while high in vitamins, minerals, and filling fiber. Recommendations include simple and healthy ideas for soups and desserts.   Overview     The Pritikin Solution Program Overview Clinical staff conducted group or individual video education with verbal and written material and guidebook.  Patient learns that the results of the Pritikin Program have been documented in more than 100 articles published in peer-reviewed journals, and the benefits include reducing risk factors for (and, in some cases, even reversing) high cholesterol, high blood pressure, type 2 diabetes, obesity, and more! An overview of the three key pillars  of the Pritikin Program will be covered: eating well, doing regular exercise, and having a healthy  mind-set.  WORKSHOPS  Exercise: Exercise Basics: Building Your Action Plan Clinical staff led group instruction and group discussion with PowerPoint presentation and patient guidebook. To enhance the learning environment the use of posters, models and videos may be added. At the conclusion of this workshop, patients will comprehend the difference between physical activity and exercise, as well as the benefits of incorporating both, into their routine. Patients will understand the FITT (Frequency, Intensity, Time, and Type) principle and how to use it to build an exercise action plan. In addition, safety concerns and other considerations for exercise and cardiac rehab will be addressed by the presenter. The purpose of this lesson is to promote a comprehensive and effective weekly exercise routine in order to improve patients' overall level of fitness.   Managing Heart Disease: Your Path to a Healthier Heart Clinical staff led group instruction and group discussion with PowerPoint presentation and patient guidebook. To enhance the learning environment the use of posters, models and videos may be added.At the conclusion of this workshop, patients will understand the anatomy and physiology of the heart. Additionally, they will understand how Pritikin's three pillars impact the risk factors, the progression, and the management of heart disease.  The purpose of this lesson is to provide a high-level overview of the heart, heart disease, and how the Pritikin lifestyle positively impacts risk factors.  Exercise Biomechanics Clinical staff led group instruction and group discussion with PowerPoint presentation and patient guidebook. To enhance the learning environment the use of posters, models and videos may be added. Patients will learn how the structural parts of their bodies function and how these functions impact their daily activities, movement, and exercise. Patients will learn how to promote a  neutral spine, learn how to manage pain, and identify ways to improve their physical movement in order to promote healthy living. The purpose of this lesson is to expose patients to common physical limitations that impact physical activity. Participants will learn practical ways to adapt and manage aches and pains, and to minimize their effect on regular exercise. Patients will learn how to maintain good posture while sitting, walking, and lifting.  Balance Training and Fall Prevention  Clinical staff led group instruction and group discussion with PowerPoint presentation and patient guidebook. To enhance the learning environment the use of posters, models and videos may be added. At the conclusion of this workshop, patients will understand the importance of their sensorimotor skills (vision, proprioception, and the vestibular system) in maintaining their ability to balance as they age. Patients will apply a variety of balancing exercises that are appropriate for their current level of function. Patients will understand the common causes for poor balance, possible solutions to these problems, and ways to modify their physical environment in order to minimize their fall risk. The purpose of this lesson is to teach patients about the importance of maintaining balance as they age and ways to minimize their risk of falling.  WORKSHOPS   Nutrition:  Fueling a Ship broker led group instruction and group discussion with PowerPoint presentation and patient guidebook. To enhance the learning environment the use of posters, models and videos may be added. Patients will review the foundational principles of the Pritikin Eating Plan and understand what constitutes a serving size in each of the food groups. Patients will also learn Pritikin-friendly foods that are better choices when away from home and review make-ahead meal  and snack options. Calorie density will be reviewed and applied to  three nutrition priorities: weight maintenance, weight loss, and weight gain. The purpose of this lesson is to reinforce (in a group setting) the key concepts around what patients are recommended to eat and how to apply these guidelines when away from home by planning and selecting Pritikin-friendly options. Patients will understand how calorie density may be adjusted for different weight management goals.  Mindful Eating  Clinical staff led group instruction and group discussion with PowerPoint presentation and patient guidebook. To enhance the learning environment the use of posters, models and videos may be added. Patients will briefly review the concepts of the Pritikin Eating Plan and the importance of low-calorie dense foods. The concept of mindful eating will be introduced as well as the importance of paying attention to internal hunger signals. Triggers for non-hunger eating and techniques for dealing with triggers will be explored. The purpose of this lesson is to provide patients with the opportunity to review the basic principles of the Pritikin Eating Plan, discuss the value of eating mindfully and how to measure internal cues of hunger and fullness using the Hunger Scale. Patients will also discuss reasons for non-hunger eating and learn strategies to use for controlling emotional eating.  Targeting Your Nutrition Priorities Clinical staff led group instruction and group discussion with PowerPoint presentation and patient guidebook. To enhance the learning environment the use of posters, models and videos may be added. Patients will learn how to determine their genetic susceptibility to disease by reviewing their family history. Patients will gain insight into the importance of diet as part of an overall healthy lifestyle in mitigating the impact of genetics and other environmental insults. The purpose of this lesson is to provide patients with the opportunity to assess their personal nutrition  priorities by looking at their family history, their own health history and current risk factors. Patients will also be able to discuss ways of prioritizing and modifying the Pritikin Eating Plan for their highest risk areas  Menu  Clinical staff led group instruction and group discussion with PowerPoint presentation and patient guidebook. To enhance the learning environment the use of posters, models and videos may be added. Using menus brought in from [employer], or printed from [employer], patients will apply the Pritikin dining out guidelines that were presented in the [employer] video. Patients will also be able to practice these guidelines in a variety of provided scenarios. The purpose of this lesson is to provide patients with the opportunity to practice hands-on learning of the Pritikin Dining Out guidelines with actual menus and practice scenarios.  Label Reading Clinical staff led group instruction and group discussion with PowerPoint presentation and patient guidebook. To enhance the learning environment the use of posters, models and videos may be added. Patients will review and discuss the Pritikin label reading guidelines presented in Pritikin's Label Reading Educational series video. Using fool labels brought in from local grocery stores and markets, patients will apply the label reading guidelines and determine if the packaged food meet the Pritikin guidelines. The purpose of this lesson is to provide patients with the opportunity to review, discuss, and practice hands-on learning of the Pritikin Label Reading guidelines with actual packaged food labels. Cooking School  Pritikin's [employer] are designed to teach patients ways to prepare quick, simple, and affordable recipes at home. The importance of nutrition's role in chronic disease risk reduction is reflected in its emphasis in the overall  Pritikin program. By learning how to prepare essential  core Pritikin Eating Plan recipes, patients will increase control over what they eat; be able to customize the flavor of foods without the use of added salt, sugar, or fat; and improve the quality of the food they consume. By learning a set of core recipes which are easily assembled, quickly prepared, and affordable, patients are more likely to prepare more healthy foods at home. These workshops focus on convenient breakfasts, simple entres, side dishes, and desserts which can be prepared with minimal effort and are consistent with nutrition recommendations for cardiovascular risk reduction. Cooking [employer] are taught by a Armed forces logistics/support/administrative officer (RD) who has been trained by the [employer]. The chef or RD has a clear understanding of the importance of minimizing - if not completely eliminating - added fat, sugar, and sodium in recipes. Throughout the series of Cooking School Workshop sessions, patients will learn about healthy ingredients and efficient methods of cooking to build confidence in their capability to prepare    Cooking School weekly topics:  Adding Flavor- Sodium-Free  Fast and Healthy Breakfasts  Powerhouse Plant-Based Proteins  Satisfying Salads and Dressings  Simple Sides and Sauces  International Cuisine-Spotlight on the [employer] Zones  Delicious Desserts  Savory Soups  [employer] - Meals in a Astronomer Appetizers and Snacks  Comforting Weekend Breakfasts  One-Pot Wonders   Fast Evening Meals  Landscape architect Your Pritikin Plate  WORKSHOPS   Healthy Mindset (Psychosocial):  Focused Goals, Sustainable Changes Clinical staff led group instruction and group discussion with PowerPoint presentation and patient guidebook. To enhance the learning environment the use of posters, models and videos may be added. Patients will be able to apply effective goal setting strategies to establish at least one personal goal, and then take  consistent, meaningful action toward that goal. They will learn to identify common barriers to achieving personal goals and develop strategies to overcome them. Patients will also gain an understanding of how our mind-set can impact our ability to achieve goals and the importance of cultivating a positive and growth-oriented mind-set. The purpose of this lesson is to provide patients with a deeper understanding of how to set and achieve personal goals, as well as the tools and strategies needed to overcome common obstacles which may arise along the way.  From Head to Heart: The Power of a Healthy Outlook  Clinical staff led group instruction and group discussion with PowerPoint presentation and patient guidebook. To enhance the learning environment the use of posters, models and videos may be added. Patients will be able to recognize and describe the impact of emotions and mood on physical health. They will discover the importance of self-care and explore self-care practices which may work for them. Patients will also learn how to utilize the 4 C's to cultivate a healthier outlook and better manage stress and challenges. The purpose of this lesson is to demonstrate to patients how a healthy outlook is an essential part of maintaining good health, especially as they continue their cardiac rehab journey.  Healthy Sleep for a Healthy Heart Clinical staff led group instruction and group discussion with PowerPoint presentation and patient guidebook. To enhance the learning environment the use of posters, models and videos may be added. At the conclusion of this workshop, patients will be able to demonstrate knowledge of the importance of sleep to overall health, well-being, and quality of life. They will understand the symptoms of, and treatments  for, common sleep disorders. Patients will also be able to identify daytime and nighttime behaviors which impact sleep, and they will be able to apply these tools to help  manage sleep-related challenges. The purpose of this lesson is to provide patients with a general overview of sleep and outline the importance of quality sleep. Patients will learn about a few of the most common sleep disorders. Patients will also be introduced to the concept of "sleep hygiene," and discover ways to self-manage certain sleeping problems through simple daily behavior changes. Finally, the workshop will motivate patients by clarifying the links between quality sleep and their goals of heart-healthy living.   Recognizing and Reducing Stress Clinical staff led group instruction and group discussion with PowerPoint presentation and patient guidebook. To enhance the learning environment the use of posters, models and videos may be added. At the conclusion of this workshop, patients will be able to understand the types of stress reactions, differentiate between acute and chronic stress, and recognize the impact that chronic stress has on their health. They will also be able to apply different coping mechanisms, such as reframing negative self-talk. Patients will have the opportunity to practice a variety of stress management techniques, such as deep abdominal breathing, progressive muscle relaxation, and/or guided imagery.  The purpose of this lesson is to educate patients on the role of stress in their lives and to provide healthy techniques for coping with it.  Learning Barriers/Preferences:  Learning Barriers/Preferences - 06/23/23 1523       Learning Barriers/Preferences   Learning Barriers None    Learning Preferences Computer/Internet;Written Material          Education Topics:  Knowledge Questionnaire Score:  Knowledge Questionnaire Score - 08/30/23 0745       Knowledge Questionnaire Score   Pre Score 22/24    Post Score 23/24          Core Components/Risk Factors/Patient Goals at Admission:  Personal Goals and Risk Factors at Admission - 06/23/23 1607       Core  Components/Risk Factors/Patient Goals on Admission   Hypertension Yes    Intervention Provide education on lifestyle modifcations including regular physical activity/exercise, weight management, moderate sodium restriction and increased consumption of fresh fruit, vegetables, and low fat dairy, alcohol moderation, and smoking cessation.;Monitor prescription use compliance.    Expected Outcomes Short Term: Continued assessment and intervention until BP is < 140/75mm HG in hypertensive participants. < 130/60mm HG in hypertensive participants with diabetes, heart failure or chronic kidney disease.;Long Term: Maintenance of blood pressure at goal levels.    Lipids Yes    Intervention Provide education and support for participant on nutrition & aerobic/resistive exercise along with prescribed medications to achieve LDL 70mg , HDL >40mg .    Expected Outcomes Short Term: Participant states understanding of desired cholesterol values and is compliant with medications prescribed. Participant is following exercise prescription and nutrition guidelines.;Long Term: Cholesterol controlled with medications as prescribed, with individualized exercise RX and with personalized nutrition plan. Value goals: LDL < 70mg , HDL > 40 mg.    Stress Yes    Intervention Offer individual and/or small group education and counseling on adjustment to heart disease, stress management and health-related lifestyle change. Teach and support self-help strategies.;Refer participants experiencing significant psychosocial distress to appropriate mental health specialists for further evaluation and treatment. When possible, include family members and significant others in education/counseling sessions.    Expected Outcomes Short Term: Participant demonstrates changes in health-related behavior, relaxation and other stress management skills, ability  to obtain effective social support, and compliance with psychotropic medications if prescribed.;Long  Term: Emotional wellbeing is indicated by absence of clinically significant psychosocial distress or social isolation.    Personal Goal Other Yes    Personal Goal Short term: knowledge on heart health, Ex routine that she can stick with Long term: Balance, flexibility and stamina    Intervention Will continue to monitor pt and progress workloads as tolerated without sign or symptom    Expected Outcomes Pt will achieve her goals and gain strength          Core Components/Risk Factors/Patient Goals Review:   Goals and Risk Factor Review     Row Name 06/30/23 1039 07/05/23 0931 07/26/23 0810 08/23/23 1542 09/20/23 1722     Core Components/Risk Factors/Patient Goals Review   Personal Goals Review Weight Management/Obesity;Lipids;Stress;Hypertension Weight Management/Obesity;Lipids;Stress;Hypertension Weight Management/Obesity;Lipids;Stress;Hypertension Weight Management/Obesity;Lipids;Stress;Hypertension Weight Management/Obesity;Lipids;Stress;Hypertension   Review [name] started cardiac rehab on 06/29/23. [name] did well with exercise. Vital signs were stable. [name] started cardiac rehab on 06/29/23. [name] is off to a good start  with exercise. Vital signs have been stable. [name] is doing well  with exercise at cardiac rehab. Vital signs have been stable. [name] has been increasing her met level [name] continues to do well  with exercise at cardiac rehab. Vital signs have been stable. [name] has been increasing her met level [name] continues to do well  with exercise at cardiac rehab. Vital signs remain stable.   Expected Outcomes Tynika will continue to participate in cardiac rehab for exercise, nutrtion and lifestyle modifications Makai will continue to participate in cardiac rehab for exercise, nutrtion and lifestyle modifications Channie will continue to participate in cardiac rehab for exercise, nutrtion and lifestyle modifications Rosana will continue to participate in cardiac rehab for  exercise, nutrtion and lifestyle modifications Zuriyah will continue to participate in cardiac rehab for exercise, nutrtion and lifestyle modifications      Core Components/Risk Factors/Patient Goals at Discharge (Final Review):   Goals and Risk Factor Review - 09/20/23 1722       Core Components/Risk Factors/Patient Goals Review   Personal Goals Review Weight Management/Obesity;Lipids;Stress;Hypertension    Review Kacee continues to do well  with exercise at cardiac rehab. Vital signs remain stable.    Expected Outcomes Danyiel will continue to participate in cardiac rehab for exercise, nutrtion and lifestyle modifications          ITP Comments:  ITP Comments     Row Name 06/23/23 1017 06/30/23 0956 07/05/23 0928 07/26/23 0806 08/23/23 1540   ITP Comments Gaylyn Keas, MD: Medical Director.  Introduction to the [facility] /  Intensive Cardiac Rehab.  Initial orientation packet reviewed with the patient. 30 Day ITP Review. Preston started cardiac rehab on 06/29/23. Estefana did well with exercise. 30 Day ITP Review. Latika started cardiac rehab on 06/29/23. Doranne is off to a good start with exercise. 30 Day ITP Review.  Anaysha has good attendance and participation  with exercise at cardiac rehab 30 Day ITP Review.  Kanasia continues to have good attendance and participation  with exercise at cardiac rehab    Row Name 09/20/23 1718           ITP Comments 30 Day ITP Review.  Crystallynn continues to have good attendance and participation  with exercise at cardiac rehab. Gisell will tenatively complete cardiac rehab on 09/23/23          Comments: See ITP comments.Monte Antonio RN BSN

## 2023-09-21 ENCOUNTER — Encounter (HOSPITAL_COMMUNITY)
Admission: RE | Admit: 2023-09-21 | Discharge: 2023-09-21 | Disposition: A | Discharge status: Home / Self Care | Source: Ambulatory Visit | Attending: Internal Medicine | Admitting: Internal Medicine

## 2023-09-21 DIAGNOSIS — Z48812 Encounter for surgical aftercare following surgery on the circulatory system: Secondary | ICD-10-CM | POA: Diagnosis not present

## 2023-09-21 DIAGNOSIS — I213 ST elevation (STEMI) myocardial infarction of unspecified site: Secondary | ICD-10-CM

## 2023-09-21 DIAGNOSIS — I249 Acute ischemic heart disease, unspecified: Secondary | ICD-10-CM | POA: Diagnosis not present

## 2023-09-21 DIAGNOSIS — I119 Hypertensive heart disease without heart failure: Secondary | ICD-10-CM | POA: Diagnosis not present

## 2023-09-21 DIAGNOSIS — E785 Hyperlipidemia, unspecified: Secondary | ICD-10-CM | POA: Diagnosis not present

## 2023-09-21 DIAGNOSIS — I252 Old myocardial infarction: Secondary | ICD-10-CM | POA: Diagnosis not present

## 2023-09-21 DIAGNOSIS — Z6829 Body mass index (BMI) 29.0-29.9, adult: Secondary | ICD-10-CM | POA: Diagnosis not present

## 2023-09-21 NOTE — Progress Notes (Unsigned)
 Cardiology Office Note:   Date:  09/22/2023  ID:  [name], DOB 04/29/1960, MRN [id number] PCP:  Mariel Shope, DO  [facility] Providers Cardiologist:  Alyssa Backbone, MD Referring MD: Mariel Shope, DO  Chief Complaint/Reason for Referral: Follow-up for acute coronary syndrome ASSESSMENT:    1. Acute coronary syndrome (HCC)   2. Hyperlipidemia LDL goal <55   3. Diastolic dysfunction   4. Primary hypertension   5. BMI 29.0-29.9,adult     PLAN:   In order of problems listed above: Acute coronary syndrome: Continue dual platelet therapy with aspirin  and Plavix  until February 2026 then stop aspirin  entirely and continue Plavix  monotherapy.  Continue rosuvastatin  40 mg.  Has had fatigue with metoprolol ; reduce Toprol  to 12.5 mg daily. Hyperlipidemia: Continue rosuvastatin  40 mg; LDL 49 in April. Diastolic dysfunction: Reduce Toprol  to 12.5 mg, start losartan 12.5 mg daily, consider SGLT2 inhibitor in the future. Hypertension: Reduce Toprol  to 12.5 mg and start losartan 12.5 mg daily. Elevated BMI: Continue Wegovy  2.4 mg q. weekly            Dispo:  Return in about 6 months (around 03/23/2024).      Medication Adjustments/Labs and Tests Ordered: Current medicines are reviewed at length with the patient today.  Concerns regarding medicines are outlined above.  The following changes have been made:     Labs/tests ordered: Orders Placed This Encounter  Procedures   Basic Metabolic Panel (BMET)    Medication Changes: Meds ordered this encounter  Medications   losartan (COZAAR) 25 MG tablet    Sig: Take 0.5 tablets (12.5 mg total) by mouth at bedtime.    Dispense:  45 tablet    Refill:  3   metoprolol  succinate (TOPROL  XL) 25 MG 24 hr tablet    Sig: Take 0.5 tablets (12.5 mg total) by mouth daily.    Dispense:  45 tablet    Refill:  3    Dose decrease    Current medicines are reviewed at length with the patient today.  The patient does not have  concerns regarding medicines.  I spent 35 minutes reviewing all clinical data during and prior to this visit including all relevant imaging studies, laboratories, clinical information from other health systems and prior notes from both Cardiology and other specialties, interviewing the patient, conducting a complete physical examination, and coordinating care in order to formulate a comprehensive and personalized evaluation and treatment plan.   History of Present Illness:    FOCUSED PROBLEM LIST:   ACS February 2025 Distal PDA occlusion >> med Rx EF 55 to 60%; basal inferior hypokinesis, grade 1 diastolic dysfunction, TTE 08/11/2023 Diastolic dysfunction Hypertension Hyperlipidemia LP(a) 46.2 Palpitations Rare PACs, PVCs monitor 2025 Renal cell carcinoma Status post left nephrectomy Status post ablation of right renal tumors Asthma BMI 02 October 2023:  Patient consents to use of AI scribe. The patient returns for routine follow-up.  I first met the patient in February when she presented with an inferior ST elevation myocardial infarction and dynamic ST changes.  Coronary angiography demonstrated distal PDA occlusion.  She was treated medically.  Echocardiogram demonstrated preserved LV function with inferior wall motion abnormality.  She was seen in follow-up in March.  She was doing well at that time.  She did report palpitations and a monitor demonstrated reassuring findings.  She experienced a myocardial infarction in February, managed with medications after a heart catheterization revealed a blockage too small for stenting. An ultrasound showed  good heart function. During a follow-up in March, she was doing well, and a monitor placed for extra heartbeats indicated they were non-dangerous.  She has completed cardiac rehabilitation and generally feels well. However, she has had a few episodes of chest pain described as 'pressure' while sitting and watching TV, lasting about a minute.  She did not take nitroglycerin  as the pain resolved quickly, with the last episode occurring approximately five weeks ago.  Her current medications include Plavix , aspirin , and a cholesterol medication, which she tolerates well without joint or muscle aches. She also takes Toprol  and reports some fatigue, which she associates with this medication. No recent emergency room visits, hospitalizations, lightheadedness, blackouts, or significant bleeding. She notes some bruising, which she attributes to aspirin  and Plavix  use. She monitors her blood pressure at home.  She has noticed some fatigue and wonders whether it could be due to one of her medications.     Current Medications: Current Meds  Medication Sig   albuterol  (PROVENTIL ) (2.5 MG/3ML) 0.083% nebulizer solution Take 3 mLs (2.5 mg total) by nebulization every 6 (six) hours as needed for wheezing or shortness of breath.   albuterol  (VENTOLIN  HFA) 108 (90 Base) MCG/ACT inhaler USE 1 TO 2 INHALATIONS EVERY 6 HOURS AS NEEDED FOR SHORTNESS OF BREATH   ALPRAZolam  (XANAX ) 0.5 MG tablet Take 0.5 mg by mouth at bedtime.   aspirin  81 MG chewable tablet Chew 1 tablet (81 mg total) by mouth daily.   Cholecalciferol (VITAMIN D -3 PO) Take 1 tablet by mouth in the morning.   clopidogrel  (PLAVIX ) 75 MG tablet Take 1 tablet (75 mg total) by mouth daily with breakfast.   FLUoxetine  (PROZAC ) 20 MG capsule Take 20 mg by mouth at bedtime.   fluticasone -salmeterol (WIXELA INHUB) 100-50 MCG/ACT AEPB Inhale 1 puff then rinse mouth, once daily   losartan (COZAAR) 25 MG tablet Take 0.5 tablets (12.5 mg total) by mouth at bedtime.   MAGNESIUM  PO Take 1 tablet by mouth in the morning.   metoprolol  succinate (TOPROL  XL) 25 MG 24 hr tablet Take 0.5 tablets (12.5 mg total) by mouth daily.   nitroGLYCERIN  (NITROSTAT ) 0.4 MG SL tablet Place 1 tablet (0.4 mg total) under the tongue every 5 (five) minutes as needed.   OLANZapine -FLUoxetine  (SYMBYAX ) 3-25 MG capsule Take 1  capsule by mouth at bedtime.   PSYLLIUM HUSK PO Take 2 capsules by mouth in the morning and at bedtime.   rosuvastatin  (CRESTOR ) 40 MG tablet Take 1 tablet (40 mg total) by mouth daily.   WEGOVY  2.4 MG/0.75ML SOAJ Inject 2.4 mg into the skin once a week.   [DISCONTINUED] metoprolol  succinate (TOPROL  XL) 25 MG 24 hr tablet Take 1 tablet (25 mg total) by mouth daily.     Review of Systems:   Please see the history of present illness.    All other systems reviewed and are negative.     EKGs/Labs/Other Test Reviewed:   EKG: February 2025 sinus rhythm, inferior infarction pattern  EKG Interpretation Date/Time:    Ventricular Rate:    PR Interval:    QRS Duration:    QT Interval:    QTC Calculation:   R Axis:      Text Interpretation:           Risk Assessment/Calculations:          Physical Exam:   VS:  BP 114/80   Pulse 60   Ht 5' 1 (1.549 m)   Wt 156 lb (70.8 kg)  LMP 04/03/2013   SpO2 98%   BMI 29.48 kg/m        Wt Readings from Last 3 Encounters:  09/22/23 156 lb (70.8 kg)  09/07/23 155 lb 13.8 oz (70.7 kg)  06/27/23 154 lb 12.8 oz (70.2 kg)      GENERAL:  No apparent distress, AOx3 HEENT:  No carotid bruits, +2 carotid impulses, no scleral icterus CAR: RRR no murmurs, gallops, rubs, or thrills RES:  Clear to auscultation bilaterally ABD:  Soft, nontender, nondistended, positive bowel sounds x 4 VASC:  +2 radial pulses, +2 carotid pulses NEURO:  CN 2-12 grossly intact; motor and sensory grossly intact PSYCH:  No active depression or anxiety EXT:  No edema, ecchymosis, or cyanosis  Signed, Gerrica Cygan K Jakeim Sedore, MD  09/22/2023 9:54 AM    [facility] [address] [place], [place], [place]  [address] Phone: [phone]; Fax: [phone]   Note:  This document was prepared using Dragon voice recognition software and may include unintentional dictation errors.

## 2023-09-21 NOTE — Progress Notes (Signed)
 Discharge Progress Report  Patient Details  Name: [name] MRN: [id number] Date of Birth: 1960/06/28 Referring Provider:   Flowsheet Row INTENSIVE CARDIAC REHAB ORIENT from 06/23/2023 in [facility]  Referring Provider Dr. Lurena Red, MD     Number of Visits: 65  Reason for Discharge:  Patient reached a stable level of exercise. Patient independent in their exercise. Patient has met program and personal goals.  Smoking History:  Social History   Tobacco Use  Smoking Status Never   Passive exposure: Never  Smokeless Tobacco Never    Diagnosis:  05/31/23 ST elevation myocardial infarction (STEMI)  ADL [facility]:   Initial Exercise Prescription:  Initial Exercise Prescription - 06/23/23 1500       Date of Initial Exercise RX and Referring Provider   Date 06/23/23    Referring Provider Dr. Lurena Red, MD    Expected Discharge Date 09/14/23      Recumbant Bike   Level 1    RPM 60    Watts 15    Minutes 15    METs 2      NuStep   Level 1    SPM 80    Minutes 15    METs 1.7      Prescription Details   Frequency (times per week) 3    Duration Progress to 30 minutes of continuous aerobic without signs/symptoms of physical distress      Intensity   THRR 40-80% of Max Heartrate 63-126    Ratings of Perceived Exertion 11-13    Perceived Dyspnea 0-4      Progression   Progression Continue progressive overload as per policy without signs/symptoms or physical distress.      Resistance Training   Training Prescription Yes    Weight 2 lbs wts    Reps 10-15          Discharge Exercise Prescription (Final Exercise Prescription Changes):  Exercise Prescription Changes - 09/14/23 1600       Response to Exercise   Blood Pressure (Admit) 106/62    Blood Pressure (Exit) 102/60    Heart Rate (Admit) 70 bpm    Heart Rate (Exercise) 99 bpm    Heart Rate (Exit) 79 bpm    Rating of Perceived  Exertion (Exercise) 12    Symptoms None    Comments Reviewed METs    Duration Continue with 30 min of aerobic exercise without signs/symptoms of physical distress.    Intensity THRR unchanged      Progression   Progression Continue to progress workloads to maintain intensity without signs/symptoms of physical distress.    Average METs 3      Resistance Training   Training Prescription No    Weight No weights on Wednesdays      Interval Training   Interval Training No      Recumbant Bike   Level 5    RPM 55    Watts 33    Minutes 15    METs 3.2      NuStep   Level 5    SPM 89    Minutes 15    METs 2.4      Home Exercise Plan   Plans to continue exercise at Home (comment)    Frequency Add 2 additional days to program exercise sessions.    Initial Home Exercises Provided 07/27/23          Functional Capacity:  6 Minute Walk  Row Name 06/23/23 1106 09/07/23 1253       6 Minute Walk   Phase Initial Discharge    Distance 1200 feet 1636 feet    Distance % Change -- 36.3 %    Distance Feet Change -- 436 ft    Walk Time 0 minutes 6 minutes    # of Rest Breaks 0 0    MPH 2.3 3.1    METS 2.73 3.65    RPE 11 10    Perceived Dyspnea  0 0    VO2 Peak 9.6 12.8    Symptoms No No    Resting HR 62 bpm 60 bpm    Resting BP 124/80 100/70    Resting Oxygen Saturation  97 % --    Exercise Oxygen Saturation  during 6 min walk 99 % --    Max Ex. HR 81 bpm 99 bpm    Max Ex. BP 128/84 124/50    2 Minute Post BP 120/84 96/60       Psychological, QOL, Others - Outcomes: PHQ 2/9:    09/21/2023    1:52 PM 06/27/2023   10:32 AM 06/23/2023    3:21 PM 11/17/2022   10:15 AM 10/20/2022    8:39 AM  Depression screen PHQ 2/9  Decreased Interest 0 0 0 0 0  Down, Depressed, Hopeless 0 0 1 0 0  PHQ - 2 Score 0 0 1 0 0  Altered sleeping 0 0 0    Tired, decreased energy 0 1 1    Change in appetite 0 0 0    Feeling bad or failure about yourself  0 0 0    Trouble concentrating  0 0 0    Moving slowly or fidgety/restless 0 0 0    Suicidal thoughts 0 0 0    PHQ-9 Score 0 1 2    Difficult doing work/chores Not difficult at all Not difficult at all Not difficult at all      Quality of Life:  Quality of Life - 08/30/23 0747       Quality of Life   Select Quality of Life      Quality of Life Scores   Health/Function Pre 24 %    Health/Function Post 27.2 %    Health/Function % Change 13.33 %    Socioeconomic Pre 30 %    Socioeconomic Post 30 %    Socioeconomic % Change  0 %    Psych/Spiritual Pre 24 %    Psych/Spiritual Post 30 %    Psych/Spiritual % Change 25 %    Family Pre 30 %    Family Post 30 %    Family % Change 0 %    GLOBAL Pre 26 %    GLOBAL Post 28.76 %    GLOBAL % Change 10.62 %          Personal Goals: Goals established at orientation with interventions provided to work toward goal.  Personal Goals and Risk Factors at Admission - 06/23/23 1607       Core Components/Risk Factors/Patient Goals on Admission   Hypertension Yes    Intervention Provide education on lifestyle modifcations including regular physical activity/exercise, weight management, moderate sodium restriction and increased consumption of fresh fruit, vegetables, and low fat dairy, alcohol moderation, and smoking cessation.;Monitor prescription use compliance.    Expected Outcomes Short Term: Continued assessment and intervention until BP is < 140/42mm HG in hypertensive participants. < 130/69mm HG in hypertensive participants with diabetes, heart  failure or chronic kidney disease.;Long Term: Maintenance of blood pressure at goal levels.    Lipids Yes    Intervention Provide education and support for participant on nutrition & aerobic/resistive exercise along with prescribed medications to achieve LDL 70mg , HDL >40mg .    Expected Outcomes Short Term: Participant states understanding of desired cholesterol values and is compliant with medications prescribed. Participant is  following exercise prescription and nutrition guidelines.;Long Term: Cholesterol controlled with medications as prescribed, with individualized exercise RX and with personalized nutrition plan. Value goals: LDL < 70mg , HDL > 40 mg.    Stress Yes    Intervention Offer individual and/or small group education and counseling on adjustment to heart disease, stress management and health-related lifestyle change. Teach and support self-help strategies.;Refer participants experiencing significant psychosocial distress to appropriate mental health specialists for further evaluation and treatment. When possible, include family members and significant others in education/counseling sessions.    Expected Outcomes Short Term: Participant demonstrates changes in health-related behavior, relaxation and other stress management skills, ability to obtain effective social support, and compliance with psychotropic medications if prescribed.;Long Term: Emotional wellbeing is indicated by absence of clinically significant psychosocial distress or social isolation.    Personal Goal Other Yes    Personal Goal Short term: knowledge on heart health, Ex routine that she can stick with Long term: Balance, flexibility and stamina    Intervention Will continue to monitor pt and progress workloads as tolerated without sign or symptom    Expected Outcomes Pt will achieve her goals and gain strength           Personal Goals Discharge:  Goals and Risk Factor Review     Row Name 06/30/23 1039 07/05/23 0931 07/26/23 0810 08/23/23 1542 09/20/23 1722     Core Components/Risk Factors/Patient Goals Review   Personal Goals Review Weight Management/Obesity;Lipids;Stress;Hypertension Weight Management/Obesity;Lipids;Stress;Hypertension Weight Management/Obesity;Lipids;Stress;Hypertension Weight Management/Obesity;Lipids;Stress;Hypertension Weight Management/Obesity;Lipids;Stress;Hypertension   Review [name] started cardiac rehab on  06/29/23. [name] did well with exercise. Vital signs were stable. [name] started cardiac rehab on 06/29/23. [name] is off to a good start  with exercise. Vital signs have been stable. [name] is doing well  with exercise at cardiac rehab. Vital signs have been stable. [name] has been increasing her met level [name] continues to do well  with exercise at cardiac rehab. Vital signs have been stable. [name] has been increasing her met level [name] continues to do well  with exercise at cardiac rehab. Vital signs remain stable.   Expected Outcomes [name] will continue to participate in cardiac rehab for exercise, nutrtion and lifestyle modifications [name] will continue to participate in cardiac rehab for exercise, nutrtion and lifestyle modifications [name] will continue to participate in cardiac rehab for exercise, nutrtion and lifestyle modifications [name] will continue to participate in cardiac rehab for exercise, nutrtion and lifestyle modifications [name] will continue to participate in cardiac rehab for exercise, nutrtion and lifestyle modifications      Exercise Goals and Review:  Exercise Goals     Row Name 06/23/23 1513             Exercise Goals   Increase Physical Activity Yes       Intervention Provide advice, education, support and counseling about physical activity/exercise needs.;Develop an individualized exercise prescription for aerobic and resistive training based on initial evaluation findings, risk stratification, comorbidities and participant's personal goals.       Expected Outcomes Short Term: Attend rehab on a regular basis to increase amount of physical activity.;Long Term: Exercising  regularly at least 3-5 days a week.;Long Term: Add in home exercise to make exercise part of routine and to increase amount of physical activity.       Increase Strength and Stamina Yes       Intervention Provide advice, education, support and counseling about physical activity/exercise  needs.;Develop an individualized exercise prescription for aerobic and resistive training based on initial evaluation findings, risk stratification, comorbidities and participant's personal goals.       Expected Outcomes Short Term: Increase workloads from initial exercise prescription for resistance, speed, and METs.;Short Term: Perform resistance training exercises routinely during rehab and add in resistance training at home;Long Term: Improve cardiorespiratory fitness, muscular endurance and strength as measured by increased METs and functional capacity ( )       Able to understand and use rate of perceived exertion (RPE) scale Yes       Intervention Provide education and explanation on how to use RPE scale       Expected Outcomes Short Term: Able to use RPE daily in rehab to express subjective intensity level;Long Term:  Able to use RPE to guide intensity level when exercising independently       Knowledge and understanding of Target Heart Rate Range (THRR) Yes       Intervention Provide education and explanation of THRR including how the numbers were predicted and where they are located for reference       Expected Outcomes Short Term: Able to state/look up THRR;Long Term: Able to use THRR to govern intensity when exercising independently;Short Term: Able to use daily as guideline for intensity in rehab       Understanding of Exercise Prescription Yes       Intervention Provide education, explanation, and written materials on patient's individual exercise prescription       Expected Outcomes Short Term: Able to explain program exercise prescription;Long Term: Able to explain home exercise prescription to exercise independently          Exercise Goals Re-Evaluation:  Exercise Goals Re-Evaluation     Row Name 06/29/23 1411 07/22/23 1649 08/19/23 1413         Exercise Goal Re-Evaluation   Exercise Goals Review Increase Physical Activity;Increase Strength and Stamina;Able to understand and  use rate of perceived exertion (RPE) scale;Knowledge and understanding of Target Heart Rate Range (THRR);Understanding of Exercise Prescription Increase Physical Activity;Increase Strength and Stamina;Able to understand and use rate of perceived exertion (RPE) scale;Knowledge and understanding of Target Heart Rate Range (THRR);Understanding of Exercise Prescription Increase Physical Activity;Increase Strength and Stamina;Able to understand and use rate of perceived exertion (RPE) scale;Knowledge and understanding of Target Heart Rate Range (THRR);Understanding of Exercise Prescription     Comments Pt's first day in the [facility] program. Pt understands the Exercise Rx, RPE scale and THHR Reviewed METs and goals. Pt voices improvement in her knowlege of heart health though the education classes. Pt likes her exercise routine that she is doing her, which is also a goal. we will discuss home exercise soon. Reviewed METs and Goals. Pt reports that she is learning a lot about her heart, exercise, mental health, and diet in the CR education classes. Pt is performing a similar exericse routine at the gym. She enjoys the NS and is considering buying one for home. Pt reports improvement in flexibility, balance, and stamina.     Expected Outcomes Will continue to monitor patient and progress exercise workloads as tolerated. Will continue to monitor patient and progress exercise workloads as tolerated. Will  continue to monitor patient and progress exercise workloads as tolerated.        Nutrition & Weight - Outcomes:  Pre Biometrics - 06/23/23 1030       Pre Biometrics   Waist Circumference 36.5 inches    Hip Circumference 44 inches    Waist to Hip Ratio 0.83 %    Triceps Skinfold 15 mm    % Body Fat 37.4 %    Grip Strength 23 kg    Flexibility 10 in    Single Leg Stand 11.5 seconds          Post Biometrics - 09/07/23 1304        Post  Biometrics   Height 5' 1 (1.549 m)    Weight 70.7 kg    Waist  Circumference 36.5 inches    Hip Circumference 44 inches    Waist to Hip Ratio 0.83 %    BMI (Calculated) 29.47    Triceps Skinfold 15 mm    % Body Fat 37.2 %    Grip Strength 17 kg    Flexibility 11.9 in    Single Leg Stand 12.56 seconds          Nutrition:  Nutrition Therapy & Goals - 08/26/23 0952       Nutrition Therapy   Diet Heart Healthy Diet    Drug/Food Interactions Statins/Certain Fruits      Personal Nutrition Goals   Nutrition Goal Patient to identify strategies for reducing cardiovascular risk by attending the [facility] education and nutrition series weekly.   goal in action.   Personal Goal #2 Patient to improve diet quality by using the plate method as a guide for meal planning to include lean protein/plant protein, fruits, vegetables, whole grains, nonfat dairy as part of a well-balanced diet.   goal in action.   Comments Goals in action. [name] has medical history of HTN, CAD, STEMI, renal cell carcinmoa s/p partial L nephrectomy. She has been taking wegovy  for weight loss; per documentation, she is down ~45# over the last year (91.4kg on 05/21/2022). LDL has improved to goal of <55 (49). A1c is well controlled in a normal range, previously in a prediabetic range but has improved with weight loss/wegovy . She has maintained her weight since starting with our program. She has attended the [facility] education/nutrition series regularly. Patient will benefit from participation in intensive cardiac rehab for nutrition, exercise, and lifestyle modification.      Intervention Plan   Intervention Prescribe, educate and counsel regarding individualized specific dietary modifications aiming towards targeted core components such as weight, hypertension, lipid management, diabetes, heart failure and other comorbidities.;Nutrition handout(s) given to patient.    Expected Outcomes Short Term Goal: Understand basic principles of dietary content, such as calories, fat, sodium, cholesterol  and nutrients.;Long Term Goal: Adherence to prescribed nutrition plan.          Nutrition Discharge:  Nutrition Assessments - 08/26/23 0945       Rate Your Plate Scores   Pre Score 56    Post Score 84          Education Questionnaire Score:  Knowledge Questionnaire Score - 08/30/23 0745       Knowledge Questionnaire Score   Pre Score 22/24    Post Score 23/24          Goals reviewed with patient; copy given to patient.Pt graduates from  Intensive/Traditional cardiac rehab program on 09/23/23  with completion of  35 exercise and 34 education sessions. Pt  maintained good attendance and progressed nicely during their participation in rehab as evidenced by increased MET level.   Medication list reconciled. Repeat  PHQ score-0  .  Pt has made significant lifestyle changes and should be commended for their success. [name]  achieved their goals during cardiac rehab.   Pt plans to continue exercise at the gym near her home and use weights at home. [name] says she has more confidence now and feels stronger. We are proud of [name]'s progress.Betty Elpidio Quan RN BSN

## 2023-09-22 ENCOUNTER — Encounter (INDEPENDENT_AMBULATORY_CARE_PROVIDER_SITE_OTHER): Admitting: Internal Medicine

## 2023-09-22 ENCOUNTER — Ambulatory Visit: Payer: Medicare Other

## 2023-09-22 ENCOUNTER — Encounter: Payer: Self-pay | Admitting: Internal Medicine

## 2023-09-22 VITALS — BP 114/80 | HR 60 | Ht 61.0 in | Wt 156.0 lb

## 2023-09-22 DIAGNOSIS — Z48812 Encounter for surgical aftercare following surgery on the circulatory system: Secondary | ICD-10-CM | POA: Diagnosis not present

## 2023-09-22 DIAGNOSIS — I249 Acute ischemic heart disease, unspecified: Secondary | ICD-10-CM

## 2023-09-22 DIAGNOSIS — Z6829 Body mass index (BMI) 29.0-29.9, adult: Secondary | ICD-10-CM | POA: Diagnosis not present

## 2023-09-22 DIAGNOSIS — I252 Old myocardial infarction: Secondary | ICD-10-CM | POA: Diagnosis not present

## 2023-09-22 DIAGNOSIS — I10 Essential (primary) hypertension: Secondary | ICD-10-CM

## 2023-09-22 DIAGNOSIS — E785 Hyperlipidemia, unspecified: Secondary | ICD-10-CM

## 2023-09-22 DIAGNOSIS — I5189 Other ill-defined heart diseases: Secondary | ICD-10-CM

## 2023-09-22 DIAGNOSIS — I119 Hypertensive heart disease without heart failure: Secondary | ICD-10-CM | POA: Diagnosis not present

## 2023-09-22 MED ORDER — LOSARTAN POTASSIUM 25 MG PO TABS: 12.5000 mg | ORAL_TABLET | Freq: Every day | ORAL | 3 refills | Status: DC

## 2023-09-22 MED ORDER — METOPROLOL SUCCINATE ER 25 MG PO TB24
12.5000 mg | ORAL_TABLET | Freq: Every day | ORAL | 3 refills | Status: AC
Start: 2023-09-22 — End: ?

## 2023-09-22 NOTE — Patient Instructions (Signed)
 Medication Instructions:  Your physician has recommended you make the following change in your medication:  1.) decrease Toprol  XL to HALF tablet (12.5 mg) - once daily 2.) start losartan 25 mg - take HALF tablet (12.5 mg) daily at bedtime  *If you need a refill on your cardiac medications before your next appointment, please call your pharmacy*  Lab Work: First floor lab in 7-10 days (bmet)  If you have labs (blood work) drawn today and your tests are completely normal, you will receive your results only by: [employer] (if you have MyChart) OR A paper copy in the mail If you have any lab test that is abnormal or we need to change your treatment, we will call you to review the results.  Testing/Procedures: none  Follow-Up: At [facility], you and your health needs are our priority.  As part of our continuing mission to provide you with exceptional heart care, our providers are all part of one team.  This team includes your primary Cardiologist (physician) and Advanced Practice Providers or APPs (Physician Assistants and Nurse Practitioners) who all work together to provide you with the care you need, when you need it.  Your next appointment:   6 month(s)  Provider:   Lovette Rud, PA-C

## 2023-09-23 ENCOUNTER — Encounter (HOSPITAL_COMMUNITY)
Admission: RE | Admit: 2023-09-23 | Discharge: 2023-09-23 | Disposition: A | Discharge status: Home / Self Care | Source: Ambulatory Visit | Attending: Internal Medicine | Admitting: Internal Medicine

## 2023-09-23 DIAGNOSIS — Z6829 Body mass index (BMI) 29.0-29.9, adult: Secondary | ICD-10-CM | POA: Diagnosis not present

## 2023-09-23 DIAGNOSIS — I249 Acute ischemic heart disease, unspecified: Secondary | ICD-10-CM | POA: Diagnosis not present

## 2023-09-23 DIAGNOSIS — I213 ST elevation (STEMI) myocardial infarction of unspecified site: Secondary | ICD-10-CM

## 2023-09-23 DIAGNOSIS — Z48812 Encounter for surgical aftercare following surgery on the circulatory system: Secondary | ICD-10-CM | POA: Diagnosis not present

## 2023-09-23 DIAGNOSIS — E785 Hyperlipidemia, unspecified: Secondary | ICD-10-CM | POA: Diagnosis not present

## 2023-09-23 DIAGNOSIS — I119 Hypertensive heart disease without heart failure: Secondary | ICD-10-CM | POA: Diagnosis not present

## 2023-09-23 DIAGNOSIS — I252 Old myocardial infarction: Secondary | ICD-10-CM | POA: Diagnosis not present

## 2023-10-11 ENCOUNTER — Ambulatory Visit
Admission: RE | Admit: 2023-10-11 | Discharge: 2023-10-11 | Disposition: A | Discharge status: Home / Self Care | Source: Ambulatory Visit | Attending: Family Medicine | Admitting: Family Medicine

## 2023-10-11 DIAGNOSIS — N6310 Unspecified lump in the right breast, unspecified quadrant: Secondary | ICD-10-CM

## 2023-10-11 DIAGNOSIS — N6313 Unspecified lump in the right breast, lower outer quadrant: Secondary | ICD-10-CM | POA: Diagnosis not present

## 2023-10-12 ENCOUNTER — Other Ambulatory Visit: Payer: Self-pay | Admitting: Family Medicine

## 2023-10-12 ENCOUNTER — Encounter: Payer: Self-pay | Admitting: Physician Assistant

## 2023-10-12 DIAGNOSIS — Z1231 Encounter for screening mammogram for malignant neoplasm of breast: Secondary | ICD-10-CM

## 2023-10-21 DIAGNOSIS — I10 Essential (primary) hypertension: Secondary | ICD-10-CM | POA: Diagnosis not present

## 2023-10-21 DIAGNOSIS — I5189 Other ill-defined heart diseases: Secondary | ICD-10-CM | POA: Diagnosis not present

## 2023-10-21 LAB — BASIC METABOLIC PANEL WITH GFR
BUN/Creatinine Ratio: 16 (ref 12–28)
BUN: 12 mg/dL (ref 8–27)
CO2: 23 mmol/L (ref 20–29)
Calcium: 10.2 mg/dL (ref 8.7–10.3)
Chloride: 102 mmol/L (ref 96–106)
Creatinine, Ser: 0.75 mg/dL (ref 0.57–1.00)
Glucose: 90 mg/dL (ref 70–99)
Potassium: 4.4 mmol/L (ref 3.5–5.2)
Sodium: 137 mmol/L (ref 134–144)
eGFR: 89 mL/min/1.73 (ref >59)

## 2023-10-22 ENCOUNTER — Ambulatory Visit: Payer: Self-pay | Admitting: Internal Medicine

## 2023-11-04 ENCOUNTER — Other Ambulatory Visit: Payer: Self-pay | Admitting: Internal Medicine

## 2023-11-04 NOTE — Telephone Encounter (Signed)
 OK refill albuterol  HFA.  Last OV 05/16/2023.  Per chart note: Follow-up in 1 year or sooner if control worsens.

## 2023-11-07 ENCOUNTER — Ambulatory Visit: Admitting: Internal Medicine

## 2023-11-07 DIAGNOSIS — Z85528 Personal history of other malignant neoplasm of kidney: Secondary | ICD-10-CM | POA: Diagnosis not present

## 2023-11-07 DIAGNOSIS — Z124 Encounter for screening for malignant neoplasm of cervix: Secondary | ICD-10-CM | POA: Diagnosis not present

## 2023-11-07 DIAGNOSIS — Z1151 Encounter for screening for human papillomavirus (HPV): Secondary | ICD-10-CM | POA: Diagnosis not present

## 2023-11-07 DIAGNOSIS — Z01419 Encounter for gynecological examination (general) (routine) without abnormal findings: Secondary | ICD-10-CM | POA: Diagnosis not present

## 2023-11-07 DIAGNOSIS — I2510 Atherosclerotic heart disease of native coronary artery without angina pectoris: Secondary | ICD-10-CM | POA: Diagnosis not present

## 2023-12-07 ENCOUNTER — Other Ambulatory Visit (HOSPITAL_COMMUNITY): Payer: Self-pay

## 2023-12-08 ENCOUNTER — Encounter: Payer: Self-pay | Admitting: Family Medicine

## 2023-12-12 ENCOUNTER — Telehealth: Payer: Self-pay

## 2023-12-12 ENCOUNTER — Other Ambulatory Visit (HOSPITAL_COMMUNITY): Payer: Self-pay

## 2023-12-12 NOTE — Telephone Encounter (Deleted)
 Pharmacy Patient Advocate Encounter   Received notification from Patient Advice Request messages that prior authorization for Wegovy  2.4MG /0.75ML auto-injectors  is required/requested.   Insurance verification completed.   The patient is insured through [employer] .   Per test claim: PA required and submitted KEY/EOC/Request #: [id number] due to: Drug is not covered by plan

## 2023-12-12 NOTE — Telephone Encounter (Signed)
 No further action needed.

## 2023-12-12 NOTE — Telephone Encounter (Signed)
 Pharmacy Patient Advocate Encounter   Received notification from Patient Advice Request messages that prior authorization for Wegovy  2.4MG /0.75ML auto-injectors  is required/requested.   Insurance verification completed.   The patient is insured through [employer] .   Per test claim: PA required and submitted KEY/EOC/Request #: [id number] due to: Drug is not covered by plan

## 2023-12-13 ENCOUNTER — Other Ambulatory Visit (HOSPITAL_COMMUNITY): Payer: Self-pay

## 2023-12-20 ENCOUNTER — Telehealth: Payer: Self-pay

## 2023-12-20 ENCOUNTER — Encounter: Payer: Self-pay | Admitting: Family Medicine

## 2023-12-20 MED ORDER — WEGOVY 2.4 MG/0.75ML ~~LOC~~ SOAJ: 2.4000 mg | SUBCUTANEOUS | 1 refills | Status: DC

## 2023-12-20 NOTE — Telephone Encounter (Signed)
 3 month scripts are approved.  May send with 1 refills, and refills will be provided at her follow-up appointment for her chronic conditions already scheduled for later this month.

## 2023-12-20 NOTE — Telephone Encounter (Signed)
 Pt stating per insurance a new PA is needed for WEGOVY  2.4MG /0.75ML. please advise if PA is needed.

## 2023-12-20 NOTE — Addendum Note (Signed)
 Addended by: CLAUDENE SHANDA ORN on: 12/20/2023 03:43 PM   Modules accepted: Orders

## 2023-12-20 NOTE — Telephone Encounter (Signed)
 Wegovy  is no longer covered by patient's insurance. Please see telephone encounter on 12/12/23. Also, per patient message on 12/08/23, patient received notice from her insurance that it is not covered.

## 2023-12-22 NOTE — Telephone Encounter (Signed)
 Wegovy  is not covered. Insurance automatically cancelled the PA even with heart disease diagnosis.

## 2023-12-22 NOTE — Telephone Encounter (Signed)
 Please see message from 09/16      Joen Ardean Sar to [facility] Clinical (supporting Charlies DELENA Bellini, DO)     12/20/23  3:18 PM Could you please make sure the prior authorization team knows I have heart disease and my A1C was elevated. Thanks and sorry for the back and forth. Joen Sar

## 2023-12-22 NOTE — Telephone Encounter (Signed)
 noted

## 2023-12-28 ENCOUNTER — Ambulatory Visit: Admitting: Family Medicine

## 2023-12-28 ENCOUNTER — Ambulatory Visit: Payer: Self-pay | Admitting: Family Medicine

## 2023-12-28 ENCOUNTER — Encounter: Payer: Self-pay | Admitting: Family Medicine

## 2023-12-28 VITALS — BP 123/78 | HR 62 | Temp 98.3°F | Wt 168.8 lb

## 2023-12-28 DIAGNOSIS — I252 Old myocardial infarction: Secondary | ICD-10-CM

## 2023-12-28 DIAGNOSIS — Z85528 Personal history of other malignant neoplasm of kidney: Secondary | ICD-10-CM | POA: Diagnosis not present

## 2023-12-28 DIAGNOSIS — I700 Atherosclerosis of aorta: Secondary | ICD-10-CM

## 2023-12-28 DIAGNOSIS — I25110 Atherosclerotic heart disease of native coronary artery with unstable angina pectoris: Secondary | ICD-10-CM

## 2023-12-28 DIAGNOSIS — K760 Fatty (change of) liver, not elsewhere classified: Secondary | ICD-10-CM

## 2023-12-28 DIAGNOSIS — Z23 Encounter for immunization: Secondary | ICD-10-CM

## 2023-12-28 DIAGNOSIS — E559 Vitamin D deficiency, unspecified: Secondary | ICD-10-CM | POA: Diagnosis not present

## 2023-12-28 DIAGNOSIS — I10 Essential (primary) hypertension: Secondary | ICD-10-CM

## 2023-12-28 DIAGNOSIS — M85851 Other specified disorders of bone density and structure, right thigh: Secondary | ICD-10-CM

## 2023-12-28 DIAGNOSIS — R7309 Other abnormal glucose: Secondary | ICD-10-CM | POA: Diagnosis not present

## 2023-12-28 LAB — VITAMIN D 25 HYDROXY (VIT D DEFICIENCY, FRACTURES): VITD: 33.51 ng/mL (ref 30.00–100.00)

## 2023-12-28 LAB — HEMOGLOBIN A1C: Hgb A1c MFr Bld: 6 % (ref 4.6–6.5)

## 2023-12-28 LAB — TSH: TSH: 1.3 u[IU]/mL (ref 0.35–5.50)

## 2023-12-28 MED ORDER — WEGOVY 2.4 MG/0.75ML ~~LOC~~ SOAJ: 2.4000 mg | SUBCUTANEOUS | 1 refills | Status: DC

## 2023-12-28 NOTE — Patient Instructions (Addendum)
 Return in about 1 year (around 12/28/2024) for Routine chronic condition follow-up R/W/B.        Great to see you today.  I have refilled the medication(s) we provide.   If labs were collected or images ordered, we will inform you of  results once we have received them and reviewed. We will contact you either by echart message, or telephone call.  Please give ample time to the testing facility, and our office to run,  receive and review results. Please do not call inquiring of results, even if you can see them in your chart. We will contact you as soon as we are able. If it has been over 1 week since the test was completed, and you have not yet heard from us , then please call us .    - echart message- for normal results that have been seen by the patient already.   - telephone call: abnormal results or if patient has not viewed results in their echart.  If a referral to a specialist was entered for you, please call us  in 2 weeks if you have not heard from the specialist office to schedule.

## 2023-12-28 NOTE — Progress Notes (Signed)
 Patient ID: [name], female  DOB: [birth year], 63 y.o.   MRN: [id number] Patient Care Team    Relationship Specialty Notifications Start End  [name], DO PCP - General Family Medicine  03/05/15   Saturnino Marzetta Blumenthal, MD Referring Physician Urology  04/08/17   Missie Balling, MD Referring Physician Gastroenterology  04/08/17   Neysa Reggy BIRCH, MD Consulting Physician Pulmonary Disease  04/08/17   Gorge Ade, MD Consulting Physician Obstetrics and Gynecology  06/05/21   Thukkani, Arun K, MD Consulting Physician Cardiology  06/27/23     Chief Complaint  Patient presents with   Hypertension    Flu & prevnar given.     Subjective: [name] is a 63 y.o.  Female  present for [facility] follow up Hypertension/hyperlipidemia/morbid obesity- bmi >30/hypo/NSTEMI Pt reports compliance with losartan  12.5 mg every day, plavix /asa, crestor  40 NSTEMI 05/2023 w/ cath- no stent Patient denies chest pain, shortness of breath, dizziness or lower extremity edema.   Pt does not take a  daily baby ASA. Pt is not prescribed statin. She did start red yeast rice and psyllium supplements. Diet: Cutting out soda, sugar Exercise: routinely.  RF: HTN, HLD, obesity, FHx HD, history of MI x 2 Cath: 05/31/2023     RPDA lesion is 100% stenosed.   1.  No high-grade proximal occlusions with possible vessel cutoff of distal PDA. 2.  Highly elevated LVEDP of 32 mmHg with preserved LVEF with mid inferior wall motion abnormality.   Recommendation: Patient was administered 10 mg of IV Lasix  given that she is Lasix  nave.  Will follow troponins.  If positive will treat with dual antiplatelet therapy with Plavix  for 1 year given distal vessel cutoff/embolization.  Monitoring the to see unit.  The case was reviewed with Dr. Zenaida and Anner.     H/O RCC:  Pt has a h/o RCC left kidney with partial nephrectomy and RCC right kidney with cryoablation.  Since her last visit she has undergone  some more changes with her renal cell carcinoma. She is following with specialties routinely every 6 months for now.   E66.9> now overweight: Weight (lbs): 201> 191>195> 184>172>154> wegovy  no longer covered 4 weeks> 168 BMI 38.05> 36.2>36.84> 34.84> 32.5>29.25> '>31.89 Exercise: working out at home on recombinant bike and walking. Did not like the gym Water  consumption:getting  80 ounces, also drinking sugar free electrolyte drink.  Diet: lean meat and veggies, berries. > feels the diet is becoming easier to manage Was  wegovy  2.4 mg       12/28/2023    9:58 AM 09/21/2023    1:52 PM 06/27/2023   10:32 AM 06/23/2023    3:21 PM 11/17/2022   10:15 AM  Depression screen PHQ 2/9  Decreased Interest 0 0 0 0 0  Down, Depressed, Hopeless 0 0 0 1 0  PHQ - 2 Score 0 0 0 1 0  Altered sleeping 0 0 0 0   Tired, decreased energy 0 0 1 1   Change in appetite 0 0 0 0   Feeling bad or failure about yourself  0 0 0 0   Trouble concentrating 0 0 0 0   Moving slowly or fidgety/restless 0 0 0 0   Suicidal thoughts 0 0 0 0   PHQ-9 Score 0 0 1 2   Difficult doing work/chores Not difficult at all Not difficult at all Not difficult at all Not difficult at all       06/27/2023  10:33 AM 10/20/2022    8:39 AM 05/21/2022    8:33 AM 11/20/2021   10:53 AM  GAD 7 : Generalized Anxiety Score  Nervous, Anxious, on Edge 1 0 1 1  Control/stop worrying 1 0 3 1  Worry too much - different things 1 0 3 1  Trouble relaxing 1 0 3 0  Restless 0 0 0 0  Easily annoyed or irritable 0 0 0 0  Afraid - awful might happen 1 0 1 0  Total GAD 7 Score 5 0 11 3  Anxiety Difficulty Not difficult at all Not difficult at all Somewhat difficult     Immunization History  Administered Date(s) Administered   Influenza Split 01/24/2012, 01/06/2013, 01/03/2014, 12/16/2014, 01/15/2015   Influenza, Seasonal, Injecte, Preservative Fre 12/28/2022, 12/28/2023   Influenza,inj,Quad PF,6+ Mos 12/16/2014, 01/06/2016, 11/25/2016,  12/15/2017, 12/15/2018, 01/07/2020, 12/15/2020, 11/20/2021   Influenza-Unspecified 12/16/2014, 01/06/2016, 11/25/2016, 12/15/2017, 12/15/2018, 01/07/2020   PFIZER(Purple Top)SARS-COV-2 Vaccination 06/21/2019, 07/12/2019, 01/15/2020, 08/26/2020, 12/30/2020   PNEUMOCOCCAL CONJUGATE-20 12/28/2023   Pneumococcal Conjugate-13 03/24/2015   Pneumococcal Polysaccharide-23 12/17/2013   Td 09/03/2020   Tdap 03/24/2015, 09/24/2020     Past Medical History:  Diagnosis Date   Anxiety    psychiatrist: Dr. Kirt    Arthritis    Asthma    Bronchitis    Colitis    Colon polyps    COVID-19 09/2020   Depression    psychiatrist: Dr. Kirt   Elevated liver enzymes    Dr. Davina following   Essential hypertension, benign 03/05/2015   Fatty liver    GERD (gastroesophageal reflux disease)    Heart murmur    History of chicken pox    History of kidney stones    Hypertension    Multiple pulmonary nodules determined by computed tomography of lung 03/07/2014   Last CT suggested clearance of tree-in-bud.  Follows with Dr. Reggy     Chest CT [facility] 2015-micronodules, groundglass, tree in bud right upper lobe  CT chest 03/15/16 No evidence of interstitial lung disease or mycobacterium aviumcomplex. Scattered pulmonary nodules. No f/u needed- low risk   Osteoarthritis of right knee 07/29/2021   Pericardial effusion 06/01/2023   Postmenopausal bleeding 06/27/2020   Primary osteoarthritis of both knees 04/09/2011   Formatting of this note might be different from the original. Overview:  S/p left total knee replacement   Renal cell carcinoma    hx of left and right; Dr. Jonathan   Trochanteric bursitis, left hip 09/27/2018   No Known Allergies  Past Surgical History:  Procedure Laterality Date   CHOLECYSTECTOMY     FETAL RADIO FREQUENCY ABLATION     FRACTURE SURGERY Left    ankle   JOINT REPLACEMENT Left 2009   knee   LEFT HEART CATH AND CORONARY ANGIOGRAPHY N/A 05/31/2023   Procedure: LEFT HEART  CATH AND CORONARY ANGIOGRAPHY;  Surgeon: Wendel Lurena POUR, MD;  Location: [facility];  Service: Cardiovascular;  Laterality: N/A;   NEPHRECTOMY     partial LT   OPEN SURGICAL REPAIR OF GLUTEAL TENDON Left 09/27/2018   Procedure: Left hip bursectomy; gluteal tendon repair;  Surgeon: Melodi Lerner, MD;  Location: [facility];  Service: Orthopedics;  Laterality: Left;    TONSILLECTOMY     TOTAL KNEE ARTHROPLASTY Right 12/21/2021   Procedure: TOTAL KNEE ARTHROPLASTY;  Surgeon: Melodi Lerner, MD;  Location: [facility];  Service: Orthopedics;  Laterality: Right;   Family History  Problem Relation Age of Onset   Hypertension Mother  Alzheimer's disease Mother    Arthritis Mother    Hypertension Father    Alzheimer's disease Father    COPD Father    Heart disease Father    Arthritis Father    Hypertension Sister    Social History   Social History Narrative   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   Lives with her husband, son and granddaughter.   Drinks caffeinated beverages.   Wears her seatbelt, wears a bicycle helmet, there is a smoke detector in home. There are no firearms in the home.   Patient feels safe in her relationships.    Allergies as of 12/28/2023   No Known Allergies      Medication List        Accurate as of December 28, 2023 10:46 AM. If you have any questions, ask your nurse or doctor.          STOP taking these medications    MAGNESIUM  PO Stopped by: Charlies Bellini       TAKE these medications    albuterol  (2.5 MG/3ML) 0.083% nebulizer solution Commonly known as: PROVENTIL  Take 3 mLs (2.5 mg total) by nebulization every 6 (six) hours as needed for wheezing or shortness of breath.   albuterol  108 (90 Base) MCG/ACT inhaler Commonly known as: VENTOLIN  HFA USE 1 TO 2 INHALATIONS EVERY 6 HOURS AS NEEDED FOR SHORTNESS OF BREATH   ALPRAZolam  0.5 MG tablet Commonly known as: XANAX  Take 0.5 mg by  mouth at bedtime.   amphetamine-dextroamphetamine 20 MG 24 hr capsule Commonly known as: ADDERALL XR Take 20 mg by mouth every morning.   amphetamine-dextroamphetamine 15 MG tablet Commonly known as: ADDERALL Take 1 tablet by mouth daily.   aspirin  81 MG chewable tablet Chew 1 tablet (81 mg total) by mouth daily.   clopidogrel  75 MG tablet Commonly known as: PLAVIX  Take 1 tablet (75 mg total) by mouth daily with breakfast.   FLUoxetine  20 MG capsule Commonly known as: PROZAC  Take 20 mg by mouth at bedtime.   fluticasone -salmeterol 100-50 MCG/ACT Aepb Commonly known as: Wixela Inhub Inhale 1 puff then rinse mouth, once daily   losartan  25 MG tablet Commonly known as: COZAAR  Take 0.5 tablets (12.5 mg total) by mouth at bedtime.   metoprolol  succinate 25 MG 24 hr tablet Commonly known as: Toprol  XL Take 0.5 tablets (12.5 mg total) by mouth daily.   nitroGLYCERIN  0.4 MG SL tablet Commonly known as: NITROSTAT  Place 1 tablet (0.4 mg total) under the tongue every 5 (five) minutes as needed.   OLANZapine -FLUoxetine  3-25 MG capsule Commonly known as: SYMBYAX  Take 1 capsule by mouth at bedtime.   PSYLLIUM HUSK PO Take 2 capsules by mouth in the morning and at bedtime.   rosuvastatin  40 MG tablet Commonly known as: CRESTOR  Take 1 tablet (40 mg total) by mouth daily.   VITAMIN D -3 PO Take 1 tablet by mouth in the morning.   Wegovy  2.4 MG/0.75ML Soaj SQ injection Generic drug: semaglutide -weight management Inject 2.4 mg into the skin once a week.        All past medical history, surgical history, allergies, family history, immunizations andmedications were updated in the EMR today and reviewed under the history and medication portions of their EMR.      ROS: 14 pt review of systems performed and negative (unless mentioned in an HPI)  Objective: BP 123/78   Pulse 62   Temp 98.3 F (36.8 C)   Wt  168 lb 12.8 oz (76.6 kg)   LMP 04/03/2013   SpO2 99%   BMI 31.89  kg/m  Physical Exam Vitals and nursing note reviewed.  Constitutional:      General: She is not in acute distress.    Appearance: Normal appearance. She is not ill-appearing, toxic-appearing or diaphoretic.  HENT:     Head: Normocephalic and atraumatic.  Eyes:     General: No scleral icterus.       Right eye: No discharge.        Left eye: No discharge.     Extraocular Movements: Extraocular movements intact.     Conjunctiva/sclera: Conjunctivae normal.     Pupils: Pupils are equal, round, and reactive to light.  Cardiovascular:     Rate and Rhythm: Normal rate and regular rhythm.     Heart sounds: No murmur heard. Pulmonary:     Effort: Pulmonary effort is normal. No respiratory distress.     Breath sounds: Normal breath sounds. No wheezing, rhonchi or rales.  Musculoskeletal:     Cervical back: Neck supple. No tenderness.     Right lower leg: No edema.     Left lower leg: No edema.  Lymphadenopathy:     Cervical: No cervical adenopathy.  Skin:    General: Skin is warm and dry.     Coloration: Skin is not jaundiced or pale.     Findings: No erythema or rash.  Neurological:     Mental Status: She is alert and oriented to person, place, and time. Mental status is at baseline.     Motor: No weakness.     Gait: Gait normal.  Psychiatric:        Mood and Affect: Mood normal.        Behavior: Behavior normal.        Thought Content: Thought content normal.        Judgment: Judgment normal.     No results found for this or any previous visit (from the past 48 hours).    Assessment/plan: [name] is a 63 y.o. female present for [facility] Anxiety and depression Follows w/ psych  Hyperlipidemia, unspecified hyperlipidemia type/Morbid obesity (HCC)/hypokalemia/NSTEMI/aortic atherosclerosis/CAD/hepatic steatosis Stable Continue losartan  12.5 mg daily-cardiology Continue crestor , metoprolol , plavix , asa  - per cardio - Goal LDL 100-130.  She will think about statin  use if not at goal.  We also discussed Zetia as a potential.  Lengthy discussion today surrounding her concern of statins and her renal function. -For now continue red yeast rice and psyllium supplements.   Labs CBC, CMP and lipid results reviewed in EMR. TSH collected today  Vitamin D  deficiency/osteopneia - vit d collected today -DEXA completed 09/03/2021-ordered today T-score -2.0 Major fracture risk: 28.4% Hip fracture risk: 2.1 Pt dcd fosamax  35 weekly> severe GERD  Influenza vaccine needed - Flu vaccine trivalent PF, 6mos and older(Flulaval,Afluria,Fluarix,Fluzone) Need for vaccination for pneumococcus Administered today  Elevated hemoglobin A1c (Primary) - Hemoglo A1c  Morbid obesity (HCC)/Elevated hemoglobin A1c/Weight loss counseling, encounter for/NSTEMI/hepatic steatosis, aortic atherosclerosis/hypertension/hyperlipidemia Patient was counseled on exercise, calorie counting, weight loss and potential medications to help with weight loss today. -Patient was provided with online resources for: Weekly net calorie calculator.  Applications for calorie counting.  Patient was advised to ensure she is taking in adequate nutrition daily by meeting calorie goals. -Patient was educated on dietary changes to not only lose weight but to eat healthy.  Patient was educated on glycemic index. -Patient was educated on exercise  goal of 150 minutes a week (plus warm up and cool down) of cardiovascular exercise.  Patient was educated on heart rate for cardiovascular and fat burning zones. -Patient was encouraged to maintain adequate water  consumption of at least 80 ounces a day, more if exercising/sweating. She has been tolerating Wegovy  2.4  mg.  Up until last month when insurance no longer covered medication for her-despite her high cardiac risk with her history of MI. I21.19: E66.01, R73.09, E78.5: K76.0, I25.110, I70.0 -added diagnosis codes to prescription.  Patient certainly meets criteria for  this medication.  She is aware if Wegovy  is not covered, Zepbound probably would not be covered either going to try if Wegovy  denied.  Return in about 1 year (around 12/28/2024) for Routine chronic condition follow-up R/W/B.   Orders Placed This Encounter  Procedures   DG Bone Density   Flu vaccine trivalent PF, 6mos and older(Flulaval,Afluria,Fluarix,Fluzone)   Pneumococcal conjugate vaccine 20-valent   TSH   Vitamin D  (25 hydroxy)   Hemoglobin A1c    Meds ordered this encounter  Medications   WEGOVY  2.4 MG/0.75ML SOAJ SQ injection    Sig: Inject 2.4 mg into the skin once a week.    Dispense:  9 mL    Refill:  1    Referral Orders  No referral(s) requested today     Electronically signed by: Charlies Bellini, DO [facility]

## 2024-01-23 ENCOUNTER — Encounter: Payer: Self-pay | Admitting: Family Medicine

## 2024-01-24 NOTE — Telephone Encounter (Signed)
 Please assist with PA.

## 2024-01-25 ENCOUNTER — Other Ambulatory Visit (HOSPITAL_COMMUNITY): Payer: Self-pay

## 2024-01-25 ENCOUNTER — Telehealth: Payer: Self-pay

## 2024-01-25 NOTE — Telephone Encounter (Signed)
 Pharmacy Patient Advocate Encounter   Received notification from CoverMyMeds that prior authorization for Wegovy  1.7MG /0.75ML auto-injectors  is required/requested.   Insurance verification completed.   The patient is insured through [employer].   Per test claim: PA required; PA submitted to above mentioned insurance via Latent Key/confirmation #/EOC [facility] Status is pending

## 2024-01-26 NOTE — Telephone Encounter (Signed)
 Pharmacy Patient Advocate Encounter  Received notification from [employer] that Prior Authorization for Wegovy  1.7MG /0.75ML auto-injectors has been CLOSED.   PA #/Case ID/Reference #: [id number]

## 2024-01-26 NOTE — Telephone Encounter (Signed)
 This denial letter states Wegovy  1.7 mg autoinjector was denied.  Patient is prescribed Wegovy  2.4 mg?  Can we contact pharmacy technician to ensure they are requesting prior authorization on it Wegovy  2.4 mg injection.

## 2024-01-26 NOTE — Telephone Encounter (Signed)
 PA request has been CLOSED. New Encounter has been or will be created for follow up. For additional info see Pharmacy Prior Auth telephone encounter from 01/24/2024.

## 2024-01-31 NOTE — Addendum Note (Signed)
 Addended by: Dellia Donnelly A on: 01/31/2024 11:48 AM   Modules accepted: Orders

## 2024-01-31 NOTE — Telephone Encounter (Signed)
 Please make sure patient is aware of denial

## 2024-02-15 ENCOUNTER — Ambulatory Visit

## 2024-03-03 ENCOUNTER — Encounter: Payer: Self-pay | Admitting: Family Medicine

## 2024-03-05 NOTE — Telephone Encounter (Signed)
 Please call patient and set her up for a visit to discuss restarting Wegovy .  This can be virtual.  Please inform her there are options to receive this medication that may be cheaper for her, but we will need to discuss.

## 2024-03-07 ENCOUNTER — Ambulatory Visit: Admitting: Family Medicine

## 2024-03-07 DIAGNOSIS — I700 Atherosclerosis of aorta: Secondary | ICD-10-CM | POA: Diagnosis not present

## 2024-03-07 DIAGNOSIS — I252 Old myocardial infarction: Secondary | ICD-10-CM

## 2024-03-07 DIAGNOSIS — R7309 Other abnormal glucose: Secondary | ICD-10-CM | POA: Diagnosis not present

## 2024-03-07 DIAGNOSIS — E782 Mixed hyperlipidemia: Secondary | ICD-10-CM | POA: Diagnosis not present

## 2024-03-07 MED ORDER — ZEPBOUND 5 MG/0.5ML ~~LOC~~ SOLN: 5.0000 mg | SUBCUTANEOUS | 1 refills | Status: AC

## 2024-03-07 MED ORDER — ZEPBOUND 2.5 MG/0.5ML ~~LOC~~ SOLN: 2.5000 mg | SUBCUTANEOUS | 0 refills | Status: DC

## 2024-03-07 NOTE — Patient Instructions (Signed)

## 2024-03-07 NOTE — Progress Notes (Unsigned)
 [name] , Mar 20, 1961, 63 y.o., female MRN: [id number] Patient Care Team    Relationship Specialty Notifications Start End  [name], DO PCP - General Family Medicine  03/05/15   Saturnino Marzetta Blumenthal, MD Referring Physician Urology  04/08/17   Missie Balling, MD Referring Physician Gastroenterology  04/08/17   Neysa Reggy BIRCH, MD Consulting Physician Pulmonary Disease  04/08/17   Gorge Ade, MD Consulting Physician Obstetrics and Gynecology  06/05/21   Thukkani, Arun K, MD Consulting Physician Cardiology  06/27/23     Chief Complaint  Patient presents with   Weight Management Screening    Pt wanting to discuss restarting [employer] .      Subjective: [name] is a 63 y.o. Pt presents for an OV with complaints of *** of *** duration.  Associated symptoms include ***.  Pt has tried *** to ease their symptoms.   180 152 was lowest     03/07/2024   11:19 AM 12/28/2023    9:58 AM 09/21/2023    1:52 PM 06/27/2023   10:32 AM 06/23/2023    3:21 PM  Depression screen PHQ 2/9  Decreased Interest 0 0 0 0 0  Down, Depressed, Hopeless 0 0 0 0 1  PHQ - 2 Score 0 0 0 0 1  Altered sleeping 0 0 0 0 0  Tired, decreased energy 1 0 0 1 1  Change in appetite 1 0 0 0 0  Feeling bad or failure about yourself  0 0 0 0 0  Trouble concentrating 0 0 0 0 0  Moving slowly or fidgety/restless 0 0 0 0 0  Suicidal thoughts 0 0 0 0 0  PHQ-9 Score 2 0  0  1  2   Difficult doing work/chores Not difficult at all Not difficult at all Not difficult at all Not difficult at all Not difficult at all     Data saved with a previous flowsheet row definition    No Known Allergies Social History   Social History Narrative   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   Lives with her husband, son and granddaughter.   Drinks caffeinated beverages.   Wears her seatbelt, wears a bicycle helmet, there is a smoke detector in home. There are  no firearms in the home.   Patient feels safe in her relationships.   Past Medical History:  Diagnosis Date   Anxiety    psychiatrist: Dr. Kirt    Arthritis    Asthma    Bronchitis    Colitis    Colon polyps    COVID-19 09/2020   Depression    psychiatrist: Dr. Kirt   Elevated liver enzymes    Dr. Davina following   Essential hypertension, benign 03/05/2015   Fatty liver    GERD (gastroesophageal reflux disease)    Heart murmur    History of chicken pox    History of kidney stones    Hypertension    Multiple pulmonary nodules determined by computed tomography of lung 03/07/2014   Last CT suggested clearance of tree-in-bud.  Follows with Dr. Reggy     Chest CT [facility] 2015-micronodules, groundglass, tree in bud right upper lobe  CT chest 03/15/16 No evidence of interstitial lung disease or mycobacterium aviumcomplex. Scattered pulmonary nodules. No f/u needed- low risk   Osteoarthritis of right knee 07/29/2021   Pericardial effusion 06/01/2023   Postmenopausal bleeding 06/27/2020   Primary  osteoarthritis of both knees 04/09/2011   Formatting of this note might be different from the original. Overview:  S/p left total knee replacement   Renal cell carcinoma    hx of left and right; Dr. Jonathan   Trochanteric bursitis, left hip 09/27/2018   Past Surgical History:  Procedure Laterality Date   CHOLECYSTECTOMY     FETAL RADIO FREQUENCY ABLATION     FRACTURE SURGERY Left    ankle   JOINT REPLACEMENT Left 2009   knee   LEFT HEART CATH AND CORONARY ANGIOGRAPHY N/A 05/31/2023   Procedure: LEFT HEART CATH AND CORONARY ANGIOGRAPHY;  Surgeon: Wendel Lurena POUR, MD;  Location: [facility];  Service: Cardiovascular;  Laterality: N/A;   NEPHRECTOMY     partial LT   OPEN SURGICAL REPAIR OF GLUTEAL TENDON Left 09/27/2018   Procedure: Left hip bursectomy; gluteal tendon repair;  Surgeon: Melodi Lerner, MD;  Location: [facility];  Service: Orthopedics;  Laterality: Left;     TONSILLECTOMY     TOTAL KNEE ARTHROPLASTY Right 12/21/2021   Procedure: TOTAL KNEE ARTHROPLASTY;  Surgeon: Melodi Lerner, MD;  Location: [facility];  Service: Orthopedics;  Laterality: Right;   Family History  Problem Relation Age of Onset   Hypertension Mother    Alzheimer's disease Mother    Arthritis Mother    Hypertension Father    Alzheimer's disease Father    COPD Father    Heart disease Father    Arthritis Father    Hypertension Sister    Allergies as of 03/07/2024   No Known Allergies      Medication List        Accurate as of March 07, 2024 12:52 PM. If you have any questions, ask your nurse or doctor.          albuterol  (2.5 MG/3ML) 0.083% nebulizer solution Commonly known as: PROVENTIL  Take 3 mLs (2.5 mg total) by nebulization every 6 (six) hours as needed for wheezing or shortness of breath.   albuterol  108 (90 Base) MCG/ACT inhaler Commonly known as: VENTOLIN  HFA USE 1 TO 2 INHALATIONS EVERY 6 HOURS AS NEEDED FOR SHORTNESS OF BREATH   ALPRAZolam  0.5 MG tablet Commonly known as: XANAX  Take 0.5 mg by mouth at bedtime.   amphetamine-dextroamphetamine 20 MG 24 hr capsule Commonly known as: ADDERALL XR Take 20 mg by mouth every morning.   amphetamine-dextroamphetamine 15 MG tablet Commonly known as: ADDERALL Take 1 tablet by mouth daily.   aspirin  81 MG chewable tablet Chew 1 tablet (81 mg total) by mouth daily.   clopidogrel  75 MG tablet Commonly known as: PLAVIX  Take 1 tablet (75 mg total) by mouth daily with breakfast.   FLUoxetine  20 MG capsule Commonly known as: PROZAC  Take 20 mg by mouth at bedtime.   fluticasone -salmeterol 100-50 MCG/ACT Aepb Commonly known as: Wixela Inhub Inhale 1 puff then rinse mouth, once daily   losartan  25 MG tablet Commonly known as: COZAAR  Take 0.5 tablets (12.5 mg total) by mouth at bedtime.   metoprolol  succinate 25 MG 24 hr tablet Commonly known as: Toprol  XL Take 0.5 tablets (12.5 mg total) by mouth  daily.   nitroGLYCERIN  0.4 MG SL tablet Commonly known as: NITROSTAT  Place 1 tablet (0.4 mg total) under the tongue every 5 (five) minutes as needed.   OLANZapine -FLUoxetine  3-25 MG capsule Commonly known as: SYMBYAX  Take 1 capsule by mouth at bedtime.   PSYLLIUM HUSK PO Take 2 capsules by mouth in the morning and at bedtime.  rosuvastatin  40 MG tablet Commonly known as: CRESTOR  Take 1 tablet (40 mg total) by mouth daily.   VITAMIN D -3 PO Take 1 tablet by mouth in the morning.   Zepbound  2.5 MG/0.5ML injection vial Generic drug: tirzepatide  Inject 2.5 mg into the skin once a week. Started by: Laurianne Floresca   Zepbound  5 MG/0.5ML injection vial Generic drug: tirzepatide  Inject 5 mg into the skin once a week. Start taking on: March 28, 2024 Started by: Charlies Bellini        All past medical history, surgical history, allergies, family history, immunizations andmedications were updated in the EMR today and reviewed under the history and medication portions of their EMR.     ROS Negative, with the exception of above mentioned in HPI   Objective:  BP 120/74   Pulse 68   Temp 98 F (36.7 C)   Wt 180 lb 12.8 oz (82 kg)   LMP 04/03/2013   SpO2 98%   BMI 34.16 kg/m  Body mass index is 34.16 kg/m. Physical Exam Vitals and nursing note reviewed.  Constitutional:      General: She is not in acute distress.    Appearance: Normal appearance. She is normal weight. She is not ill-appearing or toxic-appearing.  HENT:     Head: Normocephalic and atraumatic.  Eyes:     General: No scleral icterus.       Right eye: No discharge.        Left eye: No discharge.     Extraocular Movements: Extraocular movements intact.     Conjunctiva/sclera: Conjunctivae normal.     Pupils: Pupils are equal, round, and reactive to light.  Skin:    Findings: No rash.  Neurological:     Mental Status: She is alert and oriented to person, place, and time. Mental status is at baseline.      Motor: No weakness.     Coordination: Coordination normal.     Gait: Gait normal.  Psychiatric:        Mood and Affect: Mood normal.        Behavior: Behavior normal.        Thought Content: Thought content normal.        Judgment: Judgment normal.     No results found. No results found. No results found for this or any previous visit (from the past 24 hours).  Assessment/Plan: [name] is a 63 y.o. female present for OV for  *** Reviewed expectations re: course of current medical issues. Discussed self-management of symptoms. Outlined signs and symptoms indicating need for more acute intervention. Patient verbalized understanding and all questions were answered. Patient received an After-Visit Summary.    No orders of the defined types were placed in this encounter.  Meds ordered this encounter  Medications   tirzepatide  (ZEPBOUND ) 2.5 MG/0.5ML injection vial    Sig: Inject 2.5 mg into the skin once a week.    Dispense:  2 mL    Refill:  0   tirzepatide  (ZEPBOUND ) 5 MG/0.5ML injection vial    Sig: Inject 5 mg into the skin once a week.    Dispense:  2 mL    Refill:  1   Referral Orders  No referral(s) requested today     Note is dictated utilizing voice recognition software. Although note has been proof read prior to signing, occasional typographical errors still can be missed. If any questions arise, please do not hesitate to call for verification.   electronically signed by:  Charlies Bellini, DO  [facility] - OR

## 2024-03-18 NOTE — Progress Notes (Unsigned)
 Cardiology Office Note:   Date:  03/22/2024  ID:  [name], DOB 1960/12/03, MRN [id number] PCP:  [name] LABOR, DO  [facility] Providers Cardiologist:  [name], MD Referring MD: [name] LABOR, DO  Chief Complaint/Reason for Referral: Follow-up for acute coronary syndrome ASSESSMENT:    1. Acute coronary syndrome (HCC)   2. Hyperlipidemia LDL goal <55   3. Diastolic dysfunction   4. Primary hypertension   5. BMI 29.0-29.9,adult      PLAN:   In order of problems listed above: Acute coronary syndrome: Continue dual platelet therapy with aspirin  and Plavix  until February 2026 then stop aspirin  entirely and continue Plavix  monotherapy.  Continue rosuvastatin  40 mg.  Continue Toprol  12.5 mg due to diastolic dysfunction.  Continue as needed nitroglycerin  Hyperlipidemia: Continue rosuvastatin  40 mg; check lipid panel and LFTs today.  If at goal of less than 55 will check hs-CRP and if elevated will consider low-dose colchicine to mitigate ongoing cardiovascular inflammatory risk Diastolic dysfunction: Continue Toprol  12.5 mg, losartan  12.5 mg daily, start Jardiance  10 mg daily  Hypertension: Continue Toprol  12.5 mg and losartan  12.5 mg daily.  Blood pressure is well-controlled today. Prediabetes: Start Jardiance  10 mg. Elevated BMI: Continue Zepbound  5 mg q. weekly            Dispo:  Return in about 6 months (around 09/20/2024).      Medication Adjustments/Labs and Tests Ordered: Current medicines are reviewed at length with the patient today.  Concerns regarding medicines are outlined above.  The following changes have been made:     Labs/tests ordered: Orders Placed This Encounter  Procedures   Lipid panel   Hepatic function panel    Medication Changes: Meds ordered this encounter  Medications   DISCONTD: empagliflozin  (JARDIANCE ) 10 MG TABS tablet    Sig: Take 1 tablet (10 mg total) by mouth daily before breakfast.    Dispense:  30 tablet     Refill:  3   empagliflozin  (JARDIANCE ) 10 MG TABS tablet    Sig: Take 1 tablet (10 mg total) by mouth daily before breakfast.    Dispense:  30 tablet    Refill:  3   clopidogrel  (PLAVIX ) 75 MG tablet    Sig: Take 1 tablet (75 mg total) by mouth daily with breakfast.    Dispense:  90 tablet    Refill:  3   losartan  (COZAAR ) 25 MG tablet    Sig: Take 0.5 tablets (12.5 mg total) by mouth at bedtime.    Dispense:  45 tablet    Refill:  3   metoprolol  succinate (TOPROL  XL) 25 MG 24 hr tablet    Sig: Take 0.5 tablets (12.5 mg total) by mouth daily.    Dispense:  45 tablet    Refill:  3    Dose decrease   rosuvastatin  (CRESTOR ) 40 MG tablet    Sig: Take 1 tablet (40 mg total) by mouth daily.    Dispense:  90 tablet    Refill:  3    Current medicines are reviewed at length with the patient today.  The patient does not have concerns regarding medicines.  I spent  minutes reviewing all clinical data during and prior to this visit including all relevant imaging studies, laboratories, clinical information from other health systems and prior notes from both Cardiology and other specialties, interviewing the patient, conducting a complete physical examination, and coordinating care in order to formulate a comprehensive and personalized evaluation and treatment plan.  History of Present Illness:    FOCUSED PROBLEM LIST:   ACS February 2025 Distal PDA occlusion >> med Rx EF 55 to 60%; basal inferior hypokinesis, grade 1 diastolic dysfunction, TTE 08/11/2023 Diastolic dysfunction Hypertension Hyperlipidemia LP(a) 46.2 Palpitations Rare PACs, PVCs monitor 2025 Renal cell carcinoma Status post left nephrectomy Status post ablation of right renal tumors Asthma Prediabetes HbA1C 6.0 2025 BMI 02 October 2023:  Patient consents to use of AI scribe. The patient returns for routine follow-up.  I first met the patient in February when she presented with an inferior ST elevation myocardial  infarction and dynamic ST changes.  Coronary angiography demonstrated distal PDA occlusion.  She was treated medically.  Echocardiogram demonstrated preserved LV function with inferior wall motion abnormality.  She was seen in follow-up in March.  She was doing well at that time.  She did report palpitations and a monitor demonstrated reassuring findings.  She experienced a myocardial infarction in February, managed with medications after a heart catheterization revealed a blockage too small for stenting. An ultrasound showed good heart function. During a follow-up in March, she was doing well, and a monitor placed for extra heartbeats indicated they were non-dangerous.  She has completed cardiac rehabilitation and generally feels well. However, she has had a few episodes of chest pain described as 'pressure' while sitting and watching TV, lasting about a minute. She did not take nitroglycerin  as the pain resolved quickly, with the last episode occurring approximately five weeks ago.  Her current medications include Plavix , aspirin , and a cholesterol medication, which she tolerates well without joint or muscle aches. She also takes Toprol  and reports some fatigue, which she associates with this medication. No recent emergency room visits, hospitalizations, lightheadedness, blackouts, or significant bleeding. She notes some bruising, which she attributes to aspirin  and Plavix  use. She monitors her blood pressure at home.  She has noticed some fatigue and wonders whether it could be due to one of her medications.  Plan: Reduce Toprol  to 12.5 mg, start losartan  12.5 mg.  December 2025:  Patient consents to use of AI scribe. She was seen by her PCP recently.  Her blood pressure is well-controlled.  She was not complaining of any cardiovascular issues.  She was started on Zepbound  2.5 mg a week.  She has resumed taking Zepbound  after a three-month hiatus due to insurance issues. Initially, she lost over  sixty pounds on [place] but began regaining weight after stopping the medication. She is now self-paying for Zepbound  at a cost of approximately $300 per month. She has been back on the medication for a couple of weeks and notes that it typically takes a few months to see significant weight loss.  She experiences occasional 'weird feelings' in her chest, which she attributes to anxiety, especially when sitting and thinking. No chest discomfort during physical activity such as walking.  Her hemoglobin A1c was checked in September and was consistent with prediabetes. She is currently on losartan  for her heart condition. She is taking aspirin  and Plavix . She inquires about the use of ibuprofen, which she understands can be taken occasionally but not regularly due to potential gastrointestinal side effects. No history of frequent urinary tract infections.     Current Medications: Current Meds  Medication Sig   albuterol  (PROVENTIL ) (2.5 MG/3ML) 0.083% nebulizer solution Take 3 mLs (2.5 mg total) by nebulization every 6 (six) hours as needed for wheezing or shortness of breath.   albuterol  (VENTOLIN  HFA) 108 (90 Base) MCG/ACT inhaler  USE 1 TO 2 INHALATIONS EVERY 6 HOURS AS NEEDED FOR SHORTNESS OF BREATH   ALPRAZolam  (XANAX ) 0.5 MG tablet Take 0.5 mg by mouth at bedtime.   amphetamine-dextroamphetamine (ADDERALL XR) 20 MG 24 hr capsule Take 20 mg by mouth every morning.   amphetamine-dextroamphetamine (ADDERALL) 15 MG tablet Take 1 tablet by mouth daily.   aspirin  81 MG chewable tablet Chew 1 tablet (81 mg total) by mouth daily.   Cholecalciferol (VITAMIN D -3 PO) Take 1 tablet by mouth in the morning.   FLUoxetine  (PROZAC ) 20 MG capsule Take 20 mg by mouth at bedtime.   fluticasone -salmeterol (WIXELA INHUB) 100-50 MCG/ACT AEPB Inhale 1 puff then rinse mouth, once daily   nitroGLYCERIN  (NITROSTAT ) 0.4 MG SL tablet Place 1 tablet (0.4 mg total) under the tongue every 5 (five) minutes as needed.    OLANZapine -FLUoxetine  (SYMBYAX ) 3-25 MG capsule Take 1 capsule by mouth at bedtime.   PSYLLIUM HUSK PO Take 2 capsules by mouth in the morning and at bedtime.   tirzepatide  (ZEPBOUND ) 2.5 MG/0.5ML injection vial Inject 2.5 mg into the skin once a week.   [START ON 03/28/2024] tirzepatide  (ZEPBOUND ) 5 MG/0.5ML injection vial Inject 5 mg into the skin once a week.   [DISCONTINUED] clopidogrel  (PLAVIX ) 75 MG tablet Take 1 tablet (75 mg total) by mouth daily with breakfast.   [DISCONTINUED] empagliflozin  (JARDIANCE ) 10 MG TABS tablet Take 1 tablet (10 mg total) by mouth daily before breakfast.   [DISCONTINUED] losartan  (COZAAR ) 25 MG tablet Take 0.5 tablets (12.5 mg total) by mouth at bedtime.   [DISCONTINUED] metoprolol  succinate (TOPROL  XL) 25 MG 24 hr tablet Take 0.5 tablets (12.5 mg total) by mouth daily.   [DISCONTINUED] rosuvastatin  (CRESTOR ) 40 MG tablet Take 1 tablet (40 mg total) by mouth daily.     Review of Systems:   Please see the history of present illness.    All other systems reviewed and are negative.     EKGs/Labs/Other Test Reviewed:   EKG: February 2025 sinus rhythm, inferior infarction pattern  EKG Interpretation Date/Time:    Ventricular Rate:    PR Interval:    QRS Duration:    QT Interval:    QTC Calculation:   R Axis:      Text Interpretation:           Risk Assessment/Calculations:          Physical Exam:   VS:  BP 128/78   Pulse 69   Ht 5' 1 (1.549 m)   Wt 180 lb 9.6 oz (81.9 kg)   LMP 04/03/2013   SpO2 99%   BMI 34.12 kg/m        Wt Readings from Last 3 Encounters:  03/22/24 180 lb 9.6 oz (81.9 kg)  03/07/24 180 lb 12.8 oz (82 kg)  12/28/23 168 lb 12.8 oz (76.6 kg)      GENERAL:  No apparent distress, AOx3 HEENT:  No carotid bruits, +2 carotid impulses, no scleral icterus CAR: RRR no murmurs, gallops, rubs, or thrills RES:  Clear to auscultation bilaterally ABD:  Soft, nontender, nondistended, positive bowel sounds x 4 VASC:  +2  radial pulses, +2 carotid pulses NEURO:  CN 2-12 grossly intact; motor and sensory grossly intact PSYCH:  No active depression or anxiety EXT:  No edema, ecchymosis, or cyanosis  Signed, Dimarco Minkin K Darbi Chandran, MD  03/22/2024 10:20 AM    [facility] [address] [place], [place], [place]  [address] Phone: [phone]; Fax: [phone]  Note:  This document was prepared using Dragon voice recognition software and may include unintentional dictation errors.

## 2024-03-22 ENCOUNTER — Ambulatory Visit: Admitting: Internal Medicine

## 2024-03-22 ENCOUNTER — Encounter: Payer: Self-pay | Admitting: Internal Medicine

## 2024-03-22 VITALS — BP 128/78 | HR 69 | Ht 61.0 in | Wt 180.6 lb

## 2024-03-22 DIAGNOSIS — I249 Acute ischemic heart disease, unspecified: Secondary | ICD-10-CM | POA: Diagnosis present

## 2024-03-22 DIAGNOSIS — I5189 Other ill-defined heart diseases: Secondary | ICD-10-CM | POA: Diagnosis present

## 2024-03-22 DIAGNOSIS — I10 Essential (primary) hypertension: Secondary | ICD-10-CM | POA: Insufficient documentation

## 2024-03-22 DIAGNOSIS — Z6829 Body mass index (BMI) 29.0-29.9, adult: Secondary | ICD-10-CM | POA: Insufficient documentation

## 2024-03-22 DIAGNOSIS — E785 Hyperlipidemia, unspecified: Secondary | ICD-10-CM | POA: Insufficient documentation

## 2024-03-22 LAB — HEPATIC FUNCTION PANEL
ALT: 66 IU/L — ABNORMAL HIGH (ref 0–32)
AST: 43 IU/L — ABNORMAL HIGH (ref 0–40)
Albumin: 4 g/dL (ref 3.9–4.9)
Alkaline Phosphatase: 129 IU/L (ref 49–135)
Bilirubin Total: 0.5 mg/dL (ref 0.0–1.2)
Bilirubin, Direct: 0.1 mg/dL (ref 0.00–0.40)
Total Protein: 7.9 g/dL (ref 6.0–8.5)

## 2024-03-22 LAB — LIPID PANEL
Chol/HDL Ratio: 1.9 ratio (ref 0.0–4.4)
Cholesterol, Total: 125 mg/dL (ref 100–199)
HDL: 65 mg/dL (ref >39)
LDL Chol Calc (NIH): 46 mg/dL (ref 0–99)
Triglycerides: 69 mg/dL (ref 0–149)
VLDL Cholesterol Cal: 14 mg/dL (ref 5–40)

## 2024-03-22 MED ORDER — ROSUVASTATIN CALCIUM 40 MG PO TABS: 40.0000 mg | ORAL_TABLET | Freq: Every day | ORAL | 3 refills | Status: AC

## 2024-03-22 MED ORDER — METOPROLOL SUCCINATE ER 25 MG PO TB24: 12.5000 mg | ORAL_TABLET | Freq: Every day | ORAL | 3 refills | Status: AC

## 2024-03-22 MED ORDER — LOSARTAN POTASSIUM 25 MG PO TABS: 12.5000 mg | ORAL_TABLET | Freq: Every day | ORAL | 3 refills | Status: AC

## 2024-03-22 MED ORDER — EMPAGLIFLOZIN 10 MG PO TABS: 10.0000 mg | ORAL_TABLET | Freq: Every day | ORAL | 3 refills | Status: DC

## 2024-03-22 MED ORDER — CLOPIDOGREL BISULFATE 75 MG PO TABS: 75.0000 mg | ORAL_TABLET | Freq: Every day | ORAL | 3 refills | Status: AC

## 2024-03-22 NOTE — Patient Instructions (Signed)
 Medication Instructions:  STOP Aspirin  in FEBRUARY   START Jardiance  10 mg once daily   *If you need a refill on your cardiac medications before your next appointment, please call your pharmacy*  Lab Work: To be completed today: lipid panel and LFT  If you have labs (blood work) drawn today and your tests are completely normal, you will receive your results only by: MyChart Message (if you have MyChart) OR A paper copy in the mail If you have any lab test that is abnormal or we need to change your treatment, we will call you to review the results.  Testing/Procedures: None ordered today.  Follow-Up: At [facility], you and your health needs are our priority.  As part of our continuing mission to provide you with exceptional heart care, our providers are all part of one team.  This team includes your primary Cardiologist (physician) and Advanced Practice Providers or APPs (Physician Assistants and Nurse Practitioners) who all work together to provide you with the care you need, when you need it.  Your next appointment:   6 month(s)  Provider:   One of our Advanced Practice Providers (APPs): Morse Clause, PA-C  Lamarr Satterfield, NP Miriam Shams, NP  Olivia Pavy, PA-C Josefa Beauvais, NP  Leontine Salen, PA-C Orren Fabry, PA-C  [place], PA-C Ernest Dick, NP  Damien Braver, NP Jon Hails, PA-C  Waddell Donath, PA-C    Dayna Dunn, PA-C  Scott Weaver, PA-C Lum Louis, NP Katlyn West, NP Callie Goodrich, PA-C  Xika Zhao, NP Sheng Haley, PA-C    Kathleen Johnson, PA-C

## 2024-03-23 ENCOUNTER — Ambulatory Visit: Payer: Self-pay | Admitting: Internal Medicine

## 2024-03-26 ENCOUNTER — Encounter: Payer: Self-pay | Admitting: Internal Medicine

## 2024-04-04 ENCOUNTER — Ambulatory Visit

## 2024-04-16 ENCOUNTER — Ambulatory Visit: Payer: Self-pay

## 2024-04-16 NOTE — Telephone Encounter (Signed)
" °  FYI Only or Action Required?: FYI only for provider: appointment scheduled on 1/14.  Patient was last seen in primary care on 03/07/2024 by Catherine Fuller A, DO.  Called Nurse Triage reporting Otalgia.  Symptoms began 3 days ago.  Interventions attempted: OTC medications: allergy  pills.  Symptoms are: gradually worsening.  Triage Disposition: See Physician Within 24 Hours  Patient/caregiver understands and will follow disposition?: Yes    Copied from CRM #[id number]. Topic: Clinical - Red Word Triage >> Apr 16, 2024  1:07 PM Alfonso ORN wrote: Red Word that prompted transfer to Nurse Triage: ear ache, teeth aching on same side, possible infection Reason for Disposition  Earache  (Exceptions: Brief ear pain of lasting less than 60 minutes, or earache occurring during air travel.)  Answer Assessment - Initial Assessment Questions 1. LOCATION: Which ear is involved?     Rt ear 2. ONSET: When did the ear pain start?      Few days  3. SEVERITY: How bad is the pain?  (Scale 1-10; mild, moderate or severe)     moderate 4. URI SYMPTOMS: Do you have a runny nose or cough?     Nasal congestion 5. FEVER: Do you have a fever? If Yes, ask: What is your temperature, how was it measured, and when did it start?     no 6. CAUSE: Have you been swimming recently?, How often do you use Q-TIPS?, Have you had any recent air travel or scuba diving?      7. OTHER SYMPTOMS: Do you have any other symptoms? (e.g., decreased hearing, dizziness, headache, stiff neck, vomiting)     Ha, teeth pain on rt side  Protocols used: Earache-A-AH  "

## 2024-04-18 ENCOUNTER — Ambulatory Visit: Admitting: Family Medicine

## 2024-04-27 ENCOUNTER — Ambulatory Visit: Admitting: Family Medicine

## 2024-05-07 ENCOUNTER — Ambulatory Visit: Admitting: Family Medicine

## 2024-05-09 ENCOUNTER — Other Ambulatory Visit: Payer: Self-pay | Admitting: Family Medicine

## 2024-05-10 NOTE — Telephone Encounter (Signed)
 Pt has appt tomorrow, 2/6

## 2024-05-11 ENCOUNTER — Encounter: Payer: Self-pay | Admitting: Family Medicine

## 2024-05-11 ENCOUNTER — Ambulatory Visit: Admitting: Family Medicine

## 2024-05-11 MED ORDER — ZEPBOUND 7.5 MG/0.5ML ~~LOC~~ SOLN: 7.5000 mg | SUBCUTANEOUS | 0 refills | Status: AC

## 2024-05-11 MED ORDER — ZEPBOUND 10 MG/0.5ML ~~LOC~~ SOLN: 10.0000 mg | SUBCUTANEOUS | 0 refills | Status: AC

## 2024-05-11 NOTE — Progress Notes (Unsigned)
 "      [name] , 09-29-1960, 64 y.o., female MRN: [id number] Patient Care Team    Relationship Specialty Notifications Start End  [name], DO PCP - General Family Medicine  03/05/15   Saturnino Marzetta Blumenthal, MD Referring Physician Urology  04/08/17   Missie Balling, MD Referring Physician Gastroenterology  04/08/17   Neysa Reggy BIRCH, MD Consulting Physician Pulmonary Disease  04/08/17   Gorge Ade, MD (Inactive) Consulting Physician Obstetrics and Gynecology  06/05/21   Wendel Lurena POUR, MD Consulting Physician Cardiology  06/27/23     Chief Complaint  Patient presents with   Obesity     Subjective: [name] is a 64 y.o. Pt presents for an OV to discuss weight loss counseling.  Patient had been on Wegovy  and was able to tolerate it to a dose of 2.4 mg weekly.  She was able to lose weight, and was able to get down to 152 pounds.  Unfortunately, insurance would not continue medication for patient and she has gained the weight back in healthy is 188.5 pounds with a BMI of 34.16. She does admit she has an increased appetite and is not following a good diet currently.  She is also not exercising as much. She would like to restart a GLP-1 medication to help her with weight loss.  She denies any history of medullary thyroid  cancer in herself or her family, no family history of neuroendocrine tumors.   180.6>176.2 34.12>33.29     05/11/2024   10:26 AM 03/07/2024   11:19 AM 12/28/2023    9:58 AM 09/21/2023    1:52 PM 06/27/2023   10:32 AM  Depression screen PHQ 2/9  Decreased Interest 0 0 0 0 0  Down, Depressed, Hopeless 0 0 0 0 0  PHQ - 2 Score 0 0 0 0 0  Altered sleeping 0 0 0 0 0  Tired, decreased energy 0 1 0 0 1  Change in appetite 0 1 0 0 0  Feeling bad or failure about yourself  0 0 0 0 0  Trouble concentrating 0 0 0 0 0  Moving slowly or fidgety/restless 0 0 0 0 0  Suicidal thoughts 0 0 0 0 0  PHQ-9 Score 0 2 0  0  1   Difficult doing  work/chores Not difficult at all Not difficult at all Not difficult at all Not difficult at all Not difficult at all     Data saved with a previous flowsheet row definition    No Known Allergies Social History   Social History Narrative   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   Lives with her husband, son and granddaughter.   Drinks caffeinated beverages.   Wears her seatbelt, wears a bicycle helmet, there is a smoke detector in home. There are no firearms in the home.   Patient feels safe in her relationships.   Past Medical History:  Diagnosis Date   Anxiety    psychiatrist: Dr. Kirt    Arthritis    Asthma    Bronchitis    Colitis    Colon polyps    COVID-19 09/2020   Depression    psychiatrist: Dr. Kirt   Elevated liver enzymes    Dr. Davina following   Essential hypertension, benign 03/05/2015   Fatty liver    GERD (gastroesophageal reflux disease)    Heart murmur    History of chicken pox    History  of kidney stones    Hypertension    Multiple pulmonary nodules determined by computed tomography of lung 03/07/2014   Last CT suggested clearance of tree-in-bud.  Follows with Dr. Reggy     Chest CT [facility] 2015-micronodules, groundglass, tree in bud right upper lobe  CT chest 03/15/16 No evidence of interstitial lung disease or mycobacterium aviumcomplex. Scattered pulmonary nodules. No f/u needed- low risk   Osteoarthritis of right knee 07/29/2021   Pericardial effusion 06/01/2023   Postmenopausal bleeding 06/27/2020   Primary osteoarthritis of both knees 04/09/2011   Formatting of this note might be different from the original. Overview:  S/p left total knee replacement   Renal cell carcinoma    hx of left and right; Dr. Jonathan   Trochanteric bursitis, left hip 09/27/2018   Past Surgical History:  Procedure Laterality Date   CHOLECYSTECTOMY     FETAL RADIO FREQUENCY ABLATION     FRACTURE SURGERY Left    ankle    JOINT REPLACEMENT Left 2009   knee   LEFT HEART CATH AND CORONARY ANGIOGRAPHY N/A 05/31/2023   Procedure: LEFT HEART CATH AND CORONARY ANGIOGRAPHY;  Surgeon: Wendel Lurena POUR, MD;  Location: [facility];  Service: Cardiovascular;  Laterality: N/A;   NEPHRECTOMY     partial LT   OPEN SURGICAL REPAIR OF GLUTEAL TENDON Left 09/27/2018   Procedure: Left hip bursectomy; gluteal tendon repair;  Surgeon: Melodi Lerner, MD;  Location: [facility];  Service: Orthopedics;  Laterality: Left;    TONSILLECTOMY     TOTAL KNEE ARTHROPLASTY Right 12/21/2021   Procedure: TOTAL KNEE ARTHROPLASTY;  Surgeon: Melodi Lerner, MD;  Location: [facility];  Service: Orthopedics;  Laterality: Right;   Family History  Problem Relation Age of Onset   Hypertension Mother    Alzheimer's disease Mother    Arthritis Mother    Hypertension Father    Alzheimer's disease Father    COPD Father    Heart disease Father    Arthritis Father    Hypertension Sister    Allergies as of 05/11/2024   No Known Allergies      Medication List        Accurate as of May 11, 2024 10:41 AM. If you have any questions, ask your nurse or doctor.          albuterol  (2.5 MG/3ML) 0.083% nebulizer solution Commonly known as: PROVENTIL  Take 3 mLs (2.5 mg total) by nebulization every 6 (six) hours as needed for wheezing or shortness of breath.   albuterol  108 (90 Base) MCG/ACT inhaler Commonly known as: VENTOLIN  HFA USE 1 TO 2 INHALATIONS EVERY 6 HOURS AS NEEDED FOR SHORTNESS OF BREATH   ALPRAZolam  0.5 MG tablet Commonly known as: XANAX  Take 0.5 mg by mouth at bedtime.   amphetamine-dextroamphetamine 20 MG 24 hr capsule Commonly known as: ADDERALL XR Take 20 mg by mouth every morning.   amphetamine-dextroamphetamine 15 MG tablet Commonly known as: ADDERALL Take 1 tablet by mouth daily.   aspirin  81 MG chewable tablet Chew 1 tablet (81 mg total) by mouth daily.   clopidogrel  75 MG tablet Commonly known as:  PLAVIX  Take 1 tablet (75 mg total) by mouth daily with breakfast.   empagliflozin  10 MG Tabs tablet Commonly known as: JARDIANCE  Take 1 tablet (10 mg total) by mouth daily before breakfast.   FLUoxetine  20 MG capsule Commonly known as: PROZAC  Take 20 mg by mouth at bedtime.   fluticasone -salmeterol 100-50 MCG/ACT Aepb Commonly known as: Wixela Inhub Inhale  1 puff then rinse mouth, once daily   losartan  25 MG tablet Commonly known as: COZAAR  Take 0.5 tablets (12.5 mg total) by mouth at bedtime.   metoprolol  succinate 25 MG 24 hr tablet Commonly known as: Toprol  XL Take 0.5 tablets (12.5 mg total) by mouth daily.   nitroGLYCERIN  0.4 MG SL tablet Commonly known as: NITROSTAT  Place 1 tablet (0.4 mg total) under the tongue every 5 (five) minutes as needed.   OLANZapine -FLUoxetine  3-25 MG capsule Commonly known as: SYMBYAX  Take 1 capsule by mouth at bedtime.   PSYLLIUM HUSK PO Take 2 capsules by mouth in the morning and at bedtime.   rosuvastatin  40 MG tablet Commonly known as: CRESTOR  Take 1 tablet (40 mg total) by mouth daily.   VITAMIN D -3 PO Take 1 tablet by mouth in the morning.   Zepbound  2.5 MG/0.5ML injection vial Generic drug: tirzepatide  Inject 2.5 mg into the skin once a week.   Zepbound  5 MG/0.5ML injection vial Generic drug: tirzepatide  Inject 5 mg into the skin once a week.        All past medical history, surgical history, allergies, family history, immunizations andmedications were updated in the EMR today and reviewed under the history and medication portions of their EMR.     ROS Negative, with the exception of above mentioned in HPI   Objective:  BP 110/62   Pulse 84   Temp 98.7 F (37.1 C)   Wt 176 lb 3.2 oz (79.9 kg)   LMP 04/03/2013   SpO2 98%   BMI 33.29 kg/m  Body mass index is 33.29 kg/m. Physical Exam Vitals and nursing note reviewed.  Constitutional:      General: She is not in acute distress.    Appearance: Normal  appearance. She is normal weight. She is not ill-appearing or toxic-appearing.  HENT:     Head: Normocephalic and atraumatic.  Eyes:     General: No scleral icterus.       Right eye: No discharge.        Left eye: No discharge.     Extraocular Movements: Extraocular movements intact.     Conjunctiva/sclera: Conjunctivae normal.     Pupils: Pupils are equal, round, and reactive to light.  Skin:    Findings: No rash.  Neurological:     Mental Status: She is alert and oriented to person, place, and time. Mental status is at baseline.     Motor: No weakness.     Coordination: Coordination normal.     Gait: Gait normal.  Psychiatric:        Mood and Affect: Mood normal.        Behavior: Behavior normal.        Thought Content: Thought content normal.        Judgment: Judgment normal.     No results found. No results found. No results found for this or any previous visit (from the past 24 hours).  Assessment/Plan: [name] is a 64 y.o. female present for OV for  Morbid obesity (HCC) (Primary)/Mixed hyperlipidemia/Elevated hemoglobin A1c/Aortic atherosclerosis/History of non-ST elevation myocardial infarction (NSTEMI) Patient denies any history of medullary thyroid  cancer in herself or her family, no family history of neuroendocrine tumors. - Patient was counseled on exercise, calorie counting, weight loss and potential medications to help with weight loss today. -Patient was provided with online resources for: Weekly net calorie calculator.  Applications for calorie counting.  Patient was advised to ensure she is taking in adequate nutrition daily  by meeting calorie goals. -Patient was educated on dietary changes to not only lose weight but to eat healthy.  Patient was educated on glycemic index. -Patient was educated on exercise goal of 150 minutes a week (plus warm up and cool down) of cardiovascular exercise.  Patient was educated on heart rate for cardiovascular and fat  burning zones. -Patient was encouraged to maintain adequate water  consumption of at least 80-100 ounces a day, more if exercising/sweating. Start Zepbound  2.5 mg weekly injection x 4, can increase to Zepbound  5 mg weekly injection.>  [facility]  No follow-ups on file.  Reviewed expectations re: course of current medical issues. Discussed self-management of symptoms. Outlined signs and symptoms indicating need for more acute intervention. Patient verbalized understanding and all questions were answered. Patient received an After-Visit Summary.    No orders of the defined types were placed in this encounter.  No orders of the defined types were placed in this encounter.  Referral Orders  No referral(s) requested today     Note is dictated utilizing voice recognition software. Although note has been proof read prior to signing, occasional typographical errors still can be missed. If any questions arise, please do not hesitate to call for verification.   electronically signed by:  Charlies Bellini, DO  [facility] [place]    "

## 2024-05-11 NOTE — Patient Instructions (Signed)
 Return in about 6 weeks (around 06/22/2024) for Routine chronic condition follow-up.        Great to see you today.  I have refilled the medication(s) we provide.   If labs were collected or images ordered, we will inform you of  results once we have received them and reviewed. We will contact you either by echart message, or telephone call.  Please give ample time to the testing facility, and our office to run,  receive and review results. Please do not call inquiring of results, even if you can see them in your chart. We will contact you as soon as we are able. If it has been over 1 week since the test was completed, and you have not yet heard from us , then please call us .    - echart message- for normal results that have been seen by the patient already.   - telephone call: abnormal results or if patient has not viewed results in their echart.  If a referral to a specialist was entered for you, please call us  in 2 weeks if you have not heard from the specialist office to schedule.

## 2024-05-17 ENCOUNTER — Ambulatory Visit: Payer: Medicare Other | Admitting: Internal Medicine

## 2024-05-24 ENCOUNTER — Encounter: Admitting: Pulmonary Disease

## 2024-06-22 ENCOUNTER — Ambulatory Visit: Admitting: Family Medicine

## 2024-07-12 ENCOUNTER — Other Ambulatory Visit (HOSPITAL_BASED_OUTPATIENT_CLINIC_OR_DEPARTMENT_OTHER)

## 2024-10-11 ENCOUNTER — Ambulatory Visit

## 2024-12-27 ENCOUNTER — Ambulatory Visit: Admitting: Family Medicine
